# Patient Record
Sex: Male | Born: 1940 | Race: White | Hispanic: No | Marital: Married | Smoking: Never smoker
Health system: Southern US, Community
[De-identification: ages and names within clinical notes are randomized; demographics above are authoritative.]

## PROBLEM LIST (undated history)

## (undated) DIAGNOSIS — M5020 Other cervical disc displacement, unspecified cervical region: Secondary | ICD-10-CM

## (undated) DIAGNOSIS — T4145XA Adverse effect of unspecified anesthetic, initial encounter: Secondary | ICD-10-CM

## (undated) DIAGNOSIS — M545 Low back pain, unspecified: Secondary | ICD-10-CM

## (undated) DIAGNOSIS — G8929 Other chronic pain: Secondary | ICD-10-CM

## (undated) DIAGNOSIS — K219 Gastro-esophageal reflux disease without esophagitis: Secondary | ICD-10-CM

## (undated) DIAGNOSIS — M1990 Unspecified osteoarthritis, unspecified site: Secondary | ICD-10-CM

## (undated) DIAGNOSIS — G43909 Migraine, unspecified, not intractable, without status migrainosus: Secondary | ICD-10-CM

## (undated) DIAGNOSIS — I639 Cerebral infarction, unspecified: Secondary | ICD-10-CM

## (undated) DIAGNOSIS — I629 Nontraumatic intracranial hemorrhage, unspecified: Secondary | ICD-10-CM

## (undated) DIAGNOSIS — I2721 Secondary pulmonary arterial hypertension: Secondary | ICD-10-CM

## (undated) DIAGNOSIS — I4821 Permanent atrial fibrillation: Secondary | ICD-10-CM

## (undated) DIAGNOSIS — T8859XA Other complications of anesthesia, initial encounter: Secondary | ICD-10-CM

## (undated) DIAGNOSIS — I609 Nontraumatic subarachnoid hemorrhage, unspecified: Secondary | ICD-10-CM

## (undated) DIAGNOSIS — IMO0001 Reserved for inherently not codable concepts without codable children: Secondary | ICD-10-CM

## (undated) DIAGNOSIS — I10 Essential (primary) hypertension: Secondary | ICD-10-CM

## (undated) DIAGNOSIS — N200 Calculus of kidney: Secondary | ICD-10-CM

## (undated) DIAGNOSIS — E7800 Pure hypercholesterolemia, unspecified: Secondary | ICD-10-CM

## (undated) DIAGNOSIS — I340 Nonrheumatic mitral (valve) insufficiency: Secondary | ICD-10-CM

## (undated) DIAGNOSIS — H359 Unspecified retinal disorder: Secondary | ICD-10-CM

## (undated) HISTORY — PX: JOINT REPLACEMENT: SHX530

## (undated) HISTORY — DX: Nonrheumatic mitral (valve) insufficiency: I34.0

## (undated) HISTORY — DX: Secondary pulmonary arterial hypertension: I27.21

## (undated) HISTORY — DX: Permanent atrial fibrillation: I48.21

## (undated) HISTORY — PX: CARDIOVASCULAR STRESS TEST: SHX262

## (undated) HISTORY — PX: COLONOSCOPY: SHX174

---
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
## 1979-05-27 HISTORY — PX: RETINAL DETACHMENT SURGERY: SHX105

## 1979-05-27 HISTORY — PX: CATARACT EXTRACTION W/ INTRAOCULAR LENS IMPLANT: SHX1309

## 1979-05-27 HISTORY — PX: EYE MUSCLE SURGERY: SHX370

## 1989-05-26 HISTORY — PX: BLEPHAROPLASTY: SUR158

## 2000-09-25 DIAGNOSIS — I639 Cerebral infarction, unspecified: Secondary | ICD-10-CM

## 2000-09-25 HISTORY — DX: Cerebral infarction, unspecified: I63.9

## 2009-04-05 ENCOUNTER — Ambulatory Visit: Payer: Self-pay | Admitting: Gastroenterology

## 2009-08-25 DIAGNOSIS — I609 Nontraumatic subarachnoid hemorrhage, unspecified: Secondary | ICD-10-CM

## 2009-08-25 HISTORY — DX: Nontraumatic subarachnoid hemorrhage, unspecified: I60.9

## 2009-09-14 ENCOUNTER — Emergency Department: Payer: Self-pay | Admitting: Emergency Medicine

## 2009-09-22 ENCOUNTER — Ambulatory Visit: Payer: Self-pay | Admitting: Family Medicine

## 2010-04-01 ENCOUNTER — Ambulatory Visit: Payer: Self-pay | Admitting: Unknown Physician Specialty

## 2010-04-28 ENCOUNTER — Inpatient Hospital Stay: Payer: Self-pay | Admitting: Unknown Physician Specialty

## 2010-04-28 HISTORY — PX: TOTAL KNEE ARTHROPLASTY: SHX125

## 2011-03-31 ENCOUNTER — Ambulatory Visit: Payer: Self-pay | Admitting: Surgery

## 2011-04-10 ENCOUNTER — Ambulatory Visit: Payer: Self-pay | Admitting: Surgery

## 2011-04-10 HISTORY — PX: INGUINAL HERNIA REPAIR: SUR1180

## 2012-06-17 ENCOUNTER — Emergency Department: Payer: Self-pay | Admitting: Emergency Medicine

## 2012-06-25 ENCOUNTER — Emergency Department: Payer: Self-pay | Admitting: Emergency Medicine

## 2012-07-05 ENCOUNTER — Ambulatory Visit: Payer: Self-pay | Admitting: Orthopedic Surgery

## 2012-07-26 ENCOUNTER — Emergency Department: Payer: Self-pay | Admitting: Emergency Medicine

## 2012-09-09 ENCOUNTER — Encounter (HOSPITAL_COMMUNITY): Payer: Self-pay | Admitting: Pharmacy Technician

## 2012-09-09 ENCOUNTER — Other Ambulatory Visit: Payer: Self-pay | Admitting: Neurosurgery

## 2012-09-12 ENCOUNTER — Encounter (HOSPITAL_COMMUNITY)
Admission: RE | Admit: 2012-09-12 | Discharge: 2012-09-12 | Disposition: A | Discharge status: Home / Self Care | Payer: Medicare Other | Source: Ambulatory Visit | Attending: Neurosurgery | Admitting: Neurosurgery

## 2012-09-12 ENCOUNTER — Encounter (HOSPITAL_COMMUNITY)
Admission: RE | Admit: 2012-09-12 | Discharge: 2012-09-12 | Disposition: A | Discharge status: Home / Self Care | Payer: Medicare Other | Source: Ambulatory Visit | Attending: Anesthesiology | Admitting: Anesthesiology

## 2012-09-12 ENCOUNTER — Encounter (HOSPITAL_COMMUNITY): Payer: Self-pay

## 2012-09-12 HISTORY — DX: Other cervical disc displacement, unspecified cervical region: M50.20

## 2012-09-12 HISTORY — DX: Low back pain, unspecified: M54.50

## 2012-09-12 HISTORY — DX: Gastro-esophageal reflux disease without esophagitis: K21.9

## 2012-09-12 HISTORY — DX: Other chronic pain: G89.29

## 2012-09-12 HISTORY — DX: Nontraumatic intracranial hemorrhage, unspecified: I62.9

## 2012-09-12 HISTORY — DX: Other complications of anesthesia, initial encounter: T88.59XA

## 2012-09-12 HISTORY — DX: Cerebral infarction, unspecified: I63.9

## 2012-09-12 HISTORY — DX: Low back pain: M54.5

## 2012-09-12 HISTORY — DX: Essential (primary) hypertension: I10

## 2012-09-12 HISTORY — DX: Unspecified retinal disorder: H35.9

## 2012-09-12 HISTORY — DX: Calculus of kidney: N20.0

## 2012-09-12 HISTORY — DX: Adverse effect of unspecified anesthetic, initial encounter: T41.45XA

## 2012-09-12 HISTORY — DX: Unspecified osteoarthritis, unspecified site: M19.90

## 2012-09-12 LAB — BASIC METABOLIC PANEL
CO2: 28 mEq/L (ref 19–32)
Chloride: 96 mEq/L (ref 96–112)

## 2012-09-12 LAB — CBC
HCT: 40.6 % (ref 39.0–52.0)
Hemoglobin: 13.7 g/dL (ref 13.0–17.0)
MCV: 93.8 fL (ref 78.0–100.0)
RBC: 4.33 MIL/uL (ref 4.22–5.81)
WBC: 8.1 10*3/uL (ref 4.0–10.5)

## 2012-09-12 LAB — SURGICAL PCR SCREEN
MRSA, PCR: NEGATIVE
Staphylococcus aureus: POSITIVE — AB

## 2012-09-12 LAB — TYPE AND SCREEN

## 2012-09-12 LAB — ABO/RH: ABO/RH(D): O POS

## 2012-09-12 NOTE — Pre-Procedure Instructions (Signed)
19 [name]  09/12/2012   Your procedure is scheduled on: Monday September 16, 2012   Report to [facility] at 7:30 AM.  Call this number if you have problems the morning of surgery: [phone]   Remember:   Do not eat food or drink :After Midnight.    Take these medicines the morning of surgery with A SIP OF WATER: amoxicillin, tegretol, zyrtec, cardura, oxycodone, propranolol, verapamil, TEKTURNA   Do not wear jewelry, make-up or nail polish.  Do not wear lotions, powders, or perfumes.  Do not shave 48 hours prior to surgery. Men may shave face and neck.  Do not bring valuables to the hospital.  Contacts, dentures or bridgework may not be worn into surgery.  Leave suitcase in the car. After surgery it may be brought to your room.  For patients admitted to the hospital, checkout time is 11:00 AM the day of discharge.   Patients discharged the day of surgery will not be allowed to drive home.  Name and phone number of your driver: family / friend  Special Instructions: Shower using CHG 2 nights before surgery and the night before surgery.  If you shower the day of surgery use CHG.  Use special wash - you have one bottle of CHG for all showers.  You should use approximately 1/3 of the bottle for each shower.   Please read over the following fact sheets that you were given: Pain Booklet, Coughing and Deep Breathing, Blood Transfusion Information, MRSA Information and Surgical Site Infection Prevention

## 2012-09-12 NOTE — Progress Notes (Addendum)
Patient states saw Dr. Lady Gary for cardiac workup after "cardiac arrest" after receiving dye for lexiscan.  Discussed with anesthesia.  Contacted Dr. America Brown office [phone], spoke with Byrd Hesselbach, requested all cardiac records for anesthesia review.  Requested anesthesia and post op record from [facility]. Forwarded to anesthesia for review.

## 2012-09-13 ENCOUNTER — Encounter (HOSPITAL_COMMUNITY): Payer: Self-pay | Admitting: Vascular Surgery

## 2012-09-13 NOTE — Consult Note (Signed)
Anesthesia chart reviewed patient is a 71 year old male scheduled for L4-5 PLIF by Dr. Gerlene Fee on 09/16/2012. 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 He also reported that he "arrested" during his stress test at [facility] in 2011--however, when I reviewed records it only reported he was bradycardic without mention of syncope of other symptoms.   For Anesthesia History he reports severe hypotension with spinal anesthesia and hypertension with general anesthesia.  Anesthesia records from his right inguinal hernia surgery on 04/10/11 (done under [facility]) and his left TKR on 04/28/10 (done under spinal) are on his chart for review.  These were both done at [facility].    He was evaluated by Dr. Harold Hedge in July 2011 for pre-operative evaluation for TKR.  He had a nuclear stress test on 04/15/10 that showed bradycardia, normal left ventricular function with EF 56%, normal myocardial perfusion images at rest and peak stress without evidence of ischemia. He was ultimately cleared for knee replacement surgery. PRN cardiology followup was recommended at that time. (He was asymptomatic of his bradycardia at that time.)  However, the patient was seen again on 06/12/2011 for hypertension and hyperlipidemia followup. No changes were made at that time.  EKG on 09/12/12 showed SB @ 48 bpm, moderate voltage criteria for LVH, cannot rule out anteroseptal infarct (age undetermined).  The copy is dark, but from what I can tell, his EKG appears stable since at least 03/31/11 (PCP).  CXR on 09/12/12 showed COPD, no acute cardiopulmonary abnormality.  Pre-operative labs noted.  He will be evaluated by his Anesthesiologist on the day of surgery to discuss the definitive anesthesia plan.  If no significant change in his status then would anticipate he could proceed as planned.  Shonna Chock, PA-C 09/13/12 1143

## 2012-09-15 MED ORDER — CEFAZOLIN SODIUM-DEXTROSE 2-3 GM-% IV SOLR
2.0000 g | INTRAVENOUS | Status: AC
  Administered 2012-09-16: 2 g via INTRAVENOUS
  Filled 2012-09-15: qty 50

## 2012-09-16 ENCOUNTER — Inpatient Hospital Stay (HOSPITAL_COMMUNITY)
Admission: RE | Admit: 2012-09-16 | Discharge: 2012-09-18 | DRG: 460 | Disposition: A | Discharge status: Home / Self Care | Payer: Medicare Other | Source: Ambulatory Visit | Attending: Neurosurgery | Admitting: Neurosurgery

## 2012-09-16 ENCOUNTER — Encounter (HOSPITAL_COMMUNITY): Payer: Self-pay | Admitting: Vascular Surgery

## 2012-09-16 ENCOUNTER — Encounter (HOSPITAL_COMMUNITY): Payer: Self-pay | Admitting: Surgery

## 2012-09-16 ENCOUNTER — Encounter (HOSPITAL_COMMUNITY): Payer: Self-pay | Admitting: Certified Registered Nurse Anesthetist

## 2012-09-16 ENCOUNTER — Encounter (HOSPITAL_COMMUNITY)
Admission: RE | Disposition: A | Discharge status: Home / Self Care | Payer: Self-pay | Source: Ambulatory Visit | Attending: Neurosurgery

## 2012-09-16 ENCOUNTER — Ambulatory Visit (HOSPITAL_COMMUNITY): Payer: Medicare Other

## 2012-09-16 ENCOUNTER — Ambulatory Visit (HOSPITAL_COMMUNITY): Payer: Medicare Other | Admitting: Vascular Surgery

## 2012-09-16 DIAGNOSIS — I10 Essential (primary) hypertension: Secondary | ICD-10-CM | POA: Diagnosis present

## 2012-09-16 DIAGNOSIS — Z7982 Long term (current) use of aspirin: Secondary | ICD-10-CM

## 2012-09-16 DIAGNOSIS — M7130 Other bursal cyst, unspecified site: Secondary | ICD-10-CM | POA: Diagnosis present

## 2012-09-16 DIAGNOSIS — K219 Gastro-esophageal reflux disease without esophagitis: Secondary | ICD-10-CM | POA: Diagnosis present

## 2012-09-16 DIAGNOSIS — M48062 Spinal stenosis, lumbar region with neurogenic claudication: Secondary | ICD-10-CM

## 2012-09-16 DIAGNOSIS — Z79899 Other long term (current) drug therapy: Secondary | ICD-10-CM

## 2012-09-16 DIAGNOSIS — Z8673 Personal history of transient ischemic attack (TIA), and cerebral infarction without residual deficits: Secondary | ICD-10-CM

## 2012-09-16 DIAGNOSIS — M4310 Spondylolisthesis, site unspecified: Secondary | ICD-10-CM | POA: Diagnosis present

## 2012-09-16 DIAGNOSIS — M48061 Spinal stenosis, lumbar region without neurogenic claudication: Principal | ICD-10-CM | POA: Diagnosis present

## 2012-09-16 DIAGNOSIS — Z01812 Encounter for preprocedural laboratory examination: Secondary | ICD-10-CM

## 2012-09-16 HISTORY — DX: Migraine, unspecified, not intractable, without status migrainosus: G43.909

## 2012-09-16 HISTORY — PX: POSTERIOR LUMBAR FUSION: SHX6036

## 2012-09-16 HISTORY — DX: Pure hypercholesterolemia, unspecified: E78.00

## 2012-09-16 HISTORY — DX: Reserved for inherently not codable concepts without codable children: IMO0001

## 2012-09-16 SURGERY — POSTERIOR LUMBAR FUSION 1 LEVEL
Anesthesia: General | Site: Spine Lumbar | Wound class: Clean

## 2012-09-16 MED ORDER — SURGIFOAM 100 EX MISC
CUTANEOUS | Status: DC | PRN
  Administered 2012-09-16: 11:00:00 via TOPICAL

## 2012-09-16 MED ORDER — DIPHENHYDRAMINE HCL 25 MG PO CAPS
25.0000 mg | ORAL_CAPSULE | Freq: Three times a day (TID) | ORAL | Status: DC | PRN
  Administered 2012-09-16: 25 mg via ORAL
  Filled 2012-09-16: qty 1

## 2012-09-16 MED ORDER — ALBUMIN HUMAN 5 % IV SOLN
INTRAVENOUS | Status: DC | PRN
  Administered 2012-09-16: 12:00:00 via INTRAVENOUS

## 2012-09-16 MED ORDER — BUPIVACAINE HCL (PF) 0.5 % IJ SOLN
INTRAMUSCULAR | Status: DC | PRN
  Administered 2012-09-16: 20 mL

## 2012-09-16 MED ORDER — ROCURONIUM BROMIDE 100 MG/10ML IV SOLN
INTRAVENOUS | Status: DC | PRN
  Administered 2012-09-16: 50 mg via INTRAVENOUS

## 2012-09-16 MED ORDER — HYDROMORPHONE HCL PF 1 MG/ML IJ SOLN
0.2500 mg | INTRAMUSCULAR | Status: DC | PRN
  Administered 2012-09-16 (×2): 0.25 mg via INTRAVENOUS

## 2012-09-16 MED ORDER — ACETAMINOPHEN 325 MG PO TABS
650.0000 mg | ORAL_TABLET | ORAL | Status: DC | PRN
  Administered 2012-09-18: 650 mg via ORAL
  Filled 2012-09-16: qty 2

## 2012-09-16 MED ORDER — FUROSEMIDE 20 MG PO TABS
20.0000 mg | ORAL_TABLET | Freq: Every morning | ORAL | Status: DC
  Administered 2012-09-17 – 2012-09-18 (×2): 20 mg via ORAL
  Filled 2012-09-16 (×2): qty 1

## 2012-09-16 MED ORDER — WHITE PETROLATUM GEL
Status: AC
  Filled 2012-09-16: qty 5

## 2012-09-16 MED ORDER — ALUM & MAG HYDROXIDE-SIMETH 200-200-20 MG/5ML PO SUSP: 30.0000 mL | Freq: Four times a day (QID) | ORAL | Status: DC | PRN

## 2012-09-16 MED ORDER — ONDANSETRON HCL 4 MG/2ML IJ SOLN: 4.0000 mg | Freq: Four times a day (QID) | INTRAMUSCULAR | Status: DC | PRN

## 2012-09-16 MED ORDER — DEXAMETHASONE SODIUM PHOSPHATE 10 MG/ML IJ SOLN
10.0000 mg | INTRAMUSCULAR | Status: AC
  Administered 2012-09-16: 10 mg via INTRAVENOUS
  Filled 2012-09-16: qty 1

## 2012-09-16 MED ORDER — SODIUM CHLORIDE 0.9 % IV SOLN
INTRAVENOUS | Status: AC
  Filled 2012-09-16: qty 500

## 2012-09-16 MED ORDER — VERAPAMIL HCL 120 MG PO TABS
120.0000 mg | ORAL_TABLET | ORAL | Status: DC
  Administered 2012-09-16 – 2012-09-18 (×6): 120 mg via ORAL
  Filled 2012-09-16 (×9): qty 1

## 2012-09-16 MED ORDER — ALISKIREN FUMARATE 150 MG PO TABS
300.0000 mg | ORAL_TABLET | ORAL | Status: DC
  Administered 2012-09-17 – 2012-09-18 (×2): 300 mg via ORAL
  Filled 2012-09-16 (×3): qty 2

## 2012-09-16 MED ORDER — SODIUM CHLORIDE 0.9 % IJ SOLN: 3.0000 mL | INTRAMUSCULAR | Status: DC | PRN

## 2012-09-16 MED ORDER — ACETAMINOPHEN 650 MG RE SUPP: 650.0000 mg | RECTAL | Status: DC | PRN

## 2012-09-16 MED ORDER — CYCLOBENZAPRINE HCL 10 MG PO TABS: 10.0000 mg | ORAL_TABLET | Freq: Three times a day (TID) | ORAL | Status: DC | PRN

## 2012-09-16 MED ORDER — VECURONIUM BROMIDE 10 MG IV SOLR
INTRAVENOUS | Status: DC | PRN
  Administered 2012-09-16: 2 mg via INTRAVENOUS
  Administered 2012-09-16: 1 mg via INTRAVENOUS
  Administered 2012-09-16 (×2): 2 mg via INTRAVENOUS
  Administered 2012-09-16 (×2): 1 mg via INTRAVENOUS

## 2012-09-16 MED ORDER — CEFAZOLIN SODIUM-DEXTROSE 2-3 GM-% IV SOLR
2.0000 g | Freq: Three times a day (TID) | INTRAVENOUS | Status: AC
  Administered 2012-09-16 – 2012-09-17 (×2): 2 g via INTRAVENOUS
  Filled 2012-09-16 (×2): qty 50

## 2012-09-16 MED ORDER — BACITRACIN 50000 UNITS IM SOLR
INTRAMUSCULAR | Status: AC
  Filled 2012-09-16: qty 1

## 2012-09-16 MED ORDER — IRBESARTAN 75 MG PO TABS
75.0000 mg | ORAL_TABLET | Freq: Every day | ORAL | Status: DC
  Administered 2012-09-16 – 2012-09-18 (×3): 75 mg via ORAL
  Filled 2012-09-16 (×4): qty 1

## 2012-09-16 MED ORDER — SODIUM CHLORIDE 0.9 % IR SOLN
Status: DC | PRN
  Administered 2012-09-16: 11:00:00

## 2012-09-16 MED ORDER — SODIUM CHLORIDE 0.9 % IJ SOLN
3.0000 mL | Freq: Two times a day (BID) | INTRAMUSCULAR | Status: DC
  Administered 2012-09-16 – 2012-09-18 (×3): 3 mL via INTRAVENOUS

## 2012-09-16 MED ORDER — HYDROMORPHONE HCL PF 1 MG/ML IJ SOLN
INTRAMUSCULAR | Status: AC
  Filled 2012-09-16: qty 1

## 2012-09-16 MED ORDER — LACTATED RINGERS IV SOLN
INTRAVENOUS | Status: DC | PRN
  Administered 2012-09-16 (×3): via INTRAVENOUS

## 2012-09-16 MED ORDER — MENTHOL 3 MG MT LOZG: 1.0000 | LOZENGE | OROMUCOSAL | Status: DC | PRN

## 2012-09-16 MED ORDER — ZOLPIDEM TARTRATE 5 MG PO TABS: 5.0000 mg | ORAL_TABLET | Freq: Every evening | ORAL | Status: DC | PRN

## 2012-09-16 MED ORDER — GLYCOPYRROLATE 0.2 MG/ML IJ SOLN
INTRAMUSCULAR | Status: DC | PRN
  Administered 2012-09-16: 0.6 mg via INTRAVENOUS
  Administered 2012-09-16: 0.2 mg via INTRAVENOUS

## 2012-09-16 MED ORDER — DOXAZOSIN MESYLATE 1 MG PO TABS
1.0000 mg | ORAL_TABLET | Freq: Every evening | ORAL | Status: DC
  Administered 2012-09-16: 1 mg via ORAL
  Filled 2012-09-16 (×3): qty 1

## 2012-09-16 MED ORDER — ARTIFICIAL TEARS OP OINT
TOPICAL_OINTMENT | OPHTHALMIC | Status: DC | PRN
  Administered 2012-09-16: 1 via OPHTHALMIC

## 2012-09-16 MED ORDER — OXYCODONE HCL 5 MG PO TABS: 5.0000 mg | ORAL_TABLET | Freq: Once | ORAL | Status: DC | PRN

## 2012-09-16 MED ORDER — AZELASTINE HCL 0.1 % NA SOLN
2.0000 | Freq: Two times a day (BID) | NASAL | Status: DC | PRN
  Administered 2012-09-16: 2 via NASAL
  Filled 2012-09-16: qty 30

## 2012-09-16 MED ORDER — KCL IN DEXTROSE-NACL 20-5-0.45 MEQ/L-%-% IV SOLN
80.0000 mL/h | INTRAVENOUS | Status: DC
  Administered 2012-09-16: 80 mL/h via INTRAVENOUS
  Filled 2012-09-16 (×5): qty 1000

## 2012-09-16 MED ORDER — PROPRANOLOL HCL 40 MG PO TABS
40.0000 mg | ORAL_TABLET | Freq: Three times a day (TID) | ORAL | Status: DC
  Administered 2012-09-16 – 2012-09-18 (×6): 40 mg via ORAL
  Filled 2012-09-16 (×9): qty 1

## 2012-09-16 MED ORDER — PHENOL 1.4 % MT LIQD: 1.0000 | OROMUCOSAL | Status: DC | PRN

## 2012-09-16 MED ORDER — OXYCODONE HCL 5 MG/5ML PO SOLN: 5.0000 mg | Freq: Once | ORAL | Status: DC | PRN

## 2012-09-16 MED ORDER — LIDOCAINE HCL (CARDIAC) 20 MG/ML IV SOLN
INTRAVENOUS | Status: DC | PRN
  Administered 2012-09-16: 70 mg via INTRAVENOUS

## 2012-09-16 MED ORDER — PROPOFOL 10 MG/ML IV BOLUS
INTRAVENOUS | Status: DC | PRN
  Administered 2012-09-16: 100 mg via INTRAVENOUS

## 2012-09-16 MED ORDER — EPHEDRINE SULFATE 50 MG/ML IJ SOLN
INTRAMUSCULAR | Status: DC | PRN
  Administered 2012-09-16 (×3): 15 mg via INTRAVENOUS

## 2012-09-16 MED ORDER — 0.9 % SODIUM CHLORIDE (POUR BTL) OPTIME
TOPICAL | Status: DC | PRN
  Administered 2012-09-16: 1000 mL

## 2012-09-16 MED ORDER — NEOSTIGMINE METHYLSULFATE 1 MG/ML IJ SOLN
INTRAMUSCULAR | Status: DC | PRN
  Administered 2012-09-16: 4 mg via INTRAVENOUS

## 2012-09-16 MED ORDER — HYDROMORPHONE HCL PF 1 MG/ML IJ SOLN
1.0000 mg | INTRAMUSCULAR | Status: DC | PRN
  Administered 2012-09-16 (×2): 1 mg via INTRAMUSCULAR
  Administered 2012-09-16: 1.5 mg via INTRAMUSCULAR
  Filled 2012-09-16: qty 1
  Filled 2012-09-16: qty 2
  Filled 2012-09-16: qty 1

## 2012-09-16 MED ORDER — PANTOPRAZOLE SODIUM 40 MG IV SOLR
40.0000 mg | Freq: Every day | INTRAVENOUS | Status: DC
  Administered 2012-09-16: 40 mg via INTRAVENOUS
  Filled 2012-09-16 (×2): qty 40

## 2012-09-16 MED ORDER — CARBAMAZEPINE 200 MG PO TABS
200.0000 mg | ORAL_TABLET | Freq: Every morning | ORAL | Status: DC
  Administered 2012-09-17 – 2012-09-18 (×2): 200 mg via ORAL
  Filled 2012-09-16 (×2): qty 1

## 2012-09-16 MED ORDER — SIMVASTATIN 20 MG PO TABS
20.0000 mg | ORAL_TABLET | ORAL | Status: DC
Start: 2012-09-16 — End: 2012-09-18
  Administered 2012-09-16 – 2012-09-18 (×4): 20 mg via ORAL
  Filled 2012-09-16 (×6): qty 1

## 2012-09-16 MED ORDER — MIDAZOLAM HCL 5 MG/5ML IJ SOLN
INTRAMUSCULAR | Status: DC | PRN
  Administered 2012-09-16: 2 mg via INTRAVENOUS

## 2012-09-16 MED ORDER — ONDANSETRON HCL 4 MG/2ML IJ SOLN: 4.0000 mg | INTRAMUSCULAR | Status: DC | PRN

## 2012-09-16 MED ORDER — MORPHINE SULFATE 10 MG/ML IJ SOLN
INTRAMUSCULAR | Status: DC | PRN
  Administered 2012-09-16 (×2): 2 mg via INTRAVENOUS

## 2012-09-16 MED ORDER — HYDROCODONE-ACETAMINOPHEN 5-325 MG PO TABS
1.0000 | ORAL_TABLET | ORAL | Status: DC | PRN
  Administered 2012-09-17 – 2012-09-18 (×7): 2 via ORAL
  Filled 2012-09-16 (×7): qty 2

## 2012-09-16 MED ORDER — SODIUM CHLORIDE 0.9 % IV SOLN: 250.0000 mL | INTRAVENOUS | Status: DC

## 2012-09-16 MED ORDER — DEXAMETHASONE SODIUM PHOSPHATE 10 MG/ML IJ SOLN: INTRAMUSCULAR | Status: DC | PRN

## 2012-09-16 MED ORDER — ONDANSETRON HCL 4 MG/2ML IJ SOLN
INTRAMUSCULAR | Status: DC | PRN
  Administered 2012-09-16: 4 mg via INTRAVENOUS

## 2012-09-16 SURGICAL SUPPLY — 65 items
BAG DECANTER FOR FLEXI CONT (MISCELLANEOUS) ×2 IMPLANT
BENZOIN TINCTURE PRP APPL 2/3 (GAUZE/BANDAGES/DRESSINGS) ×4 IMPLANT
BLADE SURG ROTATE 9660 (MISCELLANEOUS) ×2 IMPLANT
BONE EQUIVA 5CC (Bone Implant) ×4 IMPLANT
BRUSH SCRUB EZ PLAIN DRY (MISCELLANEOUS) ×2 IMPLANT
CANISTER SUCTION 2500CC (MISCELLANEOUS) ×2 IMPLANT
CLOTH BEACON ORANGE TIMEOUT ST (SAFETY) ×2 IMPLANT
CONT SPEC 4OZ CLIKSEAL STRL BL (MISCELLANEOUS) ×6 IMPLANT
COVER BACK TABLE 24X17X13 BIG (DRAPES) IMPLANT
COVER TABLE BACK 60X90 (DRAPES) ×2 IMPLANT
DERMABOND ADHESIVE PROPEN (GAUZE/BANDAGES/DRESSINGS) ×2
DERMABOND ADVANCED (GAUZE/BANDAGES/DRESSINGS)
DERMABOND ADVANCED .7 DNX12 (GAUZE/BANDAGES/DRESSINGS) IMPLANT
DERMABOND ADVANCED .7 DNX6 (GAUZE/BANDAGES/DRESSINGS) ×2 IMPLANT
DRAPE C-ARM 42X72 X-RAY (DRAPES) ×6 IMPLANT
DRAPE LAPAROTOMY 100X72X124 (DRAPES) ×2 IMPLANT
DRAPE SURG 17X23 STRL (DRAPES) ×4 IMPLANT
DRESSING TELFA 8X3 (GAUZE/BANDAGES/DRESSINGS) IMPLANT
ELECT REM PT RETURN 9FT ADLT (ELECTROSURGICAL) ×2
ELECTRODE REM PT RTRN 9FT ADLT (ELECTROSURGICAL) ×1 IMPLANT
EVACUATOR 1/8 PVC DRAIN (DRAIN) ×2 IMPLANT
GAUZE SPONGE 4X4 16PLY XRAY LF (GAUZE/BANDAGES/DRESSINGS) IMPLANT
GLOVE BIO SURGEON STRL SZ 6.5 (GLOVE) ×2 IMPLANT
GLOVE BIOGEL PI IND STRL 7.0 (GLOVE) ×5 IMPLANT
GLOVE BIOGEL PI INDICATOR 7.0 (GLOVE) ×5
GLOVE ECLIPSE 6.5 STRL STRAW (GLOVE) ×2 IMPLANT
GLOVE ECLIPSE 7.5 STRL STRAW (GLOVE) ×6 IMPLANT
GLOVE EXAM NITRILE LRG STRL (GLOVE) IMPLANT
GLOVE EXAM NITRILE MD LF STRL (GLOVE) IMPLANT
GLOVE EXAM NITRILE XS STR PU (GLOVE) IMPLANT
GLOVE SS BIOGEL STRL SZ 6.5 (GLOVE) ×4 IMPLANT
GLOVE SUPERSENSE BIOGEL SZ 6.5 (GLOVE) ×4
GOWN BRE IMP SLV AUR LG STRL (GOWN DISPOSABLE) ×8 IMPLANT
GOWN BRE IMP SLV AUR XL STRL (GOWN DISPOSABLE) ×4 IMPLANT
GOWN STRL REIN 2XL LVL4 (GOWN DISPOSABLE) IMPLANT
KIT BASIN OR (CUSTOM PROCEDURE TRAY) ×2 IMPLANT
KIT ROOM TURNOVER OR (KITS) ×2 IMPLANT
LUCENT STRAIGHT 10X22X10 (Set) ×4 IMPLANT
NEEDLE HYPO 22GX1.5 SAFETY (NEEDLE) ×2 IMPLANT
NS IRRIG 1000ML POUR BTL (IV SOLUTION) ×2 IMPLANT
PACK LAMINECTOMY NEURO (CUSTOM PROCEDURE TRAY) ×2 IMPLANT
PAD ARMBOARD 7.5X6 YLW CONV (MISCELLANEOUS) ×10 IMPLANT
PATTIES SURGICAL .75X.75 (GAUZE/BANDAGES/DRESSINGS) IMPLANT
ROD PATHFINDER 40MM (Rod) ×2 IMPLANT
ROD PATHFINDER PERC .45MM (Rod) ×2 IMPLANT
SCREW MIN INVASIVE 6.5X45 (Screw) ×4 IMPLANT
SCREW POLYAXIA MIS 6.5X40MM (Screw) ×4 IMPLANT
SPECIMEN JAR SMALL (MISCELLANEOUS) ×2 IMPLANT
SPONGE GAUZE 4X4 12PLY (GAUZE/BANDAGES/DRESSINGS) ×2 IMPLANT
SPONGE LAP 4X18 X RAY DECT (DISPOSABLE) IMPLANT
SPONGE SURGIFOAM ABS GEL 100 (HEMOSTASIS) ×2 IMPLANT
STRIP CLOSURE SKIN 1/2X4 (GAUZE/BANDAGES/DRESSINGS) ×4 IMPLANT
SUT VIC AB 0 CT1 18XCR BRD8 (SUTURE) ×1 IMPLANT
SUT VIC AB 0 CT1 8-18 (SUTURE) ×1
SUT VIC AB 2-0 OS6 18 (SUTURE) ×6 IMPLANT
SUT VIC AB 3-0 CP2 18 (SUTURE) ×2 IMPLANT
SYR 20ML ECCENTRIC (SYRINGE) ×2 IMPLANT
TOOL FLUTED BALL 7MM (MISCELLANEOUS) ×2 IMPLANT
TOOL MATCHSTK 3MM (MISCELLANEOUS) ×2 IMPLANT
TOP CLSR SEQUOIA (Orthopedic Implant) ×8 IMPLANT
TOWEL OR 17X24 6PK STRL BLUE (TOWEL DISPOSABLE) ×2 IMPLANT
TOWEL OR 17X26 10 PK STRL BLUE (TOWEL DISPOSABLE) ×2 IMPLANT
TRAP SPECIMEN MUCOUS 40CC (MISCELLANEOUS) ×2 IMPLANT
TRAY FOLEY CATH 14FRSI W/METER (CATHETERS) ×2 IMPLANT
WATER STERILE IRR 1000ML POUR (IV SOLUTION) ×2 IMPLANT

## 2012-09-16 NOTE — Transfer of Care (Signed)
Immediate Anesthesia Transfer of Care Note  Patient: [name]  Procedure(s) Performed: Procedure(s) (LRB) with comments: POSTERIOR LUMBAR FUSION 1 LEVEL (N/A) - Lumbar Four-Five, Posterior Lumbar Interbody Fusion with Pedicle Screws  Patient Location: PACU  Anesthesia Type:General  Level of Consciousness: sedated  Airway & Oxygen Therapy: Patient Spontanous Breathing and Patient connected to nasal cannula oxygen  Post-op Assessment: Report given to PACU RN and Post -op Vital signs reviewed and stable  Post vital signs: Reviewed and stable  Complications: No apparent anesthesia complications

## 2012-09-16 NOTE — Anesthesia Preprocedure Evaluation (Addendum)
Anesthesia Evaluation  Patient identified by MRN, date of birth, ID band Patient awake    Reviewed: Allergy & Precautions, H&P , NPO status , Patient's Chart, lab work & pertinent test results  Airway Mallampati: II  Neck ROM: full    Dental  (+) Teeth Intact and Dental Advisory Given   Pulmonary          Cardiovascular hypertension,     Neuro/Psych  Headaches, Seizures -,     GI/Hepatic GERD-  ,  Endo/Other    Renal/GU      Musculoskeletal  (+) Arthritis -,   Abdominal   Peds  Hematology   Anesthesia Other Findings   Reproductive/Obstetrics                          Anesthesia Physical Anesthesia Plan  ASA: III  Anesthesia Plan: General   Post-op Pain Management:    Induction: Intravenous  Airway Management Planned: Oral ETT  Additional Equipment:   Intra-op Plan:   Post-operative Plan: Extubation in OR  Informed Consent: I have reviewed the patients History and Physical, chart, labs and discussed the procedure including the risks, benefits and alternatives for the proposed anesthesia with the patient or authorized representative who has indicated his/her understanding and acceptance.     Plan Discussed with: CRNA and Surgeon  Anesthesia Plan Comments:         Anesthesia Quick Evaluation

## 2012-09-16 NOTE — Preoperative (Addendum)
Beta Blockers   Reason not to administer Beta Blockers:Not Applicable 

## 2012-09-16 NOTE — H&P (Signed)
[name] is an 71 y.o. male.   Chief Complaint: Back and right greater than left leg pain HPI: The patient is a 71 year old gentleman who presented with back and bilateral leg pain worse on the right than the left. He was tried on aggressive conservative therapy without improvement. He underwent an MRI scan of his lumbar spine which showed spondylolisthesis and stenosis at L4-5. After failing further conservative therapy and discussing the options the patient requested surgery and now comes for an L4-5 decompression with interbody fusion and pedicle screw fixation. I've had a long discussion with him regarding the risks and benefits of surgical intervention. The risks discussed include but are not limited to bleeding infection weakness numbness paralysis 12 instrumentation nonunion spinal fluid leakage coma and death. We have discussed alternative methods of therapy offered risks and benefits of nonintervention. He's had the opportunity to ask numerous questions and appears to understand. With this information in hand he has requested that we proceed with surgery.  Past Medical History  Diagnosis Date  . Stroke     2002  . Hypertension     sees Dr. Jerl Mina, [facility] [place]  . Kidney stones     hx of  . GERD (gastroesophageal reflux disease)   . Headache     migranies, hx of  . Arthritis   . Chronic low back pain     hx of  . Cataract     right eye  . Retina disorder     hx of torn retina  . Seizures     "last seizures in the 1980's, controlled with tegratol  . Herniated disc, cervical   . Intracranial bleed     december 2010  . Complication of anesthesia     "cardiac arrest with dye from lexiscan [04/15/10 records indicate bradycardia, no ischemia, EF 56%], unstable BP with anesthesia[    Past Surgical History  Procedure Date  . Hernia repair     04/10/2011  . Joint replacement     left knee, 04/28/10  . Colonoscopy     06/26/09  . Blepharoplasty     right  eye, also had  "muscle surgery to eye"  . Cardiovascular stress test     nl perfusion w/o ischemia, EF 56%, bradycardia without syncope 04/15/10 (Dr. Harold Hedge)    History reviewed. No pertinent family history. Social History:  reports that he has never smoked. He does not have any smokeless tobacco history on file. He reports that he does not drink alcohol or use illicit drugs.  Allergies:  Allergies  Allergen Reactions  . Lexiscan (Regadenoson)     Created cardiac arrest  . Codeine Nausea Only  . Fentanyl     Given before knee surgery, blood pressure became extremely low    Medications Prior to Admission  Medication Sig Dispense Refill  . aliskiren (TEKTURNA) 300 MG tablet Take 300 mg by mouth every morning. Time-specific, takes at 0900.      Marland Kitchen amoxicillin (AMOXIL) 500 MG capsule Take 2,000 mg by mouth once as needed. Takes 4 capsules 30 minutes prior to dental/surgical procedures.      Marland Kitchen aspirin 325 MG tablet Take 325 mg by mouth every evening. Time-specific, takes at 1700.      . carbamazepine (TEGRETOL) 200 MG tablet Take 200 mg by mouth every morning. Time-specific, takes at 0900.      . cetirizine (ZYRTEC) 10 MG tablet Take 10 mg by mouth daily.      Marland Kitchen  doxazosin (CARDURA) 1 MG tablet Take 1 mg by mouth every evening. Time-specific, takes at 1700.      . furosemide (LASIX) 20 MG tablet Take 20 mg by mouth every morning. Time-specific, takes at 0900.      . ibuprofen (ADVIL,MOTRIN) 200 MG tablet Take 200 mg by mouth every 4 (four) hours as needed. For pain.  Takes along with scheduled Percocet dose.      . oxyCODONE-acetaminophen (PERCOCET/ROXICET) 5-325 MG per tablet Take 1 tablet by mouth every 4 (four) hours.      . pantoprazole (PROTONIX) 40 MG tablet Take 40 mg by mouth every evening. Time-specific, takes at 1700.      Marland Kitchen propranolol (INDERAL) 80 MG tablet Take 40 mg by mouth 3 (three) times daily. Time-specific, takes at 0900, 1700, and 2300.      . simvastatin (ZOCOR) 20  MG tablet Take 20 mg by mouth 2 (two) times daily. Time-specific, takes at 0900 and 1700.      . valsartan (DIOVAN) 320 MG tablet Take 320 mg by mouth every morning. Time-specific, takes at 0900.      Marland Kitchen verapamil (CALAN) 120 MG tablet Take 120 mg by mouth 3 (three) times daily. Time-specific, takes at 0900, 1700, and 2300.        No results found for this or any previous visit (from the past 48 hour(s)). No results found.  Review of systems not obtained due to patient factors.  Blood pressure 129/64, pulse 45, temperature 97.7 F (36.5 C), temperature source Oral, resp. rate 18, SpO2 97.00%.  The patient is awake alert and oriented. His no facial asymmetry. His gait is slow mildly antalgic. Strength is mildly decreased of extensor pollicis longus for sensation is intact Assessment/Plan Impression is that of stenosis and listhesis at L4-5 the plan is for an L4-5 decompression with posterior lumbar interbody fusion and instrumentation.  Reinaldo Meeker, MD 09/16/2012, 10:07 AM

## 2012-09-16 NOTE — Op Note (Signed)
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 Localizing fluoroscopy was used prior to incision to identify the appropriate level. Midline incision was made above the spinous processes of L3-L4 and L5. Using Bovie cutting current the incision was carried on the spinous processes. Set periosteal dissection was then carried out bilaterally on the spinous processes lamina facet joint and the far lateral region to identify the transverse processes of L4 and L5. Self-retaining tract was placed for exposure and x-ray showed approach the appropriate level. Spinous processes of L4 and L5 were removed. Using the high-speed drill the inferior 80% of the L4 lamina the medial three quarters of the facet joint the superior one third of the L5 lamina were removed. Residual bone and ligamentum flavum removed in a piecemeal fashion. Similar decompression was carried on the opposite side. When this was complete residual bone and ligamentum flavum removed in a piecemeal fashion. Large synovial cyst was identified on the right attached the dura this was dissected free without difficulty and then removed in a piecemeal fashion. At this time the L5 nerve roots the thecal sac were found to be well decompressed. We then incised a 15 blade and thoroughly cleaned out with pituitary rongeurs and curettes. We then placed pedicle screws bilaterally at L4-5 with Pathfinder system. We used a drill hole entry points and then passed the pedicle all through the pedicle from  lateral to medial direction. We then tapped with the 6.0 mm tap and placed 6.5 x 45 mm screws at L. 4 and 6.5 x 40 mm screws at L5. We then did interbody fusion in standard fashion. We distracted the disc space up to a 10 mm size and used a rotating cutter. We then impacted 10 mm cage filled with autologous bone morselized allograft. We followed this in excellent position. At the same thing on the opposite side. Prior to placing the second spacer mixture of autologous bone morselized allograft was placed the midline to help with interbody fusion. High-speed drill was then used to decorticate the far lateral region for posterolateral fusion was performed with autologous bone morselized allograft. We then chose appropriate length rods and secured them to stop the screws and did tightening and final tightening with torque and counter torque. We then placed Gelfoam over the dura and then placed an epidural drain which was brought through separate stab incision. Final fluoroscopy with excellent in AP lateral direction. The was then closed in multiple layers of Vicryl on the muscle fascia subcutaneous and subcuticular tissues. Dermabond Steri-Strips were placed on the skin. Shortness was then applied the patient was extubated and covered stable condition.

## 2012-09-16 NOTE — Anesthesia Postprocedure Evaluation (Signed)
Anesthesia Post Note  Patient: [name]  Procedure(s) Performed: Procedure(s) (LRB): POSTERIOR LUMBAR FUSION 1 LEVEL (N/A)  Anesthesia type: General  Patient location: PACU  Post pain: Pain level controlled and Adequate analgesia  Post assessment: Post-op Vital signs reviewed, Patient's Cardiovascular Status Stable, Respiratory Function Stable, Patent Airway and Pain level controlled  Last Vitals:  Filed Vitals:   09/16/12 1449  BP:   Pulse: 67  Temp: 36.2 C  Resp: 14    Post vital signs: Reviewed and stable  Level of consciousness: awake, alert  and oriented  Complications: No apparent anesthesia complications

## 2012-09-16 NOTE — Progress Notes (Signed)
Pt. Very sleepy, wakes up and states his back hurts then goes back to sleep, DIlaudid given , when asked if it helped he stated he didn't know and went back to sleep.

## 2012-09-17 ENCOUNTER — Encounter (HOSPITAL_COMMUNITY): Payer: Self-pay | Admitting: General Practice

## 2012-09-17 MED ORDER — PANTOPRAZOLE SODIUM 40 MG PO TBEC
40.0000 mg | DELAYED_RELEASE_TABLET | Freq: Every day | ORAL | Status: DC
  Administered 2012-09-17 – 2012-09-18 (×2): 40 mg via ORAL
  Filled 2012-09-17: qty 1

## 2012-09-17 NOTE — Progress Notes (Signed)
Patient ID: [name], male   DOB: [birth year], 71 y.o.   MRN: [id number] Afebrile, VSS No leg pain. Increasing activity.  Plan is for d/c tomorrow if continues to do well.

## 2012-09-17 NOTE — Progress Notes (Signed)
Pt ambulating with walker , brace and person assist  hemo vac still draining a lot   Total output 180 this shift endorsed to 2nd shift Rn to D/c hemo vac per MD order if drainage is decreased.

## 2012-09-17 NOTE — Progress Notes (Signed)
UR completed 

## 2012-09-18 MED ORDER — OXYCODONE HCL 5 MG PO TABS: 15.0000 mg | ORAL_TABLET | ORAL | Status: DC | PRN

## 2012-09-18 NOTE — Discharge Summary (Signed)
Physician Discharge Summary  Patient ID: [name] MRN: [id number] DOB/AGE: Apr 03, 1941 71 y.o.  Admit date: 09/16/2012 Discharge date: 09/18/2012  Admission Diagnoses:  Discharge Diagnoses:  Active Problems:  * No active hospital problems. *    Discharged Condition: good  Hospital Course: Surgery Monday with lumbar fusion. Did well. Pain much better. Increased activity without difficulty. Wound healing fine. Home POD 2, specific instructions given.  Consults: None  Significant Diagnostic Studies: none  Treatments: surgery: L 45 plif  Discharge Exam: Blood pressure 159/70, pulse 64, temperature 98.3 F (36.8 C), temperature source Oral, resp. rate 18, height 6" (0.152 m), weight 97.523 kg (215 lb), SpO2 100.00%. Incision/Wound:healing well.  Disposition: Final discharge disposition not confirmed  Discharge Orders    Future Orders Please Complete By Expires   Diet general      Discharge instructions      Comments:   Mostly bedrest. Get up 9 or 10 times each day and walk for 15-20 minutes each time. Very little sitting the first week. No riding in the car until your first post op appointment. If you had neck surgery...may shower from the chest down. If you had low back surgery....you may shower with a saran wrap covering over the incision. Take your pain medicine as needed...and other medicines that you are instructed to take. Call for an appointment...[phone].   Call MD for:  temperature >100.4      Call MD for:  persistant nausea and vomiting      Call MD for:  severe uncontrolled pain      Call MD for:  redness, tenderness, or signs of infection (pain, swelling, redness, odor or green/yellow discharge around incision site)      Call MD for:  difficulty breathing, headache or visual disturbances      Call MD for:  hives          Medication List     As of 09/18/2012 10:37 AM    STOP taking these medications         amoxicillin 500 MG capsule   Commonly  known as: AMOXIL      aspirin 325 MG tablet      ibuprofen 200 MG tablet   Commonly known as: ADVIL,MOTRIN      oxyCODONE-acetaminophen 5-325 MG per tablet   Commonly known as: PERCOCET/ROXICET      TAKE these medications         aliskiren 300 MG tablet   Commonly known as: TEKTURNA   Take 300 mg by mouth every morning. Time-specific, takes at 0900.      carbamazepine 200 MG tablet   Commonly known as: TEGRETOL   Take 200 mg by mouth every morning. Time-specific, takes at 0900.      cetirizine 10 MG tablet   Commonly known as: ZYRTEC   Take 10 mg by mouth daily.      doxazosin 1 MG tablet   Commonly known as: CARDURA   Take 1 mg by mouth every evening. Time-specific, takes at 1700.      furosemide 20 MG tablet   Commonly known as: LASIX   Take 20 mg by mouth every morning. Time-specific, takes at 0900.      oxyCODONE 5 MG immediate release tablet   Commonly known as: Oxy IR/ROXICODONE   Take 3 tablets (15 mg total) by mouth every 4 (four) hours as needed for pain.      pantoprazole 40 MG tablet   Commonly known as: PROTONIX  Take 40 mg by mouth every evening. Time-specific, takes at 1700.      propranolol 80 MG tablet   Commonly known as: INDERAL   Take 40 mg by mouth 3 (three) times daily. Time-specific, takes at 0900, 1700, and 2300.      simvastatin 20 MG tablet   Commonly known as: ZOCOR   Take 20 mg by mouth 2 (two) times daily. Time-specific, takes at 0900 and 1700.      verapamil 120 MG tablet   Commonly known as: CALAN   Take 120 mg by mouth 3 (three) times daily. Time-specific, takes at 0900, 1700, and 2300.      ASK your doctor about these medications         valsartan 320 MG tablet   Commonly known as: DIOVAN   Take 320 mg by mouth every morning. Time-specific, takes at 0900.         At home rest most of the time. Get up 9 or 10 times each day and take a 15 or 20 minute walk. No riding in the car and to your first postoperative appointment.  If you have neck surgery you may shower from the chest down starting on the third postoperative day. If you had back surgery he may start showering on the third postoperative day with saran wrap wrapped around your incisional area 3 times. After the shower remove the saran wrap. Take pain medicine as needed and other medications as instructed. Call my office for an appointment.  SignedReinaldo Meeker, MD 09/18/2012, 10:37 AM

## 2012-09-18 NOTE — Progress Notes (Signed)
Pt dc instructions provided. Pt verbalized understanding. Iv dc intact. Pt under no s/s distress. 

## 2014-08-18 ENCOUNTER — Ambulatory Visit: Payer: Self-pay | Admitting: Neurosurgery

## 2015-11-01 ENCOUNTER — Other Ambulatory Visit: Payer: Self-pay | Admitting: Neurology

## 2015-11-01 DIAGNOSIS — R2689 Other abnormalities of gait and mobility: Secondary | ICD-10-CM

## 2015-11-01 DIAGNOSIS — R42 Dizziness and giddiness: Secondary | ICD-10-CM

## 2015-11-23 ENCOUNTER — Ambulatory Visit
Admission: RE | Admit: 2015-11-23 | Discharge: 2015-11-23 | Disposition: A | Discharge status: Home / Self Care | Payer: Medicare Other | Source: Ambulatory Visit | Attending: Neurology | Admitting: Neurology

## 2015-11-23 DIAGNOSIS — M216X9 Other acquired deformities of unspecified foot: Secondary | ICD-10-CM | POA: Diagnosis not present

## 2015-11-23 DIAGNOSIS — G319 Degenerative disease of nervous system, unspecified: Secondary | ICD-10-CM | POA: Insufficient documentation

## 2015-11-23 DIAGNOSIS — R2689 Other abnormalities of gait and mobility: Secondary | ICD-10-CM

## 2015-11-23 DIAGNOSIS — R42 Dizziness and giddiness: Secondary | ICD-10-CM | POA: Diagnosis not present

## 2015-11-23 DIAGNOSIS — R260 Ataxic gait: Secondary | ICD-10-CM | POA: Insufficient documentation

## 2015-11-23 DIAGNOSIS — R278 Other lack of coordination: Secondary | ICD-10-CM | POA: Diagnosis present

## 2015-11-23 DIAGNOSIS — I739 Peripheral vascular disease, unspecified: Secondary | ICD-10-CM | POA: Insufficient documentation

## 2019-01-27 ENCOUNTER — Other Ambulatory Visit: Payer: Self-pay

## 2019-01-27 ENCOUNTER — Emergency Department
Admission: EM | Admit: 2019-01-27 | Discharge: 2019-01-27 | Disposition: A | Discharge status: Home / Self Care | Payer: Medicare Other | Attending: Student in an Organized Health Care Education/Training Program | Admitting: Student in an Organized Health Care Education/Training Program

## 2019-01-27 ENCOUNTER — Emergency Department: Payer: Medicare Other

## 2019-01-27 DIAGNOSIS — Z7982 Long term (current) use of aspirin: Secondary | ICD-10-CM | POA: Diagnosis not present

## 2019-01-27 DIAGNOSIS — M25552 Pain in left hip: Secondary | ICD-10-CM | POA: Insufficient documentation

## 2019-01-27 DIAGNOSIS — Z96652 Presence of left artificial knee joint: Secondary | ICD-10-CM | POA: Diagnosis not present

## 2019-01-27 DIAGNOSIS — M5432 Sciatica, left side: Secondary | ICD-10-CM | POA: Diagnosis not present

## 2019-01-27 DIAGNOSIS — Z79899 Other long term (current) drug therapy: Secondary | ICD-10-CM | POA: Insufficient documentation

## 2019-01-27 DIAGNOSIS — I10 Essential (primary) hypertension: Secondary | ICD-10-CM | POA: Insufficient documentation

## 2019-01-27 LAB — BASIC METABOLIC PANEL
Anion gap: 10 (ref 5–15)
BUN: 12 mg/dL (ref 8–23)
CO2: 26 mmol/L (ref 22–32)
Calcium: 8.9 mg/dL (ref 8.9–10.3)
Chloride: 100 mmol/L (ref 98–111)
Creatinine, Ser: 0.78 mg/dL (ref 0.61–1.24)
GFR calc Af Amer: >60 mL/min (ref >60)
GFR calc non Af Amer: >60 mL/min (ref >60)
Glucose, Bld: 131 mg/dL — ABNORMAL HIGH (ref 70–99)
Potassium: 4.1 mmol/L (ref 3.5–5.1)
Sodium: 136 mmol/L (ref 135–145)

## 2019-01-27 LAB — CBC WITH DIFFERENTIAL/PLATELET
Abs Immature Granulocytes: 0.02 10*3/uL (ref 0.00–0.07)
Basophils Absolute: 0 10*3/uL (ref 0.0–0.1)
Basophils Relative: 0 %
Eosinophils Absolute: 0.1 10*3/uL (ref 0.0–0.5)
Eosinophils Relative: 1 %
HCT: 39.8 % (ref 39.0–52.0)
Hemoglobin: 13.6 g/dL (ref 13.0–17.0)
Immature Granulocytes: 0 %
Lymphocytes Relative: 20 %
Lymphs Abs: 1.5 10*3/uL (ref 0.7–4.0)
MCH: 32.4 pg (ref 26.0–34.0)
MCHC: 34.2 g/dL (ref 30.0–36.0)
MCV: 94.8 fL (ref 80.0–100.0)
Monocytes Absolute: 0.7 10*3/uL (ref 0.1–1.0)
Monocytes Relative: 8 %
Neutro Abs: 5.5 10*3/uL (ref 1.7–7.7)
Neutrophils Relative %: 71 %
Platelets: 167 10*3/uL (ref 150–400)
RBC: 4.2 MIL/uL — ABNORMAL LOW (ref 4.22–5.81)
RDW: 12.4 % (ref 11.5–15.5)
WBC: 7.9 10*3/uL (ref 4.0–10.5)
nRBC: 0 % (ref 0.0–0.2)

## 2019-01-27 MED ORDER — DEXAMETHASONE SODIUM PHOSPHATE 10 MG/ML IJ SOLN
10.0000 mg | Freq: Once | INTRAMUSCULAR | Status: AC
  Administered 2019-01-27: 08:00:00 10 mg via INTRAVENOUS
  Filled 2019-01-27: qty 1

## 2019-01-27 MED ORDER — TRAMADOL HCL 50 MG PO TABS: 50.0000 mg | ORAL_TABLET | Freq: Four times a day (QID) | ORAL | 0 refills | Status: DC | PRN

## 2019-01-27 MED ORDER — NAPROXEN 500 MG PO TABS: 500.0000 mg | ORAL_TABLET | Freq: Two times a day (BID) | ORAL | 2 refills | Status: DC

## 2019-01-27 MED ORDER — ONDANSETRON HCL 4 MG/2ML IJ SOLN
4.0000 mg | Freq: Once | INTRAMUSCULAR | Status: AC
Start: 2019-01-27 — End: 2019-01-27
  Administered 2019-01-27: 4 mg via INTRAVENOUS
  Filled 2019-01-27: qty 2

## 2019-01-27 MED ORDER — MORPHINE SULFATE (PF) 2 MG/ML IV SOLN
2.0000 mg | Freq: Once | INTRAVENOUS | Status: AC
  Administered 2019-01-27: 2 mg via INTRAVENOUS
  Filled 2019-01-27: qty 1

## 2019-01-27 MED ORDER — MORPHINE SULFATE (PF) 2 MG/ML IV SOLN
2.0000 mg | INTRAVENOUS | Status: DC | PRN
  Administered 2019-01-27: 05:00:00 2 mg via INTRAVENOUS
  Filled 2019-01-27: qty 1

## 2019-01-27 NOTE — ED Triage Notes (Signed)
Pt states fell two weeks ago injuring left hip.pt states he now has pain from left groin extending to posterior hip and down lateral left leg to mid calf.

## 2019-01-27 NOTE — ED Notes (Signed)
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 Pt attempted to stand and walk x 2 but was unsuccessful in ambulating. MD made aware.

## 2019-01-27 NOTE — ED Provider Notes (Signed)
Reviewed CT results with the patient.  He has absolutely no pain in the right groin or right flank despite what appears to be a right UVJ stone.  Additionally he has a cyst in his groin which is been there for many years, it is nontender non-erythematous and not the source of his pain.  He describes significant pain when he AB ducts at the left hip.  CT lumbar spine shows arthritis and some stenosis but no obvious nerve impingement.  No broken bones.  Suspect sciatica versus muscle spasm as he describes pain that radiates down past his left knee with particular movements.  We will treat with IV Decadron   ----------------------------------------- 8:18 AM on 01/27/2019 -----------------------------------------  Patient continues to have pain in his left hip but is able to stand, typically he ambulates with a cane or walker.  Offered admission for pain control but he states that he would prefer to go home he does agree to come back if his symptoms worsen.  He will follow-up with Ortho.   Jene Every, MD 01/27/19 [phone]

## 2019-01-27 NOTE — ED Notes (Signed)
Patient to xray.

## 2019-01-27 NOTE — ED Notes (Signed)
Pt returned from xray

## 2019-01-27 NOTE — ED Notes (Signed)
Pt resting in bed, reports pain 7/10 in left hip but pt states he would like to wait for the results of his scan before getting more pain medication. No other acute concerns.

## 2019-01-27 NOTE — ED Notes (Signed)
Pt in NAD at time of departure, A&OX4, , this RN wheeled pt to spouse for transport. This RN gave spouse discharge instructions and education per pt consent. Pt and family verbalize d/c understanding and have no further questions regarding this visit.

## 2019-01-27 NOTE — ED Notes (Signed)
Spouse called at this time for ride. Will be here shortly she states

## 2019-01-27 NOTE — ED Notes (Signed)
Patient transported to CT 

## 2019-01-27 NOTE — ED Provider Notes (Signed)
[facility] Emergency Department Provider Note    First MD Initiated Contact with Patient 01/27/19 [phone]     (approximate)  I have reviewed the triage vital signs and the nursing notes.   HISTORY  Chief Complaint Hip Pain    HPI [name] is a 78 y.o. male below listed past medical history not currently on any anticoagulation presents to the ER for right and left hip and groin pain.  States he had a mechanical fall 2 weeks ago landing on his left hip area nausea since then but was having achiness and discomfort shooting down his left leg.  Denies any low back pain.  States that last night he was bending forward to brush his teeth and felt sudden worsening of the pain.  States the pain wraps around to the back of his buttock.  Denies any fevers.  Denies any dysuria or hematuria.    Past Medical History:  Diagnosis Date  . Arthritis    "lower back on down; into all joints; into my feet" (09/17/2012)  . Chronic low back pain    hx of  . Complication of anesthesia    "cardiac arrest with dye from lexiscan [04/15/10 records indicate bradycardia, no ischemia, EF 56%], unstable BP with anesthesia[  . GERD (gastroesophageal reflux disease)   . 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 Herniated disc, cervical   . Hypercholesteremia   . Hypertension    sees Dr. Jerl [facility]inaJames Hedrick, [facility] [place]  . Intracranial bleed    december 2010  . Kidney stones    "passed on their own" (09/17/2012)  . Migraines    "2 in my life" (09/17/2012)  . Retina disorder    hx of torn retina  . Stroke 2002   LLE "just a little bit weaker" (09/17/2012)   No family history on file. Past Surgical History:  Procedure Laterality Date  . BLEPHAROPLASTY  1990's   "right eye" (09/16/2012)  . CARDIOVASCULAR STRESS TEST     nl perfusion w/o ischemia, EF 56%, bradycardia without syncope 04/15/10 (Dr. Harold HedgeKenneth Fath)  . CATARACT EXTRACTION W/ INTRAOCULAR LENS  IMPLANT  1980's   "right" (09/17/2012)  . COLONOSCOPY     06/26/09  . EYE MUSCLE SURGERY  1980's   "right eye" (09/17/2012)  . INGUINAL HERNIA REPAIR  04/10/2011   "right" (09/17/2012)  . JOINT REPLACEMENT    . POSTERIOR LUMBAR FUSION  09/16/2012   "L4-5" (09/17/2012)  . RETINAL DETACHMENT SURGERY  1980's   "right" (09/17/2012)  . 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     Prior to Admission medications   Medication Sig Start Date End Date Taking? Authorizing Provider  aliskiren (TEKTURNA) 300 MG tablet Take 300 mg by mouth every morning. Time-specific, takes at 0900.   Yes [provider]  aspirin 325 MG tablet Take 325 mg by mouth daily.   Yes [provider]  carbamazepine (TEGRETOL) 200 MG tablet Take 200 mg by mouth every morning. Time-specific, takes at 0900.   Yes [provider]  cetirizine (ZYRTEC) 10 MG tablet Take 10 mg by mouth daily.   Yes [provider]  doxazosin (CARDURA) 1 MG tablet Take 1 mg by mouth every evening. Time-specific, takes at 1700.   Yes [provider]  furosemide (LASIX) 20 MG tablet Take 20 mg by mouth every morning. Time-specific, takes  at 0900.   Yes [provider]  irbesartan (AVAPRO) 300 MG tablet Take 300 mg by mouth daily. 11/19/17  Yes [provider]  Omega-3 Fatty Acids (FISH OIL) 1200 MG CAPS Take 1,200 mg by mouth daily.   Yes [provider]  pantoprazole (PROTONIX) 40 MG tablet Take 40 mg by mouth every evening. Time-specific, takes at 1700.   Yes [provider]  simvastatin (ZOCOR) 20 MG tablet Take 20 mg by mouth 2 (two) times daily. Time-specific, takes at 0900 and 1700.   Yes [provider]  verapamil (CALAN) 120 MG tablet Take 120 mg by mouth 3 (three) times daily. Time-specific, takes at 0900, 1700, and 2300.   Yes [provider]  vitamin B-12  (CYANOCOBALAMIN) 1000 MCG tablet Take 1,000 mcg by mouth daily.   Yes [provider]  propranolol (INDERAL) 80 MG tablet Take 40 mg by mouth 3 (three) times daily. Time-specific, takes at 0900, 1700, and 2300.    [provider]    Allergies Codeine; Fentanyl; and Lexiscan [regadenoson]    Social History Social History   Tobacco Use  . Smoking status: Never Smoker  . Smokeless tobacco: Never Used  Substance Use Topics  . Alcohol use: No  . Drug use: No    Review of Systems Patient denies headaches, rhinorrhea, blurry vision, numbness, shortness of breath, chest pain, edema, cough, abdominal pain, nausea, vomiting, diarrhea, dysuria, fevers, rashes or hallucinations unless otherwise stated above in HPI. ____________________________________________   PHYSICAL EXAM:  VITAL SIGNS: Vitals:   01/27/19 0608 01/27/19 0615  BP: (!) 162/97   Pulse: 65 69  Resp:    Temp:    SpO2: 96% 97%    Constitutional: Alert and oriented.  Eyes: Conjunctivae are normal.  Head: Atraumatic. Nose: No congestion/rhinnorhea. Mouth/Throat: Mucous membranes are moist.   Neck: No stridor. Painless ROM.  Cardiovascular: Normal rate, regular rhythm. Grossly normal heart sounds.  Good peripheral circulation. Respiratory: Normal respiratory effort.  No retractions. Lungs CTAB. Gastrointestinal: Soft and nontender. No distention. No abdominal bruits. No CVA tenderness. Genitourinary: Normal external genitalia.  No hernia. Musculoskeletal: Contusion to the left gluteal muscle gluteal compartments soft.  There is some discomfort with external rotation.  No shortening.  Neurovascular intact distally.  No lower extremity tenderness nor edema.  No joint effusions. Neurologic:  Normal speech and language. No gross focal neurologic deficits are appreciated. No facial droop Skin:  Skin is warm, dry and intact. No rash noted. Psychiatric: Mood and affect are normal. Speech and behavior are  normal.  ____________________________________________   LABS (all labs ordered are listed, but only abnormal results are displayed)  Results for orders placed or performed during the hospital encounter of 01/27/19 (from the past 24 hour(s))  CBC with Differential/Platelet     Status: Abnormal   Collection Time: 01/27/19  5:41 AM  Result Value Ref Range   WBC 7.9 4.0 - 10.5 K/uL   RBC 4.20 (L) 4.22 - 5.81 MIL/uL   Hemoglobin 13.6 13.0 - 17.0 g/dL   HCT 40.9 81.1 - 91.4 %   MCV 94.8 80.0 - 100.0 fL   MCH 32.4 26.0 - 34.0 pg   MCHC 34.2 30.0 - 36.0 g/dL   RDW 78.2 95.6 - 21.3 %   Platelets 167 150 - 400 K/uL   nRBC 0.0 0.0 - 0.2 %   Neutrophils Relative % 71 %   Neutro Abs 5.5 1.7 - 7.7 K/uL   Lymphocytes Relative 20 %  Lymphs Abs 1.5 0.7 - 4.0 K/uL   Monocytes Relative 8 %   Monocytes Absolute 0.7 0.1 - 1.0 K/uL   Eosinophils Relative 1 %   Eosinophils Absolute 0.1 0.0 - 0.5 K/uL   Basophils Relative 0 %   Basophils Absolute 0.0 0.0 - 0.1 K/uL   Immature Granulocytes 0 %   Abs Immature Granulocytes 0.02 0.00 - 0.07 K/uL  Basic metabolic panel     Status: Abnormal   Collection Time: 01/27/19  5:41 AM  Result Value Ref Range   Sodium 136 135 - 145 mmol/L   Potassium 4.1 3.5 - 5.1 mmol/L   Chloride 100 98 - 111 mmol/L   CO2 26 22 - 32 mmol/L   Glucose, Bld 131 (H) 70 - 99 mg/dL   BUN 12 8 - 23 mg/dL   Creatinine, Ser 9.02 0.61 - 1.24 mg/dL   Calcium 8.9 8.9 - 11.1 mg/dL   GFR calc non Af Amer >60 >60 mL/min   GFR calc Af Amer >60 >60 mL/min   Anion gap 10 5 - 15   ____________________________________________ ____________________________________________  RADIOLOGY  I personally reviewed all radiographic images ordered to evaluate for the above acute complaints and reviewed radiology reports and findings.  These findings were personally discussed with the patient.  Please see medical record for radiology report.  ____________________________________________    PROCEDURES  Procedure(s) performed:  Procedures    Critical Care performed: no ____________________________________________   INITIAL IMPRESSION / ASSESSMENT AND PLAN / ED COURSE  Pertinent labs & imaging results that were available during my care of the patient were reviewed by me and considered in my medical decision making (see chart for details).   DDX: contusion, fracture, dislocation, arthritis  Ciro E Siedschlag is a 78 y.o. who presents to the ED with left hip pain after mechanical fall.  No evidence of head injury.  Thoracic spine abdominal exam is reassuring.  Not currently on any anticoagulation.  No indication for diagnostic neurovascular intact distally.  Will provide IV pain medication, check labs as well as a  Clinical Course as of Jan 26 700  Mon Jan 27, 2019  5520 X-ray reported as negative.  I am still concerned for possible fracture particular given the patient's pain.  Remains neuro intact.  Will order CT imaging to further characterize.   [PR]    Clinical Course User Index [PR] Willy Eddy, MD   Patient will be signed out to oncoming physician pending ct results and reassessment. The patient was evaluated in Emergency Department today for the symptoms described in the history of present illness. He/she was evaluated in the context of the global COVID-19 pandemic, which necessitated consideration that the patient might be at risk for infection with the SARS-CoV-2 virus that causes COVID-19. Institutional protocols and algorithms that pertain to the evaluation of patients at risk for COVID-19 are in a state of rapid change based on information released by regulatory bodies including the CDC and federal and state organizations. These policies and algorithms were followed during the patient's care in the ED.  As part of my medical decision making, I reviewed the following data within the electronic MEDICAL RECORD NUMBER Nursing notes reviewed and incorporated, Labs  reviewed, notes from prior ED visits and  Controlled Substance Database   ____________________________________________   FINAL CLINICAL IMPRESSION(S) / ED DIAGNOSES  Final diagnoses:  Hip pain, acute, left      NEW MEDICATIONS STARTED DURING THIS VISIT:  New Prescriptions   No medications  on file     Note:  This document was prepared using Dragon voice recognition software and may include unintentional dictation errors.    Willy Eddy, MD 01/27/19 [phone]

## 2019-01-30 ENCOUNTER — Emergency Department
Admission: EM | Admit: 2019-01-30 | Discharge: 2019-01-30 | Disposition: A | Discharge status: Home / Self Care | Payer: Medicare Other | Attending: Student in an Organized Health Care Education/Training Program | Admitting: Student in an Organized Health Care Education/Training Program

## 2019-01-30 ENCOUNTER — Emergency Department: Payer: Medicare Other

## 2019-01-30 ENCOUNTER — Other Ambulatory Visit: Payer: Self-pay

## 2019-01-30 DIAGNOSIS — S73192A Other sprain of left hip, initial encounter: Secondary | ICD-10-CM | POA: Diagnosis not present

## 2019-01-30 DIAGNOSIS — Y939 Activity, unspecified: Secondary | ICD-10-CM | POA: Insufficient documentation

## 2019-01-30 DIAGNOSIS — Z96652 Presence of left artificial knee joint: Secondary | ICD-10-CM | POA: Diagnosis not present

## 2019-01-30 DIAGNOSIS — X58XXXA Exposure to other specified factors, initial encounter: Secondary | ICD-10-CM | POA: Diagnosis not present

## 2019-01-30 DIAGNOSIS — Y929 Unspecified place or not applicable: Secondary | ICD-10-CM | POA: Insufficient documentation

## 2019-01-30 DIAGNOSIS — Z1159 Encounter for screening for other viral diseases: Secondary | ICD-10-CM | POA: Diagnosis not present

## 2019-01-30 DIAGNOSIS — Z79899 Other long term (current) drug therapy: Secondary | ICD-10-CM | POA: Insufficient documentation

## 2019-01-30 DIAGNOSIS — Z7982 Long term (current) use of aspirin: Secondary | ICD-10-CM | POA: Insufficient documentation

## 2019-01-30 DIAGNOSIS — S8992XA Unspecified injury of left lower leg, initial encounter: Secondary | ICD-10-CM | POA: Diagnosis present

## 2019-01-30 DIAGNOSIS — M5416 Radiculopathy, lumbar region: Secondary | ICD-10-CM | POA: Insufficient documentation

## 2019-01-30 DIAGNOSIS — Y999 Unspecified external cause status: Secondary | ICD-10-CM | POA: Insufficient documentation

## 2019-01-30 DIAGNOSIS — I10 Essential (primary) hypertension: Secondary | ICD-10-CM | POA: Insufficient documentation

## 2019-01-30 LAB — COMPREHENSIVE METABOLIC PANEL
ALT: 17 U/L (ref 0–44)
AST: 21 U/L (ref 15–41)
Albumin: 3.7 g/dL (ref 3.5–5.0)
Alkaline Phosphatase: 82 U/L (ref 38–126)
Anion gap: 8 (ref 5–15)
BUN: 17 mg/dL (ref 8–23)
CO2: 26 mmol/L (ref 22–32)
Calcium: 9.2 mg/dL (ref 8.9–10.3)
Chloride: 102 mmol/L (ref 98–111)
Creatinine, Ser: 0.75 mg/dL (ref 0.61–1.24)
GFR calc Af Amer: >60 mL/min (ref >60)
GFR calc non Af Amer: >60 mL/min (ref >60)
Glucose, Bld: 104 mg/dL — ABNORMAL HIGH (ref 70–99)
Potassium: 4.1 mmol/L (ref 3.5–5.1)
Sodium: 136 mmol/L (ref 135–145)
Total Bilirubin: 0.8 mg/dL (ref 0.3–1.2)
Total Protein: 6.4 g/dL — ABNORMAL LOW (ref 6.5–8.1)

## 2019-01-30 LAB — CBC WITH DIFFERENTIAL/PLATELET
Abs Immature Granulocytes: 0.03 10*3/uL (ref 0.00–0.07)
Basophils Absolute: 0 10*3/uL (ref 0.0–0.1)
Basophils Relative: 0 %
Eosinophils Absolute: 0.2 10*3/uL (ref 0.0–0.5)
Eosinophils Relative: 3 %
HCT: 39.8 % (ref 39.0–52.0)
Hemoglobin: 13.8 g/dL (ref 13.0–17.0)
Immature Granulocytes: 0 %
Lymphocytes Relative: 16 %
Lymphs Abs: 1.3 10*3/uL (ref 0.7–4.0)
MCH: 32.7 pg (ref 26.0–34.0)
MCHC: 34.7 g/dL (ref 30.0–36.0)
MCV: 94.3 fL (ref 80.0–100.0)
Monocytes Absolute: 0.6 10*3/uL (ref 0.1–1.0)
Monocytes Relative: 7 %
Neutro Abs: 6 10*3/uL (ref 1.7–7.7)
Neutrophils Relative %: 74 %
Platelets: 163 10*3/uL (ref 150–400)
RBC: 4.22 MIL/uL (ref 4.22–5.81)
RDW: 12.6 % (ref 11.5–15.5)
WBC: 8.2 10*3/uL (ref 4.0–10.5)
nRBC: 0 % (ref 0.0–0.2)

## 2019-01-30 LAB — CARBAMAZEPINE LEVEL, TOTAL: Carbamazepine Lvl: 6.8 ug/mL (ref 4.0–12.0)

## 2019-01-30 MED ORDER — VERAPAMIL HCL 120 MG PO TABS
120.0000 mg | ORAL_TABLET | Freq: Three times a day (TID) | ORAL | Status: DC
  Administered 2019-01-30 (×2): 120 mg via ORAL
  Filled 2019-01-30 (×4): qty 1

## 2019-01-30 MED ORDER — FAMOTIDINE 20 MG PO TABS: 20.0000 mg | ORAL_TABLET | Freq: Every day | ORAL | 0 refills | Status: DC

## 2019-01-30 MED ORDER — MORPHINE SULFATE (PF) 2 MG/ML IV SOLN
2.0000 mg | INTRAVENOUS | Status: DC | PRN
  Administered 2019-01-30: 15:00:00 2 mg via INTRAVENOUS
  Filled 2019-01-30: qty 1

## 2019-01-30 MED ORDER — HYDROCODONE-ACETAMINOPHEN 5-325 MG PO TABS
1.0000 | ORAL_TABLET | Freq: Once | ORAL | Status: AC
  Administered 2019-01-30: 09:00:00 1 via ORAL
  Filled 2019-01-30: qty 1

## 2019-01-30 MED ORDER — HYDROCODONE-ACETAMINOPHEN 5-325 MG PO TABS: 1.0000 | ORAL_TABLET | ORAL | 0 refills | Status: DC | PRN

## 2019-01-30 MED ORDER — PREDNISONE 10 MG PO TABS: 10.0000 mg | ORAL_TABLET | Freq: Every day | ORAL | 0 refills | Status: DC

## 2019-01-30 NOTE — TOC Progression Note (Signed)
Transition of Care [facility]) - Progression Note    Patient Details  Name: [name] MRN: [id number] Date of Birth: [birth year]  [facility] CM/SW Contact  Tania Caden Fukushima, LCSW Phone Number: 01/30/2019, 12:31 PM  Clinical Narrative:    12:23pm - CSW spoke to pt's wife [name] [phone]) and shared that pt may be going to SNF. Pt's wife shared that she would prefer him to go home with home health due to her not having great experiences at SNFs. Pt's wife shared that she is okay with seeing what PT recommends, and seeing what final decision pt makes. CSW will keep wife updated.   CSW requested EDP order COVID test in case he goes to SNF.   Expected Discharge Plan: Skilled Nursing Facility Barriers to Discharge: Continued Medical Work up  Expected Discharge Plan and Services Expected Discharge Plan: Skilled Nursing Facility In-house Referral: Clinical Social Work Discharge Planning Services: CM Consult Post Acute Care Choice: Skilled Nursing Facility Living arrangements for the past 2 months: Single Family Home                                       Social Determinants of Health (SDOH) Interventions    Readmission Risk Interventions No flowsheet data found.

## 2019-01-30 NOTE — NC FL2 (Signed)
MEDICAID FL2 LEVEL OF CARE SCREENING TOOL     IDENTIFICATION  Patient Name: [name] Birthdate: 1940-12-05 Sex: male Admission Date (Current Location): 01/30/2019  [facility] and [place] Number:  Chiropodist and Address:  [facility], [address] [place], [place] [address]      Provider Number: [id number]  Attending Physician Name and Address:  Willy Eddy, MD  Relative Name and Phone Number:  Keymari Zuege; spouse; [phone]    Current Level of Care: SNF Recommended Level of Care: Skilled Nursing Facility Prior Approval Number:    Date Approved/Denied:   PASRR Number: [id number]  Discharge Plan: SNF    Current Diagnoses: There are no active problems to display for this patient.   Orientation RESPIRATION BLADDER Height & Weight     Self, Time, Situation, Place  Normal Continent Weight: 220 lb (99.8 kg) Height:  6' (182.9 cm)  BEHAVIORAL SYMPTOMS/MOOD NEUROLOGICAL BOWEL NUTRITION STATUS      Continent Diet  AMBULATORY STATUS COMMUNICATION OF NEEDS Skin   Extensive Assist Verbally Normal                       Personal Care Assistance Level of Assistance  Bathing, Feeding, Dressing Bathing Assistance: Limited assistance Feeding assistance: Independent Dressing Assistance: Limited assistance     Functional Limitations Info  Sight, Hearing, Speech Sight Info: Adequate Hearing Info: Adequate Speech Info: Adequate    SPECIAL CARE FACTORS FREQUENCY  OT (By licensed OT), PT (By licensed PT)     PT Frequency: 5 times a week OT Frequency: 5 times a week             Contractures Contractures Info: Not present    Additional Factors Info  Code Status Code Status Info: Full             Current Medications (01/30/2019):  This is the current hospital active medication list Current Facility-Administered Medications  Medication Dose Route Frequency Provider Last Rate Last Dose  .  morphine 2 MG/ML injection 2 mg  2 mg Intravenous Q2H PRN Willy Eddy, MD      . verapamil (CALAN) tablet 120 mg  120 mg Oral Q8H Willy Eddy, MD   120 mg at 01/30/19 6203   Current Outpatient Medications  Medication Sig Dispense Refill  . aliskiren (TEKTURNA) 300 MG tablet Take 300 mg by mouth daily.     Marland Kitchen aspirin 325 MG tablet Take 325 mg by mouth daily.    . carbamazepine (TEGRETOL) 200 MG tablet Take 200 mg by mouth 2 (two) times a day.     . cetirizine (ZYRTEC) 10 MG tablet Take 10 mg by mouth daily as needed for allergies.     Marland Kitchen doxazosin (CARDURA) 1 MG tablet Take 1 mg by mouth at bedtime.     . furosemide (LASIX) 20 MG tablet Take 20 mg by mouth daily.     . irbesartan (AVAPRO) 300 MG tablet Take 300 mg by mouth daily.    . naproxen (NAPROSYN) 500 MG tablet Take 1 tablet (500 mg total) by mouth 2 (two) times daily with a meal. 20 tablet 2  . Omega-3 Fatty Acids (FISH OIL) 1200 MG CAPS Take 1,200 mg by mouth daily.    . pantoprazole (PROTONIX) 40 MG tablet Take 40 mg by mouth at bedtime.     . propranolol (INDERAL) 80 MG tablet Take 40 mg by mouth 3 (three) times daily.     Marland Kitchen  simvastatin (ZOCOR) 20 MG tablet Take 20 mg by mouth 2 (two) times daily.     . traMADol (ULTRAM) 50 MG tablet Take 1 tablet (50 mg total) by mouth every 6 (six) hours as needed. (Patient taking differently: Take 50 mg by mouth every 6 (six) hours. ) 10 tablet 0  . verapamil (CALAN) 120 MG tablet Take 120 mg by mouth 3 (three) times daily.     . vitamin B-12 (CYANOCOBALAMIN) 1000 MCG tablet Take 1,000 mcg by mouth daily.       Discharge Medications: Please see discharge summary for a list of discharge medications.  Relevant Imaging Results:  Relevant Lab Results:   Additional Information SSN - [id number]  Tania Nereida Schepp, LCSW

## 2019-01-30 NOTE — ED Notes (Signed)
Patient transported to X-ray 

## 2019-01-30 NOTE — ED Notes (Signed)
Responded to call bell. Pt needed to urinate. Rolled bed to commode and patient was able to stand and pivot to commode. Pt provided self peri care and returned to bed. Pt resting comfortably.

## 2019-01-30 NOTE — TOC Initial Note (Signed)
Transition of Care [facility]) - Initial/Assessment Note    Patient Details  Name: [name] MRN: [id number] Date of Birth: Jan 18, 1941  Transition of Care [facility]) CM/SW Contact:    Cala Bradford, LCSW Phone Number: 01/30/2019, 8:56 AM  Clinical Narrative:                 Patient is a 78 year old male who presents to the hospital due to leg pain. Patient reported that his hip started hurting on Sunday and "kept getting worse." Pt reports that he is currently unable to walk. Pt states that he current lives with his wife, [name] and gave verbal permission to CSW to speak with her. Pt is oriented x4.  Pt shared that he would be open to going to SNF if it was recommended by physical therapy, and did not have a preference on which facility he wanted to go to. Pt provided permission for CSW to begin bed search.   Expected Discharge Plan: Skilled Nursing Facility Barriers to Discharge: Continued Medical Work up   Patient Goals and CMS Choice Patient states their goals for this hospitalization and ongoing recovery are:: To be able to walk       Expected Discharge Plan and Services Expected Discharge Plan: Skilled Nursing Facility In-house Referral: Clinical Social Work Discharge Planning Services: CM Consult Post Acute Care Choice: Skilled Nursing Facility Living arrangements for the past 2 months: Single Family Home                                      Prior Living Arrangements/Services Living arrangements for the past 2 months: Single Family Home Lives with:: Spouse(Wife, [name]) Patient language and need for interpreter reviewed:: No Do you feel safe going back to the place where you live?: Yes      Need for Family Participation in Patient Care: No (Comment) Care giver support system in place?: No (comment)   Criminal Activity/Legal Involvement Pertinent to Current Situation/Hospitalization: No - Comment as needed  Activities of Daily Living      Permission  Sought/Granted Permission sought to share information with : Family Supports Permission granted to share information with : Yes, Verbal Permission Granted  Share Information with NAME: [name]      Permission granted to share info w Relationship: Spouse  Permission granted to share info w Contact Information: Gala [place],  [phone]   Emotional Assessment Appearance:: Appears stated age Attitude/Demeanor/Rapport: Engaged, Gracious Affect (typically observed): Calm, Stable Orientation: : Oriented to Self, Oriented to Place, Oriented to  Time, Oriented to Situation Alcohol / Substance Use: Never Used Psych Involvement: No (comment)  Admission diagnosis:  leg pain There are no active problems to display for this patient.  PCP:  Jerl Mina, MD Pharmacy:   [facility] [address] [place] - [address] [place] [address] Phone: [phone] Fax: [phone]     Social Determinants of Health (SDOH) Interventions    Readmission Risk Interventions No flowsheet data found.

## 2019-01-30 NOTE — Progress Notes (Signed)
PT Cancellation Note  Patient Details Name: [name] MRN: [id number] DOB: November 03, 1940   Cancelled Treatment:    Reason Eval/Treat Not Completed: Other (comment)(PT reviewed chart, awaiting L hip/lumbar spine MRI results. PT will follow up as able.)  [name] PT, DPT 1:26 PM,01/30/19 [phone]

## 2019-01-30 NOTE — ED Triage Notes (Signed)
Pt from home with c/o left leg and hip pain x 5 days via [facility]. Pt evaluated 5/4 in ED with no significant findings. No new injuries. Per EMS hx of knee replacement. EMS reports pt increasingly unable to walk due to pain. EMS reports per pt. That he slid down 2x due to inability to bear weight.

## 2019-01-30 NOTE — TOC Transition Note (Signed)
Transition of Care [facility]) - CM/SW Discharge Note   Patient Details  Name: [name] MRN: [id number] Date of Birth: 10/17/1940  [facility] CM/SW Contact:  Tania Adrin Julian, LCSW Phone Number: 01/30/2019, 3:59 PM   Clinical Narrative:    PT was unable to see pt due to his blood pressure being too high.  Pt's wife and pt preferred to go home with Home Health services for PT. EDP shared that pt is medically cleared for discharge, and will send him home with wheelchair. Pt shared that he has necessary DME at home.   Pt was provided with list of home health agencies, and chose [facility]. [facility] at [facility] was provided with pt's name, date of birth, and phone number.  CSW spoke with pt's wife, and she was informed of pt's decision, and information regarding home health services.   Final next level of care: Home w Home Health Services Barriers to Discharge: No Barriers Identified   Patient Goals and CMS Choice Patient states their goals for this hospitalization and ongoing recovery are:: To go home with home health  CMS Medicare.gov Compare Post Acute Care list provided to:: Patient Choice offered to / list presented to : Patient, Spouse  Discharge Placement                  Name of family member notified: [name]; spouse Patient and family notified of of transfer: 01/30/19  Discharge Plan and Services In-house Referral: Clinical Social Work Discharge Planning Services: CM Consult Post Acute Care Choice: Skilled Nursing Facility                    HH Arranged: PT [facility] Agency: [facility] Date HH Agency Contacted: 01/30/19 Time HH Agency Contacted: 1544 Representative spoke with at [facility] Agency: Feliberto Gottron  Social Determinants of Health (SDOH) Interventions     Readmission Risk Interventions No flowsheet data found.

## 2019-01-30 NOTE — ED Notes (Signed)
Pt c/o "pain meds not working." MD Roxan Hockey notified.

## 2019-01-30 NOTE — ED Notes (Signed)
Patient transported to MRI 

## 2019-01-30 NOTE — ED Provider Notes (Signed)
[facility] Emergency Department Provider Note    None    (approximate)  I have reviewed the triage vital signs and the nursing notes.   HISTORY  Chief Complaint Leg Pain    HPI [name] is a 78 y.o. male below listed past medical history presents the ER for worsening of left knee pain.  Denies any interval falls since last being seen but is now having difficulty bearing weight due to severe pain in his left knee.  Describes the pain is aching and constant.  Worsened with movement.  Denies any new back pain.  No abdominal pain.  No nausea or vomiting.  No fevers.    Past Medical History:  Diagnosis Date  . Arthritis    "lower back on down; into all joints; into my feet" (10/09/2012)  . Chronic low back pain    hx of  . Complication of anesthesia    "cardiac arrest with dye from lexiscan [04/15/10 records indicate bradycardia, no ischemia, EF 56%], unstable BP with anesthesia[  . GERD (gastroesophageal reflux disease)   . 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 Herniated disc, cervical   . Hypercholesteremia   . Hypertension    sees Dr. Jerl Mina, [facility] in [place]  . Intracranial bleed [facility])    december 2010  . Kidney stones    "passed on their own" (Oct 09, 2012)  . Migraines    "2 in my life" (10-09-2012)  . Retina disorder    hx of torn retina  . Stroke (HCC) 2002   LLE "just a little bit weaker" (2012-10-09)   No family history on file. Past Surgical History:  Procedure Laterality Date  . BLEPHAROPLASTY  1990's   "right eye" (09/16/2012)  . CARDIOVASCULAR STRESS TEST     nl perfusion w/o ischemia, EF 56%, bradycardia without syncope 04/15/10 (Dr. Harold Hedge)  . CATARACT EXTRACTION W/ INTRAOCULAR LENS IMPLANT  1980's   "right" (10/09/12)  . COLONOSCOPY     06/26/09  . EYE MUSCLE SURGERY  1980's   "right eye" (Oct 09, 2012)  . INGUINAL HERNIA REPAIR  04/10/2011   "right" (Oct 09, 2012)  . JOINT  REPLACEMENT    . POSTERIOR LUMBAR FUSION  09/16/2012   "L4-5" (10/09/12)  . RETINAL DETACHMENT SURGERY  1980's   "right" (2012-10-09)  . TOTAL KNEE ARTHROPLASTY  04/28/2010   "left" (Oct 09, 2012)   There are no active problems to display for this patient.     Prior to Admission medications   Medication Sig Start Date End Date Taking? Authorizing Provider  aliskiren (TEKTURNA) 300 MG tablet Take 300 mg by mouth daily.    Yes [provider]  aspirin 325 MG tablet Take 325 mg by mouth daily.   Yes [provider]  carbamazepine (TEGRETOL) 200 MG tablet Take 200 mg by mouth 2 (two) times a day.    Yes [provider]  cetirizine (ZYRTEC) 10 MG tablet Take 10 mg by mouth daily as needed for allergies.    Yes [provider]  doxazosin (CARDURA) 1 MG tablet Take 1 mg by mouth at bedtime.    Yes [provider]  furosemide (LASIX) 20 MG tablet Take 20 mg by mouth daily.    Yes [provider]  irbesartan (AVAPRO) 300 MG tablet Take 300 mg by mouth daily. 11/19/17  Yes [provider]  naproxen (NAPROSYN) 500 MG tablet Take 1 tablet (500 mg total) by mouth 2 (two) times daily  with a meal. 01/27/19  Yes Jene Every, MD  Omega-3 Fatty Acids (FISH OIL) 1200 MG CAPS Take 1,200 mg by mouth daily.   Yes [provider]  pantoprazole (PROTONIX) 40 MG tablet Take 40 mg by mouth at bedtime.    Yes [provider]  propranolol (INDERAL) 80 MG tablet Take 40 mg by mouth 3 (three) times daily.    Yes [provider]  simvastatin (ZOCOR) 20 MG tablet Take 20 mg by mouth 2 (two) times daily.    Yes [provider]  traMADol (ULTRAM) 50 MG tablet Take 1 tablet (50 mg total) by mouth every 6 (six) hours as needed. Patient taking differently: Take 50 mg by mouth every 6 (six) hours.  01/27/19 01/27/20 Yes Willy Eddy, MD  verapamil (CALAN) 120 MG tablet Take 120 mg by mouth 3 (three) times daily.    Yes [provider]  vitamin B-12 (CYANOCOBALAMIN) 1000 MCG tablet Take 1,000 mcg by mouth daily.   Yes [provider]  famotidine (PEPCID) 20 MG tablet Take 1 tablet (20 mg total) by mouth daily. 01/30/19 01/30/20  Willy Eddy, MD  HYDROcodone-acetaminophen (NORCO) 5-325 MG tablet Take 1 tablet by mouth every 4 (four) hours as needed for moderate pain. 01/30/19   Willy Eddy, MD  predniSONE (DELTASONE) 10 MG tablet Take 1 tablet (10 mg total) by mouth daily. Day 1-2: Take 50 mg  ( 5 pills) Day 3-4 : Take 40 mg (4pills) Day 5-6: Take 30 mg (3 pills) Day 7-8:  Take 20 mg (2 pills) Day 9:  Take  (1 pill) 01/30/19   Willy Eddy, MD    Allergies Codeine; Fentanyl; and Lexiscan [regadenoson]    Social History Social History   Tobacco Use  . Smoking status: Never Smoker  . Smokeless tobacco: Never Used  Substance Use Topics  . Alcohol use: No  . Drug use: No    Review of Systems Patient denies headaches, rhinorrhea, blurry vision, numbness, shortness of breath, chest pain, edema, cough, abdominal pain, nausea, vomiting, diarrhea, dysuria, fevers, rashes or hallucinations unless otherwise stated above in HPI. ____________________________________________   PHYSICAL EXAM:  VITAL SIGNS: Vitals:   01/30/19 1100 01/30/19 1351  BP: (!) 165/83 (!) 188/90  Pulse: 66 60  Resp:  18  Temp:    SpO2: 96% 96%    Constitutional: Alert and oriented.  Eyes: Conjunctivae are normal.  Head: Atraumatic. Nose: No congestion/rhinnorhea. Mouth/Throat: Mucous membranes are moist.   Neck: No stridor. Painless ROM.  Cardiovascular: Normal rate, regular rhythm. Grossly normal heart sounds.  Good peripheral circulation. Respiratory: Normal respiratory effort.  No retractions. Lungs CTAB. Gastrointestinal: Soft and nontender. No distention. No abdominal bruits. No CVA tenderness. Genitourinary:  Musculoskeletal: There is soft non-tense effusion to the left knee.  No patellar  tenderness to palpation.  No overlying warmth or erythema.  No pain with logroll of the left hip.  Pain is worsened by internal rotation of left leg.  N/V intact distally. No lower extremity tenderness nor edema.  No joint effusions. Neurologic:  Normal speech and language. No gross focal neurologic deficits are appreciated. No facial droop Skin:  Skin is warm, dry and intact. No rash noted. Psychiatric: Mood and affect are normal. Speech and behavior are normal.  ____________________________________________   LABS (all labs ordered are listed, but only abnormal results are displayed)  Results for orders placed or performed during the hospital encounter of 01/30/19 (from the past 24 hour(s))  CBC with Differential/Platelet  Status: None   Collection Time: 01/30/19  8:30 AM  Result Value Ref Range   WBC 8.2 4.0 - 10.5 K/uL   RBC 4.22 4.22 - 5.81 MIL/uL   Hemoglobin 13.8 13.0 - 17.0 g/dL   HCT 60.6 30.1 - 60.1 %   MCV 94.3 80.0 - 100.0 fL   MCH 32.7 26.0 - 34.0 pg   MCHC 34.7 30.0 - 36.0 g/dL   RDW 09.3 23.5 - 57.3 %   Platelets 163 150 - 400 K/uL   nRBC 0.0 0.0 - 0.2 %   Neutrophils Relative % 74 %   Neutro Abs 6.0 1.7 - 7.7 K/uL   Lymphocytes Relative 16 %   Lymphs Abs 1.3 0.7 - 4.0 K/uL   Monocytes Relative 7 %   Monocytes Absolute 0.6 0.1 - 1.0 K/uL   Eosinophils Relative 3 %   Eosinophils Absolute 0.2 0.0 - 0.5 K/uL   Basophils Relative 0 %   Basophils Absolute 0.0 0.0 - 0.1 K/uL   Immature Granulocytes 0 %   Abs Immature Granulocytes 0.03 0.00 - 0.07 K/uL  Comprehensive metabolic panel     Status: Abnormal   Collection Time: 01/30/19  8:30 AM  Result Value Ref Range   Sodium 136 135 - 145 mmol/L   Potassium 4.1 3.5 - 5.1 mmol/L   Chloride 102 98 - 111 mmol/L   CO2 26 22 - 32 mmol/L   Glucose, Bld 104 (H) 70 - 99 mg/dL   BUN 17 8 - 23 mg/dL   Creatinine, Ser 2.20 0.61 - 1.24 mg/dL   Calcium 9.2 8.9 - 25.4 mg/dL   Total Protein 6.4 (L) 6.5 - 8.1 g/dL   Albumin 3.7  3.5 - 5.0 g/dL   AST 21 15 - 41 U/L   ALT 17 0 - 44 U/L   Alkaline Phosphatase 82 38 - 126 U/L   Total Bilirubin 0.8 0.3 - 1.2 mg/dL   GFR calc non Af Amer >60 >60 mL/min   GFR calc Af Amer >60 >60 mL/min   Anion gap 8 5 - 15  Carbamazepine level, total     Status: None   Collection Time: 01/30/19  9:13 AM  Result Value Ref Range   Carbamazepine Lvl 6.8 4.0 - 12.0 ug/mL   ____________________________________________ ____________________________________________  RADIOLOGY  I personally reviewed all radiographic images ordered to evaluate for the above acute complaints and reviewed radiology reports and findings.  These findings were personally discussed with the patient.  Please see medical record for radiology report.  ____________________________________________   PROCEDURES  Procedure(s) performed:  Procedures    Critical Care performed: no ____________________________________________   INITIAL IMPRESSION / ASSESSMENT AND PLAN / ED COURSE  Pertinent labs & imaging results that were available during my care of the patient were reviewed by me and considered in my medical decision making (see chart for details).   DDX: fracture, contusion, effusion, arthritis, septic arthritis, gout  [name] is a 78 y.o. who presents to the ED with persistent left hip pain as described above.  Based on duration of symptoms will order MRI to further evaluate.  Will provide pain medication.  Clinical Course as of Jan 30 1540  Thu Jan 30, 2019  0910 Repeat x-ray imaging and CT imaging equivocal.  Given age and persistent symptoms now worsening will order MRI to evaluate for occult fracture or radiculopathy causing the patient's symptoms.  Will consult PT as well as social work as he is currently unable to walk.   [  PR]  1356 MRI results discussed with patient.  He has no motor weakness to suggest motor component radiculopathy but does have a sensory deficit in the distribution.    [PR]  1459 Discussed case with Dr. Marcell BarlowYarborough of neurosurgery.  He has reviewed the MRI and agrees that this appears appropriate for outpatient follow-up.  PT is currently evaluating patient.   [PR]  1502 Discussed case with Dr. Rosita KeaMenz of orthopedics who has reviewed imaging as well and states that labral tear would be typically managed with steroid injection in their outpatient clinic.  At this point patient will be signed out to oncoming physician pending recommendations of social work as well as physical therapy.   [PR]  1535 Patient valuate by social work.  Family and patient requesting discharge home with home health.  Also requesting prescription for wheelchair.   [PR]    Clinical Course User Index [PR] Willy Eddyobinson, Tanashia Ciesla, MD    The patient was evaluated in Emergency Department today for the symptoms described in the history of present illness. He/she was evaluated in the context of the global COVID-19 pandemic, which necessitated consideration that the patient might be at risk for infection with the SARS-CoV-2 virus that causes COVID-19. Institutional protocols and algorithms that pertain to the evaluation of patients at risk for COVID-19 are in a state of rapid change based on information released by regulatory bodies including the CDC and federal and state organizations. These policies and algorithms were followed during the patient's care in the ED.  As part of my medical decision making, I reviewed the following data within the electronic MEDICAL RECORD NUMBER Nursing notes reviewed and incorporated, Labs reviewed, notes from prior ED visits and [place] Controlled Substance Database   ____________________________________________   FINAL CLINICAL IMPRESSION(S) / ED DIAGNOSES  Final diagnoses:  Tear of left acetabular labrum, initial encounter  Lumbar radiculopathy      NEW MEDICATIONS STARTED DURING THIS VISIT:  New Prescriptions   FAMOTIDINE (PEPCID) 20 MG TABLET    Take 1 tablet (20 mg  total) by mouth daily.   HYDROCODONE-ACETAMINOPHEN (NORCO) 5-325 MG TABLET    Take 1 tablet by mouth every 4 (four) hours as needed for moderate pain.   PREDNISONE (DELTASONE) 10 MG TABLET    Take 1 tablet (10 mg total) by mouth daily. Day 1-2: Take 50 mg  ( 5 pills) Day 3-4 : Take 40 mg (4pills) Day 5-6: Take 30 mg (3 pills) Day 7-8:  Take 20 mg (2 pills) Day 9:  Take 10mg  (1 pill)     Note:  This document was prepared using Dragon voice recognition software and may include unintentional dictation errors.    Willy Eddyobinson, Fatma Rutten, MD 01/30/19 [phone]

## 2019-01-31 LAB — NOVEL CORONAVIRUS, NAA (HOSP ORDER, SEND-OUT TO REF LAB; TAT 18-24 HRS): SARS-CoV-2, NAA: NOT DETECTED

## 2019-02-17 ENCOUNTER — Other Ambulatory Visit: Payer: Self-pay

## 2019-02-17 ENCOUNTER — Observation Stay
Admission: EM | Admit: 2019-02-17 | Discharge: 2019-02-18 | Disposition: A | Discharge status: Home / Self Care | Payer: Medicare Other | Attending: Internal Medicine | Admitting: Internal Medicine

## 2019-02-17 ENCOUNTER — Emergency Department: Payer: Medicare Other

## 2019-02-17 ENCOUNTER — Observation Stay (HOSPITAL_BASED_OUTPATIENT_CLINIC_OR_DEPARTMENT_OTHER)
Admit: 2019-02-17 | Discharge: 2019-02-17 | Disposition: A | Discharge status: Home / Self Care | Payer: Medicare Other | Attending: Internal Medicine | Admitting: Internal Medicine

## 2019-02-17 DIAGNOSIS — Z1159 Encounter for screening for other viral diseases: Secondary | ICD-10-CM | POA: Diagnosis not present

## 2019-02-17 DIAGNOSIS — E7800 Pure hypercholesterolemia, unspecified: Secondary | ICD-10-CM | POA: Diagnosis not present

## 2019-02-17 DIAGNOSIS — Z7982 Long term (current) use of aspirin: Secondary | ICD-10-CM | POA: Diagnosis not present

## 2019-02-17 DIAGNOSIS — Z79899 Other long term (current) drug therapy: Secondary | ICD-10-CM | POA: Diagnosis not present

## 2019-02-17 DIAGNOSIS — E785 Hyperlipidemia, unspecified: Secondary | ICD-10-CM | POA: Diagnosis not present

## 2019-02-17 DIAGNOSIS — Z8673 Personal history of transient ischemic attack (TIA), and cerebral infarction without residual deficits: Secondary | ICD-10-CM | POA: Diagnosis not present

## 2019-02-17 DIAGNOSIS — G8929 Other chronic pain: Secondary | ICD-10-CM | POA: Diagnosis not present

## 2019-02-17 DIAGNOSIS — M25552 Pain in left hip: Secondary | ICD-10-CM | POA: Insufficient documentation

## 2019-02-17 DIAGNOSIS — I10 Essential (primary) hypertension: Secondary | ICD-10-CM | POA: Diagnosis not present

## 2019-02-17 DIAGNOSIS — Z981 Arthrodesis status: Secondary | ICD-10-CM | POA: Diagnosis not present

## 2019-02-17 DIAGNOSIS — W06XXXA Fall from bed, initial encounter: Secondary | ICD-10-CM | POA: Insufficient documentation

## 2019-02-17 DIAGNOSIS — I444 Left anterior fascicular block: Secondary | ICD-10-CM | POA: Diagnosis not present

## 2019-02-17 DIAGNOSIS — I340 Nonrheumatic mitral (valve) insufficiency: Secondary | ICD-10-CM | POA: Diagnosis not present

## 2019-02-17 DIAGNOSIS — Z791 Long term (current) use of non-steroidal anti-inflammatories (NSAID): Secondary | ICD-10-CM | POA: Diagnosis not present

## 2019-02-17 DIAGNOSIS — M1990 Unspecified osteoarthritis, unspecified site: Secondary | ICD-10-CM | POA: Insufficient documentation

## 2019-02-17 DIAGNOSIS — R531 Weakness: Secondary | ICD-10-CM | POA: Diagnosis present

## 2019-02-17 DIAGNOSIS — Z8674 Personal history of sudden cardiac arrest: Secondary | ICD-10-CM | POA: Insufficient documentation

## 2019-02-17 DIAGNOSIS — W19XXXA Unspecified fall, initial encounter: Secondary | ICD-10-CM

## 2019-02-17 DIAGNOSIS — I361 Nonrheumatic tricuspid (valve) insufficiency: Secondary | ICD-10-CM | POA: Diagnosis not present

## 2019-02-17 DIAGNOSIS — K219 Gastro-esophageal reflux disease without esophagitis: Secondary | ICD-10-CM | POA: Insufficient documentation

## 2019-02-17 DIAGNOSIS — I4891 Unspecified atrial fibrillation: Principal | ICD-10-CM | POA: Diagnosis present

## 2019-02-17 LAB — BASIC METABOLIC PANEL
Anion gap: 7 (ref 5–15)
BUN: 16 mg/dL (ref 8–23)
CO2: 29 mmol/L (ref 22–32)
Calcium: 9.5 mg/dL (ref 8.9–10.3)
Chloride: 100 mmol/L (ref 98–111)
Creatinine, Ser: 0.93 mg/dL (ref 0.61–1.24)
GFR calc Af Amer: >60 mL/min (ref >60)
GFR calc non Af Amer: >60 mL/min (ref >60)
Glucose, Bld: 125 mg/dL — ABNORMAL HIGH (ref 70–99)
Potassium: 4.4 mmol/L (ref 3.5–5.1)
Sodium: 136 mmol/L (ref 135–145)

## 2019-02-17 LAB — TROPONIN I
Troponin I: <0.03 ng/mL (ref <0.03)
Troponin I: <0.03 ng/mL (ref <0.03)
Troponin I: 0.07 ng/mL (ref <0.03)
Troponin I: <0.03 ng/mL (ref <0.03)

## 2019-02-17 LAB — URINALYSIS, COMPLETE (UACMP) WITH MICROSCOPIC
Bacteria, UA: NONE SEEN
Bilirubin Urine: NEGATIVE
Glucose, UA: NEGATIVE mg/dL
Hgb urine dipstick: NEGATIVE
Ketones, ur: NEGATIVE mg/dL
Leukocytes,Ua: NEGATIVE
Nitrite: NEGATIVE
Protein, ur: NEGATIVE mg/dL
Specific Gravity, Urine: 1.015 (ref 1.005–1.030)
Squamous Epithelial / HPF: NONE SEEN (ref 0–5)
pH: 5 (ref 5.0–8.0)

## 2019-02-17 LAB — CBC WITH DIFFERENTIAL/PLATELET
Abs Immature Granulocytes: 0.06 10*3/uL (ref 0.00–0.07)
Basophils Absolute: 0 10*3/uL (ref 0.0–0.1)
Basophils Relative: 0 %
Eosinophils Absolute: 0.2 10*3/uL (ref 0.0–0.5)
Eosinophils Relative: 2 %
HCT: 41.8 % (ref 39.0–52.0)
Hemoglobin: 14 g/dL (ref 13.0–17.0)
Immature Granulocytes: 1 %
Lymphocytes Relative: 9 %
Lymphs Abs: 1 10*3/uL (ref 0.7–4.0)
MCH: 32.3 pg (ref 26.0–34.0)
MCHC: 33.5 g/dL (ref 30.0–36.0)
MCV: 96.3 fL (ref 80.0–100.0)
Monocytes Absolute: 0.7 10*3/uL (ref 0.1–1.0)
Monocytes Relative: 7 %
Neutro Abs: 8.8 10*3/uL — ABNORMAL HIGH (ref 1.7–7.7)
Neutrophils Relative %: 81 %
Platelets: 172 10*3/uL (ref 150–400)
RBC: 4.34 MIL/uL (ref 4.22–5.81)
RDW: 12.6 % (ref 11.5–15.5)
WBC: 10.7 10*3/uL — ABNORMAL HIGH (ref 4.0–10.5)
nRBC: 0 % (ref 0.0–0.2)

## 2019-02-17 LAB — ECHOCARDIOGRAM COMPLETE
Height: 72 in
Weight: 3440 oz

## 2019-02-17 LAB — TSH: TSH: 0.997 u[IU]/mL (ref 0.350–4.500)

## 2019-02-17 LAB — CARBAMAZEPINE LEVEL, TOTAL: Carbamazepine Lvl: 10.2 ug/mL (ref 4.0–12.0)

## 2019-02-17 LAB — SARS CORONAVIRUS 2 BY RT PCR (HOSPITAL ORDER, PERFORMED IN ~~LOC~~ HOSPITAL LAB): SARS Coronavirus 2: NEGATIVE

## 2019-02-17 MED ORDER — HYDROCODONE-ACETAMINOPHEN 5-325 MG PO TABS
1.0000 | ORAL_TABLET | ORAL | Status: DC | PRN
  Administered 2019-02-17 – 2019-02-18 (×4): 1 via ORAL
  Filled 2019-02-17 (×4): qty 1

## 2019-02-17 MED ORDER — ASPIRIN 325 MG PO TABS
325.0000 mg | ORAL_TABLET | Freq: Every day | ORAL | Status: DC
  Administered 2019-02-18: 325 mg via ORAL
  Filled 2019-02-17 (×2): qty 1

## 2019-02-17 MED ORDER — FAMOTIDINE 20 MG PO TABS
20.0000 mg | ORAL_TABLET | Freq: Every day | ORAL | Status: DC
  Administered 2019-02-17 – 2019-02-18 (×2): 20 mg via ORAL
  Filled 2019-02-17 (×2): qty 1

## 2019-02-17 MED ORDER — DOXAZOSIN MESYLATE 1 MG PO TABS
1.0000 mg | ORAL_TABLET | Freq: Every day | ORAL | Status: DC
  Administered 2019-02-17: 1 mg via ORAL
  Filled 2019-02-17 (×3): qty 1

## 2019-02-17 MED ORDER — LORATADINE 10 MG PO TABS
10.0000 mg | ORAL_TABLET | Freq: Every day | ORAL | Status: DC
  Administered 2019-02-17 – 2019-02-18 (×2): 10 mg via ORAL
  Filled 2019-02-17 (×2): qty 1

## 2019-02-17 MED ORDER — ACETAMINOPHEN 325 MG PO TABS
650.0000 mg | ORAL_TABLET | ORAL | Status: DC | PRN
  Administered 2019-02-18: 04:00:00 650 mg via ORAL
  Filled 2019-02-17: qty 2

## 2019-02-17 MED ORDER — VITAMIN B-12 1000 MCG PO TABS
1000.0000 ug | ORAL_TABLET | Freq: Every day | ORAL | Status: DC
  Administered 2019-02-17 – 2019-02-18 (×2): 1000 ug via ORAL
  Filled 2019-02-17 (×2): qty 1

## 2019-02-17 MED ORDER — SIMVASTATIN 20 MG PO TABS
20.0000 mg | ORAL_TABLET | Freq: Two times a day (BID) | ORAL | Status: DC
  Administered 2019-02-17 – 2019-02-18 (×3): 20 mg via ORAL
  Filled 2019-02-17 (×3): qty 1

## 2019-02-17 MED ORDER — IRBESARTAN 150 MG PO TABS
300.0000 mg | ORAL_TABLET | Freq: Every day | ORAL | Status: DC
  Administered 2019-02-17 – 2019-02-18 (×2): 300 mg via ORAL
  Filled 2019-02-17 (×2): qty 2

## 2019-02-17 MED ORDER — VERAPAMIL HCL 120 MG PO TABS
120.0000 mg | ORAL_TABLET | Freq: Three times a day (TID) | ORAL | Status: DC
  Administered 2019-02-17 (×3): 120 mg via ORAL
  Filled 2019-02-17 (×4): qty 1

## 2019-02-17 MED ORDER — HYDRALAZINE HCL 20 MG/ML IJ SOLN: 5.0000 mg | INTRAMUSCULAR | Status: DC | PRN

## 2019-02-17 MED ORDER — KETOROLAC TROMETHAMINE 30 MG/ML IJ SOLN
10.0000 mg | Freq: Once | INTRAMUSCULAR | Status: AC
  Administered 2019-02-17: 04:00:00 9.9 mg via INTRAVENOUS
  Filled 2019-02-17: qty 1

## 2019-02-17 MED ORDER — FUROSEMIDE 20 MG PO TABS
20.0000 mg | ORAL_TABLET | Freq: Every day | ORAL | Status: DC
  Administered 2019-02-17 – 2019-02-18 (×2): 20 mg via ORAL
  Filled 2019-02-17 (×2): qty 1

## 2019-02-17 MED ORDER — ALISKIREN FUMARATE 150 MG PO TABS
300.0000 mg | ORAL_TABLET | Freq: Every day | ORAL | Status: DC
  Administered 2019-02-17 – 2019-02-18 (×2): 300 mg via ORAL
  Filled 2019-02-17 (×2): qty 2

## 2019-02-17 MED ORDER — ENOXAPARIN SODIUM 40 MG/0.4ML ~~LOC~~ SOLN
40.0000 mg | SUBCUTANEOUS | Status: DC
  Administered 2019-02-17: 40 mg via SUBCUTANEOUS
  Filled 2019-02-17: qty 0.4

## 2019-02-17 MED ORDER — ONDANSETRON HCL 4 MG/2ML IJ SOLN
4.0000 mg | Freq: Four times a day (QID) | INTRAMUSCULAR | Status: DC | PRN
  Administered 2019-02-18: 4 mg via INTRAVENOUS

## 2019-02-17 MED ORDER — PROPRANOLOL HCL 40 MG PO TABS
40.0000 mg | ORAL_TABLET | Freq: Three times a day (TID) | ORAL | Status: DC
  Administered 2019-02-17 (×3): 40 mg via ORAL
  Filled 2019-02-17 (×4): qty 1

## 2019-02-17 MED ORDER — CARBAMAZEPINE 200 MG PO TABS
200.0000 mg | ORAL_TABLET | Freq: Two times a day (BID) | ORAL | Status: DC
  Administered 2019-02-17 – 2019-02-18 (×3): 200 mg via ORAL
  Filled 2019-02-17 (×4): qty 1

## 2019-02-17 MED ORDER — PANTOPRAZOLE SODIUM 40 MG PO TBEC
40.0000 mg | DELAYED_RELEASE_TABLET | Freq: Every day | ORAL | Status: DC
  Administered 2019-02-17: 40 mg via ORAL
  Filled 2019-02-17: qty 1

## 2019-02-17 MED ORDER — ASPIRIN 81 MG PO CHEW
324.0000 mg | CHEWABLE_TABLET | Freq: Once | ORAL | Status: AC
  Administered 2019-02-17: 324 mg via ORAL
  Filled 2019-02-17: qty 4

## 2019-02-17 NOTE — ED Provider Notes (Signed)
[facility] Emergency Department Provider Note   ____________________________________________   First MD Initiated Contact with Patient 02/17/19 0040     (approximate)  I have reviewed the triage vital signs and the nursing notes.   HISTORY  Chief Complaint Tremors    HPI [name] is a 78 y.o. male to the ED via EMS from home status post mechanical fall.  Patient has been seeing orthopedics, neurosurgery as well as PM&R with steroid injections for left labral tear and lumbar radiculitis.  He is supposed to be using a wheelchair but never got it.  By all reports in the computer, patient has poor ambulation at baseline.  Patient states he got up to use the restroom, his legs became weak and tremulous, and he fell onto his walker, striking his chest, then slowly eased down to his knees, then onto his bottom.  Did not strike his head or LOC.  Only takes a baby aspirin daily.  Currently denies any pain other than his chronic left hip pain that he has had for the past 6 weeks.  Denies recent fever, cough, chest pain, shortness of breath, abdominal pain, nausea or vomiting.  Denies recent travel or exposure to persons diagnosed with coronavirus.        Past Medical History:  Diagnosis Date   Arthritis    "lower back on down; into all joints; into my feet" (October 07, 2012)   Chronic low back pain    hx of   Complication of anesthesia    "cardiac arrest with dye from lexiscan [04/15/10 records indicate bradycardia, no ischemia, EF 56%], unstable BP with anesthesia[   GERD (gastroesophageal reflux disease)    Grand mal 1987; 1989   "take RX daily" (10-07-2012)   Herniated disc, cervical    Hypercholesteremia    Hypertension    sees Dr. Jerl Mina, [facility] in [place]   Intracranial bleed [facility])    2009/10/13  Kidney stones    "passed on their own" (2012-10-07)   Migraines    "2 in my life" (10-07-12)   Retina disorder    hx of  torn retina   Stroke (HCC) 2002   LLE "just a little bit weaker" (Oct 07, 2012)    There are no active problems to display for this patient.   Past Surgical History:  Procedure Laterality Date   BLEPHAROPLASTY  1990's   "right eye" (09/16/2012)   CARDIOVASCULAR STRESS TEST     nl perfusion w/o ischemia, EF 56%, bradycardia without syncope 04/15/10 (Dr. Harold Hedge)   CATARACT EXTRACTION W/ INTRAOCULAR LENS IMPLANT  1980's   "right" (October 07, 2012)   COLONOSCOPY     06/26/09   EYE MUSCLE SURGERY  1980's   "right eye" (2012-10-07)   INGUINAL HERNIA REPAIR  04/10/2011   "right" (10-07-2012)   JOINT REPLACEMENT     POSTERIOR LUMBAR FUSION  09/16/2012   "L4-5" (2012-10-07)   RETINAL DETACHMENT SURGERY  1980's   "right" (2012/10/07)   TOTAL KNEE ARTHROPLASTY  04/28/2010   "left" (October 07, 2012)    Prior to Admission medications   Medication Sig Start Date End Date Taking? Authorizing Provider  aliskiren (TEKTURNA) 300 MG tablet Take 300 mg by mouth daily.    Yes [provider]  aspirin 325 MG tablet Take 325 mg by mouth daily.   Yes [provider]  carbamazepine (TEGRETOL) 200 MG tablet Take 200 mg by mouth 2 (two) times a day.    Yes [provider]  cetirizine (  ZYRTEC) 10 MG tablet Take 10 mg by mouth daily as needed for allergies.    Yes [provider]  doxazosin (CARDURA) 1 MG tablet Take 1 mg by mouth at bedtime.    Yes [provider]  famotidine (PEPCID) 20 MG tablet Take 1 tablet (20 mg total) by mouth daily. 01/30/19 01/30/20 Yes Willy Eddyobinson, Patrick, MD  furosemide (LASIX) 20 MG tablet Take 20 mg by mouth daily.    Yes [provider]  HYDROcodone-acetaminophen (NORCO) 5-325 MG tablet Take 1 tablet by mouth every 4 (four) hours as needed for moderate pain. 01/30/19  Yes Willy Eddyobinson, Patrick, MD  irbesartan (AVAPRO) 300 MG tablet Take 300 mg by mouth daily. 11/19/17  Yes [provider]  naproxen (NAPROSYN) 500 MG tablet  Take 1 tablet (500 mg total) by mouth 2 (two) times daily with a meal. 01/27/19  Yes Jene EveryKinner, Robert, MD  Omega-3 Fatty Acids (FISH OIL) 1200 MG CAPS Take 1,200 mg by mouth daily.   Yes [provider]  pantoprazole (PROTONIX) 40 MG tablet Take 40 mg by mouth at bedtime.    Yes [provider]  propranolol (INDERAL) 80 MG tablet Take 40 mg by mouth 3 (three) times daily.    Yes [provider]  simvastatin (ZOCOR) 20 MG tablet Take 20 mg by mouth 2 (two) times daily.    Yes [provider]  traMADol (ULTRAM) 50 MG tablet Take 1 tablet (50 mg total) by mouth every 6 (six) hours as needed. Patient taking differently: Take 50 mg by mouth every 6 (six) hours.  01/27/19 01/27/20 Yes Willy Eddyobinson, Patrick, MD  verapamil (CALAN) 120 MG tablet Take 120 mg by mouth 3 (three) times daily.    Yes [provider]  vitamin B-12 (CYANOCOBALAMIN) 1000 MCG tablet Take 1,000 mcg by mouth daily.   Yes [provider]  predniSONE (DELTASONE) 10 MG tablet Take 1 tablet (10 mg total) by mouth daily. Day 1-2: Take 50 mg  ( 5 pills) Day 3-4 : Take 40 mg (4pills) Day 5-6: Take 30 mg (3 pills) Day 7-8:  Take 20 mg (2 pills) Day 9:  Take 10mg  (1 pill) Patient not taking: Reported on 02/17/2019 01/30/19   Willy Eddyobinson, Patrick, MD    Allergies Codeine; Fentanyl; and Lexiscan [regadenoson]  History reviewed. No pertinent family history.  Social History Social History   Tobacco Use   Smoking status: Never Smoker   Smokeless tobacco: Never Used  Substance Use Topics   Alcohol use: No   Drug use: No    Review of Systems  Constitutional: No fever/chills Eyes: No visual changes. ENT: No sore throat. Cardiovascular: Denies chest pain. Respiratory: Denies shortness of breath. Gastrointestinal: No abdominal pain.  No nausea, no vomiting.  No diarrhea.  No constipation. Genitourinary: Negative for dysuria. Musculoskeletal: Positive for chronic left hip pain.  Negative for  back pain. Skin: Negative for rash. Neurological: Negative for headaches, focal weakness or numbness.   ____________________________________________   PHYSICAL EXAM:  VITAL SIGNS: ED Triage Vitals  Enc Vitals Group     BP 02/17/19 0040 (!) 169/100     Pulse Rate 02/17/19 0040 62     Resp 02/17/19 0040 17     Temp 02/17/19 0043 98.3 F (36.8 C)     Temp Source 02/17/19 0043 Oral     SpO2 02/17/19 0040 98 %     Weight 02/17/19 0044 215 lb (97.5 kg)     Height 02/17/19 0044 6' (1.829 m)  Head Circumference --      Peak Flow --      Pain Score 02/17/19 0044 0     Pain Loc --      Pain Edu? --      Excl. in GC? --     Constitutional: Alert and oriented. Well appearing and in no acute distress. Eyes: Conjunctivae are normal. PERRL. EOMI. Head: Atraumatic. Nose: Atraumatic. Mouth/Throat: Mucous membranes are moist.  No dental malocclusion. Neck: No stridor.  No cervical spine tenderness to palpation. Cardiovascular: Normal rate, regular rhythm. Grossly normal heart sounds.  Good peripheral circulation. Respiratory: Normal respiratory effort.  No retractions. Lungs CTAB.  Slight abrasion to anterior chest. Gastrointestinal: Soft and nontender light or deep palpation. No distention. No abdominal bruits. No CVA tenderness. Musculoskeletal: No spinal tenderness to palpation.  Mild lateral hip pain to the left which patient states is chronic.  Full range of motion with some pain.  No  edema.  No joint effusions. Neurologic: Oriented x3.  CN II-XII grossly intact.  MAEx4. Normal speech and language. No gross focal neurologic deficits are appreciated.  Skin:  Skin is warm, dry and intact. No rash noted. Psychiatric: Mood and affect are normal. Speech and behavior are normal.  ____________________________________________   LABS (all labs ordered are listed, but only abnormal results are displayed)  Labs Reviewed  CBC WITH DIFFERENTIAL/PLATELET - Abnormal; Notable for the  following components:      Result Value   WBC 10.7 (*)    Neutro Abs 8.8 (*)    All other components within normal limits  URINALYSIS, COMPLETE (UACMP) WITH MICROSCOPIC - Abnormal; Notable for the following components:   Color, Urine YELLOW (*)    APPearance CLEAR (*)    All other components within normal limits  BASIC METABOLIC PANEL - Abnormal; Notable for the following components:   Glucose, Bld 125 (*)    All other components within normal limits  SARS CORONAVIRUS 2 (HOSPITAL ORDER, PERFORMED IN [facility])  TROPONIN I  CARBAMAZEPINE LEVEL, TOTAL   ____________________________________________  EKG  ED ECG REPORT I, Borden Thune J, the attending physician, personally viewed and interpreted this ECG.   Date: 02/17/2019  EKG Time: 0040  Rate: 72  Rhythm: Atrial fibrillation  Axis: LAD  Intervals: LAFB  ST&T Change: Nonspecific  ____________________________________________  RADIOLOGY  ED MD interpretation: No acute cardiopulmonary process  Official radiology report(s): Ct Head Wo Contrast  Result Date: 02/17/2019 CLINICAL DATA:  78 year old male with history of tremors. Fall. Prior history of stroke. EXAM: CT HEAD WITHOUT CONTRAST TECHNIQUE: Contiguous axial images were obtained from the base of the skull through the vertex without intravenous contrast. COMPARISON:  MRI of the brain 11/23/2015.  Head CT 04/28/2010. FINDINGS: Brain: Mild cerebral atrophy. Patchy and confluent areas of decreased attenuation are noted throughout the deep and periventricular white matter of the cerebral hemispheres bilaterally, compatible with chronic microvascular ischemic disease. Well-defined area of low attenuation in the left cerebellar hemisphere (axial image 10 of series 2), compatible with an old infarct. No evidence of acute infarction, hemorrhage, hydrocephalus, extra-axial collection or mass lesion/mass effect. Vascular: No hyperdense vessel or unexpected calcification.  Skull: Normal. Negative for fracture or focal lesion. Sinuses/Orbits: No acute finding. Other: None. IMPRESSION: 1. No acute intracranial abnormalities. 2. Mild cerebral atrophy with chronic microvascular ischemic changes in the cerebral white matter bilaterally and old left cerebellar infarct, as above. Electronically Signed   By: Trudie Reed M.D.   On: 02/17/2019 05:54  Dg Chest Port 1 View  Result Date: 02/17/2019 CLINICAL DATA:  Pain status post fall EXAM: PORTABLE CHEST 1 VIEW COMPARISON:  Chest x-ray dated 09/12/2012. FINDINGS: The cardiac size is increased. There is no pneumothorax. No large pleural effusion. No acute osseous abnormality. IMPRESSION: No active disease. Electronically Signed   By: Katherine Mantle M.D.   On: 02/17/2019 01:20    ____________________________________________   PROCEDURES  Procedure(s) performed (including Critical Care):  Procedures   ____________________________________________   INITIAL IMPRESSION / ASSESSMENT AND PLAN / ED COURSE  As part of my medical decision making, I reviewed the following data within the electronic MEDICAL RECORD NUMBER Nursing notes reviewed and incorporated, Labs reviewed, EKG interpreted, Old chart reviewed, Radiograph reviewed and Notes from prior ED visits     Sonia E Astacio was evaluated in Emergency Department on 02/17/2019 for the symptoms described in the history of present illness. He was evaluated in the context of the global COVID-19 pandemic, which necessitated consideration that the patient might be at risk for infection with the SARS-CoV-2 virus that causes COVID-19. Institutional protocols and algorithms that pertain to the evaluation of patients at risk for COVID-19 are in a state of rapid change based on information released by regulatory bodies including the CDC and federal and state organizations. These policies and algorithms were followed during the patient's care in the ED.   78 year old male who  presents status post mechanical fall secondary to legs being weak and tremulous.  No focal neurological deficits to suggest CVA.  Patient has ongoing issues with left labral tear as well as lumbar radiculitis.  Patient does not desire imaging of his left hip as he states pain is chronic.  Will obtain basic lab work and chest x-ray.  Will reassess.   Clinical Course as of Feb 17 624  Mon Feb 17, 2019  0345 Updated patient of all results.  Complaining of left hip pain that he has been having for the past 6 weeks.  Also gives more detail regarding his "tremors".  Does not sound like patient had a seizure; however, patient does have a seizure history and takes carbamazepine.  Will obtain level.   [JS]  [id number] Noted atrial fibrillation which patient denies having a history of. Will obtain CT Head; patient has had prior CVA. Will discuss with hospitalist for admission.   [JS]  0557 CT head negative for acute ICH.  Will discuss case with hospitalist services to evaluate patient in emergency department for admission.   [JS]    Clinical Course User Index [JS] Irean Hong, MD     ____________________________________________   FINAL CLINICAL IMPRESSION(S) / ED DIAGNOSES  Final diagnoses:  Fall, initial encounter  New onset atrial fibrillation (HCC)  Weakness     ED Discharge Orders    None       Note:  This document was prepared using Dragon voice recognition software and may include unintentional dictation errors.   Irean Hong, MD 02/17/19 [phone]

## 2019-02-17 NOTE — ED Notes (Signed)
Pt given warm blankets.

## 2019-02-17 NOTE — ED Triage Notes (Signed)
Pt in via EMS from home d/t tremors. Pt states tremors caused pt to fall to knees then to buttocks. Pt denies pain and denies hitting head. Pt A&Ox4. History CVA 2002 and cardiac arrest during surgery. NSR/96% RA/BG148/138/85 BP/80HR with EMS.

## 2019-02-17 NOTE — Progress Notes (Signed)
PT Cancellation Note  Patient Details Name: [name] MRN: [id number] DOB: 09-01-1941   Cancelled Treatment:    Reason Eval/Treat Not Completed: Other (comment)(Patient has cardiology consult. Will hold physical therapy evaluation till later date/time until cleared by cardiology.)  Precious Bard, PT, DPT   02/17/2019, 9:36 AM

## 2019-02-17 NOTE — H&P (Signed)
[facility] [place] [facility]   PATIENT NAME: [name]    MR#:  [id number]  DATE OF BIRTH:  Aug 24, 1941  DATE OF ADMISSION:  02/17/2019  PRIMARY CARE PHYSICIAN: Jerl Mina, MD   REQUESTING/REFERRING PHYSICIAN: Chiquita Loth, MD  CHIEF COMPLAINT:   Chief Complaint  Patient presents with  . Tremors    HISTORY OF PRESENT ILLNESS:  [name]  is a 78 y.o. male with a known history of hypertension, hyperlipidemia, history of subarachnoid hemorrhage, history of stroke with no residual deficits, seizures who presented to the ED with weakness and multiple mechanical falls at home.  He has been seen ED multiple times over the last couple of weeks with falls.  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  This evening around midnight, he was trying to get out of bed to use the bathroom.  He grabbed onto his walker but was so weak that he fell onto his hands and knees.  He denies any head trauma.  He states that he scraped up his right hand, but otherwise he is fine.  He endorses worsening weakness over the last couple of months.  He denies any chest pain, shortness of breath, palpitations, lower extremity edema.  In the ED, he was noted to be in atrial fibrillation with heart rates in the 70s to 80s.  Labs were unremarkable.  CT head was negative.  Given his generalized weakness, multiple falls, and new onset of atrial fibrillation, hospitalists were called for admission.  PAST MEDICAL HISTORY:   Past Medical History:  Diagnosis Date  . Arthritis    "lower back on down; into all joints; into my feet" (2012-10-11)  . Chronic low back pain    hx of  . Complication of anesthesia    "cardiac arrest with dye from lexiscan [04/15/10 records indicate bradycardia, no ischemia, EF 56%], unstable BP with anesthesia[  . GERD (gastroesophageal  reflux disease)   . 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 Herniated disc, cervical   . Hypercholesteremia   . Hypertension    sees Dr. Jerl Mina, [facility] in [place]  . Intracranial bleed [facility])    december 2010  . Kidney stones    "passed on their own" (2012-10-11)  . Migraines    "2 in my life" (10-11-12)  . Retina disorder    hx of torn retina  . Stroke (HCC) 2002   LLE "just a little bit weaker" (October 11, 2012)    PAST SURGICAL HISTORY:   Past Surgical History:  Procedure Laterality Date  . BLEPHAROPLASTY  1990's   "right eye" (09/16/2012)  . CARDIOVASCULAR STRESS TEST     nl perfusion w/o ischemia, EF 56%, bradycardia without syncope 04/15/10 (Dr. Harold Hedge)  . CATARACT EXTRACTION W/ INTRAOCULAR LENS IMPLANT  1980's   "right" (10-11-2012)  . COLONOSCOPY     06/26/09  . EYE MUSCLE SURGERY  1980's   "right eye" (10-11-12)  . INGUINAL HERNIA REPAIR  04/10/2011   "right" (Oct 11, 2012)  . JOINT REPLACEMENT    . POSTERIOR LUMBAR FUSION  09/16/2012   "L4-5" (11-Oct-2012)  . RETINAL DETACHMENT SURGERY  1980's   "right" (10/11/2012)  . TOTAL KNEE ARTHROPLASTY  04/28/2010   "left" (10/11/2012)    SOCIAL HISTORY:   Social History   Tobacco Use  . Smoking status: Never  Smoker  . Smokeless tobacco: Never Used  Substance Use Topics  . Alcohol use: No    FAMILY HISTORY:  Father- heart disease  DRUG ALLERGIES:   Allergies  Allergen Reactions  . Codeine Nausea Only  . Fentanyl Other (See Comments)    Given before knee surgery, blood pressure became extremely low  . Lexiscan [Regadenoson] Other (See Comments)    Created cardiac arrest    REVIEW OF SYSTEMS:   Review of Systems  Constitutional: Negative for chills and fever.  HENT: Negative for congestion and sore throat.   Eyes: Negative for blurred vision and double vision.  Respiratory: Negative for cough and shortness of breath.   Cardiovascular: Negative for chest pain,  palpitations and leg swelling.  Gastrointestinal: Negative for nausea and vomiting.  Genitourinary: Negative for dysuria and urgency.  Musculoskeletal: Positive for back pain, falls and joint pain. Negative for neck pain.  Neurological: Positive for weakness. Negative for dizziness, focal weakness and headaches.  Psychiatric/Behavioral: Negative for depression. The patient is not nervous/anxious.     MEDICATIONS AT HOME:   Prior to Admission medications   Medication Sig Start Date End Date Taking? Authorizing Provider  aliskiren (TEKTURNA) 300 MG tablet Take 300 mg by mouth daily.     [provider]  aspirin 325 MG tablet Take 325 mg by mouth daily.    [provider]  carbamazepine (TEGRETOL) 200 MG tablet Take 200 mg by mouth 2 (two) times a day.     [provider]  cetirizine (ZYRTEC) 10 MG tablet Take 10 mg by mouth daily as needed for allergies.     [provider]  doxazosin (CARDURA) 1 MG tablet Take 1 mg by mouth at bedtime.     [provider]  famotidine (PEPCID) 20 MG tablet Take 1 tablet (20 mg total) by mouth daily. 01/30/19 01/30/20  Willy Eddy, MD  furosemide (LASIX) 20 MG tablet Take 20 mg by mouth daily.     [provider]  HYDROcodone-acetaminophen (NORCO) 5-325 MG tablet Take 1 tablet by mouth every 4 (four) hours as needed for moderate pain. 01/30/19   Willy Eddy, MD  irbesartan (AVAPRO) 300 MG tablet Take 300 mg by mouth daily. 11/19/17   [provider]  naproxen (NAPROSYN) 500 MG tablet Take 1 tablet (500 mg total) by mouth 2 (two) times daily with a meal. 01/27/19   Jene Every, MD  Omega-3 Fatty Acids (FISH OIL) 1200 MG CAPS Take 1,200 mg by mouth daily.    [provider]  pantoprazole (PROTONIX) 40 MG tablet Take 40 mg by mouth at bedtime.     [provider]  predniSONE (DELTASONE) 10 MG tablet Take 1 tablet (10 mg total) by mouth daily. Day 1-2: Take 50 mg  ( 5 pills) Day  3-4 : Take 40 mg (4pills) Day 5-6: Take 30 mg (3 pills) Day 7-8:  Take 20 mg (2 pills) Day 9:  Take  (1 pill) 01/30/19   Willy Eddy, MD  propranolol (INDERAL) 80 MG tablet Take 40 mg by mouth 3 (three) times daily.     [provider]  simvastatin (ZOCOR) 20 MG tablet Take 20 mg by mouth 2 (two) times daily.     [provider]  traMADol (ULTRAM) 50 MG tablet Take 1 tablet (50 mg total) by mouth every 6 (six) hours as needed. Patient taking differently: Take 50 mg by mouth every 6 (six) hours.  01/27/19 01/27/20  Willy Eddy, MD  verapamil (  CALAN) 120 MG tablet Take 120 mg by mouth 3 (three) times daily.     [provider]  vitamin B-12 (CYANOCOBALAMIN) 1000 MCG tablet Take 1,000 mcg by mouth daily.    [provider]      VITAL SIGNS:  Blood pressure (!) 150/95, pulse 63, temperature 98.3 F (36.8 C), temperature source Oral, resp. rate 18, height 6' (1.829 m), weight 97.5 kg, SpO2 94 %.  PHYSICAL EXAMINATION:  Physical Exam  GENERAL:  78 y.o.-year-old patient lying in the bed with no acute distress.  EYES: Pupils equal, round, reactive to light and accommodation. No scleral icterus. Extraocular muscles intact.  HEENT: Head atraumatic, normocephalic. Oropharynx and nasopharynx clear.  NECK:  Supple, no jugular venous distention. No thyroid enlargement, no tenderness.  LUNGS: Normal breath sounds bilaterally, no wheezing, rales,rhonchi or crepitation. No use of accessory muscles of respiration.  CARDIOVASCULAR: Irregularly irregular rhythm, regular rate, S1, S2 normal. No murmurs, rubs, or gallops.  ABDOMEN: Soft, nontender, nondistended. Bowel sounds present. No organomegaly or mass.  EXTREMITIES: No pedal edema, cyanosis, or clubbing.  NEUROLOGIC: Cranial nerves II through XII are intact. + Global weakness. Sensation intact. Gait not checked.  PSYCHIATRIC: The patient is alert and oriented x 3.  SKIN: No obvious rash, lesion, or ulcer.    LABORATORY PANEL:   CBC Recent Labs  Lab 02/17/19 0138  WBC 10.7*  HGB 14.0  HCT 41.8  PLT 172   ------------------------------------------------------------------------------------------------------------------  Chemistries  Recent Labs  Lab 02/17/19 0215  NA 136  K 4.4  CL 100  CO2 29  GLUCOSE 125*  BUN 16  CREATININE 0.93  CALCIUM 9.5   ------------------------------------------------------------------------------------------------------------------  Cardiac Enzymes Recent Labs  Lab 02/17/19 0215  TROPONINI <0.03   ------------------------------------------------------------------------------------------------------------------  RADIOLOGY:  Ct Head Wo Contrast  Result Date: 02/17/2019 CLINICAL DATA:  78 year old male with history of tremors. Fall. Prior history of stroke. EXAM: CT HEAD WITHOUT CONTRAST TECHNIQUE: Contiguous axial images were obtained from the base of the skull through the vertex without intravenous contrast. COMPARISON:  MRI of the brain 11/23/2015.  Head CT 04/28/2010. FINDINGS: Brain: Mild cerebral atrophy. Patchy and confluent areas of decreased attenuation are noted throughout the deep and periventricular white matter of the cerebral hemispheres bilaterally, compatible with chronic microvascular ischemic disease. Well-defined area of low attenuation in the left cerebellar hemisphere (axial image 10 of series 2), compatible with an old infarct. No evidence of acute infarction, hemorrhage, hydrocephalus, extra-axial collection or mass lesion/mass effect. Vascular: No hyperdense vessel or unexpected calcification. Skull: Normal. Negative for fracture or focal lesion. Sinuses/Orbits: No acute finding. Other: None. IMPRESSION: 1. No acute intracranial abnormalities. 2. Mild cerebral atrophy with chronic microvascular ischemic changes in the cerebral white matter bilaterally and old left cerebellar infarct, as above. Electronically Signed   By: Trudie Reedaniel   Entrikin M.D.   On: 02/17/2019 05:54   Dg Chest Port 1 View  Result Date: 02/17/2019 CLINICAL DATA:  Pain status post fall EXAM: PORTABLE CHEST 1 VIEW COMPARISON:  Chest x-ray dated 09/12/2012. FINDINGS: The cardiac size is increased. There is no pneumothorax. No large pleural effusion. No acute osseous abnormality. IMPRESSION: No active disease. Electronically Signed   By: Katherine Mantlehristopher  Green M.D.   On: 02/17/2019 01:20      IMPRESSION AND PLAN:   New onset atrial fibrillation- currently rate controlled. CHADS-VASc score is a 5. HAS-BLED score is a 3. Patient does have a history of subarachnoid hemorrhage in 2010 while on plavix and has had multiple falls at  home. -Will hold on anticoagulation for now -Cardiology consult -ECHO -Trend troponins -Continue home aspirin 325 daily -Cardiac monitoring  Multiple mechanical falls- likely due to deconditioning. No focal deficits to suggest stroke. CT head negative. -PT consult  Hypertension- blood pressures mildly elevated in the ED -Continue home aliskiren, doxazosin, irbesartan, propranolol, verapamil  Hyperlipidemia- stable -Continue home simvastatin  Recent labral tear of left hip -Continue home norco -Stop tramadol with history of seizures -Needs to f/u with orthopedic surgery as an outpatient  History of stroke- no residual deficits -Continue aspirin  History of seizures -Continue home carbamazepine -Stop tramadol  All the records are reviewed and case discussed with ED provider. Management plans discussed with the patient, family and they are in agreement.  CODE STATUS: Full  TOTAL TIME TAKING CARE OF THIS PATIENT: 45 minutes.    Jinny Blossom Deaven Barron M.D on 02/17/2019 at 6:02 AM  Between 7am to 6pm - Pager - [phone]  After 6pm go to [email or web address] - Social research officer, government  [facility]  Office  [phone]  CC: Primary care physician; Jerl Mina, MD   Note: This dictation was prepared  with Dragon dictation along with smaller phrase technology. Any transcriptional errors that result from this process are unintentional.

## 2019-02-17 NOTE — ED Notes (Signed)
Pt repositioned in bed.

## 2019-02-17 NOTE — ED Notes (Signed)
Pt to ct scan.

## 2019-02-17 NOTE — ED Notes (Signed)
Pt states he urinated right before arrival. States will use call bell to notify thsi RN when he can provide urine sample.

## 2019-02-17 NOTE — ED Notes (Addendum)
Red/grn recollected an sent to lab. Pt provided peri care to self. Linens changed. Given dry boxers.

## 2019-02-17 NOTE — ED Notes (Signed)
Attempted to call report, unable to give report at this time  

## 2019-02-17 NOTE — Consult Note (Signed)
Cardiology Consultation:   Patient ID: [name] MRN: [id number]; DOB: 08-26-1941  Admit date: 02/17/2019 Date of Consult: 02/17/2019  Primary Care Provider: Jerl Mina, MD Primary Cardiologist: No primary care provider on file.  new Primary Electrophysiologist:  None    Patient Profile:   [name] is a 78 y.o. male with multiple medical problems who is being seen today for the evaluation of atrial fibrillation at the request of Dr. Nancy Marus.  History of Present Illness:   [name] is a 78 year old male with history of hypertension, hyperlipidemia, subarachnoid hemorrhage, previous stroke and seizures.  He presented to the ED with weakness and multiple mechanical falls at home.  He had multiple recent presentation to the ED starting in early May with left hip pain after a fall.  X-rays did not show fractures and he returned with more pain.  MRI showed large tear of the left superior labrum.  MRI of the spine showed large L2-L3 disc extrusion.  Was referred to outpatient orthopedics and neurosurgery.  The patient presented with weakness and a fall after he was trying to use his walker.  He had no chest pain, shortness of breath, dizziness or syncope.  He was noted to be in atrial fibrillation in the ED with controlled ventricular rate .  He is not aware of previous cardiac issues.  Past Medical History:  Diagnosis Date   Arthritis    "lower back on down; into all joints; into my feet" (09-24-12)   Chronic low back pain    hx of   Complication of anesthesia    "cardiac arrest with dye from lexiscan [04/15/10 records indicate bradycardia, no ischemia, EF 56%], unstable BP with anesthesia[   GERD (gastroesophageal reflux disease)    Grand mal 1987; 1989   "take RX daily" (09-24-12)   Herniated disc, cervical    Hypercholesteremia    Hypertension    sees Dr. Jerl Mina, [facility] in [place]   Intracranial bleed [facility])    december 2010   Kidney  stones    "passed on their own" (Sep 24, 2012)   Migraines    "2 in my life" (09/24/2012)   Retina disorder    hx of torn retina   Stroke (HCC) 2002   LLE "just a little bit weaker" (09-24-2012)    Past Surgical History:  Procedure Laterality Date   BLEPHAROPLASTY  1990's   "right eye" (09/16/2012)   CARDIOVASCULAR STRESS TEST     nl perfusion w/o ischemia, EF 56%, bradycardia without syncope 04/15/10 (Dr. Harold Hedge)   CATARACT EXTRACTION W/ INTRAOCULAR LENS IMPLANT  1980's   "right" (09-24-2012)   COLONOSCOPY     06/26/09   EYE MUSCLE SURGERY  1980's   "right eye" (Sep 24, 2012)   INGUINAL HERNIA REPAIR  04/10/2011   "right" (09-24-2012)   JOINT REPLACEMENT     POSTERIOR LUMBAR FUSION  09/16/2012   "L4-5" (2012/09/24)   RETINAL DETACHMENT SURGERY  1980's   "right" (2012/09/24)   TOTAL KNEE ARTHROPLASTY  04/28/2010   "left" (09/24/2012)     Home Medications:  Prior to Admission medications   Medication Sig Start Date End Date Taking? Authorizing Provider  aliskiren (TEKTURNA) 300 MG tablet Take 300 mg by mouth daily.    Yes [provider]  aspirin 325 MG tablet Take 325 mg by mouth daily.   Yes [provider]  carbamazepine (TEGRETOL) 200 MG tablet Take 200 mg by mouth 2 (two) times a day.    Yes [provider]  cetirizine (ZYRTEC) 10 MG tablet Take 10 mg by mouth daily as needed for allergies.    Yes [provider]  doxazosin (CARDURA) 1 MG tablet Take 1 mg by mouth at bedtime.    Yes [provider]  famotidine (PEPCID) 20 MG tablet Take 1 tablet (20 mg total) by mouth daily. 01/30/19 01/30/20 Yes Willy Eddy, MD  furosemide (LASIX) 20 MG tablet Take 20 mg by mouth daily.    Yes [provider]  HYDROcodone-acetaminophen (NORCO) 5-325 MG tablet Take 1 tablet by mouth every 4 (four) hours as needed for moderate pain. 01/30/19  Yes Willy Eddy, MD  irbesartan (AVAPRO) 300 MG tablet Take 300 mg by mouth  daily. 11/19/17  Yes [provider]  naproxen (NAPROSYN) 500 MG tablet Take 1 tablet (500 mg total) by mouth 2 (two) times daily with a meal. 01/27/19  Yes Jene Every, MD  Omega-3 Fatty Acids (FISH OIL) 1200 MG CAPS Take 1,200 mg by mouth daily.   Yes [provider]  pantoprazole (PROTONIX) 40 MG tablet Take 40 mg by mouth at bedtime.    Yes [provider]  propranolol (INDERAL) 80 MG tablet Take 40 mg by mouth 3 (three) times daily.    Yes [provider]  simvastatin (ZOCOR) 20 MG tablet Take 20 mg by mouth 2 (two) times daily.    Yes [provider]  traMADol (ULTRAM) 50 MG tablet Take 1 tablet (50 mg total) by mouth every 6 (six) hours as needed. Patient taking differently: Take 50 mg by mouth every 6 (six) hours.  01/27/19 01/27/20 Yes Willy Eddy, MD  verapamil (CALAN) 120 MG tablet Take 120 mg by mouth 3 (three) times daily.    Yes [provider]  vitamin B-12 (CYANOCOBALAMIN) 1000 MCG tablet Take 1,000 mcg by mouth daily.   Yes [provider]  predniSONE (DELTASONE) 10 MG tablet Take 1 tablet (10 mg total) by mouth daily. Day 1-2: Take 50 mg  ( 5 pills) Day 3-4 : Take 40 mg (4pills) Day 5-6: Take 30 mg (3 pills) Day 7-8:  Take 20 mg (2 pills) Day 9:  Take 10mg  (1 pill) Patient not taking: Reported on 02/17/2019 01/30/19   Willy Eddy, MD    Inpatient Medications: Scheduled Meds:  aliskiren  300 mg Oral Daily   aspirin  325 mg Oral Daily   carbamazepine  200 mg Oral BID   doxazosin  1 mg Oral QHS   enoxaparin (LOVENOX) injection  40 mg Subcutaneous Q24H   famotidine  20 mg Oral Daily   furosemide  20 mg Oral Daily   irbesartan  300 mg Oral Daily   loratadine  10 mg Oral Daily   pantoprazole  40 mg Oral QHS   propranolol  40 mg Oral TID   simvastatin  20 mg Oral BID   verapamil  120 mg Oral TID   vitamin B-12  1,000 mcg Oral Daily   Continuous Infusions:  PRN Meds: acetaminophen,  hydrALAZINE, HYDROcodone-acetaminophen, ondansetron (ZOFRAN) IV  Allergies:    Allergies  Allergen Reactions   Codeine Nausea Only   Fentanyl Other (See Comments)    Given before knee surgery, blood pressure became extremely low   Lexiscan [Regadenoson] Other (See Comments)    Created cardiac arrest    Social History:   Social History   Socioeconomic History   Marital status: Married    Spouse name: Not on file   Number of children: Not on file  Years of education: Not on file   Highest education level: Not on file  Occupational History   Not on file  Social Needs   Financial resource strain: Not on file   Food insecurity:    Worry: Not on file    Inability: Not on file   Transportation needs:    Medical: Not on file    Non-medical: Not on file  Tobacco Use   Smoking status: Never Smoker   Smokeless tobacco: Never Used  Substance and Sexual Activity   Alcohol use: No   Drug use: No   Sexual activity: Not Currently  Lifestyle   Physical activity:    Days per week: Not on file    Minutes per session: Not on file   Stress: Not on file  Relationships   Social connections:    Talks on phone: Not on file    Gets together: Not on file    Attends religious service: Not on file    Active member of club or organization: Not on file    Attends meetings of clubs or organizations: Not on file    Relationship status: Not on file   Intimate partner violence:    Fear of current or ex partner: Not on file    Emotionally abused: Not on file    Physically abused: Not on file    Forced sexual activity: Not on file  Other Topics Concern   Not on file  Social History Narrative   Not on file    Family History:   History reviewed. No pertinent family history.   ROS:  Please see the history of present illness.   All other ROS reviewed and negative.     Physical Exam/Data:   Vitals:   02/17/19 0500 02/17/19 0600 02/17/19 0800 02/17/19 0937  BP:   114/89 (!) 166/83 (!) 198/123  Pulse: 63 60 73 85  Resp:      Temp:    97.8 F (36.6 C)  TempSrc:    Oral  SpO2: 94% 98% 98% 98%  Weight:      Height:       No intake or output data in the 24 hours ending 02/17/19 1631 Last 3 Weights 02/17/2019 01/30/2019 01/27/2019  Weight (lbs) 215 lb 220 lb 220 lb  Weight (kg) 97.523 kg 99.791 kg 99.791 kg     Body mass index is 29.16 kg/m.  General:  Well nourished, well developed, in no acute distress HEENT: normal Lymph: no adenopathy Neck: no JVD Endocrine:  No thryomegaly Vascular: No carotid bruits; FA pulses 2+ bilaterally without bruits  Cardiac:  normal S1, S2; irregularly irregular; no murmur  Lungs:  clear to auscultation bilaterally, no wheezing, rhonchi or rales  Abd: soft, nontender, no hepatomegaly  Ext: no edema Musculoskeletal:  No deformities, BUE and BLE strength normal and equal Skin: warm and dry  Neuro:  CNs 2-12 intact, no focal abnormalities noted Psych:  Normal affect   EKG:  The EKG was personally reviewed and demonstrates: 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 The left ventricle has normal systolic function, with an ejection fraction of 55-60%. The cavity size was normal. There is mild concentric left ventricular hypertrophy. Left ventricular diastolic Doppler parameters are indeterminate.  2. The right ventricle has normal systolic function. The cavity was normal. There is no increase in right  ventricular wall thickness. Right ventricular systolic pressure is moderately elevated with an estimated pressure of 59.5 mmHg.  3. Left atrial size was mildly dilated.  4. The mitral valve is grossly normal. Mitral valve regurgitation is mild to moderate by color flow Doppler.  5. The aortic valve is grossly normal. Mild thickening of the aortic  valve. Mild calcification of the aortic valve. Aortic valve regurgitation is mild by color flow Doppler.  If  Laboratory Data:  Chemistry Recent Labs  Lab 02/17/19 0215  NA 136  K 4.4  CL 100  CO2 29  GLUCOSE 125*  BUN 16  CREATININE 0.93  CALCIUM 9.5  GFRNONAA >60  GFRAA >60  ANIONGAP 7    No results for input(s): PROT, ALBUMIN, AST, ALT, ALKPHOS, BILITOT in the last 168 hours. Hematology Recent Labs  Lab 02/17/19 0138  WBC 10.7*  RBC 4.34  HGB 14.0  HCT 41.8  MCV 96.3  MCH 32.3  MCHC 33.5  RDW 12.6  PLT 172   Cardiac Enzymes Recent Labs  Lab 02/17/19 0215 02/17/19 0906  TROPONINI <0.03 <0.03   No results for input(s): TROPIPOC in the last 168 hours.  BNPNo results for input(s): BNP, PROBNP in the last 168 hours.  DDimer No results for input(s): DDIMER in the last 168 hours.  Radiology/Studies:  Ct Head Wo Contrast  Result Date: 02/17/2019 CLINICAL DATA:  78 year old male with history of tremors. Fall. Prior history of stroke. EXAM: CT HEAD WITHOUT CONTRAST TECHNIQUE: Contiguous axial images were obtained from the base of the skull through the vertex without intravenous contrast. COMPARISON:  MRI of the brain 11/23/2015.  Head CT 04/28/2010. FINDINGS: Brain: Mild cerebral atrophy. Patchy and confluent areas of decreased attenuation are noted throughout the deep and periventricular white matter of the cerebral hemispheres bilaterally, compatible with chronic microvascular ischemic disease. Well-defined area of low attenuation in the left cerebellar hemisphere (axial image 10 of series 2), compatible with an old infarct. No evidence of acute infarction, hemorrhage, hydrocephalus, extra-axial collection or mass lesion/mass effect. Vascular: No hyperdense vessel or unexpected calcification. Skull: Normal. Negative for fracture or focal lesion. Sinuses/Orbits: No acute finding. Other: None. IMPRESSION: 1. No acute intracranial abnormalities. 2. Mild cerebral atrophy with  chronic microvascular ischemic changes in the cerebral white matter bilaterally and old left cerebellar infarct, as above. Electronically Signed   By: Trudie Reed M.D.   On: 02/17/2019 05:54   Dg Chest Port 1 View  Result Date: 02/17/2019 CLINICAL DATA:  Pain status post fall EXAM: PORTABLE CHEST 1 VIEW COMPARISON:  Chest x-ray dated 09/12/2012. FINDINGS: The cardiac size is increased. There is no pneumothorax. No large pleural effusion. No acute osseous abnormality. IMPRESSION: No active disease. Electronically Signed   By: Katherine Mantle M.D.   On: 02/17/2019 01:20    Assessment and Plan:   1. Newly diagnosed atrial fibrillation: Unknown onset given that he does not seem to be asymptomatic.  Ventricular rate is controlled likely because he is already on verapamil and propranolol.  Chads vas score is 5 and thus ideally he should be on long-term anticoagulation.  However, the patient has history of subarachnoid hemorrhage in 2010 and was told not to go on any blood thinner in the future although he has been tolerating full dose aspirin daily.  The other complicating issue is recurrent falls over the last few weeks which resulted in multiple injuries.  Thus, I think the risks of anticoagulation outweigh the benefit but we might need to readdress this  in the outpatient based on his progress.  It will also be helpful to get an opinion from neurosurgery regarding the risk of subarachnoid hemorrhage on anticoagulation.  No plans for cardioversion.  Recommend continued rate control for now. 2. Multiple falls recently: Continue care per orthopedics and neurosurgery. 3. Essential hypertension: The patient is on multiple antihypertensive medications blood pressure was mildly elevated.      For questions or updates, please contact [facility] Please consult [email or web address] for contact info under     Signed, Lorine Bears, MD  02/17/2019 4:31 PM

## 2019-02-17 NOTE — Progress Notes (Signed)
*  PRELIMINARY RESULTS* Echocardiogram 2D Echocardiogram has been performed.  [name] 02/17/2019, 1:48 PM 

## 2019-02-17 NOTE — ED Notes (Signed)
Pt states he took his BP meds yesterday morning 5/24.

## 2019-02-17 NOTE — Progress Notes (Signed)
Family Meeting Note  Advance Directive:yes  Today a meeting took place with the Patient.  Patient is able to participate.  The following clinical team members were present during this meeting:MD  The following were discussed:Patient's diagnosis: atrial fibrillation, Patient's progosis: Unable to determine and Goals for treatment: Full Code  Additional follow-up to be provided: prn  Time spent during discussion:20 minutes  Zachary Sinclair, MD

## 2019-02-17 NOTE — ED Notes (Signed)
Pt repositioned again. Pt prefers to lay on R side.

## 2019-02-17 NOTE — ED Notes (Signed)
EKG completed

## 2019-02-17 NOTE — ED Notes (Signed)
ED TO INPATIENT HANDOFF REPORT  ED Nurse Name and Phone #: Edith Groleau [phone]   S Name/Age/Gender [name] 78 y.o. male Room/Bed: ED08A/ED08A  Code Status   Code Status: Not on file  Home/SNF/Other Home Patient oriented to: self, place and situation Is this baseline? No   Triage Complete: Triage complete  Chief Complaint Tremors; Possible Fall  Triage Note Pt in via EMS from home d/t tremors. Pt states tremors caused pt to fall to knees then to buttocks. Pt denies pain and denies hitting head. Pt A&Ox4. History CVA 2002 and cardiac arrest during surgery. NSR/96% RA/BG148/138/85 BP/80HR with EMS.    Allergies Allergies  Allergen Reactions  . Codeine Nausea Only  . Fentanyl Other (See Comments)    Given before knee surgery, blood pressure became extremely low  . Lexiscan [Regadenoson] Other (See Comments)    Created cardiac arrest    Level of Care/Admitting Diagnosis ED Disposition    ED Disposition Condition Comment   Admit  Hospital Area: [facility] [[id number]]  Level of Care: Telemetry [5]  Covid Evaluation: N/A  Diagnosis: Atrial fibrillation (HCC) [427.31.ICD-9-CM]  Admitting Physician: Willadean Carol DODD [[id number]]  Attending Physician: Willadean Carol DODD [[id number]]  PT Class (Do Not Modify): Observation [104]  PT Acc Code (Do Not Modify): Observation [10022]       B Medical/Surgery History Past Medical History:  Diagnosis Date  . Arthritis    "lower back on down; into all joints; into my feet" (2012/10/11)  . Chronic low back pain    hx of  . Complication of anesthesia    "cardiac arrest with dye from lexiscan [04/15/10 records indicate bradycardia, no ischemia, EF 56%], unstable BP with anesthesia[  . GERD (gastroesophageal reflux disease)   . 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 Herniated disc, cervical   . Hypercholesteremia   . Hypertension    sees Dr. Jerl Mina, [facility] in [place]  . Intracranial  bleed [facility])    december 2010  . Kidney stones    "passed on their own" (October 11, 2012)  . Migraines    "2 in my life" (2012-10-11)  . Retina disorder    hx of torn retina  . Stroke (HCC) 2002   LLE "just a little bit weaker" (2012/10/11)   Past Surgical History:  Procedure Laterality Date  . BLEPHAROPLASTY  1990's   "right eye" (09/16/2012)  . CARDIOVASCULAR STRESS TEST     nl perfusion w/o ischemia, EF 56%, bradycardia without syncope 04/15/10 (Dr. Harold Hedge)  . CATARACT EXTRACTION W/ INTRAOCULAR LENS IMPLANT  1980's   "right" (2012/10/11)  . COLONOSCOPY     06/26/09  . EYE MUSCLE SURGERY  1980's   "right eye" (October 11, 2012)  . INGUINAL HERNIA REPAIR  04/10/2011   "right" (October 11, 2012)  . JOINT REPLACEMENT    . POSTERIOR LUMBAR FUSION  09/16/2012   "L4-5" (10/11/2012)  . RETINAL DETACHMENT SURGERY  1980's   "right" (10/11/12)  . TOTAL KNEE ARTHROPLASTY  04/28/2010   "left" (10/11/12)     A IV Location/Drains/Wounds Patient Lines/Drains/Airways Status   Active Line/Drains/Airways    Name:   Placement date:   Placement time:   Site:   Days:   Peripheral IV 02/17/19 Right Arm   02/17/19    0142    Arm   less than 1   Incision 09/16/12 Back Bilateral   09/16/12    1333     2345  Intake/Output Last 24 hours No intake or output data in the 24 hours ending 02/17/19 0648  Labs/Imaging Results for orders placed or performed during the hospital encounter of 02/17/19 (from the past 48 hour(s))  CBC with Differential     Status: Abnormal   Collection Time: 02/17/19  1:38 AM  Result Value Ref Range   WBC 10.7 (H) 4.0 - 10.5 K/uL   RBC 4.34 4.22 - 5.81 MIL/uL   Hemoglobin 14.0 13.0 - 17.0 g/dL   HCT 17.7 93.9 - 03.0 %   MCV 96.3 80.0 - 100.0 fL   MCH 32.3 26.0 - 34.0 pg   MCHC 33.5 30.0 - 36.0 g/dL   RDW 09.2 33.0 - 07.6 %   Platelets 172 150 - 400 K/uL   nRBC 0.0 0.0 - 0.2 %   Neutrophils Relative % 81 %   Neutro Abs 8.8 (H) 1.7 - 7.7 K/uL   Lymphocytes  Relative 9 %   Lymphs Abs 1.0 0.7 - 4.0 K/uL   Monocytes Relative 7 %   Monocytes Absolute 0.7 0.1 - 1.0 K/uL   Eosinophils Relative 2 %   Eosinophils Absolute 0.2 0.0 - 0.5 K/uL   Basophils Relative 0 %   Basophils Absolute 0.0 0.0 - 0.1 K/uL   Immature Granulocytes 1 %   Abs Immature Granulocytes 0.06 0.00 - 0.07 K/uL    Comment: Performed at [facility], [address] [place], [place] [address]  Urinalysis, Complete w Microscopic     Status: Abnormal   Collection Time: 02/17/19  1:38 AM  Result Value Ref Range   Color, Urine YELLOW (A) YELLOW   APPearance CLEAR (A) CLEAR   Specific Gravity, Urine 1.015 1.005 - 1.030   pH 5.0 5.0 - 8.0   Glucose, UA NEGATIVE NEGATIVE mg/dL   Hgb urine dipstick NEGATIVE NEGATIVE   Bilirubin Urine NEGATIVE NEGATIVE   Ketones, ur NEGATIVE NEGATIVE mg/dL   Protein, ur NEGATIVE NEGATIVE mg/dL   Nitrite NEGATIVE NEGATIVE   Leukocytes,Ua NEGATIVE NEGATIVE   RBC / HPF 0-5 0 - 5 RBC/hpf   WBC, UA 0-5 0 - 5 WBC/hpf   Bacteria, UA NONE SEEN NONE SEEN   Squamous Epithelial / LPF NONE SEEN 0 - 5   Mucus PRESENT    Hyaline Casts, UA PRESENT     Comment: Performed at [facility], [address] [place], [place] [address]  SARS Coronavirus 2 (CEPHEID - Performed in [facility] hospital lab), Hosp Order     Status: None   Collection Time: 02/17/19  1:38 AM  Result Value Ref Range   SARS Coronavirus 2 NEGATIVE NEGATIVE    Comment: (NOTE) If result is NEGATIVE SARS-CoV-2 target nucleic acids are NOT DETECTED. The SARS-CoV-2 RNA is generally detectable in upper and lower  respiratory specimens during the acute phase of infection. The lowest  concentration of SARS-CoV-2 viral copies this assay can detect is 250  copies / mL. A negative result does not preclude SARS-CoV-2 infection  and should not be used as the sole basis for treatment or other  patient management decisions.  A negative result may occur with  improper specimen  collection / handling, submission of specimen other  than nasopharyngeal swab, presence of viral mutation(s) within the  areas targeted by this assay, and inadequate number of viral copies  (<250 copies / mL). A negative result must be combined with clinical  observations, patient history, and epidemiological information. If result is POSITIVE SARS-CoV-2 target nucleic acids are  DETECTED. The SARS-CoV-2 RNA is generally detectable in upper and lower  respiratory specimens dur ing the acute phase of infection.  Positive  results are indicative of active infection with SARS-CoV-2.  Clinical  correlation with patient history and other diagnostic information is  necessary to determine patient infection status.  Positive results do  not rule out bacterial infection or co-infection with other viruses. If result is PRESUMPTIVE POSTIVE SARS-CoV-2 nucleic acids MAY BE PRESENT.   A presumptive positive result was obtained on the submitted specimen  and confirmed on repeat testing.  While 2019 novel coronavirus  (SARS-CoV-2) nucleic acids may be present in the submitted sample  additional confirmatory testing may be necessary for epidemiological  and / or clinical management purposes  to differentiate between  SARS-CoV-2 and other Sarbecovirus currently known to infect humans.  If clinically indicated additional testing with an alternate test  methodology [phone] is advised. The SARS-CoV-2 RNA is generally  detectable in upper and lower respiratory sp ecimens during the acute  phase of infection. The expected result is Negative. Fact Sheet for Patients:  BoilerBrush.com.cyhttps://www.fda.gov/media/136312/download Fact Sheet for Healthcare Providers: https://pope.com/https://www.fda.gov/media/136313/download This test is not yet approved or cleared by the [place] FDA and has been authorized for detection and/or diagnosis of SARS-CoV-2 by FDA under an Emergency Use Authorization (EUA).  This EUA will remain in effect  (meaning this test can be used) for the duration of the COVID-19 declaration under Section 564(b)(1) of the Act, 21 U.S.C. section 360bbb-3(b)(1), unless the authorization is terminated or revoked sooner. Performed at [facility], [address] [place], [place] [address]   Basic metabolic panel     Status: Abnormal   Collection Time: 02/17/19  2:15 AM  Result Value Ref Range   Sodium 136 135 - 145 mmol/L   Potassium 4.4 3.5 - 5.1 mmol/L   Chloride 100 98 - 111 mmol/L   CO2 29 22 - 32 mmol/L   Glucose, Bld 125 (H) 70 - 99 mg/dL   BUN 16 8 - 23 mg/dL   Creatinine, Ser 8.110.93 0.61 - 1.24 mg/dL   Calcium 9.5 8.9 - 91.410.3 mg/dL   GFR calc non Af Amer >60 >60 mL/min   GFR calc Af Amer >60 >60 mL/min   Anion gap 7 5 - 15    Comment: Performed at [facility], [address] [place], [place] [address]  Troponin I -     Status: None   Collection Time: 02/17/19  2:15 AM  Result Value Ref Range   Troponin I <0.03 <0.03 ng/mL    Comment: Performed at [facility], [address] [place], [place] [address]  Carbamazepine level, total     Status: None   Collection Time: 02/17/19  4:10 AM  Result Value Ref Range   Carbamazepine Lvl 10.2 4.0 - 12.0 ug/mL    Comment: Performed at [facility], [address] [place], [place] [address]   Ct Head Wo Contrast  Result Date: 02/17/2019 CLINICAL DATA:  78 year old male with history of tremors. Fall. Prior history of stroke. EXAM: CT HEAD WITHOUT CONTRAST TECHNIQUE: Contiguous axial images were obtained from the base of the skull through the vertex without intravenous contrast. COMPARISON:  MRI of the brain 11/23/2015.  Head CT 04/28/2010. FINDINGS: Brain: Mild cerebral atrophy. Patchy and confluent areas of decreased attenuation are noted throughout the deep and periventricular white matter of the cerebral hemispheres bilaterally, compatible with chronic microvascular ischemic disease. Well-defined area of low  attenuation  in the left cerebellar hemisphere (axial image 10 of series 2), compatible with an old infarct. No evidence of acute infarction, hemorrhage, hydrocephalus, extra-axial collection or mass lesion/mass effect. Vascular: No hyperdense vessel or unexpected calcification. Skull: Normal. Negative for fracture or focal lesion. Sinuses/Orbits: No acute finding. Other: None. IMPRESSION: 1. No acute intracranial abnormalities. 2. Mild cerebral atrophy with chronic microvascular ischemic changes in the cerebral white matter bilaterally and old left cerebellar infarct, as above. Electronically Signed   By: Trudie Reed M.D.   On: 02/17/2019 05:54   Dg Chest Port 1 View  Result Date: 02/17/2019 CLINICAL DATA:  Pain status post fall EXAM: PORTABLE CHEST 1 VIEW COMPARISON:  Chest x-ray dated 09/12/2012. FINDINGS: The cardiac size is increased. There is no pneumothorax. No large pleural effusion. No acute osseous abnormality. IMPRESSION: No active disease. Electronically Signed   By: Katherine Mantle M.D.   On: 02/17/2019 01:20    Pending Labs [employer] (From admission, onward)    Start     Ordered   Signed and Armed forces training and education officer morning,   R     Signed and Held   Signed and Held  CBC  Tomorrow morning,   R     Signed and Held   Signed and Held  TSH  Add-on,   R     Signed and Held   Signed and Held  Troponin I - Now Then Q6H  Now then every 6 hours,   R     Signed and Held          Vitals/Pain Today's Vitals   02/17/19 0400 02/17/19 0430 02/17/19 0500 02/17/19 0600  BP: (!) 154/91 (!) 150/95  114/89  Pulse: 83 79 63 60  Resp:      Temp:      TempSrc:      SpO2: 96% 93% 94% 98%  Weight:      Height:      PainSc:    Asleep    Isolation Precautions No active isolations  Medications Medications  ketorolac (TORADOL) 30 MG/ML injection 9.9 mg (9.9 mg Intravenous Given 02/17/19 0330)  aspirin chewable tablet 324 mg (324 mg Oral Given 02/17/19 4098)     Mobility walks with person assist Moderate fall risk   Focused Assessments Cardiac Assessment Handoff:  Cardiac Rhythm: Atrial fibrillation(controlled rate; pt denies history of Afib) Lab Results  Component Value Date   TROPONINI <0.03 02/17/2019   No results found for: DDIMER Does the Patient currently have chest pain? No     R Recommendations: See Admitting Provider Note  Report given to:   Additional Notes:

## 2019-02-17 NOTE — Progress Notes (Signed)
OT Cancellation Note  Patient Details Name: [name] MRN: [id number] DOB: 05-Mar-1941   Cancelled Treatment:    Reason Eval/Treat Not Completed: Other (comment)(Thank you for the OT consult. Order recieved and chart reviewed. Pt noted to have pending cardiology consult. Will hold occupational therapy evaluation till later date/time until cleared by cardiology. )   Rockney Ghee, M.S., OTR/L Ascom: [phone] 02/17/19, 9:59 AM

## 2019-02-17 NOTE — ED Notes (Signed)
Report to lorrie, rn.  

## 2019-02-18 ENCOUNTER — Telehealth: Payer: Self-pay

## 2019-02-18 DIAGNOSIS — I4891 Unspecified atrial fibrillation: Secondary | ICD-10-CM | POA: Diagnosis not present

## 2019-02-18 DIAGNOSIS — R002 Palpitations: Secondary | ICD-10-CM

## 2019-02-18 DIAGNOSIS — I272 Pulmonary hypertension, unspecified: Secondary | ICD-10-CM

## 2019-02-18 DIAGNOSIS — I10 Essential (primary) hypertension: Secondary | ICD-10-CM

## 2019-02-18 LAB — CBC
HCT: 41.1 % (ref 39.0–52.0)
Hemoglobin: 13.6 g/dL (ref 13.0–17.0)
MCH: 32.4 pg (ref 26.0–34.0)
MCHC: 33.1 g/dL (ref 30.0–36.0)
MCV: 97.9 fL (ref 80.0–100.0)
Platelets: 192 10*3/uL (ref 150–400)
RBC: 4.2 MIL/uL — ABNORMAL LOW (ref 4.22–5.81)
RDW: 12.6 % (ref 11.5–15.5)
WBC: 9.9 10*3/uL (ref 4.0–10.5)
nRBC: 0 % (ref 0.0–0.2)

## 2019-02-18 LAB — BASIC METABOLIC PANEL
Anion gap: 8 (ref 5–15)
BUN: 21 mg/dL (ref 8–23)
CO2: 27 mmol/L (ref 22–32)
Calcium: 9.3 mg/dL (ref 8.9–10.3)
Chloride: 99 mmol/L (ref 98–111)
Creatinine, Ser: 0.84 mg/dL (ref 0.61–1.24)
GFR calc Af Amer: >60 mL/min (ref >60)
GFR calc non Af Amer: >60 mL/min (ref >60)
Glucose, Bld: 117 mg/dL — ABNORMAL HIGH (ref 70–99)
Potassium: 4.4 mmol/L (ref 3.5–5.1)
Sodium: 134 mmol/L — ABNORMAL LOW (ref 135–145)

## 2019-02-18 LAB — MAGNESIUM: Magnesium: 2.2 mg/dL (ref 1.7–2.4)

## 2019-02-18 MED ORDER — SODIUM CHLORIDE 0.9% FLUSH
3.0000 mL | Freq: Two times a day (BID) | INTRAVENOUS | Status: DC
  Administered 2019-02-18: 3 mL via INTRAVENOUS

## 2019-02-18 MED ORDER — MAGNESIUM HYDROXIDE 400 MG/5ML PO SUSP
30.0000 mL | Freq: Every day | ORAL | Status: DC | PRN
  Administered 2019-02-18: 30 mL via ORAL
  Filled 2019-02-18: qty 30

## 2019-02-18 MED ORDER — ALUM & MAG HYDROXIDE-SIMETH 200-200-20 MG/5ML PO SUSP
30.0000 mL | ORAL | Status: DC | PRN
  Filled 2019-02-18: qty 30

## 2019-02-18 NOTE — Evaluation (Signed)
Occupational Therapy Evaluation Patient Details Name: [name] MRN: [id number] DOB: [birth year] Today's Date: 02/18/2019    History of Present Illness [name]  is a 78 y.o. male with a known history of hypertension, hyperlipidemia, history of subarachnoid hemorrhage, history of stroke with no residual deficits, seizures who presented to the ED with weakness and multiple mechanical falls at home.  He has been seen ED multiple times over the last couple of weeks with falls.  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 In the ED, he was noted to be in atrial fibrillation with heart rates in the 70s to 80s.   Clinical Impression   Pt is 78 year old male who lives at home with his wife and admitted after falling at home and presented with atrial fibrillation.  He has a history of several falls in the past several weeks. Pt has a hx of stroke with decreased strength and active movement in LLE from this event.  Pt was independent in all ADLs prior to surgery and is eager to return to PLOF.  Pt currently requires min assist for LB dressing while in seated position due to pain and decreased AROM of LLE.  Since he has a history of falling, pt was educated in use of reacher and sock aid to prevent falls since he was leaning forward to the floor to don/doff socks, underwear and pants with inability to cross one leg over the other. Pt has a reacher at home and his wife used a sock aid in the past and feels like he is well educated on how to use this after practicing during this session and is not in need of any further OT services.  Also reviewed energy conservation tech and pacing. No further OT services rec and pt seen for OT evaluation only.      Follow Up Recommendations  No OT follow up    Equipment Recommendations  Other (comment)(reacher and sock aid and elastic shoe  laces rec to increase safety with ADLs)    Recommendations for Other Services       Precautions / Restrictions Precautions Precautions: Fall Restrictions Weight Bearing Restrictions: No      Mobility Bed Mobility                  Transfers                      Balance                                           ADL either performed or assessed with clinical judgement   ADL Overall ADL's : Modified independent                                       General ADL Comments: Pt is currently able to ambulate with RW to and from bathroom, complete toileting and hygiene independently and completes feeding, UB dressing independently which is baseline.  He required min assist for LB dressing and educated in use of reacher and sock aid to increase safety with completing instead of leaning forward to the floor.  Pt indicated he has  a reacher and needs to purchase a sock aid now that he knows how to use it properly.     Vision Baseline Vision/History: Wears glasses Wears Glasses: At all times Patient Visual Report: No change from baseline       Perception     Praxis      Pertinent Vitals/Pain Pain Assessment: No/denies pain(stated that he does have pain in L lower back and hip when sitting in chair for 1+ hours.)     Hand Dominance Right   Extremity/Trunk Assessment Upper Extremity Assessment Upper Extremity Assessment: Overall WFL for tasks assessed   Lower Extremity Assessment Lower Extremity Assessment: Defer to PT evaluation       Communication Communication Communication: No difficulties   Cognition Arousal/Alertness: Awake/alert Behavior During Therapy: WFL for tasks assessed/performed Overall Cognitive Status: Within Functional Limits for tasks assessed                                     General Comments       Exercises     Shoulder Instructions      Home Living Family/patient expects to  be discharged to:: Private residence Living Arrangements: Spouse/significant other Available Help at Discharge: Family;Available 24 hours/day Type of Home: House Home Access: Stairs to enter [employer] of Steps: 2 Entrance Stairs-Rails: Left Home Layout: One level     Bathroom Shower/Tub: Chief Strategy Officer: Handicapped height     Home Equipment: Environmental consultant - 2 wheels;Bedside commode;Grab bars - tub/shower;Grab bars - toilet;Cane - single point   Additional Comments: Pt is planning to remodel his bathroom to have a walk in shower instead of a tub with shower.  He has grab bars by toilet and in shower currently.      Prior Functioning/Environment Level of Independence: Independent with assistive device(s)        Comments: Pt normally uses SPC for community ambulation, does not use AD in home. He has had several falls prior to this recent one and reported difficulty with balance and ambulation, has needed assistance due to L leg pain.        OT Problem List: Decreased strength;Impaired balance (sitting and/or standing);Decreased knowledge of use of DME or AE      OT Treatment/Interventions:      OT Goals(Current goals can be found in the care plan section) Acute Rehab OT Goals Patient Stated Goal: to go home today  OT Goal Formulation: With patient Time For Goal Achievement: 02/18/19 Potential to Achieve Goals: Good  OT Frequency:     Barriers to D/C:            Co-evaluation              AM-PAC OT "6 Clicks" Daily Activity     Outcome Measure Help from another person eating meals?: None Help from another person taking care of personal grooming?: None Help from another person toileting, which includes using toliet, bedpan, or urinal?: None Help from another person bathing (including washing, rinsing, drying)?: A Little Help from another person to put on and taking off regular upper body clothing?: None Help from another person to put  on and taking off regular lower body clothing?: A Little 6 Click Score: 22   End of Session Equipment Utilized During Treatment: Gait belt  Activity Tolerance: Patient tolerated treatment well Patient left: in bed;with call bell/phone within reach;with bed alarm set  OT Visit Diagnosis: Repeated falls (R29.6);History of falling (Z91.81)                Time: 5997-7414 OT Time Calculation (min): 27 min Charges:  OT General Charges $OT Visit: 1 Visit OT Evaluation $OT Eval Low Complexity: 1 Low OT Treatments $Self Care/Home Management : 8-22 mins  Susanne Borders, OTR/L ascom [phone] 02/18/19, 3:33 PM

## 2019-02-18 NOTE — Telephone Encounter (Signed)
-----   Message from Sondra Barges, PA-C sent at 02/18/2019 12:55 PM EDT ----- Regarding: Zio Can you please get a real time Zio mailed out to him? Dx: Afib/bradycardia

## 2019-02-18 NOTE — Telephone Encounter (Signed)
Pt in hospital. Order requested by Eula Listen, PA for 14 day zio at for palpitations. Order placed and registered with zio site.   Call to pt for instructions. No answer. LMTCB.

## 2019-02-18 NOTE — TOC Transition Note (Signed)
Transition of Care [facility]) - CM/SW Discharge Note   Patient Details  Name: [name] MRN: [id number] Date of Birth: 06-10-1941  [facility] CM/SW Contact:  Sherren Kerns, RN Phone Number: 02/18/2019, 2:13 PM   Clinical Narrative:   Patient agreed to home health RN.  Does not want PT until he has seen his neurologist at [facility].  Barbara Cower Is aware of discharge today.    Final next level of care: Home w Home Health Services Barriers to Discharge: No Barriers Identified   Patient Goals and CMS Choice Patient states their goals for this hospitalization and ongoing recovery are:: Home with home helath RN CMS Medicare.gov Compare Post Acute Care list provided to:: Patient Choice offered to / list presented to : Patient  Discharge Placement                       Discharge Plan and Services                          HH Arranged: RN [facility] Agency: [facility] Date [facility] Agency Contacted: 02/18/19 Time HH Agency Contacted: 1413 Representative spoke with at [facility] Agency: Barbara Cower  Social Determinants of Health (SDOH) Interventions     Readmission Risk Interventions No flowsheet data found.

## 2019-02-18 NOTE — Evaluation (Signed)
Physical Therapy Evaluation Patient Details Name: [name] E Popovich MRN: [id number] DOB: 11-28-1940 Today's Date: 02/18/2019   History of Present Illness  [name]  is a 78 y.o. male with a known history of hypertension, hyperlipidemia, history of subarachnoid hemorrhage, history of stroke with no residual deficits, seizures who presented to the ED with weakness and multiple mechanical falls at home.  He has been seen ED multiple times over the last couple of weeks with falls.  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 In the ED, he was noted to be in atrial fibrillation with heart rates in the 70s to 80s.  Clinical Impression  Pt is a pleasant 78 year old male who was admitted for Afib. Pt performs bed mobility with mod I and transfers/ambulation with cga and RW. Vitals monitored during ambulation with HR stable. Pt demonstrates deficits with ambulation/strength. Close to baseline level. Would benefit from skilled PT to address above deficits and promote optimal return to PLOF. Recommend transition to HHPT upon discharge from acute hospitalization.       Follow Up Recommendations Home health PT    Equipment Recommendations  None recommended by PT    Recommendations for Other Services       Precautions / Restrictions Precautions Precautions: Fall Restrictions Weight Bearing Restrictions: No      Mobility  Bed Mobility Overal bed mobility: Modified Independent             General bed mobility comments: safe technique, uses railing for assist  Transfers Overall transfer level: Needs assistance Equipment used: Rolling walker (2 wheeled) Transfers: Sit to/from Stand Sit to Stand: Min guard         General transfer comment: upright posture noted, RW adjusted for height.   Ambulation/Gait Ambulation/Gait assistance: Min guard Gait Distance  (Feet): 300 Feet Assistive device: Rolling walker (2 wheeled) Gait Pattern/deviations: Step-to pattern     General Gait Details: short step to gait with L step length shorter. Decreased heel strike noted B. Slow cadence. HR at 72 pre and 69 post.  Stairs            Wheelchair Mobility    Modified Rankin (Stroke Patients Only)       Balance Overall balance assessment: History of Falls;Needs assistance Sitting-balance support: Feet supported Sitting balance-Leahy Scale: Good     Standing balance support: Bilateral upper extremity supported Standing balance-Leahy Scale: Good                               Pertinent Vitals/Pain Pain Assessment: No/denies pain    Home Living Family/patient expects to be discharged to:: Private residence Living Arrangements: Spouse/significant other Available Help at Discharge: Family;Available 24 hours/day Type of Home: House Home Access: Stairs to enter Entrance Stairs-Rails: Left Entrance Stairs-Number of Steps: 2 Home Layout: One level Home Equipment: Walker - 2 wheels;Bedside commode;Grab bars - tub/shower;Grab bars - toilet;Cane - single point Additional Comments: Pt is planning to remodel his bathroom to have a walk in shower instead of a tub with shower.  He has grab bars by toilet and in shower currently.    Prior Function Level of Independence: Independent with assistive device(s)         Comments: Pt normally uses SPC for community ambulation, does not use AD in home. He has had  several falls prior to this recent one and reported difficulty with balance and ambulation, has needed assistance due to L leg pain.     Hand Dominance   Dominant Hand: Right    Extremity/Trunk Assessment   Upper Extremity Assessment Upper Extremity Assessment: Overall WFL for tasks assessed    Lower Extremity Assessment Lower Extremity Assessment: Generalized weakness(B LE grossly 4/5)       Communication    Communication: No difficulties  Cognition Arousal/Alertness: Awake/alert Behavior During Therapy: WFL for tasks assessed/performed Overall Cognitive Status: Within Functional Limits for tasks assessed                                        General Comments      Exercises Other Exercises Other Exercises: cues for gait training including attempting reciprocal gait and monitoring vitals   Assessment/Plan    PT Assessment Patient needs continued PT services  PT Problem List Decreased strength;Decreased balance;Decreased mobility;Cardiopulmonary status limiting activity       PT Treatment Interventions Gait training;Therapeutic exercise;Balance training    PT Goals (Current goals can be found in the Care Plan section)  Acute Rehab PT Goals Patient Stated Goal: to go home today  PT Goal Formulation: With patient Time For Goal Achievement: 03/04/19 Potential to Achieve Goals: Good    Frequency Min 2X/week   Barriers to discharge        Co-evaluation               AM-PAC PT "6 Clicks" Mobility  Outcome Measure Help needed turning from your back to your side while in a flat bed without using bedrails?: None Help needed moving from lying on your back to sitting on the side of a flat bed without using bedrails?: None Help needed moving to and from a bed to a chair (including a wheelchair)?: None Help needed standing up from a chair using your arms (e.g., wheelchair or bedside chair)?: None Help needed to walk in hospital room?: A Little Help needed climbing 3-5 steps with a railing? : A Little 6 Click Score: 22    End of Session Equipment Utilized During Treatment: Gait belt Activity Tolerance: Patient tolerated treatment well Patient left: in bed;with bed alarm set Nurse Communication: Mobility status PT Visit Diagnosis: Muscle weakness (generalized) (M62.81);Difficulty in walking, not elsewhere classified (R26.2);Unsteadiness on feet (R26.81)     Time: 2094-7096 PT Time Calculation (min) (ACUTE ONLY): 30 min   Charges:   PT Evaluation $PT Eval Low Complexity: 1 Low PT Treatments $Gait Training: 8-22 mins        Elizabeth Palau, PT, DPT [phone]   Rafia Shedden 02/18/2019, 3:37 PM

## 2019-02-18 NOTE — Discharge Summary (Signed)
[facility] - [place] [facility]   PATIENT NAME: [name]    MR#:  [id number]  DATE OF BIRTH:  Apr 02, 1941  DATE OF ADMISSION:  02/17/2019 ADMITTING PHYSICIAN: Campbell Stall, MD  DATE OF DISCHARGE: 02/18/2019  PRIMARY CARE PHYSICIAN: Jerl Mina, MD    ADMISSION DIAGNOSIS:  Weakness [R53.1] New onset atrial fibrillation (HCC) [I48.91] Fall, initial encounter [W19.XXXA]  DISCHARGE DIAGNOSIS:  Active Problems:   Atrial fibrillation (HCC)   SECONDARY DIAGNOSIS:   Past Medical History:  Diagnosis Date  . Arthritis    "lower back on down; into all joints; into my feet" (2012-09-18)  . Chronic low back pain    hx of  . Complication of anesthesia    "cardiac arrest with dye from lexiscan [04/15/10 records indicate bradycardia, no ischemia, EF 56%], unstable BP with anesthesia[  . GERD (gastroesophageal reflux disease)   . 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 Herniated disc, cervical   . Hypercholesteremia   . Hypertension    sees Dr. Jerl Mina, [facility] in [place]  . Intracranial bleed [facility])    december 2010  . Kidney stones    "passed on their own" (09/18/2012)  . Migraines    "2 in my life" (09/18/12)  . Retina disorder    hx of torn retina  . Stroke (HCC) 2002   LLE "just a little bit weaker" (09-18-12)    HOSPITAL COURSE:   1.  New onset atrial fibrillation.  The patient had a subarachnoid hemorrhage while on Plavix and he does not want to go on major anticoagulation.  Continue aspirin for now.  The patient was rate controlled but he was taking propranolol and Calan.  I needed to discontinue both of these medications secondary to 2 pauses that he had overnight.  Cardiology will set up for an outpatient heart monitor. 2.  Atrial fibrillation with pauses.  Hold Callanan and propranolol at this time.  Monitor as outpatient with Holter monitor.  Cardiology to set up. 3.  Hypertension.  Continue Aliskiren,  doxazosin, irbesartan.  Hold propranolol and verapamil with pauses. 4.  Hyperlipidemia unspecified on simvastatin 5.  Recent labral tear of left hip.  Follow-up with orthopedic surgery as outpatient.  Home health PT.  Patient was able to straight leg raise bilaterally without a problem. 6.  Mechanical falls.  He states that he slipped out of bed.  He states that pain interfering with him moving around.  Home health set up. 7.  History of stroke continue aspirin 8.  History of seizures on carbamazepine  DISCHARGE CONDITIONS:   Satisfactory  CONSULTS OBTAINED:  Treatment Team:  Iran Ouch, MD  DRUG ALLERGIES:   Allergies  Allergen Reactions  . Codeine Nausea Only  . Fentanyl Other (See Comments)    Given before knee surgery, blood pressure became extremely low  . Lexiscan [Regadenoson] Other (See Comments)    Created cardiac arrest    DISCHARGE MEDICATIONS:   Allergies as of 02/18/2019      Reactions   Codeine Nausea Only   Fentanyl Other (See Comments)   Given before knee surgery, blood pressure became extremely low   Lexiscan [regadenoson] Other (See Comments)   Created cardiac arrest      Medication List    STOP taking these medications   naproxen 500 MG tablet Commonly known as:  Naprosyn   predniSONE 10 MG tablet Commonly known as:  DELTASONE   propranolol 80 MG tablet  Commonly known as:  INDERAL   verapamil 120 MG tablet Commonly known as:  CALAN     TAKE these medications   aliskiren 300 MG tablet Commonly known as:  TEKTURNA Take 300 mg by mouth daily.   aspirin 325 MG tablet Take 325 mg by mouth daily.   carbamazepine 200 MG tablet Commonly known as:  TEGRETOL Take 200 mg by mouth 2 (two) times a day.   cetirizine 10 MG tablet Commonly known as:  ZYRTEC Take 10 mg by mouth daily as needed for allergies.   doxazosin 1 MG tablet Commonly known as:  CARDURA Take 1 mg by mouth at bedtime.   famotidine 20 MG tablet Commonly known as:   PEPCID Take 1 tablet (20 mg total) by mouth daily.   Fish Oil 1200 MG Caps Take 1,200 mg by mouth daily.   furosemide 20 MG tablet Commonly known as:  LASIX Take 20 mg by mouth daily.   HYDROcodone-acetaminophen 5-325 MG tablet Commonly known as:  Norco Take 1 tablet by mouth every 4 (four) hours as needed for moderate pain.   irbesartan 300 MG tablet Commonly known as:  AVAPRO Take 300 mg by mouth daily.   pantoprazole 40 MG tablet Commonly known as:  PROTONIX Take 40 mg by mouth at bedtime.   simvastatin 20 MG tablet Commonly known as:  ZOCOR Take 20 mg by mouth 2 (two) times daily.   traMADol 50 MG tablet Commonly known as:  Ultram Take 1 tablet (50 mg total) by mouth every 6 (six) hours as needed. What changed:  when to take this   vitamin B-12 1000 MCG tablet Commonly known as:  CYANOCOBALAMIN Take 1,000 mcg by mouth daily.        DISCHARGE INSTRUCTIONS:   Follow-up PMD 5 days Follow-up cardiology 1 week  If you experience worsening of your admission symptoms, develop shortness of breath, life threatening emergency, suicidal or homicidal thoughts you must seek medical attention immediately by calling 911 or calling your MD immediately  if symptoms less severe.  You Must read complete instructions/literature along with all the possible adverse reactions/side effects for all the Medicines you take and that have been prescribed to you. Take any new Medicines after you have completely understood and accept all the possible adverse reactions/side effects.   Please note  You were cared for by a hospitalist during your hospital stay. If you have any questions about your discharge medications or the care you received while you were in the hospital after you are discharged, you can call the unit and asked to speak with the hospitalist on call if the hospitalist that took care of you is not available. Once you are discharged, your primary care physician will handle any  further medical issues. Please note that NO REFILLS for any discharge medications will be authorized once you are discharged, as it is imperative that you return to your primary care physician (or establish a relationship with a primary care physician if you do not have one) for your aftercare needs so that they can reassess your need for medications and monitor your lab values.    Today   CHIEF COMPLAINT:   Chief Complaint  Patient presents with  . Tremors    HISTORY OF PRESENT ILLNESS:  [name]  is a 78 y.o. male came in with weakness   VITAL SIGNS:  Blood pressure (!) 157/89, pulse (!) 51, temperature 97.7 F (36.5 C), temperature source Oral, resp. rate 20, height 6' (  1.829 m), weight 97.5 kg, SpO2 98 %.   PHYSICAL EXAMINATION:  GENERAL:  78 y.o.-year-old patient lying in the bed with no acute distress.  EYES: Pupils equal, round, reactive to light and accommodation. No scleral icterus. Extraocular muscles intact.  HEENT: Head atraumatic, normocephalic. Oropharynx and nasopharynx clear.  NECK:  Supple, no jugular venous distention. No thyroid enlargement, no tenderness.  LUNGS: Normal breath sounds bilaterally, no wheezing, rales,rhonchi or crepitation. No use of accessory muscles of respiration.  CARDIOVASCULAR: S1, S2 irregular regular bradycardic no murmurs, rubs, or gallops.  ABDOMEN: Soft, non-tender, non-distended. Bowel sounds present. No organomegaly or mass.  EXTREMITIES: No pedal edema, cyanosis, or clubbing.  NEUROLOGIC: Cranial nerves II through XII are intact. Muscle strength 5/5 in all extremities. Sensation intact. Gait not checked.  Able to straight leg raise bilaterally PSYCHIATRIC: The patient is alert and oriented x 3.  SKIN: No obvious rash, lesion, or ulcer.  Small bruise under left knee  DATA REVIEW:   CBC Recent Labs  Lab 02/18/19 0343  WBC 9.9  HGB 13.6  HCT 41.1  PLT 192    Chemistries  Recent Labs  Lab 02/18/19 0343  NA 134*  K  4.4  CL 99  CO2 27  GLUCOSE 117*  BUN 21  CREATININE 0.84  CALCIUM 9.3  MG 2.2    Cardiac Enzymes Recent Labs  Lab 02/17/19 2116  TROPONINI <0.03    Microbiology Results  Results for orders placed or performed during the hospital encounter of 02/17/19  SARS Coronavirus 2 (CEPHEID - Performed in [facility] hospital lab), Hosp Order     Status: None   Collection Time: 02/17/19  1:38 AM  Result Value Ref Range Status   SARS Coronavirus 2 NEGATIVE NEGATIVE Final    Comment: (NOTE) If result is NEGATIVE SARS-CoV-2 target nucleic acids are NOT DETECTED. The SARS-CoV-2 RNA is generally detectable in upper and lower  respiratory specimens during the acute phase of infection. The lowest  concentration of SARS-CoV-2 viral copies this assay can detect is 250  copies / mL. A negative result does not preclude SARS-CoV-2 infection  and should not be used as the sole basis for treatment or other  patient management decisions.  A negative result may occur with  improper specimen collection / handling, submission of specimen other  than nasopharyngeal swab, presence of viral mutation(s) within the  areas targeted by this assay, and inadequate number of viral copies  (<250 copies / mL). A negative result must be combined with clinical  observations, patient history, and epidemiological information. If result is POSITIVE SARS-CoV-2 target nucleic acids are DETECTED. The SARS-CoV-2 RNA is generally detectable in upper and lower  respiratory specimens dur ing the acute phase of infection.  Positive  results are indicative of active infection with SARS-CoV-2.  Clinical  correlation with patient history and other diagnostic information is  necessary to determine patient infection status.  Positive results do  not rule out bacterial infection or co-infection with other viruses. If result is PRESUMPTIVE POSTIVE SARS-CoV-2 nucleic acids MAY BE PRESENT.   A presumptive positive result was  obtained on the submitted specimen  and confirmed on repeat testing.  While 2019 novel coronavirus  (SARS-CoV-2) nucleic acids may be present in the submitted sample  additional confirmatory testing may be necessary for epidemiological  and / or clinical management purposes  to differentiate between  SARS-CoV-2 and other Sarbecovirus currently known to infect humans.  If clinically indicated additional testing with an alternate test  methodology [phone] is advised. The SARS-CoV-2 RNA is generally  detectable in upper and lower respiratory sp ecimens during the acute  phase of infection. The expected result is Negative. Fact Sheet for Patients:  BoilerBrush.com.cyhttps://www.fda.gov/media/136312/download Fact Sheet for Healthcare Providers: https://pope.com/https://www.fda.gov/media/136313/download This test is not yet approved or cleared by the [place] FDA and has been authorized for detection and/or diagnosis of SARS-CoV-2 by FDA under an Emergency Use Authorization (EUA).  This EUA will remain in effect (meaning this test can be used) for the duration of the COVID-19 declaration under Section 564(b)(1) of the Act, 21 U.S.C. section 360bbb-3(b)(1), unless the authorization is terminated or revoked sooner. Performed at [facility], [address] [place], [place] [address]     RADIOLOGY:  Ct Head Wo Contrast  Result Date: 02/17/2019 CLINICAL DATA:  78 year old male with history of tremors. Fall. Prior history of stroke. EXAM: CT HEAD WITHOUT CONTRAST TECHNIQUE: Contiguous axial images were obtained from the base of the skull through the vertex without intravenous contrast. COMPARISON:  MRI of the brain 11/23/2015.  Head CT 04/28/2010. FINDINGS: Brain: Mild cerebral atrophy. Patchy and confluent areas of decreased attenuation are noted throughout the deep and periventricular white matter of the cerebral hemispheres bilaterally, compatible with chronic microvascular ischemic disease. Well-defined area  of low attenuation in the left cerebellar hemisphere (axial image 10 of series 2), compatible with an old infarct. No evidence of acute infarction, hemorrhage, hydrocephalus, extra-axial collection or mass lesion/mass effect. Vascular: No hyperdense vessel or unexpected calcification. Skull: Normal. Negative for fracture or focal lesion. Sinuses/Orbits: No acute finding. Other: None. IMPRESSION: 1. No acute intracranial abnormalities. 2. Mild cerebral atrophy with chronic microvascular ischemic changes in the cerebral white matter bilaterally and old left cerebellar infarct, as above. Electronically Signed   By: Trudie Reedaniel  Entrikin M.D.   On: 02/17/2019 05:54   Dg Chest Port 1 View  Result Date: 02/17/2019 CLINICAL DATA:  Pain status post fall EXAM: PORTABLE CHEST 1 VIEW COMPARISON:  Chest x-ray dated 09/12/2012. FINDINGS: The cardiac size is increased. There is no pneumothorax. No large pleural effusion. No acute osseous abnormality. IMPRESSION: No active disease. Electronically Signed   By: Katherine Mantlehristopher  Green M.D.   On: 02/17/2019 01:20      Management plans discussed with the patient, family and they are in agreement.  CODE STATUS:     Code Status Orders  (From admission, onward)         Start     Ordered   02/17/19 0845  Full code  Continuous     02/17/19 0844        Code Status History    This patient has a current code status but no historical code status.    Advance Directive Documentation     Most Recent Value  Type of Advance Directive  Living will  Pre-existing out of facility DNR order (yellow form or pink MOST form)  -  "MOST" Form in Place?  -      TOTAL TIME TAKING CARE OF THIS PATIENT: 35 minutes.    Alford Highlandichard Zeya Balles M.D on 02/18/2019 at 3:48 PM  Between 7am to 6pm - Pager - [phone]  After 6pm go to [email or web address] - password [email or web address]  [facility] Office  [phone]  CC: Primary care physician; Jerl MinaHedrick, James, MD

## 2019-02-18 NOTE — Care Management Obs Status (Signed)
MEDICARE OBSERVATION STATUS NOTIFICATION   Patient Details  Name: [name] MRN: [id number] Date of Birth: 02-02-1941   Medicare Observation Status Notification Given:  Yes    Sherren Kerns, RN 02/18/2019, 3:15 PM

## 2019-02-18 NOTE — Progress Notes (Signed)
Progress Note  Patient Name: [name] Date of Encounter: 02/18/2019  Primary Cardiologist: [facility] - consult by End  Subjective   No chest pain, SOB, palpitations, dizziness, presyncope, or syncope.   Inpatient Medications    Scheduled Meds: . aliskiren  300 mg Oral Daily  . aspirin  325 mg Oral Daily  . carbamazepine  200 mg Oral BID  . doxazosin  1 mg Oral QHS  . enoxaparin (LOVENOX) injection  40 mg Subcutaneous Q24H  . famotidine  20 mg Oral Daily  . furosemide  20 mg Oral Daily  . irbesartan  300 mg Oral Daily  . loratadine  10 mg Oral Daily  . pantoprazole  40 mg Oral QHS  . simvastatin  20 mg Oral BID  . vitamin B-12  1,000 mcg Oral Daily   Continuous Infusions:  PRN Meds: acetaminophen, alum & mag hydroxide-simeth, hydrALAZINE, HYDROcodone-acetaminophen, magnesium hydroxide, ondansetron (ZOFRAN) IV   Vital Signs    Vitals:   02/17/19 0937 02/17/19 1954 02/17/19 2159 02/18/19 0414  BP: (!) 198/123 (!) 144/82 (!) 186/77 (!) 154/88  Pulse: 85 67 70 (!) 54  Resp:    20  Temp: 97.8 F (36.6 C) 99.1 F (37.3 C)  98.4 F (36.9 C)  TempSrc: Oral Oral  Oral  SpO2: 98% 96% 95% 91%  Weight:      Height:        Intake/Output Summary (Last 24 hours) at 02/18/2019 0723 Last data filed at 02/18/2019 0300 Gross per 24 hour  Intake 240 ml  Output 400 ml  Net -160 ml   Filed Weights   02/17/19 0044  Weight: 97.5 kg    Telemetry    Afib with ventricular rates in the upper 50s to 70s bpm, longest R-R interval 2.7 seconds - Personally Reviewed  ECG    Afib, 67 bpm, LAFB, poor R wave progression - Personally Reviewed  Physical Exam   GEN: No acute distress.   Neck: No JVD. Cardiac: Irregularly irregular, II/VI systolic murmur at the apex, no rubs, or gallops.  Respiratory: Clear to auscultation bilaterally.  GI: Soft, nontender, non-distended.   MS: No edema; No deformity. Neuro:  Alert and oriented x 3; Nonfocal.  Psych: Normal affect.   Labs    Chemistry Recent Labs  Lab 02/17/19 0215 02/18/19 0343  NA 136 134*  K 4.4 4.4  CL 100 99  CO2 29 27  GLUCOSE 125* 117*  BUN 16 21  CREATININE 0.93 0.84  CALCIUM 9.5 9.3  GFRNONAA >60 >60  GFRAA >60 >60  ANIONGAP 7 8     Hematology Recent Labs  Lab 02/17/19 0138 02/18/19 0343  WBC 10.7* 9.9  RBC 4.34 4.20*  HGB 14.0 13.6  HCT 41.8 41.1  MCV 96.3 97.9  MCH 32.3 32.4  MCHC 33.5 33.1  RDW 12.6 12.6  PLT 172 192    Cardiac Enzymes Recent Labs  Lab 02/17/19 0215 02/17/19 0906 02/17/19 1454 02/17/19 2116  TROPONINI <0.03 <0.03 0.07* <0.03   No results for input(s): TROPIPOC in the last 168 hours.   BNPNo results for input(s): BNP, PROBNP in the last 168 hours.   DDimer No results for input(s): DDIMER in the last 168 hours.   Radiology    Ct Head Wo Contrast  Result Date: 02/17/2019 IMPRESSION: 1. No acute intracranial abnormalities. 2. Mild cerebral atrophy with chronic microvascular ischemic changes in the cerebral white matter bilaterally and old left cerebellar infarct, as above. Electronically Signed  By: Trudie Reed M.D.   On: 02/17/2019 05:54   Dg Chest Port 1 View  Result Date: 02/17/2019 IMPRESSION: No active disease. Electronically Signed   By: Katherine Mantle M.D.   On: 02/17/2019 01:20    Cardiac Studies   2D Echo 02/17/2019: 1. The left ventricle has normal systolic function, with an ejection fraction of 55-60%. The cavity size was normal. There is mild concentric left ventricular hypertrophy. Left ventricular diastolic Doppler parameters are indeterminate.  2. The right ventricle has normal systolic function. The cavity was normal. There is no increase in right ventricular wall thickness. Right ventricular systolic pressure is moderately elevated with an estimated pressure of 59.5 mmHg.  3. Left atrial size was mildly dilated.  4. The mitral valve is grossly normal. Mitral valve regurgitation is mild to moderate by color  flow Doppler.  5. The aortic valve is grossly normal. Mild thickening of the aortic valve. Mild calcification of the aortic valve. Aortic valve regurgitation is mild by color flow Doppler.  Patient Profile     78 y.o. male with history of stroke, seizure disorder, intracranial bleed in 08/2009, HTN, HLD, multiple falls,migraine disorder, renal stones, chronic low back pain, and GERD admitted with new onset Afib.   Assessment & Plan    1. Newly diagnosed Afib: -Ventricular rates well controlled to mildly bradycardic -Unknown onset -Previously on verapamil and propranolol, though this have been held secondary to bradycardic rates in the upper 50s bpm -Ambulate this morning to assess for adequate rate control off rate limiting medications -CHADS2VASc at least 5 (HTN, age x 2, stroke x 2) -He has a history of subarachnoid hemorrhage in 08/2009 and has been told to not go on any anticoagulation in the future, though he has tolerated full dose ASA without issue -He also has a history of recurrent falls complicating issues leading to multiple injuries  -Given his above comorbid conditions, it has been felt the risks of OAC outweigh the benefit, though this will need to be readdressed in the future potentially  -Consider neurosurgery input regarding opinion of OAC with his history of SAH -No plans for DCCV at this time -Magnesium and potassium at goal -Thyroid function normal -Recommend outpatient sleep study given pauses/bradycardia noted while sleeping and elevated PA pressure  2. Multiple falls: -Per orthopedics and IM -Less likely related to R-R interval at this time, continue to let BB/CCB wash out  3. HTN: -BP is mildly elevated  -Continue current therapy   4. Elevated troponin: -Third troponin drawn noted to be mildly elevated at 0.07 with subsequent value being negative -Uncertain significance  -Echo as above -No plans for inpatient ischemic evaluation   5. PAH: -Continue Lasix  -Needs sleep study  -Follow up with pulmonology as an outpatient   For questions or updates, please contact [facility] Please consult [email or web address] for contact info under Cardiology/STEMI.    Signed, Eula Listen, PA-C [facility] Pager: [phone] 02/18/2019, 7:23 AM

## 2019-02-18 NOTE — Progress Notes (Signed)
Np notified of pauses (2.14sec and 3.04sec) on telemetry. Orders for a stat EKG and magnesium level. Will continue to monitor. Pt is asymptomatic.

## 2019-02-20 ENCOUNTER — Telehealth: Payer: Self-pay | Admitting: *Deleted

## 2019-02-20 NOTE — Telephone Encounter (Signed)

## 2019-02-21 NOTE — Progress Notes (Signed)
Cardiology Office Note Date:  02/25/2019  Patient ID:  [name], [name] Apr 02, 1941, MRN [id number] PCP:  Jerl Mina, MD  Cardiologist:  Dr. Kirke Corin, MD    Chief Complaint: Hospital follow up  History of Present Illness: [name] is a 78 y.o. male with history of subarachnoid hemorrhage, previous stroke in 08/2012, seizure disorder, HTN, HLD, PAH, multiple falls and torn retina who presents for hospital follow up after recent admission to [facility] from 5/25-5/26 for recurrent falls and was noted to be in new onset Afib.   PMH indicates prior significant bradycardia with Lexiscan with details being unclear.   The patient has been seen in the ED multiple times since 01/2019 with increased weakness and falls. Due to continued pain he was subsequently found to have a large tear of the left superior labrum. MRI of the spine showed a large L2-L3 disc extrusion. He has been referred to orthopedics and neurosurgery. He was most recently admitted 5/25 with recurrent fall and weakness. He was noted to be in Afib with controlled ventricular response. TSH normal, troponin peaked at 0.07, magnesium 2.2, potassium 4.4, SCr 0.93, HGB 14.0, COVID-19 negative, head CT not acute with old left cerebellar infarct, CXR not acute, echo showed an EF of 55-60%, mild concentric LVH, RVSF normal, PASP 59.5 mmHg, mildly dilated left atrium, mild to moderate MR, mild AI. He was noted to be bradycardic on telemetry with the longest R-R interval being 2.7 seconds at 4:30 AM leading to the holding of rate controlling medications. He was not placed on OAC given prior SAH. He was discharged with a Zio monitor to evaluate for significant pauses.   He comes in doing well from a cardiac perspective today. He denies any chest pain, SOB, palpitations, dizziness, presyncope, or syncope. No lower extremity swelling, orthopnea, abdominal distension, PND, or early satiety. No falls since his hospital discharge. He states he is  uncertain where it was reported he had dizziness came from. He never felt dizzy. No BRBPR or melena. Blood pressure typically runs between 125-135 systolic at home. He reports a history of white coat HTN and his BP in our office today is actually good for being in a medical office, for him. He continues to deal with significant left hip and leg pain which is causing loss of sleep. Otherwise, he does not have any issues or concerns today.    Past Medical History:  Diagnosis Date  . Arthritis    "lower back on down; into all joints; into my feet" (2012/10/15)  . Chronic low back pain    hx of  . Complication of anesthesia    "cardiac arrest with dye from lexiscan [04/15/10 records indicate bradycardia, no ischemia, EF 56%], unstable BP with anesthesia[  . GERD (gastroesophageal reflux disease)   . 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 Herniated disc, cervical   . Hypercholesteremia   . Hypertension    sees Dr. Jerl Mina, [facility] in [place]  . Intracranial bleed [facility])    december 2010  . Kidney stones    "passed on their own" (10/15/2012)  . Migraines    "2 in my life" (2012/10/15)  . PAH (pulmonary artery hypertension) (HCC)   . Persistent atrial fibrillation   . Retina disorder    hx of torn retina  . Stroke (HCC) 2002   LLE "just a little bit weaker" (10/15/2012)    Past Surgical History:  Procedure Laterality Date  . BLEPHAROPLASTY  1990's   "right eye" (09/16/2012)  . CARDIOVASCULAR STRESS TEST     nl perfusion w/o ischemia, EF 56%, bradycardia without syncope 04/15/10 (Dr. Harold Hedge)  . CATARACT EXTRACTION W/ INTRAOCULAR LENS IMPLANT  1980's   "right" (09/17/2012)  . COLONOSCOPY     06/26/09  . EYE MUSCLE SURGERY  1980's   "right eye" (09/17/2012)  . INGUINAL HERNIA REPAIR  04/10/2011   "right" (09/17/2012)  . JOINT REPLACEMENT    . POSTERIOR LUMBAR FUSION  09/16/2012   "L4-5" (09/17/2012)  . RETINAL DETACHMENT SURGERY  1980's   "right"  (09/17/2012)  . TOTAL KNEE ARTHROPLASTY  04/28/2010   "left" (09/17/2012)    Current Meds  Medication Sig  . aliskiren (TEKTURNA) 300 MG tablet Take 300 mg by mouth daily.   Marland Kitchen amoxicillin (AMOXIL) 500 MG tablet Take 500 mg by mouth as directed. Dental procedure only  . aspirin 325 MG tablet Take 325 mg by mouth daily.  . carbamazepine (TEGRETOL) 200 MG tablet Take 200 mg by mouth 2 (two) times a day.   . cetirizine (ZYRTEC) 10 MG tablet Take 10 mg by mouth daily as needed for allergies.   Marland Kitchen doxazosin (CARDURA) 1 MG tablet Take 1 mg by mouth at bedtime.   . furosemide (LASIX) 20 MG tablet Take 20 mg by mouth daily.   . irbesartan (AVAPRO) 300 MG tablet Take 300 mg by mouth daily.  . Omega-3 Fatty Acids (FISH OIL) 1200 MG CAPS Take 1,200 mg by mouth daily.  . pantoprazole (PROTONIX) 40 MG tablet Take 40 mg by mouth at bedtime.   . simvastatin (ZOCOR) 20 MG tablet Take 20 mg by mouth 2 (two) times daily.   . vitamin B-12 (CYANOCOBALAMIN) 1000 MCG tablet Take 1,000 mcg by mouth daily.    Allergies:   Codeine; Fentanyl; and Lexiscan [regadenoson]   Social History:  The patient  reports that he has never smoked. He has never used smokeless tobacco. He reports that he does not drink alcohol or use drugs.   Family History:  The patient's family history includes Heart disease in his mother.  ROS:   Review of Systems  Constitutional: Positive for malaise/fatigue. Negative for chills, diaphoresis, fever and weight loss.  HENT: Negative for congestion.   Eyes: Negative for discharge and redness.  Respiratory: Negative for cough, hemoptysis, sputum production, shortness of breath and wheezing.   Cardiovascular: Negative for chest pain, palpitations, orthopnea, claudication, leg swelling and PND.  Gastrointestinal: Negative for abdominal pain, blood in stool, heartburn, melena, nausea and vomiting.  Genitourinary: Negative for hematuria.  Musculoskeletal: Positive for joint pain and myalgias.  Negative for falls.       Left hip and leg pain  Skin: Negative for rash.  Neurological: Negative for dizziness, tingling, tremors, sensory change, speech change, focal weakness, loss of consciousness and weakness.  Endo/Heme/Allergies: Does not bruise/bleed easily.  Psychiatric/Behavioral: Negative for substance abuse. The patient has insomnia. The patient is not nervous/anxious.   All other systems reviewed and are negative.    PHYSICAL EXAM:  VS:  BP (!) 152/70 (BP Location: Left Arm, Patient Position: Sitting, Cuff Size: Normal)   Pulse 73   Ht 6' (1.829 m)   Wt 215 lb 8 oz (97.8 kg)   BMI 29.23 kg/m  BMI: Body mass index is 29.23 kg/m.  Physical Exam  Constitutional: He is oriented to person, place, and time. He appears well-developed and well-nourished.  HENT:  Head: Normocephalic and atraumatic.  Eyes: Right eye exhibits no  discharge. Left eye exhibits no discharge.  Neck: Normal range of motion. No JVD present.  Cardiovascular: Normal rate, S1 normal and S2 normal. An irregularly irregular rhythm present. Exam reveals no distant heart sounds, no friction rub, no midsystolic click and no opening snap.  Murmur heard. High-pitched blowing holosystolic murmur is present with a grade of 2/6 at the apex. Pulses:      Posterior tibial pulses are 2+ on the right side and 2+ on the left side.  Pulmonary/Chest: Effort normal and breath sounds normal. No respiratory distress. He has no decreased breath sounds. He has no wheezes. He has no rales. He exhibits no tenderness.  Abdominal: Soft. He exhibits no distension. There is no abdominal tenderness.  Musculoskeletal:        General: No edema.  Neurological: He is alert and oriented to person, place, and time.  Skin: Skin is warm and dry. No cyanosis. Nails show no clubbing.  Psychiatric: He has a normal mood and affect. His speech is normal and behavior is normal. Judgment and thought content normal.     EKG:  Was ordered and  interpreted by me today. Shows Afib, 73 bpm, left axis deviation, poor R wave progression, no acute st/t changes   Recent Labs: 01/30/2019: ALT 17 02/17/2019: TSH 0.997 02/18/2019: BUN 21; Creatinine, Ser 0.84; Hemoglobin 13.6; Magnesium 2.2; Platelets 192; Potassium 4.4; Sodium 134  No results found for requested labs within last 8760 hours.   Estimated Creatinine Clearance: 87.9 mL/min (by C-G formula based on SCr of 0.84 mg/dL).   Wt Readings from Last 3 Encounters:  02/25/19 215 lb 8 oz (97.8 kg)  02/17/19 215 lb (97.5 kg)  01/30/19 220 lb (99.8 kg)     Other studies reviewed: Additional studies/records reviewed today include: summarized above  ASSESSMENT AND PLAN:  1. Persistent Afib: He remains in Afib with controlled ventricular response. No longer on verapamil secondary to bradycardic heart rates noted while admitted. Zio patch will be applied today to evaluate heart rate and potential pauses that could require pacing. Continue to avoid AV nodal blocking agents at this time. CHADS2VASc 5 (HTN, age x 2, stroke x 2). OAC has been deferred given prior SAH with reported recommendation to avoid anticoagulation. It would be helpful to gain input from neurosurgery regarding the patient's risk of anticoagulation given his SAH. He declines this referral at this time. This can be revisited as COVID-19 pandemic improves. Given this, we will continue with rate control strategy.   2. Frequent falls: Cardiac monitoring as above. Continue to follow up with PCP and orthopedics. No falls since his discharge.   3. HTN: Blood pressure is mildly elevated today. However, he states he has a history of white coat hypertension. BP at home has been between 125-135 systolic. It has previously been recommended a degree of permissive BP could be tolerated. Continue current therapy.   4. PAH:  Continue gentle diuresis with Lasix. Recent BMP showed stable renal function. He needs an sleep study, though this  referral will be deferred given he is not sleeping well secondary to left hip/leg pain.   5. History of elevated troponin: Isolated value of 0.07 without symptoms of angina. He has a documented adverse reaction to Lexiscan leading to cardiac arrest. Given his echo demonstrated preserved LVSF and he was without symptoms concerning for angina, as well as in light of the COVID-19 pandemic with regards to not treadmilling a patient in an effort to reduce aerosolization, we will defer stress testing  at this time. If he becomes symptomatic we could consider an ETT, stress echo, treadmill Myoview, or coronary CTA. Continue ASA and statin.   6. Mitral regurgitation: Mild to moderate on recent echo. Continue to monitor.   7. Insomnia: In the setting of left hip/leg pain. He asked that I route a message to his PCP for assistant with this.   8. Prior stroke: Continue ASA and statin per PCP.   Disposition: F/u with Dr. Kirke Corin or an APP in 6 weeks.   Current medicines are reviewed at length with the patient today.  The patient did not have any concerns regarding medicines.  Signed, Eula Listen, PA-C 02/25/2019 10:01 AM     [facility] [place] [address] [place], [place] [address] [phone]

## 2019-02-24 ENCOUNTER — Encounter: Payer: Self-pay | Admitting: Physician Assistant

## 2019-02-25 ENCOUNTER — Other Ambulatory Visit: Payer: Self-pay

## 2019-02-25 ENCOUNTER — Telehealth: Payer: Self-pay | Admitting: Physician Assistant

## 2019-02-25 ENCOUNTER — Encounter: Payer: Self-pay | Admitting: Physician Assistant

## 2019-02-25 ENCOUNTER — Ambulatory Visit (INDEPENDENT_AMBULATORY_CARE_PROVIDER_SITE_OTHER): Payer: Medicare Other | Admitting: Physician Assistant

## 2019-02-25 ENCOUNTER — Ambulatory Visit (INDEPENDENT_AMBULATORY_CARE_PROVIDER_SITE_OTHER): Payer: Medicare Other

## 2019-02-25 VITALS — BP 152/70 | HR 73 | Ht 72.0 in | Wt 215.5 lb

## 2019-02-25 DIAGNOSIS — I4819 Other persistent atrial fibrillation: Secondary | ICD-10-CM

## 2019-02-25 DIAGNOSIS — I2721 Secondary pulmonary arterial hypertension: Secondary | ICD-10-CM

## 2019-02-25 DIAGNOSIS — R002 Palpitations: Secondary | ICD-10-CM

## 2019-02-25 DIAGNOSIS — I10 Essential (primary) hypertension: Secondary | ICD-10-CM | POA: Diagnosis not present

## 2019-02-25 DIAGNOSIS — R296 Repeated falls: Secondary | ICD-10-CM | POA: Diagnosis not present

## 2019-02-25 DIAGNOSIS — I340 Nonrheumatic mitral (valve) insufficiency: Secondary | ICD-10-CM

## 2019-02-25 DIAGNOSIS — G4700 Insomnia, unspecified: Secondary | ICD-10-CM

## 2019-02-25 DIAGNOSIS — M79605 Pain in left leg: Secondary | ICD-10-CM

## 2019-02-25 DIAGNOSIS — R7989 Other specified abnormal findings of blood chemistry: Secondary | ICD-10-CM

## 2019-02-25 DIAGNOSIS — R778 Other specified abnormalities of plasma proteins: Secondary | ICD-10-CM

## 2019-02-25 NOTE — Telephone Encounter (Signed)
Noted  

## 2019-02-25 NOTE — Telephone Encounter (Signed)
Call received from [employer]. Rep advised there is first documented A-fib at 10:20 am todayfor the patient.  - HR ~ 53-90 bpm (average HR- 71 bpm) - several reports came through after that with documented A-fib, last one was 10:40 am still showing A-fib.   Patient has known persistent Afib. He was seen by Eula Listen, PA today.  Will forward to [place], [place] as an [place].

## 2019-02-25 NOTE — Patient Instructions (Signed)
Medication Instructions:  Your physician recommends that you continue on your current medications as directed. Please refer to the Current Medication list given to you today.  If you need a refill on your cardiac medications before your next appointment, please call your pharmacy.   Lab work: None ordered  If you have labs (blood work) drawn today and your tests are completely normal, you will receive your results only by: Marland Kitchen MyChart Message (if you have MyChart) OR . A paper copy in the mail If you have any lab test that is abnormal or we need to change your treatment, we will call you to review the results.  Testing/Procedures: A zio monitor was placed today. It will remain on for 14 days. You will then return monitor and event diary in provided box. It takes 1-2 weeks for report to be downloaded and returned to [place]. We will call you with the results. If monitor falls of or has orange flashing light, please call Zio for further instructions.     Follow-Up: At [facility], you and your health needs are our priority.  As part of our continuing mission to provide you with exceptional heart care, we have created designated Provider Care Teams.  These Care Teams include your primary Cardiologist (physician) and Advanced Practice Providers (APPs -  Physician Assistants and Nurse Practitioners) who all work together to provide you with the care you need, when you need it. You will need a follow up appointment in 6 weeks.  Please call our office 2 months in advance to schedule this appointment.  You may see Dr. Kirke Corin or Eula Listen, PA-C.

## 2019-02-25 NOTE — Telephone Encounter (Signed)
pt has in office appt. will review zio instructions with patient when she is in office.

## 2019-03-17 ENCOUNTER — Telehealth: Payer: Self-pay

## 2019-03-17 NOTE — Telephone Encounter (Signed)
Virtual Visit Pre-Appointment Phone Call  "[name], I am calling you today to discuss your upcoming appointment. We are currently trying to limit exposure to the virus that causes COVID-19 by seeing patients at home rather than in the office."  1. "What is the BEST phone number to call the day of the visit?" - include this in appointment notes  2. Do you have or have access to (through a family member/friend) a smartphone with video capability that we can use for your visit?" a. If yes - list this number in appt notes as cell (if different from BEST phone #) and list the appointment type as a VIDEO visit in appointment notes b. If no - list the appointment type as a PHONE visit in appointment notes  3. Confirm consent - "In the setting of the current Covid19 crisis, you are scheduled for a phone visit with your provider on 04/08/2019 at 1:40PM.  Just as we do with many in-office visits, in order for you to participate in this visit, we must obtain consent.  If you'd like, I can send this to your mychart (if signed up) or email for you to review.  Otherwise, I can obtain your verbal consent now.  All virtual visits are billed to your insurance company just like a normal visit would be.  By agreeing to a virtual visit, we'd like you to understand that the technology does not allow for your provider to perform an examination, and thus may limit your provider's ability to fully assess your condition. If your provider identifies any concerns that need to be evaluated in person, we will make arrangements to do so.  Finally, though the technology is pretty good, we cannot assure that it will always work on either your or our end, and in the setting of a video visit, we may have to convert it to a phone-only visit.  In either situation, we cannot ensure that we have a secure connection.  Are you willing to proceed?" STAFF: Did the patient verbally acknowledge consent to telehealth visit? Document YES/NO  here: YES  4. Advise patient to be prepared - "Two hours prior to your appointment, go ahead and check your blood pressure, pulse, oxygen saturation, and your weight (if you have the equipment to check those) and write them all down. When your visit starts, your provider will ask you for this information. If you have an Apple Watch or Kardia device, please plan to have heart rate information ready on the day of your appointment. Please have a pen and paper handy nearby the day of the visit as well."  5. Give patient instructions for MyChart download to smartphone OR [email or web address] as below if video visit (depending on what platform provider is using)  6. Inform patient they will receive a phone call 15 minutes prior to their appointment time (may be from unknown caller ID) so they should be prepared to answer    TELEPHONE CALL NOTE  [name] has been deemed a candidate for a follow-up tele-health visit to limit community exposure during the Covid-19 pandemic. I spoke with the patient via phone to ensure availability of phone/video source, confirm preferred email & phone number, and discuss instructions and expectations.  I reminded [name] to be prepared with any vital sign and/or heart rhythm information that could potentially be obtained via home monitoring, at the time of his visit. I reminded [name] to expect a phone call prior to his visit.  Tommie SamsBrandy L Newcomer McClain 03/17/2019 11:04 AM    FULL LENGTH CONSENT FOR TELE-HEALTH VISIT   I hereby voluntarily request, consent and authorize [facility] and its employed or contracted physicians, physician assistants, nurse practitioners or other licensed health care professionals (the Practitioner), to provide me with telemedicine health care services (the Services") as deemed necessary by the treating Practitioner. I acknowledge and consent to receive the Services by the Practitioner via telemedicine. I  understand that the telemedicine visit will involve communicating with the Practitioner through live audiovisual communication technology and the disclosure of certain medical information by electronic transmission. I acknowledge that I have been given the opportunity to request an in-person assessment or other available alternative prior to the telemedicine visit and am voluntarily participating in the telemedicine visit.  I understand that I have the right to withhold or withdraw my consent to the use of telemedicine in the course of my care at any time, without affecting my right to future care or treatment, and that the Practitioner or I may terminate the telemedicine visit at any time. I understand that I have the right to inspect all information obtained and/or recorded in the course of the telemedicine visit and may receive copies of available information for a reasonable fee.  I understand that some of the potential risks of receiving the Services via telemedicine include:   Delay or interruption in medical evaluation due to technological equipment failure or disruption;  Information transmitted may not be sufficient (e.g. poor resolution of images) to allow for appropriate medical decision making by the Practitioner; and/or   In rare instances, security protocols could fail, causing a breach of personal health information.  Furthermore, I acknowledge that it is my responsibility to provide information about my medical history, conditions and care that is complete and accurate to the best of my ability. I acknowledge that Practitioner's advice, recommendations, and/or decision may be based on factors not within their control, such as incomplete or inaccurate data provided by me or distortions of diagnostic images or specimens that may result from electronic transmissions. I understand that the practice of medicine is not an exact science and that Practitioner makes no warranties or guarantees  regarding treatment outcomes. I acknowledge that I will receive a copy of this consent concurrently upon execution via email to the email address I last provided but may also request a printed copy by calling the office of [facility].    I understand that my insurance will be billed for this visit.   I have read or had this consent read to me.  I understand the contents of this consent, which adequately explains the benefits and risks of the Services being provided via telemedicine.   I have been provided ample opportunity to ask questions regarding this consent and the Services and have had my questions answered to my satisfaction.  I give my informed consent for the services to be provided through the use of telemedicine in my medical care  By participating in this telemedicine visit I agree to the above.

## 2019-03-19 ENCOUNTER — Other Ambulatory Visit: Payer: Self-pay

## 2019-03-21 ENCOUNTER — Encounter: Admission: RE | Admit: 2019-03-21 | Payer: Medicare Other | Source: Ambulatory Visit

## 2019-03-27 ENCOUNTER — Telehealth: Payer: Self-pay

## 2019-03-27 NOTE — Telephone Encounter (Signed)
Call to patient to review results from zio monitor.   Spoke to patient and answered all questions.   No new orders at this time.   Advised pt to call for any further questions or concerns.

## 2019-03-27 NOTE — Telephone Encounter (Signed)
-----   Message from Rise Mu, PA-C sent at 03/27/2019  1:57 PM EDT ----- Outpatient cardiac monitoring showed chronic A. fib with an average heart rate of 78 bpm.  Lowest heart rate was 46 bpm occurring at 1:40 AM with highest heart rate 135 bpm.  Occasional PVCs with an overall burden of 1.4%.  There were no significant pauses.  No indication for pacemaker.  Given well-controlled ventricular response noted on outpatient cardiac monitoring we will not add rate limiting medication at this time.  Continue current therapy.

## 2019-04-08 ENCOUNTER — Telehealth (INDEPENDENT_AMBULATORY_CARE_PROVIDER_SITE_OTHER): Payer: Medicare Other | Admitting: Cardiovascular Disease

## 2019-04-08 ENCOUNTER — Encounter: Payer: Self-pay | Admitting: Cardiovascular Disease

## 2019-04-08 ENCOUNTER — Other Ambulatory Visit: Payer: Self-pay

## 2019-04-08 ENCOUNTER — Encounter: Payer: Self-pay | Admitting: Neurology

## 2019-04-08 ENCOUNTER — Ambulatory Visit: Payer: Medicare Other | Admitting: Cardiovascular Disease

## 2019-04-08 VITALS — BP 124/78 | HR 74 | Ht 72.0 in | Wt 217.0 lb

## 2019-04-08 DIAGNOSIS — I482 Chronic atrial fibrillation, unspecified: Secondary | ICD-10-CM | POA: Diagnosis not present

## 2019-04-08 DIAGNOSIS — I10 Essential (primary) hypertension: Secondary | ICD-10-CM

## 2019-04-08 DIAGNOSIS — Z8679 Personal history of other diseases of the circulatory system: Secondary | ICD-10-CM

## 2019-04-08 NOTE — Progress Notes (Signed)
Virtual Visit via Telephone Note   This visit type was conducted due to national recommendations for restrictions regarding the COVID-19 Pandemic (e.g. social distancing) in an effort to limit this patient's exposure and mitigate transmission in our community.  Due to his co-morbid illnesses, this patient is at least at moderate risk for complications without adequate follow up.  This format is felt to be most appropriate for this patient at this time.  The patient did not have access to video technology/had technical difficulties with video requiring transitioning to audio format only (telephone).  All issues noted in this document were discussed and addressed.  No physical exam could be performed with this format.  Please refer to the patient's chart for his  consent to telehealth for [facility].   Date:  04/08/2019   ID:  [name], DOB July 25, 1941, MRN [id number]  Patient Location: Home Provider Location: Office  PCP:  [name], MD  Cardiologist:  No primary care provider on file.  Electrophysiologist:  None   Evaluation Performed:  Follow-Up Visit  Chief Complaint: Doing well with no complaints.  History of Present Illness:    [name] is a 78 y.o. male was reached via phone for follow-up visit regarding persistent atrial fibrillation.  He has history of subarachnoid hemorrhage, previous stroke in December 2013, seizure disorder, hypertension, hyperlipidemia, and multiple falls. The patient had multiple ED visits in May with weakness and falls.  He was found to have a large tear of the left superior labrum.  MRI of the spine showed large L2-L3 disc extrusion. During 1 of his admissions in May, he was found to be in atrial fibrillation with RVR.  Echo showed an EF of 55 to 60%, moderate pulmonary hypertension, mild to moderate mitral regurgitation and mild aortic insufficiency.  He did have episodes of bradycardia with pauses up to two-point 7 at night.  He was  not placed on anticoagulation given prior subarachnoid hemorrhage.  Verapamil was discontinued.  He had a ZIO Patch monitor which showed chronic atrial fibrillation with an average heart rate of 78 bpm.  Lowest heart rate was 46 bpm.  Occasional PVCs with an overall burden of 1.4%.  He has been doing well with no recent chest pain, shortness of breath or palpitations.  No syncope or presyncope.  The patient does have symptoms concerning for COVID-19 infection (fever, chills, cough, or new shortness of breath).    Past Medical History:  Diagnosis Date  . Arthritis    "lower back on down; into all joints; into my feet" (09/23/2012)  . Chronic low back pain    hx of  . Complication of anesthesia    "cardiac arrest with dye from [place] [04/15/10 records indicate bradycardia, no ischemia, EF 56%], unstable BP with anesthesia[  . GERD (gastroesophageal reflux disease)   . [place]; 1989   "take RX daily" (23-Sep-2012)  . Herniated disc, cervical   . Hypercholesteremia   . Hypertension    sees Dr. [name], [place] [facility] in [place]  . Intracranial bleed [facility])    december 2010  . Kidney stones    "passed on their own" (September 23, 2012)  . Migraines    "2 in my life" (09/23/2012)  . PAH (pulmonary artery hypertension) ([place])   . Persistent atrial fibrillation   . Retina disorder    hx of torn retina  . Stroke ([place]) 2002   LLE "just a little bit weaker" (09-23-12)   Past Surgical History:  Procedure Laterality Date  . BLEPHAROPLASTY  1990's   "right eye" (09/16/2012)  . CARDIOVASCULAR STRESS TEST     nl perfusion w/o ischemia, EF 56%, bradycardia without syncope 04/15/10 (Dr. Harold HedgeKenneth Fath)  . CATARACT EXTRACTION W/ INTRAOCULAR LENS IMPLANT  1980's   "right" (09/17/2012)  . COLONOSCOPY     06/26/09  . EYE MUSCLE SURGERY  1980's   "right eye" (09/17/2012)  . INGUINAL HERNIA REPAIR  04/10/2011   "right" (09/17/2012)  . JOINT REPLACEMENT    . POSTERIOR LUMBAR FUSION   09/16/2012   "L4-5" (09/17/2012)  . RETINAL DETACHMENT SURGERY  1980's   "right" (09/17/2012)  . TOTAL KNEE ARTHROPLASTY  04/28/2010   "left" (09/17/2012)     Current Meds  Medication Sig  . aliskiren (TEKTURNA) 300 MG tablet Take 300 mg by mouth daily.   . carbamazepine (TEGRETOL) 200 MG tablet Take 200 mg by mouth 2 (two) times a day.   . doxazosin (CARDURA) 1 MG tablet Take 1 mg by mouth at bedtime.   . furosemide (LASIX) 20 MG tablet Take 20 mg by mouth daily.   . irbesartan (AVAPRO) 300 MG tablet Take 300 mg by mouth daily.  . pantoprazole (PROTONIX) 40 MG tablet Take 40 mg by mouth at bedtime.   . simvastatin (ZOCOR) 20 MG tablet Take 20 mg by mouth 2 (two) times daily.      Allergies:   Codeine, Fentanyl, and Regadenoson   Social History   Tobacco Use  . Smoking status: Never Smoker  . Smokeless tobacco: Never Used  Substance Use Topics  . Alcohol use: No  . Drug use: No     Family Hx: The patient's family history includes Heart disease in his mother.  ROS:   Please see the history of present illness.     All other systems reviewed and are negative.   Prior CV studies:   The following studies were reviewed today:    Labs/Other Tests and Data Reviewed:    EKG:  No ECG reviewed.  Recent Labs: 01/30/2019: ALT 17 02/17/2019: TSH 0.997 02/18/2019: BUN 21; Creatinine, Ser 0.84; Hemoglobin 13.6; Magnesium 2.2; Platelets 192; Potassium 4.4; Sodium 134   Recent Lipid Panel No results found for: CHOL, TRIG, HDL, CHOLHDL, LDLCALC, LDLDIRECT  Wt Readings from Last 3 Encounters:  04/08/19 217 lb (98.4 kg)  02/25/19 215 lb 8 oz (97.8 kg)  02/17/19 215 lb (97.5 kg)     Objective:    Vital Signs:  BP 124/78   Pulse 74   Ht 6' (1.829 m)   Wt 217 lb (98.4 kg)   BMI 29.43 kg/m    VITAL SIGNS:  reviewed  ASSESSMENT & PLAN:    1. Chronic Afib: He remains in Afib with controlled ventricular response. No longer on verapamil secondary to bradycardic heart rates  noted while admitted. Continue to avoid AV nodal blocking agents at this time. CHADS2VASc 5 (HTN, age x 2, stroke x 2). OAC has been deferred given prior SAH with reported recommendation to avoid anticoagulation.  I am going to refer the patient to neurosurgery to see if it is safe to anticoagulate the patient.    2. Frequent falls: Cardiac monitoring as above. Continue to follow up with PCP and orthopedics. No falls since his discharge.   3. Essential hypertension: Blood pressures controlled on current medications.    4. PAH:  Continue gentle diuresis with Lasix.    5. Mitral regurgitation: Mild to moderate on recent echo. Continue to monitor.  COVID-19 Education: The signs and symptoms of COVID-19 were discussed with the patient and how to seek care for testing (follow up with PCP or arrange E-visit).  The importance of social distancing was discussed today.  Time:   Today, I have spent 8 minutes with the patient with telehealth technology discussing the above problems.     Medication Adjustments/Labs and Tests Ordered: Current medicines are reviewed at length with the patient today.  Concerns regarding medicines are outlined above.   Tests Ordered: No orders of the defined types were placed in this encounter.   Medication Changes: No orders of the defined types were placed in this encounter.   Follow Up:  In Person in 4 month(s)  Signed, Lorine BearsMuhammad Arida, MD  04/08/2019 1:41 PM    [facility]

## 2019-04-08 NOTE — Addendum Note (Signed)
Addended by: Lamar Laundry on: 04/08/2019 02:18 PM   Modules accepted: Orders

## 2019-04-08 NOTE — Patient Instructions (Signed)
Medication Instructions:  Decrease aspirin to 81 mg once daily If you need a refill on your cardiac medications before your next appointment, please call your pharmacy.   Lab work: None If you have labs (blood work) drawn today and your tests are completely normal, you will receive your results only by: Marland Kitchen MyChart Message (if you have MyChart) OR . A paper copy in the mail If you have any lab test that is abnormal or we need to change your treatment, we will call you to review the results.  Testing/Procedures: None  Follow-Up: At [facility], you and your health needs are our priority.  As part of our continuing mission to provide you with exceptional heart care, we have created designated Provider Care Teams.  These Care Teams include your primary Cardiologist (physician) and Advanced Practice Providers (APPs -  Physician Assistants and Nurse Practitioners) who all work together to provide you with the care you need, when you need it. You will need a follow up appointment in 4 months.  Please call our office 2 months in advance to schedule this appointment.  You may see No primary care provider on file. or one of the following Advanced Practice Providers on your designated Care Team:   Murray Hodgkins, NP Christell Faith, PA-C . Marrianne Mood, PA-C  Any Other Special Instructions Will Be Listed Below (If Applicable). Refer to neurosurgery to see if he is a candidate for anticoagulation given previous history of subarachnoid hemorrhage

## 2019-04-10 ENCOUNTER — Ambulatory Visit: Payer: Medicare Other | Admitting: Cardiovascular Disease

## 2019-04-29 ENCOUNTER — Ambulatory Visit: Admit: 2019-04-29 | Payer: Medicare Other | Admitting: Internal Medicine

## 2019-04-29 SURGERY — ESOPHAGOGASTRODUODENOSCOPY (EGD) WITH PROPOFOL
Anesthesia: General

## 2019-05-13 ENCOUNTER — Ambulatory Visit: Payer: Medicare Other | Admitting: Neurology

## 2019-05-21 ENCOUNTER — Other Ambulatory Visit: Payer: Self-pay

## 2019-05-21 ENCOUNTER — Encounter: Payer: Self-pay | Admitting: Nurse Practitioner

## 2019-05-21 ENCOUNTER — Ambulatory Visit: Payer: Medicare Other | Admitting: Nurse Practitioner

## 2019-05-21 VITALS — BP 150/78 | HR 61 | Ht 72.0 in | Wt 217.0 lb

## 2019-05-21 DIAGNOSIS — I482 Chronic atrial fibrillation, unspecified: Secondary | ICD-10-CM | POA: Diagnosis not present

## 2019-05-21 DIAGNOSIS — I10 Essential (primary) hypertension: Secondary | ICD-10-CM | POA: Diagnosis not present

## 2019-05-21 DIAGNOSIS — R600 Localized edema: Secondary | ICD-10-CM | POA: Diagnosis not present

## 2019-05-21 DIAGNOSIS — I2721 Secondary pulmonary arterial hypertension: Secondary | ICD-10-CM

## 2019-05-21 DIAGNOSIS — I5043 Acute on chronic combined systolic (congestive) and diastolic (congestive) heart failure: Secondary | ICD-10-CM | POA: Diagnosis not present

## 2019-05-21 DIAGNOSIS — I340 Nonrheumatic mitral (valve) insufficiency: Secondary | ICD-10-CM

## 2019-05-21 MED ORDER — FUROSEMIDE 20 MG PO TABS: 40.0000 mg | ORAL_TABLET | Freq: Every day | ORAL | 3 refills | Status: DC

## 2019-05-21 NOTE — Patient Instructions (Signed)
Medication Instructions:  1- STOP Amlodipine  2- INCREASE Lasix to 2 tablets (40 mg total) once daily   If you need a refill on your cardiac medications before your next appointment, please call your pharmacy.   Lab work: Your physician recommends that you return for lab work in: 1 week at the medical mall. No appt is needed. Hours are M-F 7AM- 6 PM.  If you have labs (blood work) drawn today and your tests are completely normal, you will receive your results only by: Marland Kitchen MyChart Message (if you have MyChart) OR . A paper copy in the mail If you have any lab test that is abnormal or we need to change your treatment, we will call you to review the results.  Testing/Procedures: None ordered   Follow-Up: At [facility], you and your health needs are our priority.  As part of our continuing mission to provide you with exceptional heart care, we have created designated Provider Care Teams.  These Care Teams include your primary Cardiologist (physician) and Advanced Practice Providers (APPs -  Physician Assistants and Nurse Practitioners) who all work together to provide you with the care you need, when you need it. You will need a follow up appointment in 2 weeks. You may see Kathlyn Sacramento, MD or Murray Hodgkins, NP.

## 2019-05-21 NOTE — Progress Notes (Signed)
Office Visit    Patient Name: [name] Date of Encounter: 05/21/2019  Primary Care Provider:  Jerl Mina, MD Primary Cardiologist:  Lorine Bears, MD  Chief Complaint    78 year old male with a history of hypertension, hyperlipidemia, prior stroke, subarachnoid hemorrhage (2010), seizures, gait instability and frequent falls, and recent diagnosis of permanent atrial fibrillation, who presents for follow-up related to lower extremity swelling.  Past Medical History    Past Medical History:  Diagnosis Date  . Arthritis    "lower back on down; into all joints; into my feet" (09-29-12)  . Chronic low back pain    hx of  . Complication of anesthesia    "cardiac arrest with dye from lexiscan [04/15/10 records indicate bradycardia, no ischemia, EF 56%], unstable BP with anesthesia[  . GERD (gastroesophageal reflux disease)   . 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 Herniated disc, cervical   . Hypercholesteremia   . Hypertension    sees Dr. Jerl Mina, [facility] in [place]  . Intracranial bleed [facility])    Sep 29, 2009 . Kidney stones    "passed on their own" (09/29/12)  . Migraines    "2 in my life" (09-29-12)  . Mitral regurgitation    a. 01/2019 Echo: EF 55-60%, mild conc LVH. RVSP 59.40mmHg. Mildly dil LA. Mild to mod MR. Mild AI.  Marland Kitchen PAH (pulmonary artery hypertension) (HCC)   . Permanent atrial fibrillation    a. CHA2DS2VASc = 5-->No OAC 2/2 h/o SAH.  Marland Kitchen Retina disorder    hx of torn retina  . Stroke (HCC) 2002   LLE "just a little bit weaker" (Sep 29, 2012)  . Subarachnoid hemorrhage (HCC) 09-29-2009   Past Surgical History:  Procedure Laterality Date  . BLEPHAROPLASTY  1990's   "right eye" (09/16/2012)  . CARDIOVASCULAR STRESS TEST     nl perfusion w/o ischemia, EF 56%, bradycardia without syncope 04/15/10 (Dr. Harold Hedge)  . CATARACT EXTRACTION W/ INTRAOCULAR LENS IMPLANT  1980's   "right" (09/29/12)  . COLONOSCOPY     06/26/09  . EYE MUSCLE SURGERY  1980's   "right eye" (29-Sep-2012)  . INGUINAL HERNIA REPAIR  04/10/2011   "right" (09-29-2012)  . JOINT REPLACEMENT    . POSTERIOR LUMBAR FUSION  09/16/2012   "L4-5" (2012/09/29)  . RETINAL DETACHMENT SURGERY  1980's   "right" (09-29-12)  . TOTAL KNEE ARTHROPLASTY  04/28/2010   "left" (Sep 29, 2012)    Allergies  Allergies  Allergen Reactions  . Codeine Nausea Only and Other (See Comments)  . Fentanyl Other (See Comments)    Given before knee surgery, blood pressure became extremely low  . Regadenoson Other (See Comments)    Created cardiac arrest    History of Present Illness    78 year old male with the above complex past medical history including hypertension, hyperlipidemia, prior stroke, subarachnoid hemorrhage (2010), seizures, gait instability and frequent falls, and recent diagnosis of permanent atrial fibrillation.  In May, he had multiple ER visits in the setting of weakness and falls.  He was found to have a large tear of the left superior labrum and MRI of the spine showed a large L2-3 disc extrusion.  During 1 of his admissions in May, he was found to be in atrial fibrillation with rapid ventricular response.  Echocardiogram showed normal LV function with moderate pulmonary hypertension, mild to moderate mitral digitation, and mild aortic insufficiency.  He was initially on verapamil therapy for rate control however, this was  discontinued in the setting of pauses up to 2.7 seconds at night.  Follow-up Zio patch showed chronic A. fib with an average heart rate of 78 bpm.  Of note, he has not been anticoagulated in the setting of prior subarachnoid hemorrhage in 2010.  He was seen via telehealth visit by Dr. Kirke CorinArida in July, at which time he was doing well.  He was referred to neurology to evaluate whether or not he could be orally anticoagulated given history of SAH.  He does not follow-up with neurology until the end of September.  Patient says that  over the past 3+ weeks, he has been noticing significant increase in bilateral lower extremity edema.  He has not had any dyspnea, palpitations, chest pain, PND, orthopnea, dizziness, syncope, or early satiety.  He notes that he never had this issue when on verapamil in the past and thinks that perhaps amlodipine may be playing a role.  His lower extremity swelling is bothersome because he already has gait instability and swelling of his feet only makes that worse.  Home Medications    Prior to Admission medications   Medication Sig Start Date End Date Taking? Authorizing Provider  aliskiren (TEKTURNA) 300 MG tablet Take 300 mg by mouth daily.    Yes [provider]  amLODipine (NORVASC) 5 MG tablet Take 5 mg by mouth daily.   Yes [provider]  amoxicillin (AMOXIL) 500 MG tablet Take 500 mg by mouth as directed. Dental procedure only   Yes [provider]  aspirin 325 MG tablet Take 325 mg by mouth daily.   Yes [provider]  carbamazepine (TEGRETOL) 200 MG tablet Take 200 mg by mouth 2 (two) times a day.    Yes [provider]  cetirizine (ZYRTEC) 10 MG tablet Take 10 mg by mouth daily as needed for allergies.    Yes [provider]  doxazosin (CARDURA) 1 MG tablet Take 1 mg by mouth at bedtime.    Yes [provider]  furosemide (LASIX) 20 MG tablet Take 20 mg by mouth daily.    Yes [provider]  irbesartan (AVAPRO) 300 MG tablet Take 300 mg by mouth daily. 11/19/17  Yes [provider]  Omega-3 Fatty Acids (FISH OIL) 1200 MG CAPS Take 1,200 mg by mouth daily.   Yes [provider]  pantoprazole (PROTONIX) 40 MG tablet Take 40 mg by mouth at bedtime.    Yes [provider]  simvastatin (ZOCOR) 20 MG tablet Take 20 mg by mouth 2 (two) times daily.    Yes [provider]  vitamin B-12 (CYANOCOBALAMIN) 1000 MCG tablet Take 1,000 mcg by mouth daily.   Yes [provider]     Review of Systems    Gait instability and falls with recent lower extremity swelling.  He denies chest pain, palpitations, dyspnea, PND, orthopnea, dizziness, syncope, or early satiety.  All other systems reviewed and are otherwise negative except as noted above.  Physical Exam    VS:  BP (!) 150/78 (BP Location: Left Arm, Patient Position: Sitting, Cuff Size: Normal)   Pulse 61   Ht 6' (1.829 m)   Wt 217 lb (98.4 kg)   SpO2 97%   BMI 29.43 kg/m  , BMI Body mass index is 29.43 kg/m. GEN: Well nourished, well developed, in no acute distress. HEENT: normal. Neck: Supple, no JVD, carotid bruits, or masses. Cardiac: Irregularly irregular, no murmurs, rubs, or gallops. No clubbing, cyanosis, 1+ bilateral lower extremity edema  to the midcalf.  Radials/DP/PT 1+ and equal bilaterally.  Respiratory:  Respirations regular and unlabored, clear to auscultation bilaterally. GI: Soft, nontender, nondistended, BS + x 4. MS: no deformity or atrophy. Skin: warm and dry, no rash. Neuro:  Strength and sensation are intact. Psych: Normal affect.  Accessory Clinical Findings    ECG personally reviewed by me today -atrial fibrillation, 65, left axis, left anterior fascicular block, prior septal infarct- no acute changes.  Lab Results  Component Value Date   WBC 9.9 02/18/2019   HGB 13.6 02/18/2019   HCT 41.1 02/18/2019   MCV 97.9 02/18/2019   PLT 192 02/18/2019   Lab Results  Component Value Date   CREATININE 0.84 02/18/2019   BUN 21 02/18/2019   NA 134 (L) 02/18/2019   K 4.4 02/18/2019   CL 99 02/18/2019   CO2 27 02/18/2019   Lab Results  Component Value Date   ALT 17 01/30/2019   AST 21 01/30/2019   ALKPHOS 82 01/30/2019   BILITOT 0.8 01/30/2019     Assessment & Plan    1.  Lower extremity edema/acute on chronic diastolic heart failure/PAH: Recent echo in May of this year showed normal LV function.  He was noted to have pulmonary hypertension at that time as well as mild to  moderate mitral regurgitation.  He was placed on amlodipine in the late spring in the setting of bradycardia on verapamil and over the past 3+ weeks, he has been noticing increasing lower extremity swelling.  It is possible that amlodipine is playing a role though persistent/permanent A. fib may also be playing a role.  He does not have any other heart failure symptoms but is bothered by his lower extremity edema.  I have asked him to discontinue his amlodipine and I will increase his furosemide to 40 mg daily.  He recently had labs with his primary care provider showing stable creatinine of 0.8 with a potassium of 4.3 on August 12.  He will have a follow-up basic metabolic panel in 1 week and I will plan to see him back in 2 weeks to reevaluate his edema and hypertension.  Though his pressure is elevated today, he says it typically runs much better than this at home.  His heart rate is well controlled off of AV nodal blocking agents.  2.  Permanent atrial fibrillation: Well-controlled off of AV nodal blocking agents.  Not currently anticoagulated though he does take aspirin 325 mg daily, which he has been on long-term.  He does have follow-up with neurology in September to assess whether or not it would be safe to switch him from aspirin to Eliquis in the setting of permanent A. Fib.  3.  Essential hypertension: Blood pressure is elevated today however he notes that it runs in the 120s to 130s at home.  He is on maximum dose of Tekturna and irbesartan.  Discontinue amlodipine today as I suspect it is contributing to lower extremity edema.  Titrating Lasix in the setting of known pulmonary hypertension and ongoing lower extremity edema.  Follow-up labs in 1 week and I will see him back in 2 weeks.  Could consider additional titration of his Cardura at that time.  4.  Hyperlipidemia: This is been managed by primary care.  LDL was 100 earlier this month.  He is on simvastatin therapy.  5.  Subarachnoid  hemorrhage: In 2010.  To follow-up with neurology as outlined above.  6.  Mitral Regurgitation: mild to mod by recent echo.  7.  Disposition: Follow-up basic metabolic panel in 1 week and follow-up in clinic in 2 weeks.  Nicolasa Duckinghristopher Jaleena Viviani, NP 05/21/2019, 6:06 PM

## 2019-05-27 ENCOUNTER — Telehealth: Payer: Self-pay | Admitting: Nurse Practitioner

## 2019-05-27 MED ORDER — FUROSEMIDE 20 MG PO TABS: 40.0000 mg | ORAL_TABLET | Freq: Every day | ORAL | 3 refills | Status: DC

## 2019-05-27 NOTE — Addendum Note (Signed)
Addended by: Verlon Au on: 05/27/2019 01:48 PM   Modules accepted: Orders

## 2019-05-27 NOTE — Telephone Encounter (Signed)
Needs verification for Furosemide 20 mg . Please call. Order # [id number]

## 2019-05-27 NOTE — Telephone Encounter (Signed)
Returned call to [facility]. They reported the RX for lasix was sent to the incorrect location. They provided correct information and Rx was resent.   Advised pt to call for any further questions or concerns.

## 2019-05-28 ENCOUNTER — Other Ambulatory Visit
Admission: RE | Admit: 2019-05-28 | Discharge: 2019-05-28 | Disposition: A | Discharge status: Home / Self Care | Payer: Medicare Other | Attending: Nurse Practitioner | Admitting: Nurse Practitioner

## 2019-05-28 ENCOUNTER — Telehealth: Payer: Self-pay

## 2019-05-28 DIAGNOSIS — I482 Chronic atrial fibrillation, unspecified: Secondary | ICD-10-CM | POA: Insufficient documentation

## 2019-05-28 LAB — BASIC METABOLIC PANEL
Anion gap: 9 (ref 5–15)
BUN: 15 mg/dL (ref 8–23)
CO2: 29 mmol/L (ref 22–32)
Calcium: 9.5 mg/dL (ref 8.9–10.3)
Chloride: 100 mmol/L (ref 98–111)
Creatinine, Ser: 1.01 mg/dL (ref 0.61–1.24)
GFR calc Af Amer: >60 mL/min (ref >60)
GFR calc non Af Amer: >60 mL/min (ref >60)
Glucose, Bld: 93 mg/dL (ref 70–99)
Potassium: 4.3 mmol/L (ref 3.5–5.1)
Sodium: 138 mmol/L (ref 135–145)

## 2019-05-28 NOTE — Telephone Encounter (Signed)
-----   Message from Theora Gianotti, NP sent at 05/28/2019  3:54 PM EDT ----- Renal fxn/lytes stable.

## 2019-05-28 NOTE — Telephone Encounter (Signed)
Notes recorded by Frederik Schmidt, RN on 05/28/2019 at 3:57 PM EDT  The patient's wife has been notified of the result and verbalized understanding. All questions (if any) were answered.  Frederik Schmidt, RN 05/28/2019 3:57 PM   ------

## 2019-06-04 ENCOUNTER — Ambulatory Visit (INDEPENDENT_AMBULATORY_CARE_PROVIDER_SITE_OTHER): Payer: Medicare Other | Admitting: Nurse Practitioner

## 2019-06-04 ENCOUNTER — Other Ambulatory Visit: Payer: Self-pay

## 2019-06-04 ENCOUNTER — Encounter: Payer: Self-pay | Admitting: Nurse Practitioner

## 2019-06-04 VITALS — BP 138/68 | HR 86 | Ht 72.0 in | Wt 216.0 lb

## 2019-06-04 DIAGNOSIS — E782 Mixed hyperlipidemia: Secondary | ICD-10-CM

## 2019-06-04 DIAGNOSIS — I10 Essential (primary) hypertension: Secondary | ICD-10-CM

## 2019-06-04 DIAGNOSIS — I4821 Permanent atrial fibrillation: Secondary | ICD-10-CM | POA: Diagnosis not present

## 2019-06-04 DIAGNOSIS — R600 Localized edema: Secondary | ICD-10-CM

## 2019-06-04 DIAGNOSIS — I5032 Chronic diastolic (congestive) heart failure: Secondary | ICD-10-CM | POA: Diagnosis not present

## 2019-06-04 NOTE — Progress Notes (Signed)
Office Visit    Patient Name: [name] Date of Encounter: 06/04/2019  Primary Care Provider:  Jerl MinaHedrick, James, MD Primary Cardiologist:  Lorine BearsMuhammad Arida, MD  Chief Complaint    78 year old male with a history of hypertension, hyperlipidemia, prior stroke, subarachnoid hemorrhage (2010), seizures, gait instability and frequent falls, and recent diagnosis of permanent atrial fibrillation, who presents for follow-up related to lower extremity swelling.  Past Medical History    Past Medical History:  Diagnosis Date  . Arthritis    "lower back on down; into all joints; into my feet" (09/17/2012)  . Chronic low back pain    hx of  . Complication of anesthesia    "cardiac arrest with dye from lexiscan (04/15/10 records indicate bradycardia, no ischemia, EF 56%), unstable BP with anesthesia)  . GERD (gastroesophageal reflux disease)   . 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 Herniated disc, cervical   . Hypercholesteremia   . Hypertension    sees Dr. Jerl MinaJames Hedrick, [facility] [place]  . Intracranial bleed [facility](HCC)    december 2010  . Kidney stones    "passed on their own" (09/17/2012)  . Migraines    "2 in my life" (09/17/2012)  . Mitral regurgitation    a. 01/2019 Echo: EF 55-60%, mild conc LVH. RVSP 59.355mmHg. Mildly dil LA. Mild to mod MR. Mild AI.  Marland Kitchen. PAH (pulmonary artery hypertension) (HCC)   . Permanent atrial fibrillation    a. CHA2DS2VASc = 5-->No OAC 2/2 h/o SAH.  Marland Kitchen. Retina disorder    hx of torn retina  . Stroke (HCC) 2002   LLE "just a little bit weaker" (09/17/2012)  . Subarachnoid hemorrhage (HCC) 08/2009   Past Surgical History:  Procedure Laterality Date  . BLEPHAROPLASTY  1990's   "right eye" (09/16/2012)  . CARDIOVASCULAR STRESS TEST     nl perfusion w/o ischemia, EF 56%, bradycardia without syncope 04/15/10 (Dr. Harold HedgeKenneth Fath)  . CATARACT EXTRACTION W/ INTRAOCULAR LENS IMPLANT  1980's   "right" (09/17/2012)  . COLONOSCOPY     06/26/09  . EYE MUSCLE SURGERY  1980's   "right eye" (09/17/2012)  . INGUINAL HERNIA REPAIR  04/10/2011   "right" (09/17/2012)  . JOINT REPLACEMENT    . POSTERIOR LUMBAR FUSION  09/16/2012   "L4-5" (09/17/2012)  . RETINAL DETACHMENT SURGERY  1980's   "right" (09/17/2012)  . TOTAL KNEE ARTHROPLASTY  04/28/2010   "left" (09/17/2012)    Allergies  Allergies  Allergen Reactions  . Codeine Nausea Only and Other (See Comments)  . Fentanyl Other (See Comments)    Given before knee surgery, blood pressure became extremely low  . Regadenoson Other (See Comments)    Created cardiac arrest    History of Present Illness    78 year old male with the above complex past medical history including hypertension, hyperlipidemia, prior stroke, subarachnoid hemorrhage (2010), seizures, gait instability and frequent falls, and recent diagnosis of permanent atrial fibrillation.  In May 2020, he had multiple ER visits in the setting of weakness and falls.  He was found to have a large tear of the left superior labrum and MRI of the spine showed a large L2-3 disc extrusion.  During 1 of his admissions in May, he was found to be in atrial fibrillation with rapid ventricular response.  Echocardiogram showed normal LV function with moderate pulmonary hypertension, mild to moderate mitral regurgitation, and mild aortic insufficiency.  He was initially on verapamil therapy for rate control however, this  was discontinued in the setting of pauses up to 2.7 seconds at night.  Follow-up Zio patch showed chronic A. fib with an average heart rate of 70 bpm.  Of note, he has not been on anticoagulation in the setting of prior subarachnoid hemorrhage in 2010.  He is scheduled to see neurology at the end of this month to determine whether or not it would be safe to initiate oral anticoagulation.  He was seen in clinic on August 26 with a 3-week history of increasing lower extremity edema, to which he thought amlodipine might be  contributing.  Amlodipine was discontinued and Lasix was increased to 40 mg daily.  Follow-up labs last week showed stable renal function and potassium.  Since his last visit lower extremity swelling has completely resolved.  He has continued to work with physical therapy and thinks his leg strength is improving.  He just bought an exercise bike and is looking forward to improving leg strength further as he cannot ambulate for long periods of time at this point.  He is also noted improvement in blood pressure, which is typically running about 130/75.  He has not had any dyspnea, chest pain, palpitations, PND, orthopnea, dizziness, syncope, or early satiety.    Home Medications    Prior to Admission medications   Medication Sig Start Date End Date Taking? Authorizing Provider  aliskiren (TEKTURNA) 300 MG tablet Take 300 mg by mouth daily.     [provider]  amoxicillin (AMOXIL) 500 MG tablet Take 500 mg by mouth as directed. Dental procedure only    [provider]  aspirin 325 MG tablet Take 325 mg by mouth daily.    [provider]  carbamazepine (TEGRETOL) 200 MG tablet Take 200 mg by mouth 2 (two) times a day.     [provider]  cetirizine (ZYRTEC) 10 MG tablet Take 10 mg by mouth daily as needed for allergies.     [provider]  doxazosin (CARDURA) 1 MG tablet Take 1 mg by mouth at bedtime.     [provider]  furosemide (LASIX) 20 MG tablet Take 2 tablets (40 mg total) by mouth daily. 05/27/19   Creig HinesBerge, Torie Towle Ronald, NP  irbesartan (AVAPRO) 300 MG tablet Take 300 mg by mouth daily. 11/19/17   [provider]  Omega-3 Fatty Acids (FISH OIL) 1200 MG CAPS Take 1,200 mg by mouth daily.    [provider]  pantoprazole (PROTONIX) 40 MG tablet Take 40 mg by mouth at bedtime.     [provider]  simvastatin (ZOCOR) 20 MG tablet Take 20 mg by mouth 2 (two) times daily.     [provider]  vitamin B-12  (CYANOCOBALAMIN) 1000 MCG tablet Take 1,000 mcg by mouth daily.    [provider]    Review of Systems    Lower extremity swelling has resolved off of amlodipine and with a slightly higher dose of Lasix.  He denies chest pain, palpitations, dyspnea, pnd, orthopnea, n, v, dizziness, syncope, weight gain, or early satiety.  All other systems reviewed and are otherwise negative except as noted above.  Physical Exam    VS:  BP 138/68 (BP Location: Left Arm, Patient Position: Sitting, Cuff Size: Normal)   Pulse 86   Ht 6' (1.829 m)   Wt 216 lb (98 kg)   SpO2 97%   BMI 29.29 kg/m  , BMI Body mass index is 29.29 kg/m. GEN: Well nourished, well developed, in no acute distress.  HEENT: normal. Neck: Supple, no JVD, carotid bruits, or masses. Cardiac: IR, IR, 2/6 syst murmur @ LLSB, no rubs, or gallops. No clubbing, cyanosis, edema.  Radials/PT 2+ and equal bilaterally.  Respiratory:  Respirations regular and unlabored, clear to auscultation bilaterally. GI: Soft, nontender, nondistended, BS + x 4. MS: no deformity or atrophy. Skin: warm and dry, no rash. Neuro:  Strength and sensation are intact. Psych: Normal affect.  Accessory Clinical Findings    ECG personally reviewed by me today -atrial fibrillation, 86, PVC, left axis deviation, left anterior fascicular block, LVH, poor R wave progression - no acute changes.  Lab Results  Component Value Date   WBC 9.9 02/18/2019   HGB 13.6 02/18/2019   HCT 41.1 02/18/2019   MCV 97.9 02/18/2019   PLT 192 02/18/2019   Lab Results  Component Value Date   CREATININE 1.01 05/28/2019   BUN 15 05/28/2019   NA 138 05/28/2019   K 4.3 05/28/2019   CL 100 05/28/2019   CO2 29 05/28/2019   Lab Results  Component Value Date   ALT 17 01/30/2019   AST 21 01/30/2019   ALKPHOS 82 01/30/2019   BILITOT 0.8 01/30/2019    Assessment & Plan    1.  Lower extremity edema/acute on chronic diastolic congestive heart failure/pulmonary arterial  hypertension: Recent echo in May of this year showed normal LV function.  He was noted to have pulmonary hypertension as well as mild to moderate mitral regurgitation.  At his last visit, he had increasing lower extremity swelling over 3-week period and I discontinued his amlodipine and increase Lasix to 40 mg daily.  Follow-up basic metabolic panel on September 2 showed stable renal function and normal potassium.  With these medication changes, he has had complete resolution of lower extremity swelling.  He notes a chronic issue of what sounds like venous stasis and we discussed other ways to prevent swelling such as limiting sodium intake, keeping legs elevated, and also wearing compression socks.  He will continue to watch his swelling closely and contact us if this worsens again.  2.  Permanent atrial fibrillation: Diagnosed in May of this year.  Heart rates at home trended from 60s to 80s and he is 85 today.  Previously on verapamil but discontinued in the setting of slow A. fib earlier this year.  He is not currently on an AV nodal blocking agent.  He is not currently anticoagulated in the setting of prior spontaneous subarachnoid hemorrhage when on Plavix therapy in 2010, though he has tolerated aspirin 325 mg daily for several years.  He has follow-up with neurology later this month in order to assess whether or not it would be safe to switch him from aspirin to Eliquis in the setting of A. fib.  3.  Essential hypertension: Blood pressure improved today at 138/68.  He says he is typically about 130/75 at home.  Continue current regimen including Tekturna, Lasix, and Avapro.  He is also on doxazosin for the treatment of BPH.  Could consider further titration of doxazosin if necessary.  4.  Hyperlipidemia: Managed by primary care.  LDL was 100 earlier this month and he remains on simvastatin therapy.  5.  Subarachnoid hemorrhage: This occurred spontaneously in the setting of Plavix therapy in 2010.  He  has follow-up with neurology later this month as outlined above.  6.  Mitral regurgitation: Mild to moderate by recent echo.  7.  Disposition: Follow-up in clinic in approximately 2 months or sooner if  necessary.  Murray Hodgkins, NP 06/04/2019, 11:30 AM

## 2019-06-04 NOTE — Patient Instructions (Signed)
Medication Instructions:  Your physician recommends that you continue on your current medications as directed. Please refer to the Current Medication list given to you today.  If you need a refill on your cardiac medications before your next appointment, please call your pharmacy.   Lab work: None ordered If you have labs (blood work) drawn today and your tests are completely normal, you will receive your results only by: Marland Kitchen MyChart Message (if you have MyChart) OR . A paper copy in the mail If you have any lab test that is abnormal or we need to change your treatment, we will call you to review the results.  Testing/Procedures: None ordered  Follow-Up: At [facility], you and your health needs are our priority.  As part of our continuing mission to provide you with exceptional heart care, we have created designated Provider Care Teams.  These Care Teams include your primary Cardiologist (physician) and Advanced Practice Providers (APPs -  Physician Assistants and Nurse Practitioners) who all work together to provide you with the care you need, when you need it. You will need a follow up appointment in 2 months.  Please call our office 2 months in advance to schedule this appointment.  You may see Kathlyn Sacramento, MD or one of the following Advanced Practice Providers on your designated Care Team:   Murray Hodgkins, NP Christell Faith, PA-C . Marrianne Mood, PA-C  Any Other Special Instructions Will Be Listed Below (If Applicable). N/A

## 2019-06-23 NOTE — Progress Notes (Signed)
NEUROLOGY CONSULTATION NOTE  [name] MRN: [id number] DOB: Jul 02, 1941  Referring provider: Lorine BearsMuhammad Arida, MD Primary care provider: Jerl MinaJames Hedrick, MD  Reason for consult:  History of subarachnoid hemorrhage  HISTORY OF PRESENT ILLNESS: [name] is a 78 year old right-handed white male with atrial fibrillation, hypertension, chronic low back pain, peripheral neuropathy, mitral regurgitation, seizure disorder and history of CVA and SAH who presents for history of subarachnoid hemorrhage.  History supplemented by hospital, prior neurology and referring provider notes.  In 2002, he had a CVA affecting his left side.  He was started on Plavix.  In December 2010, he became confused with slurred speech.  No head trauma or fall.  CT of head from 09/14/2009 showed small subarachnoid hemorrhage in the left parafalcine region with mild surrounding white matter edema in the left frontal lobe but no mass effect.  Plavix was discontinued.  At a later time, he was started on ASA.   Since the CVA, he has had gradual progression of balance problems.    He has history of left knee replacement in 2011.  He was evaluated by a neurologist in [place] in 2015 who believed his ataxia was secondary to peripheral neuropathy and complications of his prior CVA.  NCV-EMG reportedly showed evidence of neuropathy.  TSH was normal.  Several years ago, he did have low B12 in the 230s.  Hgb A1c was 6.1.  MRI of brain without contrast on 11/23/15 was personally reviewed and showed global atrophy, moderate chronic small vessel ischemic changes, and old infarcts in the basal ganglia bilaterally, thalami bilaterally, corona radiata bilaterally and left cerebellar hemisphere.  He has chronic low back pain with lumbar arthritis with L4-L5 disc protrusion status post lumbar laminectomy in 2013.  More recently, he developed left leg pain.  MRI of lumbar spine from 01/30/2019 showed large L2-3 disc extrusion causing severe  left lateral recess stenosis with left L3 nerve root impingement as well as mild to moderate spinal stenosis and moderate to severe left neural foraminal stenosis at L3-4.  He also has history of chronic neck pain with cervical spondylosis.  He was diagnosed with new-onset atrial fibrillation in May 2020.  Due to past SAH, patient declined anticoagulation.    On 02/17/2019, he woke up in the middle of the night and was shaking all over.  He couldn't sit up or stand up.  He was awake and alert.  EMS came and took him to the hospital.  He was found to have a fib.  Since then, his balance is even worse.  His cardiologist wanted to start anticoagulation, but [facility].    Of note, he had an isolated unprovoked seizure in 1987.  He has been on Tegretol ever since.   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 Jerl MinaJames Hedrick, [facility] [place]   Intracranial bleed [facility](HCC)    december 2010   Kidney stones    "  passed on their own" (09/17/2012)   Migraines    "2 in my life" (09/17/2012)   Mitral regurgitation    a. 01/2019 Echo: EF 55-60%, mild conc LVH. RVSP 59.22mmHg. Mildly dil LA. Mild to mod MR. Mild AI.   PAH (pulmonary artery hypertension) (HCC)    Permanent atrial fibrillation    a. CHA2DS2VASc = 5-->No OAC 2/2 h/o SAH.   Retina disorder    hx of torn retina   Stroke ([place]) 2002   LLE "just a little bit weaker" (09/17/2012)   Subarachnoid hemorrhage ([place]) 08/2009    PAST SURGICAL  HISTORY: Past Surgical History:  Procedure Laterality Date   BLEPHAROPLASTY  1990's   "right eye" (09/16/2012)   CARDIOVASCULAR STRESS TEST     nl perfusion w/o ischemia, EF 56%, bradycardia without syncope 04/15/10 (Dr. Bartholome Bill)   [place]  1980's   "right" (09/17/2012)   COLONOSCOPY     06/26/09   EYE MUSCLE SURGERY  1980's   "right eye" (09/17/2012)   INGUINAL HERNIA REPAIR  04/10/2011   "right" (09/17/2012)   JOINT REPLACEMENT     POSTERIOR LUMBAR FUSION  09/16/2012   "L4-5" (09/17/2012)   RETINAL DETACHMENT SURGERY  1980's   "right" (09/17/2012)   TOTAL KNEE ARTHROPLASTY  04/28/2010   "left" (09/17/2012)    MEDICATIONS: Current Outpatient Medications on File Prior to Visit  Medication Sig Dispense Refill   aliskiren (TEKTURNA) 300 MG tablet Take 300 mg by mouth daily.      amoxicillin (AMOXIL) 500 MG tablet Take 500 mg by mouth as directed. Dental procedure only     aspirin 325 MG tablet Take 325 mg by mouth daily.     carbamazepine (TEGRETOL) 200 MG tablet Take 200 mg by mouth 2 (two) times a day.      cetirizine (ZYRTEC) 10 MG tablet Take 10 mg by mouth daily as needed for allergies.      doxazosin (CARDURA) 1 MG tablet Take 1 mg by mouth at bedtime.      furosemide (LASIX) 20 MG tablet Take 2 tablets (40 mg total) by mouth daily. 180 tablet 3   irbesartan (AVAPRO) 300 MG tablet Take 300 mg by mouth daily.     Omega-3 Fatty Acids (FISH OIL) 1200 MG CAPS Take 1,200 mg by mouth daily.     pantoprazole (PROTONIX) 40 MG tablet Take 40 mg by mouth at bedtime.      simvastatin (ZOCOR) 20 MG tablet Take 20 mg by mouth 2 (two) times daily.      vitamin B-12 (CYANOCOBALAMIN) 1000 MCG tablet Take 1,000 mcg by mouth daily.     No current facility-administered medications on file prior to visit.     ALLERGIES: Allergies  Allergen Reactions   Codeine Nausea Only and Other (See Comments)   Fentanyl Other (See  Comments)    Given before knee surgery, blood pressure became extremely low   Regadenoson Other (See Comments)    Created cardiac arrest    FAMILY HISTORY: Family History  Problem Relation Age of Onset   Heart disease Mother    SOCIAL HISTORY: Social History   Socioeconomic History   Marital status: Married    Spouse name: Not on file   Number of children: Not on file   Years of education: Not on file   Highest education level: Not on file  Occupational History   Not on file  Social Needs   Financial resource strain: Not on file  Food insecurity    Worry: Not on file    Inability: Not on file   Transportation needs    Medical: Not on file    Non-medical: Not on file  Tobacco Use   Smoking status: Never Smoker   Smokeless tobacco: Never Used  Substance and Sexual Activity   Alcohol use: No   Drug use: No   Sexual activity: Not Currently  Lifestyle   Physical activity    Days per week: Not on file    Minutes per session: Not on file   Stress: Not on file  Relationships   Social connections    Talks on phone: Not on file    Gets together: Not on file    Attends religious service: Not on file    Active member of club or organization: Not on file    Attends meetings of clubs or organizations: Not on file    Relationship status: Not on file   Intimate partner violence    Fear of current or ex partner: Not on file    Emotionally abused: Not on file    Physically abused: Not on file    Forced sexual activity: Not on file  Other Topics Concern   Not on file  Social History Narrative   Not on file    REVIEW OF SYSTEMS: Constitutional: No fevers, chills, or sweats, no generalized fatigue, change in appetite Eyes: No visual changes, double vision, eye pain Ear, nose and throat: No hearing loss, ear pain, nasal congestion, sore throat Cardiovascular: No chest pain, palpitations Respiratory:  No shortness of breath at rest or with exertion,  wheezes GastrointestinaI: No nausea, vomiting, diarrhea, abdominal pain, fecal incontinence Genitourinary:  No dysuria, urinary retention or frequency Musculoskeletal:  No neck pain, back pain Integumentary: No rash, pruritus, skin lesions Neurological: as above Psychiatric: No depression, insomnia, anxiety Endocrine: No palpitations, fatigue, diaphoresis, mood swings, change in appetite, change in weight, increased thirst Hematologic/Lymphatic:  No purpura, petechiae. Allergic/Immunologic: no itchy/runny eyes, nasal congestion, recent allergic reactions, rashes  PHYSICAL EXAM: Blood pressure (!) 167/95, pulse 60, temperature 97.9 F (36.6 C), resp. rate 14, height 6' (1.829 m), weight 215 lb (97.5 kg), SpO2 96 %. General: No acute distress.  Patient appears well-groomed.  Head:  Normocephalic/atraumatic Eyes:  fundi examined but not visualized Neck: supple, no paraspinal tenderness, full range of motion Back: No paraspinal tenderness Heart: regular rate and rhythm Lungs: Clear to auscultation bilaterally. Vascular: No carotid bruits. Neurological Exam: Mental status: alert and oriented to person, place, and time, recent and remote memory intact, fund of knowledge intact, attention and concentration intact, speech fluent and not dysarthric, language intact. Cranial nerves: CN I: not tested CN II: pupils equal, round and reactive to light, visual fields intact CN III, IV, VI:  full range of motion, no nystagmus, no ptosis CN V: facial sensation intact CN VII: upper and lower face symmetric CN VIII: hearing intact CN IX, X: gag intact, uvula midline CN XI: sternocleidomastoid and trapezius muscles intact CN XII: tongue midline Bulk & Tone: normal, no fasciculations. Motor:  5-/5 hip flexion and bilateral foot drops.  Otherwise, 5/5 Sensation:  Pinprick sensation reduced in left hand and left foot greater than right foot; vibration sensation reduced in left foot. Deep Tendon  Reflexes:  absent throughout, toes downgoing.  Finger to nose testing:  Without dysmetria.   Heel to shin:  Without dysmetria.   Gait:  Wide-based steppage gait.    IMPRESSION: 1.  History of  spontaneous subarachnoid hemorrhage 2.  New onset atrial fibrillation   He has not had many falls.  I discussed that starting anticoagulation increases risk of hemorrhage however, we must weigh risks and benefits.  Atrial fibrillation increases risk for ischemic stroke.  His SAH wasn't large and it occurred 10 years ago.  MRI of brain from 2017 showed no evidence of multiple chronic microhemorrhages to suggest cerebral amyloid angiopathy. He is a fall risk but he appears to be careful and uses his walker at all times.  We will check CTA of head to evaluate for any vascular abnormality (such as aneurysm).  If it is unremarkable, then I would consider anticoagulation.    3.  Peripheral neuropathy, which may be progressive 4.  Isolated seizure, stable  PLAN: 1.  CTA of head 2.  Check secondary causes for neuropathy:  ANA, sed rate, B12, SPEP/IFE, SSA/SSB antibodies 3.  Follow up after testing.   Thank you for allowing me to take part in the care of this patient.  Shon Millet, DO  CC:  Lorine Bears, MD  Jerl Mina, MD

## 2019-06-24 ENCOUNTER — Ambulatory Visit: Payer: Medicare Other | Admitting: Neurology

## 2019-06-24 ENCOUNTER — Encounter: Payer: Self-pay | Admitting: Neurology

## 2019-06-24 ENCOUNTER — Other Ambulatory Visit: Payer: Self-pay

## 2019-06-24 ENCOUNTER — Other Ambulatory Visit: Payer: Self-pay | Admitting: *Deleted

## 2019-06-24 ENCOUNTER — Other Ambulatory Visit (INDEPENDENT_AMBULATORY_CARE_PROVIDER_SITE_OTHER): Payer: Medicare Other

## 2019-06-24 VITALS — BP 167/95 | HR 60 | Temp 97.9°F | Resp 14 | Ht 72.0 in | Wt 215.0 lb

## 2019-06-24 DIAGNOSIS — I609 Nontraumatic subarachnoid hemorrhage, unspecified: Secondary | ICD-10-CM

## 2019-06-24 DIAGNOSIS — I4821 Permanent atrial fibrillation: Secondary | ICD-10-CM

## 2019-06-24 DIAGNOSIS — G6289 Other specified polyneuropathies: Secondary | ICD-10-CM

## 2019-06-24 NOTE — Patient Instructions (Signed)
Will check CTA of head Will check ANA, sed rate, B12, SPEP/IFE, SSA/SSB antibodies Follow up after testing.

## 2019-06-25 LAB — SJOGRENS SYNDROME-A EXTRACTABLE NUCLEAR ANTIBODY: SSA (Ro) (ENA) Antibody, IgG: <1 AI

## 2019-06-26 LAB — PROTEIN ELECTROPHORESIS, SERUM
Albumin ELP: 4 g/dL (ref 3.8–4.8)
Alpha 1: 0.3 g/dL (ref 0.2–0.3)
Alpha 2: 0.7 g/dL (ref 0.5–0.9)
Beta 2: 0.3 g/dL (ref 0.2–0.5)
Beta Globulin: 0.4 g/dL (ref 0.4–0.6)
Gamma Globulin: 0.8 g/dL (ref 0.8–1.7)
Total Protein: 6.5 g/dL (ref 6.1–8.1)

## 2019-06-26 LAB — VITAMIN B12: Vitamin B-12: >2000 pg/mL — ABNORMAL HIGH (ref 200–1100)

## 2019-06-26 LAB — PE AND FLC, SERUM
A/G Ratio: 1.5 (ref 0.7–1.7)
Albumin ELP: 4 g/dL (ref 2.9–4.4)
Alpha 1: 0.2 g/dL (ref 0.0–0.4)
Alpha 2: 0.8 g/dL (ref 0.4–1.0)
Beta: 0.8 g/dL (ref 0.7–1.3)
Gamma Globulin: 0.8 g/dL (ref 0.4–1.8)
Globulin, Total: 2.6 g/dL (ref 2.2–3.9)
Ig Kappa Free Light Chain: 18.9 mg/L (ref 3.3–19.4)
Ig Lambda Free Light Chain: 24.9 mg/L (ref 5.7–26.3)
Kappa/Lambda FluidC Ratio: 0.76 (ref 0.26–1.65)
Total Protein: 6.6 g/dL (ref 6.0–8.5)

## 2019-06-26 LAB — SJOGRENS SYNDROME-B EXTRACTABLE NUCLEAR ANTIBODY: SSB (La) (ENA) Antibody, IgG: <1 AI

## 2019-06-26 LAB — ANA: Anti Nuclear Antibody (ANA): NEGATIVE

## 2019-06-26 LAB — SEDIMENTATION RATE: Sed Rate: 6 mm/h (ref 0–20)

## 2019-06-27 ENCOUNTER — Telehealth: Payer: Self-pay | Admitting: *Deleted

## 2019-06-27 NOTE — Telephone Encounter (Addendum)
  Spoke to patient and informed him labs reveal no specific cause for neuropathy and findings are unremarkable. Verbalized understanding.  ----- Message from Pieter Partridge, DO sent at 06/27/2019 12:56 PM EDT ----- Labs reveal no specific cause for neuropathy.  Findings are unremarkable.

## 2019-07-29 ENCOUNTER — Other Ambulatory Visit: Payer: Self-pay | Admitting: Family Medicine

## 2019-07-29 DIAGNOSIS — R413 Other amnesia: Secondary | ICD-10-CM

## 2019-08-05 ENCOUNTER — Other Ambulatory Visit: Payer: Self-pay

## 2019-08-05 ENCOUNTER — Encounter: Payer: Self-pay | Admitting: Cardiovascular Disease

## 2019-08-05 ENCOUNTER — Ambulatory Visit (INDEPENDENT_AMBULATORY_CARE_PROVIDER_SITE_OTHER): Payer: Medicare Other | Admitting: Cardiovascular Disease

## 2019-08-05 VITALS — BP 118/80 | HR 73 | Ht 72.0 in | Wt 219.8 lb

## 2019-08-05 DIAGNOSIS — I4821 Permanent atrial fibrillation: Secondary | ICD-10-CM

## 2019-08-05 DIAGNOSIS — I5032 Chronic diastolic (congestive) heart failure: Secondary | ICD-10-CM

## 2019-08-05 DIAGNOSIS — I340 Nonrheumatic mitral (valve) insufficiency: Secondary | ICD-10-CM | POA: Diagnosis not present

## 2019-08-05 DIAGNOSIS — I10 Essential (primary) hypertension: Secondary | ICD-10-CM

## 2019-08-05 MED ORDER — ASPIRIN EC 81 MG PO TBEC: 81.0000 mg | DELAYED_RELEASE_TABLET | Freq: Every day | ORAL | Status: DC

## 2019-08-05 NOTE — Patient Instructions (Signed)
Medication Instructions:  Your physician has recommended you make the following change in your medication:   DECREASE Aspirin to 81mg  daily.  *If you need a refill on your cardiac medications before your next appointment, please call your pharmacy*  Lab Work: None ordered If you have labs (blood work) drawn today and your tests are completely normal, you will receive your results only by: Marland Kitchen MyChart Message (if you have MyChart) OR . A paper copy in the mail If you have any lab test that is abnormal or we need to change your treatment, we will call you to review the results.  Testing/Procedures: None ordered  Follow-Up: At [facility], you and your health needs are our priority.  As part of our continuing mission to provide you with exceptional heart care, we have created designated Provider Care Teams.  These Care Teams include your primary Cardiologist (physician) and Advanced Practice Providers (APPs -  Physician Assistants and Nurse Practitioners) who all work together to provide you with the care you need, when you need it.  Your next appointment:   6 months  The format for your next appointment:   In Person  Provider:    You may see Kathlyn Sacramento, MD or one of the following Advanced Practice Providers on your designated Care Team:    Murray Hodgkins, NP  Christell Faith, PA-C  Marrianne Mood, PA-C   Other Instructions N/A

## 2019-08-05 NOTE — Progress Notes (Signed)
Cardiology Office Note   Date:  08/05/2019   ID:  Zachary Deforestlwen E Pluta, DOB 04-25-1941, MRN [id number]  PCP:  Jerl MinaHedrick, James, MD  Cardiologist:   Lorine BearsMuhammad Arida, MD   Chief Complaint  Patient presents with  . other    2 month follow up. Meds reviewed by the pt. verbally. Pt. c/o LE edema.       History of Present Illness: [name] is a 78 y.o. male who presents for a follow-up visit regarding chronic atrial fibrillation.   He has history of small subarachnoid hemorrhage in 2010 in the setting of treatment with clopidogrel, previous stroke in December 2013, seizure disorder, hypertension, hyperlipidemia, and multiple falls. The patient had multiple ED visits in May with weakness and falls.  He was found to have a large tear of the left superior labrum.  MRI of the spine showed large L2-L3 disc extrusion. During 1 of his admissions in May, he was found to be in atrial fibrillation with RVR.  Echo showed an EF of 55 to 60%, moderate pulmonary hypertension, mild to moderate mitral regurgitation and mild aortic insufficiency.  He did have episodes of bradycardia with pauses up to 2.7 seconds at night.  He was not placed on anticoagulation given prior subarachnoid hemorrhage.  Verapamil was discontinued.  He had a ZIO Patch monitor which showed chronic atrial fibrillation with an average heart rate of 78 bpm.  Lowest heart rate was 46 bpm.  Occasional PVCs with an overall burden of 1.4%.  The patient has not been anticoagulated due to previous history of subarachnoid hemorrhage and also frequent falls.  He was seen by neurology and obviously there is no absolute contraindication for anticoagulation but we have to balance risks and benefits.  He has been doing reasonably well.  He was seen by Thayer Ohmhris recently for increased leg edema.  Amlodipine was discontinued and furosemide was increased for a few days.  The swelling resolved since then.    Past Medical History:  Diagnosis Date  .  Arthritis    "lower back on down; into all joints; into my feet" (09/17/2012)  . Chronic low back pain    hx of  . Complication of anesthesia    "cardiac arrest with dye from lexiscan (04/15/10 records indicate bradycardia, no ischemia, EF 56%), unstable BP with anesthesia)  . GERD (gastroesophageal reflux disease)   . 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 Herniated disc, cervical   . Hypercholesteremia   . Hypertension    sees Dr. Jerl MinaJames Hedrick, [facility] [place]  . Intracranial bleed [facility](HCC)    december 2010  . Kidney stones    "passed on their own" (09/17/2012)  . Migraines    "2 in my life" (09/17/2012)  . Mitral regurgitation    a. 01/2019 Echo: EF 55-60%, mild conc LVH. RVSP 59.365mmHg. Mildly dil LA. Mild to mod MR. Mild AI.  Marland Kitchen. PAH (pulmonary artery hypertension) (HCC)   . Permanent atrial fibrillation (HCC)    a. CHA2DS2VASc = 5-->No OAC 2/2 h/o SAH.  Marland Kitchen. Retina disorder    hx of torn retina  . Stroke (HCC) 2002   LLE "just a little bit weaker" (09/17/2012)  . Subarachnoid hemorrhage (HCC) 08/2009    Past Surgical History:  Procedure Laterality Date  . BLEPHAROPLASTY  1990's   "right eye" (09/16/2012)  . CARDIOVASCULAR STRESS TEST     nl perfusion w/o ischemia, EF 56%, bradycardia without syncope 04/15/10 (Dr. Iantha FallenKenneth  Fath)  . CATARACT EXTRACTION W/ INTRAOCULAR LENS IMPLANT  1980's   "right" (09/17/2012)  . COLONOSCOPY     06/26/09  . EYE MUSCLE SURGERY  1980's   "right eye" (09/17/2012)  . INGUINAL HERNIA REPAIR  04/10/2011   "right" (09/17/2012)  . JOINT REPLACEMENT    . POSTERIOR LUMBAR FUSION  09/16/2012   "L4-5" (09/17/2012)  . RETINAL DETACHMENT SURGERY  1980's   "right" (09/17/2012)  . TOTAL KNEE ARTHROPLASTY  04/28/2010   "left" (09/17/2012)     Current Outpatient Medications  Medication Sig Dispense Refill  . aliskiren (TEKTURNA) 300 MG tablet Take 300 mg by mouth daily.     . carbamazepine (TEGRETOL) 200 MG tablet Take 200 mg by  mouth 2 (two) times a day.     . cetirizine (ZYRTEC) 10 MG tablet Take 10 mg by mouth daily as needed for allergies.     Marland Kitchen doxazosin (CARDURA) 1 MG tablet Take 1 mg by mouth at bedtime.     . furosemide (LASIX) 20 MG tablet Take 2 tablets (40 mg total) by mouth daily. 180 tablet 3  . irbesartan (AVAPRO) 300 MG tablet Take 300 mg by mouth daily.    . naproxen (NAPROSYN) 500 MG tablet Take by mouth.    . Omega-3 Fatty Acids (FISH OIL) 1200 MG CAPS Take 1,200 mg by mouth daily.    . pantoprazole (PROTONIX) 40 MG tablet Take 40 mg by mouth at bedtime.     . simvastatin (ZOCOR) 20 MG tablet Take 20 mg by mouth 2 (two) times daily.     . vitamin B-12 (CYANOCOBALAMIN) 1000 MCG tablet Take 1,000 mcg by mouth daily.    Marland Kitchen amoxicillin (AMOXIL) 500 MG tablet Take 500 mg by mouth as directed. Dental procedure only    . aspirin EC 81 MG tablet Take 1 tablet (81 mg total) by mouth daily.     No current facility-administered medications for this visit.     Allergies:   Codeine, Fentanyl, and Regadenoson    Social History:  The patient  reports that he has never smoked. He has never used smokeless tobacco. He reports that he does not drink alcohol or use drugs.   Family History:  The patient's family history includes Heart disease in his mother.    ROS:  Please see the history of present illness.   Otherwise, review of systems are positive for none.   All other systems are reviewed and negative.    PHYSICAL EXAM: VS:  BP 118/80 (BP Location: Left Arm, Patient Position: Sitting, Cuff Size: Normal)   Pulse 73   Ht 6' (1.829 m)   Wt 219 lb 12 oz (99.7 kg)   BMI 29.80 kg/m  , BMI Body mass index is 29.8 kg/m. GEN: Well nourished, well developed, in no acute distress  HEENT: normal  Neck: no JVD, carotid bruits, or masses Cardiac: Irregularly irregular; no murmurs, rubs, or gallops,no edema  Respiratory:  clear to auscultation bilaterally, normal work of breathing GI: soft, nontender, nondistended,  + BS MS: no deformity or atrophy  Skin: warm and dry, no rash Neuro:  Strength and sensation are intact Psych: euthymic mood, full affect   EKG:  EKG is ordered today. The ekg ordered today demonstrates atrial fibrillation with RVR, minimal LVH with old septal infarct.   Recent Labs: 01/30/2019: ALT 17 02/17/2019: TSH 0.997 02/18/2019: Hemoglobin 13.6; Magnesium 2.2; Platelets 192 05/28/2019: BUN 15; Creatinine, Ser 1.01; Potassium 4.3; Sodium 138    Lipid Panel  No results found for: CHOL, TRIG, HDL, CHOLHDL, VLDL, LDLCALC, LDLDIRECT    Wt Readings from Last 3 Encounters:  08/05/19 219 lb 12 oz (99.7 kg)  06/24/19 215 lb (97.5 kg)  06/04/19 216 lb (98 kg)       PAD Screen 02/25/2019  Previous PAD dx? No  Previous surgical procedure? No  Pain with walking? Yes  Subsides with rest? Yes  Feet/toe relief with dangling? No  Painful, non-healing ulcers? No  Extremities discolored? No      ASSESSMENT AND PLAN:  1. Chronic Afib: He remains in Afib with controlled ventricular response. No longer on verapamil secondary to bradycardic heart rates noted while admitted. Continue to avoid AV nodal blocking agents at this time. CHADS2VASc 5 (HTN, age x 2, stroke x 2). We again discussed risk benefit ratio of anticoagulation and it would be reasonable to start anticoagulation.  However, unfortunately, he is on carbamazepine which interacts with all NOACs and the combination should be avoided.  He reports that he takes carbamazepine for seizures and has been on this medication for many years and it controlled his symptoms.  Unless carbamazepine or switch to something else, not able to prescribe Eliquis or Xarelto.  Most other antiseizure medications also interact with NOACs. I explained to him that alternative is warfarin but there is higher risk of intracranial bleeding with warfarin and the patient would like to avoid that.  I think it is reasonable for now to continue with aspirin daily but  decrease the dose to 81 mg.  2.  Essential hypertension: Blood pressure is controlled on current medications.  3.  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  Leg swelling resolved after stopping amlodipine.  4. Mitral regurgitation: Mild to moderate on recent echo. Continue to monitor.    Disposition:   FU with me in 6 months  Signed,  Lorine Bears, MD  08/05/2019 2:31 PM    [facility]

## 2019-08-06 ENCOUNTER — Ambulatory Visit
Admission: RE | Admit: 2019-08-06 | Discharge: 2019-08-06 | Disposition: A | Discharge status: Home / Self Care | Payer: Medicare Other | Source: Ambulatory Visit | Attending: Family Medicine | Admitting: Family Medicine

## 2019-08-06 DIAGNOSIS — R413 Other amnesia: Secondary | ICD-10-CM | POA: Diagnosis present

## 2019-09-26 DIAGNOSIS — I82402 Acute embolism and thrombosis of unspecified deep veins of left lower extremity: Secondary | ICD-10-CM

## 2019-09-26 HISTORY — DX: Acute embolism and thrombosis of unspecified deep veins of left lower extremity: I82.402

## 2019-10-06 ENCOUNTER — Other Ambulatory Visit: Payer: Self-pay | Admitting: Family Medicine

## 2019-10-06 ENCOUNTER — Ambulatory Visit
Admission: RE | Admit: 2019-10-06 | Discharge: 2019-10-06 | Disposition: A | Discharge status: Home / Self Care | Payer: Medicare Other | Source: Ambulatory Visit | Attending: Family Medicine | Admitting: Family Medicine

## 2019-10-06 ENCOUNTER — Other Ambulatory Visit: Payer: Self-pay

## 2019-10-06 DIAGNOSIS — R600 Localized edema: Secondary | ICD-10-CM

## 2019-10-07 ENCOUNTER — Telehealth: Payer: Self-pay | Admitting: Cardiovascular Disease

## 2019-10-07 NOTE — Telephone Encounter (Signed)
Patient was positive for DVT, need clarification on blood thinner. Pt was started on Eliquis and was told to D/C aspirin. Please call to dicuss.

## 2019-10-07 NOTE — Telephone Encounter (Signed)
Patient was last seen on 08/05/19 by Dr. Kirke Corin.  Per Dr. Jari Sportsman documentation.  The patient has not been anticoagulated due to previous history of subarachnoid hemorrhage and also frequent falls.  He was seen by neurology and obviously there is no absolute contraindication for anticoagulation but we have to balance risks and benefits.    Message fwd to Dr. Kirke Corin to review and advise.

## 2019-10-08 NOTE — Telephone Encounter (Signed)
I think this is the reason that I did not anticoagulate him:   "He reports that he takes carbamazepine for seizures and has been on this medication for many years and it controlled his symptoms.  Unless carbamazepine is switched to something else, not able to prescribe Eliquis or Xarelto.  Most other antiseizure medications also interact with NOACs. I explained to him that alternative is warfarin but there is higher risk of intracranial bleeding with warfarin ."  He might need to see neurology to discuss alternative to carbamazepine if he is to stay on Eliquis.  Please forward this note to his primary care physician as well.

## 2019-10-09 NOTE — Telephone Encounter (Signed)
Returned the call to Amy @ [facility] Dr. Webb Silversmith office. [email or web address].

## 2019-10-14 ENCOUNTER — Other Ambulatory Visit (HOSPITAL_COMMUNITY): Payer: Self-pay | Admitting: Neurology

## 2019-10-14 ENCOUNTER — Other Ambulatory Visit: Payer: Self-pay | Admitting: Neurology

## 2019-10-14 DIAGNOSIS — R278 Other lack of coordination: Secondary | ICD-10-CM

## 2019-10-14 NOTE — Telephone Encounter (Signed)
Spoke with Amy @ [facility] Dr. Webb Silversmith office and made her aware of Dr. Jari Sportsman response and recommendation. Amy verbalized understanding.

## 2019-10-21 ENCOUNTER — Ambulatory Visit (INDEPENDENT_AMBULATORY_CARE_PROVIDER_SITE_OTHER): Payer: Medicare Other | Admitting: Vascular Surgery

## 2019-10-21 ENCOUNTER — Other Ambulatory Visit: Payer: Self-pay

## 2019-10-21 ENCOUNTER — Encounter (INDEPENDENT_AMBULATORY_CARE_PROVIDER_SITE_OTHER): Payer: Self-pay | Admitting: Vascular Surgery

## 2019-10-21 ENCOUNTER — Telehealth (INDEPENDENT_AMBULATORY_CARE_PROVIDER_SITE_OTHER): Payer: Self-pay

## 2019-10-21 VITALS — BP 166/75 | HR 86 | Resp 16 | Ht 72.0 in | Wt 223.0 lb

## 2019-10-21 DIAGNOSIS — I4821 Permanent atrial fibrillation: Secondary | ICD-10-CM

## 2019-10-21 DIAGNOSIS — I82412 Acute embolism and thrombosis of left femoral vein: Secondary | ICD-10-CM | POA: Diagnosis not present

## 2019-10-21 DIAGNOSIS — I639 Cerebral infarction, unspecified: Secondary | ICD-10-CM | POA: Insufficient documentation

## 2019-10-21 DIAGNOSIS — I82409 Acute embolism and thrombosis of unspecified deep veins of unspecified lower extremity: Secondary | ICD-10-CM | POA: Insufficient documentation

## 2019-10-21 DIAGNOSIS — M1990 Unspecified osteoarthritis, unspecified site: Secondary | ICD-10-CM | POA: Insufficient documentation

## 2019-10-21 DIAGNOSIS — I10 Essential (primary) hypertension: Secondary | ICD-10-CM | POA: Diagnosis not present

## 2019-10-21 DIAGNOSIS — R569 Unspecified convulsions: Secondary | ICD-10-CM | POA: Insufficient documentation

## 2019-10-21 DIAGNOSIS — E785 Hyperlipidemia, unspecified: Secondary | ICD-10-CM

## 2019-10-21 NOTE — Patient Instructions (Signed)

## 2019-10-21 NOTE — Assessment & Plan Note (Signed)
The patient has an extensive left lower extremity DVT with persistent swelling despite anticoagulation.  We are at about the 2-week mark now.  We discussed that thrombectomy can be performed up to 3 weeks after DVT with good results.  I discussed the risks and benefits of this procedure.  After a long discussion with he and his wife, they would like to have this performed and will be scheduled for the near future at their convenience.

## 2019-10-21 NOTE — Assessment & Plan Note (Signed)
blood pressure control important in reducing the progression of atherosclerotic disease. On appropriate oral medications.  

## 2019-10-21 NOTE — Progress Notes (Signed)
Patient ID: [name], male   DOB: 1941-04-14, 79 y.o.   MRN: [id number]  Chief Complaint  Patient presents with  . New Patient (Initial Visit)    LLE edema    HPI [name] is a 79 y.o. male.  I am asked to see the patient by Dr. Burnett Sheng for evaluation of left lower extremity DVT with pronounced swelling.  His symptoms started about 2 weeks ago and he had a duplex 2 weeks ago which showed an extensive left lower extremity DVT.  He has been appropriately on Eliquis since that time but his swelling has not improved and if anything is a little worse.  He has a previous history of subdural hematoma and there was concern on starting him on Eliquis but he has tolerated it so far.  No chest pain or shortness of breath.  No right leg symptoms.  This is the leg that he had a hip injury last year as well as a knee replacement in the past.  There is no clear inciting event or causative factor that started his DVT symptoms.     Past Medical History:  Diagnosis Date  . Arthritis    "lower back on down; into all joints; into my feet" (2012/10/17)  . Chronic low back pain    hx of  . Complication of anesthesia    "cardiac arrest with dye from lexiscan (04/15/10 records indicate bradycardia, no ischemia, EF 56%), unstable BP with anesthesia)  . GERD (gastroesophageal reflux disease)   . 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 Herniated disc, cervical   . Hypercholesteremia   . Hypertension    sees Dr. Jerl Mina, [facility] in [place]  . Intracranial bleed [facility])    december 2010  . Kidney stones    "passed on their own" (17-Oct-2012)  . Migraines    "2 in my life" (2012-10-17)  . Mitral regurgitation    a. 01/2019 Echo: EF 55-60%, mild conc LVH. RVSP 59.66mmHg. Mildly dil LA. Mild to mod MR. Mild AI.  Marland Kitchen PAH (pulmonary artery hypertension) (HCC)   . Permanent atrial fibrillation (HCC)    a. CHA2DS2VASc = 5-->No OAC 2/2 h/o SAH.  Marland Kitchen Retina disorder    hx of  torn retina  . Stroke (HCC) 2002   LLE "just a little bit weaker" (10/17/2012)  . Subarachnoid hemorrhage (HCC) 08/2009    Past Surgical History:  Procedure Laterality Date  . BLEPHAROPLASTY  1990's   "right eye" (09/16/2012)  . CARDIOVASCULAR STRESS TEST     nl perfusion w/o ischemia, EF 56%, bradycardia without syncope 04/15/10 (Dr. Harold Hedge)  . CATARACT EXTRACTION W/ INTRAOCULAR LENS IMPLANT  1980's   "right" (2012-10-17)  . COLONOSCOPY     06/26/09  . EYE MUSCLE SURGERY  1980's   "right eye" (17-Oct-2012)  . INGUINAL HERNIA REPAIR  04/10/2011   "right" (10/17/2012)  . JOINT REPLACEMENT    . POSTERIOR LUMBAR FUSION  09/16/2012   "L4-5" (2012-10-17)  . RETINAL DETACHMENT SURGERY  1980's   "right" (2012/10/17)  . TOTAL KNEE ARTHROPLASTY  04/28/2010   "left" (10/17/12)     Family History  Problem Relation Age of Onset  . Heart disease Mother   No bleeding disorders, clotting disorders, autoimmune diseases, or aneurysms   Social History   Tobacco Use  . Smoking status: Never Smoker  . Smokeless tobacco: Never Used  Substance Use Topics  . Alcohol use: No  . Drug  use: No     Allergies  Allergen Reactions  . Codeine Nausea Only and Other (See Comments)  . Fentanyl Other (See Comments)    Given before knee surgery, blood pressure became extremely low  . Regadenoson Other (See Comments)    Created cardiac arrest    Current Outpatient Medications  Medication Sig Dispense Refill  . aliskiren (TEKTURNA) 300 MG tablet Take 300 mg by mouth daily.     Marland Kitchen amoxicillin (AMOXIL) 500 MG tablet Take 500 mg by mouth as directed. Dental procedure only    . carbamazepine (TEGRETOL) 200 MG tablet Take 200 mg by mouth 2 (two) times a day.     . cetirizine (ZYRTEC) 10 MG tablet Take 10 mg by mouth daily as needed for allergies.     Marland Kitchen doxazosin (CARDURA) 1 MG tablet Take 1 mg by mouth at bedtime.     . furosemide (LASIX) 20 MG tablet Take 2 tablets (40 mg total) by mouth daily.  180 tablet 3  . irbesartan (AVAPRO) 300 MG tablet Take 300 mg by mouth daily.    . Omega-3 Fatty Acids (FISH OIL) 1200 MG CAPS Take 1,200 mg by mouth daily.    . pantoprazole (PROTONIX) 40 MG tablet Take 40 mg by mouth at bedtime.     . simvastatin (ZOCOR) 20 MG tablet Take 20 mg by mouth 2 (two) times daily.     . vitamin B-12 (CYANOCOBALAMIN) 1000 MCG tablet Take 1,000 mcg by mouth daily.    Marland Kitchen aspirin EC 81 MG tablet Take 1 tablet (81 mg total) by mouth daily. (Patient not taking: Reported on 10/21/2019)    . ELIQUIS 5 MG TABS tablet Take 5 mg by mouth 2 (two) times daily.    . naproxen (NAPROSYN) 500 MG tablet Take by mouth.     No current facility-administered medications for this visit.      REVIEW OF SYSTEMS (Negative unless checked)  Constitutional: [] Weight loss  [] Fever  [] Chills Cardiac: [] Chest pain   [] Chest pressure   [] Palpitations   [] Shortness of breath when laying flat   [] Shortness of breath at rest   [] Shortness of breath with exertion. Vascular:  [] Pain in legs with walking   [] Pain in legs at rest   [] Pain in legs when laying flat   [] Claudication   [] Pain in feet when walking  [] Pain in feet at rest  [] Pain in feet when laying flat   [] History of DVT   [] Phlebitis   [] Swelling in legs   [] Varicose veins   [] Non-healing ulcers Pulmonary:   [] Uses home oxygen   [] Productive cough   [] Hemoptysis   [] Wheeze  [] COPD   [] Asthma Neurologic:  [] Dizziness  [] Blackouts   [] Seizures   [] History of stroke   [] History of TIA  [] Aphasia   [] Temporary blindness   [] Dysphagia   [] Weakness or numbness in arms   [] Weakness or numbness in legs Musculoskeletal:  [] Arthritis   [] Joint swelling   [] Joint pain   [] Low back pain Hematologic:  [] Easy bruising  [] Easy bleeding   [] Hypercoagulable state   [] Anemic  [] Hepatitis Gastrointestinal:  [] Blood in stool   [] Vomiting blood  [] Gastroesophageal reflux/heartburn   [] Abdominal pain Genitourinary:  [] Chronic kidney disease   [] Difficult  urination  [] Frequent urination  [] Burning with urination   [] Hematuria Skin:  [] Rashes   [] Ulcers   [] Wounds Psychological:  [] History of anxiety   []  History of major depression.    Physical Exam BP (!) 166/75 (BP Location: Right Arm)  Pulse 86   Resp 16   Ht 6' (1.829 m)   Wt 223 lb (101.2 kg)   BMI 30.24 kg/m  Gen:  WD/WN, NAD Head: [place]/AT, No temporalis wasting.  Ear/Nose/Throat: Hearing grossly intact, nares w/o erythema or drainage, oropharynx w/o Erythema/Exudate Eyes: Conjunctiva clear, sclera non-icteric  Neck: trachea midline.  No JVD.  Pulmonary:  Good air movement, respirations not labored, no use of accessory muscles  Cardiac: Irregular Vascular:  Vessel Right Left  Radial Palpable Palpable                          DP  1+  1+  PT  1+  not palpable   Gastrointestinal:. No masses, surgical incisions, or scars. Musculoskeletal: M/S 5/5 throughout.  Extremities without ischemic changes.  No deformity or atrophy.  2+ left lower extremity edema.  Walks with a walker Neurologic: Sensation grossly intact in extremities.  Symmetrical.  Speech is fluent. Motor exam as listed above. Psychiatric: Judgment intact, Mood & affect appropriate for pt's clinical situation. Dermatologic: No rashes or ulcers noted.  No cellulitis or open wounds.    Radiology US Venous Img Lower Unilateral Left (DVT)  Result Date: 10/06/2019 CLINICAL DATA:  Edema left lower extremity EXAM: LEFT  LOWER EXTREMITY VENOUS DOPPLER ULTRASOUND TECHNIQUE: Gray-scale sonography with graded compression, as well as color Doppler and duplex ultrasound were performed to evaluate the lower extremity deep venous systems from the level of the common femoral vein and including the common femoral, femoral, profunda femoral, popliteal and calf veins including the posterior tibial, peroneal and gastrocnemius veins when visible. The superficial great saphenous vein was also interrogated. Spectral Doppler was utilized  to evaluate flow at rest and with distal augmentation maneuvers in the common femoral, femoral and popliteal veins. COMPARISON:  None. FINDINGS: Contralateral Common Femoral Vein: Respiratory phasicity is normal and symmetric with the symptomatic side. No evidence of thrombus. Normal compressibility. Common Femoral Vein: No evidence of thrombus. Normal compressibility, respiratory phasicity and response to augmentation. Saphenofemoral Junction: No evidence of thrombus. Normal compressibility and flow on color Doppler imaging. Profunda Femoral Vein: Occlusive thrombus noted. Femoral Vein: Occlusive thrombus noted. Popliteal Vein: Occlusive thrombus noted. Calf Veins: Possible partial thrombosis of the gastrocnemius veins. Superficial Great Saphenous Vein: No evidence of thrombus. Normal compressibility. Venous Reflux:  Not assessed Other Findings:  None. IMPRESSION: Occlusive thrombus within the left profunda, left femoral, and left popliteal veins. Electronically Signed   By: Charlett Nose M.D.   On: 10/06/2019 17:49    Labs No results found for this or any previous visit (from the past 2160 hour(s)).  Assessment/Plan:  Atrial fibrillation (HCC) Rate controlled and on Eliquis  Hypertension blood pressure control important in reducing the progression of atherosclerotic disease. On appropriate oral medications.   Hyperlipidemia lipid control important in reducing the progression of atherosclerotic disease. Continue statin therapy   DVT (deep venous thrombosis) (HCC) The patient has an extensive left lower extremity DVT with persistent swelling despite anticoagulation.  We are at about the 2-week mark now.  We discussed that thrombectomy can be performed up to 3 weeks after DVT with good results.  I discussed the risks and benefits of this procedure.  After a long discussion with he and his wife, they would like to have this performed and will be scheduled for the near future at their  convenience.      Festus Barren 10/21/2019, 1:15 PM   This note was created with  Dragon medical transcription system.  Any errors from dictation are unintentional.

## 2019-10-21 NOTE — Assessment & Plan Note (Signed)
lipid control important in reducing the progression of atherosclerotic disease. Continue statin therapy  

## 2019-10-21 NOTE — Telephone Encounter (Signed)
Spoke with the patient's wife and he is now scheduled with Dr. Wyn Quaker for a left leg thrombectomy on 10/27/19 with a 6:45 am arrival time to the [facility]. Patient will do covid testing on 10/23/19 between 12:30-2:30 pm at the [facility].

## 2019-10-21 NOTE — Assessment & Plan Note (Signed)
Rate controlled and on Eliquis 

## 2019-10-23 ENCOUNTER — Other Ambulatory Visit: Payer: Self-pay

## 2019-10-23 ENCOUNTER — Other Ambulatory Visit
Admission: RE | Admit: 2019-10-23 | Discharge: 2019-10-23 | Disposition: A | Discharge status: Home / Self Care | Payer: Medicare Other | Source: Ambulatory Visit | Attending: Vascular Surgery | Admitting: Vascular Surgery

## 2019-10-23 DIAGNOSIS — Z01812 Encounter for preprocedural laboratory examination: Secondary | ICD-10-CM | POA: Insufficient documentation

## 2019-10-23 DIAGNOSIS — Z20822 Contact with and (suspected) exposure to covid-19: Secondary | ICD-10-CM | POA: Diagnosis not present

## 2019-10-23 LAB — SARS CORONAVIRUS 2 (TAT 6-24 HRS): SARS Coronavirus 2: NEGATIVE

## 2019-10-24 ENCOUNTER — Ambulatory Visit (HOSPITAL_COMMUNITY)
Admission: RE | Admit: 2019-10-24 | Discharge: 2019-10-24 | Disposition: A | Discharge status: Home / Self Care | Payer: Medicare Other | Source: Ambulatory Visit | Attending: Neurology | Admitting: Neurology

## 2019-10-24 ENCOUNTER — Other Ambulatory Visit: Payer: Self-pay

## 2019-10-24 DIAGNOSIS — R278 Other lack of coordination: Secondary | ICD-10-CM | POA: Diagnosis present

## 2019-10-27 ENCOUNTER — Other Ambulatory Visit: Payer: Self-pay

## 2019-10-27 ENCOUNTER — Encounter: Payer: Self-pay | Admitting: Vascular Surgery

## 2019-10-27 ENCOUNTER — Ambulatory Visit
Admission: RE | Admit: 2019-10-27 | Discharge: 2019-10-27 | Disposition: A | Discharge status: Home / Self Care | Payer: Medicare Other | Attending: Vascular Surgery | Admitting: Vascular Surgery

## 2019-10-27 ENCOUNTER — Encounter
Admission: RE | Disposition: A | Discharge status: Home / Self Care | Payer: Self-pay | Source: Home / Self Care | Attending: Vascular Surgery

## 2019-10-27 ENCOUNTER — Other Ambulatory Visit (INDEPENDENT_AMBULATORY_CARE_PROVIDER_SITE_OTHER): Payer: Self-pay | Admitting: Nurse Practitioner

## 2019-10-27 DIAGNOSIS — I80202 Phlebitis and thrombophlebitis of unspecified deep vessels of left lower extremity: Secondary | ICD-10-CM

## 2019-10-27 DIAGNOSIS — Z7901 Long term (current) use of anticoagulants: Secondary | ICD-10-CM | POA: Diagnosis not present

## 2019-10-27 DIAGNOSIS — I82402 Acute embolism and thrombosis of unspecified deep veins of left lower extremity: Secondary | ICD-10-CM | POA: Diagnosis present

## 2019-10-27 DIAGNOSIS — I4821 Permanent atrial fibrillation: Secondary | ICD-10-CM | POA: Diagnosis not present

## 2019-10-27 DIAGNOSIS — Z79899 Other long term (current) drug therapy: Secondary | ICD-10-CM | POA: Diagnosis not present

## 2019-10-27 DIAGNOSIS — I340 Nonrheumatic mitral (valve) insufficiency: Secondary | ICD-10-CM | POA: Insufficient documentation

## 2019-10-27 DIAGNOSIS — I2721 Secondary pulmonary arterial hypertension: Secondary | ICD-10-CM | POA: Insufficient documentation

## 2019-10-27 DIAGNOSIS — Z885 Allergy status to narcotic agent status: Secondary | ICD-10-CM | POA: Insufficient documentation

## 2019-10-27 DIAGNOSIS — E7800 Pure hypercholesterolemia, unspecified: Secondary | ICD-10-CM | POA: Insufficient documentation

## 2019-10-27 DIAGNOSIS — I10 Essential (primary) hypertension: Secondary | ICD-10-CM | POA: Diagnosis not present

## 2019-10-27 DIAGNOSIS — Z7982 Long term (current) use of aspirin: Secondary | ICD-10-CM | POA: Diagnosis not present

## 2019-10-27 DIAGNOSIS — Z8673 Personal history of transient ischemic attack (TIA), and cerebral infarction without residual deficits: Secondary | ICD-10-CM | POA: Insufficient documentation

## 2019-10-27 DIAGNOSIS — K219 Gastro-esophageal reflux disease without esophagitis: Secondary | ICD-10-CM | POA: Insufficient documentation

## 2019-10-27 DIAGNOSIS — G40409 Other generalized epilepsy and epileptic syndromes, not intractable, without status epilepticus: Secondary | ICD-10-CM | POA: Diagnosis not present

## 2019-10-27 DIAGNOSIS — Z8679 Personal history of other diseases of the circulatory system: Secondary | ICD-10-CM | POA: Insufficient documentation

## 2019-10-27 DIAGNOSIS — Z888 Allergy status to other drugs, medicaments and biological substances status: Secondary | ICD-10-CM | POA: Diagnosis not present

## 2019-10-27 DIAGNOSIS — I82409 Acute embolism and thrombosis of unspecified deep veins of unspecified lower extremity: Secondary | ICD-10-CM

## 2019-10-27 DIAGNOSIS — E785 Hyperlipidemia, unspecified: Secondary | ICD-10-CM | POA: Diagnosis not present

## 2019-10-27 HISTORY — PX: PERIPHERAL VASCULAR THROMBECTOMY: CATH118306

## 2019-10-27 LAB — BUN: BUN: 13 mg/dL (ref 8–23)

## 2019-10-27 LAB — CREATININE, SERUM
Creatinine, Ser: 0.95 mg/dL (ref 0.61–1.24)
GFR calc Af Amer: >60 mL/min (ref >60)
GFR calc non Af Amer: >60 mL/min (ref >60)

## 2019-10-27 SURGERY — PERIPHERAL VASCULAR THROMBECTOMY
Anesthesia: Moderate Sedation | Laterality: Left

## 2019-10-27 MED ORDER — DIPHENHYDRAMINE HCL 50 MG/ML IJ SOLN: 50.0000 mg | Freq: Once | INTRAMUSCULAR | Status: DC | PRN

## 2019-10-27 MED ORDER — CEFAZOLIN SODIUM-DEXTROSE 2-4 GM/100ML-% IV SOLN: 2.0000 g | Freq: Once | INTRAVENOUS | Status: DC

## 2019-10-27 MED ORDER — HEPARIN SODIUM (PORCINE) 1000 UNIT/ML IJ SOLN
INTRAMUSCULAR | Status: DC | PRN
  Administered 2019-10-27: 3000 [IU] via INTRAVENOUS

## 2019-10-27 MED ORDER — FAMOTIDINE 20 MG PO TABS: 40.0000 mg | ORAL_TABLET | Freq: Once | ORAL | Status: DC | PRN

## 2019-10-27 MED ORDER — MIDAZOLAM HCL 2 MG/ML PO SYRP: 8.0000 mg | ORAL_SOLUTION | Freq: Once | ORAL | Status: DC | PRN

## 2019-10-27 MED ORDER — FENTANYL CITRATE (PF) 100 MCG/2ML IJ SOLN
INTRAMUSCULAR | Status: DC | PRN
  Administered 2019-10-27 (×2): 25 ug via INTRAVENOUS

## 2019-10-27 MED ORDER — SODIUM CHLORIDE 0.9 % IV SOLN: INTRAVENOUS | Status: DC

## 2019-10-27 MED ORDER — IODIXANOL 320 MG/ML IV SOLN
INTRAVENOUS | Status: DC | PRN
  Administered 2019-10-27: 60 mL

## 2019-10-27 MED ORDER — METHYLPREDNISOLONE SODIUM SUCC 125 MG IJ SOLR: 125.0000 mg | Freq: Once | INTRAMUSCULAR | Status: DC | PRN

## 2019-10-27 MED ORDER — HYDROMORPHONE HCL 1 MG/ML IJ SOLN: 1.0000 mg | Freq: Once | INTRAMUSCULAR | Status: DC | PRN

## 2019-10-27 MED ORDER — MIDAZOLAM HCL 5 MG/5ML IJ SOLN
INTRAMUSCULAR | Status: AC
  Filled 2019-10-27: qty 5

## 2019-10-27 MED ORDER — HEPARIN SODIUM (PORCINE) 1000 UNIT/ML IJ SOLN
INTRAMUSCULAR | Status: AC
  Filled 2019-10-27: qty 1

## 2019-10-27 MED ORDER — ONDANSETRON HCL 4 MG/2ML IJ SOLN: 4.0000 mg | Freq: Four times a day (QID) | INTRAMUSCULAR | Status: DC | PRN

## 2019-10-27 MED ORDER — FENTANYL CITRATE (PF) 100 MCG/2ML IJ SOLN
INTRAMUSCULAR | Status: AC
  Filled 2019-10-27: qty 2

## 2019-10-27 MED ORDER — MIDAZOLAM HCL 2 MG/2ML IJ SOLN
INTRAMUSCULAR | Status: DC | PRN
  Administered 2019-10-27: 1 mg via INTRAVENOUS
  Administered 2019-10-27: 2 mg via INTRAVENOUS

## 2019-10-27 SURGICAL SUPPLY — 10 items
BALLN MUSTANG 10X80X135 (BALLOONS) ×3
BALLOON MUSTANG 10X80X135 (BALLOONS) ×1 IMPLANT
CANISTER PENUMBRA ENGINE (MISCELLANEOUS) ×3 IMPLANT
CANNULA 5F STIFF (CANNULA) ×3 IMPLANT
CATH INDIGO 12XTORQ 100 (CATHETERS) ×3 IMPLANT
DEVICE PRESTO INFLATION (MISCELLANEOUS) ×3 IMPLANT
PACK ANGIOGRAPHY (CUSTOM PROCEDURE TRAY) ×3 IMPLANT
SHEATH PINNACLE 11FRX10 (SHEATH) ×3 IMPLANT
WIRE J 3MM .035X145CM (WIRE) ×3 IMPLANT
WIRE MAGIC TORQUE 260C (WIRE) ×3 IMPLANT

## 2019-10-27 NOTE — H&P (Signed)
[facility] History & Physical Update  The patient was interviewed and re-examined.  The patient's previous History and Physical has been reviewed and is unchanged.  There is no change in the plan of care. We plan to proceed with the scheduled procedure.  Festus Barren, MD  10/27/2019, 8:10 AM

## 2019-10-27 NOTE — Discharge Instructions (Signed)
Deep Vein Thrombosis  Deep vein thrombosis (DVT) is a condition in which a blood clot forms in a deep vein, such as a lower leg, thigh, or arm vein. A clot is blood that has thickened into a gel or solid. This condition is dangerous. It can lead to serious and even life-threatening complications if the clot travels to the lungs and causes a blockage (pulmonary embolism). It can also damage veins in the leg. This can result in leg pain, swelling, discoloration, and sores (post-thrombotic syndrome). What are the causes? This condition may be caused by:  A slowdown of blood flow.  Damage to a vein.  A condition that causes blood to clot more easily, such as an inherited clotting disorder. What increases the risk? The following factors may make you more likely to develop this condition:  Being overweight.  Being older, especially over age 60.  Sitting or lying down for more than four hours.  Being in the hospital.  Lack of physical activity (sedentary lifestyle).  Pregnancy, being in childbirth, or having recently given birth.  Taking medicines that contain estrogen, such as medicines to prevent pregnancy.  Smoking.  A history of any of the following: ? Blood clots or a blood clotting disease. ? Peripheral vascular disease. ? Inflammatory bowel disease. ? Cancer. ? Heart disease. ? Genetic conditions that affect how your blood clots, such as Factor V Leiden mutation. ? Neurological diseases that affect your legs (leg paresis). ? A recent injury, such as a car accident. ? Major or lengthy surgery. ? A central line placed inside a large vein. What are the signs or symptoms? Symptoms of this condition include:  Swelling, pain, or tenderness in an arm or leg.  Warmth, redness, or discoloration in an arm or leg. If the clot is in your leg, symptoms may be more noticeable or worse when you stand or walk. Some people may not develop any symptoms. How is this diagnosed? This  condition is diagnosed with:  A medical history and physical exam.  Tests, such as: ? Blood tests. These are done to check how well your blood clots. ? Ultrasound. This is done to check for clots. ? Venogram. For this test, contrast dye is injected into a vein and X-rays are taken to check for any clots. How is this treated? Treatment for this condition depends on:  The cause of your DVT.  Your risk for bleeding or developing more clots.  Any other medical conditions that you have. Treatment may include:  Taking a blood thinner (anticoagulant). This type of medicine prevents clots from forming. It may be taken by mouth, injected under the skin, or injected through an IV (catheter).  Injecting clot-dissolving medicines into the affected vein (catheter-directed thrombolysis).  Having surgery. Surgery may be done to: ? Remove the clot. ? Place a filter in a large vein to catch blood clots before they reach the lungs. Some treatments may be continued for up to six months. Follow these instructions at home: If you are taking blood thinners:  Take the medicine exactly as told by your health care provider. Some blood thinners need to be taken at the same time every day. Do not skip a dose.  Talk with your health care provider before you take any medicines that contain aspirin or NSAIDs. These medicines increase your risk for dangerous bleeding.  Ask your health care provider about foods and drugs that could change the way the medicine works (may interact). Avoid those things if your   health care provider tells you to do so.  Blood thinners can cause easy bruising and may make it difficult to stop bleeding. Because of this: ? Be very careful when using knives, scissors, or other sharp objects. ? Use an electric razor instead of a blade. ? Avoid activities that could cause injury or bruising, and follow instructions about how to prevent falls.  Wear a medical alert bracelet or carry a  card that lists what medicines you take. General instructions  Take over-the-counter and prescription medicines only as told by your health care provider.  Return to your normal activities as told by your health care provider. Ask your health care provider what activities are safe for you.  Wear compression stockings if recommended by your health care provider.  Keep all follow-up visits as told by your health care provider. This is important. How is this prevented? To lower your risk of developing this condition again:  For 30 or more minutes every day, do an activity that: ? Involves moving your arms and legs. ? Increases your heart rate.  When traveling for longer than four hours: ? Exercise your arms and legs every hour. ? Drink plenty of water. ? Avoid drinking alcohol.  Avoid sitting or lying for a long time without moving your legs.  If you have surgery or you are hospitalized, ask about ways to prevent blood clots. These may include taking frequent walks or using anticoagulants.  Stay at a healthy weight.  If you are a woman who is older than age 61, avoid unnecessary use of medicines that contain estrogen, such as some birth control pills.  Do not use any products that contain nicotine or tobacco, such as cigarettes and e-cigarettes. This is especially important if you take estrogen medicines. If you need help quitting, ask your health care provider. Contact a health care provider if:  You miss a dose of your blood thinner.  Your menstrual period is heavier than usual.  You have unusual bruising. Get help right away if:  You have: ? New or increased pain, swelling, or redness in an arm or leg. ? Numbness or tingling in an arm or leg. ? Shortness of breath. ? Chest pain. ? A rapid or irregular heartbeat. ? A severe headache or confusion. ? A cut that will not stop bleeding.  There is blood in your vomit, stool, or urine.  You have a serious fall or accident,  or you hit your head.  You feel light-headed or dizzy.  You cough up blood. These symptoms may represent a serious problem that is an emergency. Do not wait to see if the symptoms will go away. Get medical help right away. Call your local emergency services (911 in the [place] Do not drive yourself to the hospital. Summary  Deep vein thrombosis (DVT) is a condition in which a blood clot forms in a deep vein, such as a lower leg, thigh, or arm vein.  Symptoms can include swelling, warmth, pain, and redness in your leg or arm.  This condition may be treated with a blood thinner (anticoagulant medicine), medicine that is injected to dissolve blood clots,compression stockings, or surgery.  If you are prescribed blood thinners, take them exactly as told. This information is not intended to replace advice given to you by your health care provider. Make sure you discuss any questions you have with your health care provider. Document Revised: 08/24/2017 Document Reviewed: 02/09/2017 Elsevier Patient Education  [employer].   Moderate Conscious  Sedation, Adult, Care After These instructions provide you with information about caring for yourself after your procedure. Your health care provider may also give you more specific instructions. Your treatment has been planned according to current medical practices, but problems sometimes occur. Call your health care provider if you have any problems or questions after your procedure. What can I expect after the procedure? After your procedure, it is common:  To feel sleepy for several hours.  To feel clumsy and have poor balance for several hours.  To have poor judgment for several hours.  To vomit if you eat too soon. Follow these instructions at home: For at least 24 hours after the procedure:   Do not: ? Participate in activities where you could fall or become injured. ? Drive. ? Use heavy machinery. ? Drink alcohol. ? Take sleeping  pills or medicines that cause drowsiness. ? Make important decisions or sign legal documents. ? Take care of children on your own.  Rest. Eating and drinking  Follow the diet recommended by your health care provider.  If you vomit: ? Drink water, juice, or soup when you can drink without vomiting. ? Make sure you have little or no nausea before eating solid foods. General instructions  Have a responsible adult stay with you until you are awake and alert.  Take over-the-counter and prescription medicines only as told by your health care provider.  If you smoke, do not smoke without supervision.  Keep all follow-up visits as told by your health care provider. This is important. Contact a health care provider if:  You keep feeling nauseous or you keep vomiting.  You feel light-headed.  You develop a rash.  You have a fever. Get help right away if:  You have trouble breathing. This information is not intended to replace advice given to you by your health care provider. Make sure you discuss any questions you have with your health care provider. Document Revised: 08/24/2017 Document Reviewed: 01/01/2016 Elsevier Patient Education  2020 [employer].

## 2019-10-27 NOTE — Op Note (Signed)
[facility]   OPERATIVE NOTE   PRE-OPERATIVE DIAGNOSIS: extensive LLE DVT, previous head bleed precluding TPA  POST-OPERATIVE DIAGNOSIS: same   PROCEDURE: 1. US guidance for vascular access to left popliteal 2. Catheter placement into left iliac vein from left popliteal approach 3. IVC gram and left lower extremity venogram 4. Mechanical thrombectomy to left popliteal, superficial femoral vein, common femoral vein, and external iliac vein with the penumbra cat 12 device 5. PTA of the superficial femoral vein and common femoral vein with and mm balloon    SURGEON: Leotis Pain, MD  ASSISTANT(S): none  ANESTHESIA: local with moderate conscious sedation for 30 minutes using 3 mg of Versed and 50 mcg of Fentanyl  ESTIMATED BLOOD LOSS: 200 cc  FINDING(S): 1. Extensive clot from the left popliteal vein up through the superficial femoral vein, common femoral vein, and external iliac vein.  The common iliac vein appeared normal.  No May Thurner syndrome.  SPECIMEN(S): none  INDICATIONS:  Patient is a 79 y.o. male who presents with pronounced left leg swelling and extensive left lower extremity DVT. Patient has marked leg swelling and pain. Venous intervention is performed to reduce the symtpoms and avoid long term postphlebitic symptoms.   DESCRIPTION: After obtaining full informed written consent, the patient was brought back to the vascular suite and placed supine upon the table.Moderate conscious sedation was administered during a face to face encounter with the patient throughout the procedure with my supervision of the RN administering medicines and monitoring the patient's vital signs, pulse oximetry, telemetry and mental status throughout from the start of the procedure until the patient was taken to the recovery room.  The patient was then placed into the prone position. The left popliteal vein was then accessed under direct ultrasound  guidance without difficulty with a micropuncture needle and a permanent image was recorded. I then upsized to an 11Fr sheath over a J wire. 3000 units of heparin were then given. Imaging showed extensive DVT with minimal flow. A Kumpe catheter and Magic tourque wire were then advanced into the CFV and then the external iliac vein and images were performed. There was thrombus in the external iliac vein and initially there was difficult to see the common iliac vein due to spinal hardware.  In an oblique fashion, the common iliac vein was widely patent.  I was able to cross the thrombus and stenosis and advance into the IVC which was patent.I used the Penumbra Cat 12 catheter and evacuated about 50 cc of effluent with mechanical thrombectomy throughout the external iliac veins, CFV, SFV, and popliteal vein. This had moderate improvement. I then treated the SFV, and CFV with several inflations with an 8 cm long 10 mm diameter angioplasty balloon to open a channel. This resulted in some resolution of the thrombus and improved flow. Another pass with the penumbra cat 12 device was performed in the superficial femoral vein, common femoral vein, and external iliac vein with large chunks of thrombus removed and marked improvement.  Another 150 cc of effluent was returned with several large clots.  There was only a small amount of residual thrombus in the common femoral vein, no significant thrombus in the iliac veins, patent IVC, and a small amount of thrombus from the access site in the popliteal vein up to the superficial femoral vein.  I then elected to terminate the procedure. The sheath was removed and a dressing was placed. She was taken to the recovery room in stable condition  having tolerated the procedure well.   COMPLICATIONS: None  CONDITION: Stable  Festus Barren 10/27/2019 9:27 AM

## 2019-10-29 ENCOUNTER — Encounter: Payer: Self-pay | Admitting: Cardiology

## 2019-11-06 ENCOUNTER — Other Ambulatory Visit (INDEPENDENT_AMBULATORY_CARE_PROVIDER_SITE_OTHER): Payer: Self-pay | Admitting: Vascular Surgery

## 2019-11-06 DIAGNOSIS — Z86718 Personal history of other venous thrombosis and embolism: Secondary | ICD-10-CM

## 2019-11-10 ENCOUNTER — Other Ambulatory Visit: Payer: Self-pay

## 2019-11-10 ENCOUNTER — Ambulatory Visit (INDEPENDENT_AMBULATORY_CARE_PROVIDER_SITE_OTHER): Payer: Medicare Other

## 2019-11-10 ENCOUNTER — Encounter (INDEPENDENT_AMBULATORY_CARE_PROVIDER_SITE_OTHER): Payer: Self-pay | Admitting: Nurse Practitioner

## 2019-11-10 ENCOUNTER — Ambulatory Visit (INDEPENDENT_AMBULATORY_CARE_PROVIDER_SITE_OTHER): Payer: Medicare Other | Admitting: Nurse Practitioner

## 2019-11-10 VITALS — BP 179/87 | HR 102 | Resp 16 | Wt 225.6 lb

## 2019-11-10 DIAGNOSIS — Z86718 Personal history of other venous thrombosis and embolism: Secondary | ICD-10-CM | POA: Diagnosis not present

## 2019-11-10 DIAGNOSIS — I82412 Acute embolism and thrombosis of left femoral vein: Secondary | ICD-10-CM

## 2019-11-10 DIAGNOSIS — M7989 Other specified soft tissue disorders: Secondary | ICD-10-CM | POA: Diagnosis not present

## 2019-11-10 DIAGNOSIS — R569 Unspecified convulsions: Secondary | ICD-10-CM

## 2019-11-10 NOTE — Progress Notes (Signed)
SUBJECTIVE:  Patient ID: [name], male    DOB: 1940-12-15, 79 y.o.   MRN: [id number] Chief Complaint  Patient presents with  . Follow-up    [facility] 2week post thrombectomy     HPI  [name] is a 79 y.o. male that returns today after mechanical thrombectomy done on 10/27/2019.  The patient was initially found to have a thrombus on 10/06/2019, however he was not started on anticoagulation due to the concern for previous intracranial bleed.  The patient presented to our office on 10/21/2019 and ultimately the decision was made to undergo thrombectomy of his left lower extremity.  At this time the pain and discomfort are markedly improved however he continues to have swelling of the left lower extremity.  The swelling extends from his foot to his thigh area.  He does note that the thigh feels a little bit more swollen today.  He also notes that his foot stays markedly swollen no matter how much he elevates.  The patient was recently started on Eliquis without complication.  He denies any fever, chills, nausea, vomiting or diarrhea.  Patient does have some difficulty with placing compression socks due to limited mobility due to strokes and multiple accidents.  Today the patient on basis studies show that the DVT is age-indeterminate it involves the left femoral vein, left profunda vein, left popliteal and left gastroc anemias vein.  The study showed that there has been minor improvement.  Past Medical History:  Diagnosis Date  . Arthritis    "lower back on down; into all joints; into my feet" (09/17/2012)  . Chronic low back pain    hx of  . Complication of anesthesia    "cardiac arrest with dye from [place] (04/15/10 records indicate bradycardia, no ischemia, EF 56%), unstable BP with anesthesia)  . GERD (gastroesophageal reflux disease)   . [place]; 1989   "take RX daily" (09/17/2012)  . Herniated disc, cervical   . Hypercholesteremia   . Hypertension    sees Dr. [name], [facility] in [place]  . Intracranial bleed [facility])    december 2010  . Migraines    "2 in my life" (09/17/2012)  . Mitral regurgitation    a. 01/2019 Echo: EF 55-60%, mild conc LVH. RVSP 59.83mmHg. Mildly dil LA. Mild to mod MR. Mild AI.  Marland Kitchen PAH (pulmonary artery hypertension) ([place])   . Permanent atrial fibrillation (HCC)    a. CHA2DS2VASc = 5-->No OAC 2/2 h/o SAH.  Marland Kitchen Retina disorder    hx of torn retina  . Stroke ([place]) 2002   LLE "just a little bit weaker" (09/17/2012)  . Subarachnoid hemorrhage ([place]) 08/2009    Past Surgical History:  Procedure Laterality Date  . BLEPHAROPLASTY  1990's   "right eye" (09/16/2012)  . CARDIOVASCULAR STRESS TEST     nl perfusion w/o ischemia, EF 56%, bradycardia without syncope 04/15/10 (Dr. Bartholome Bill)  . CATARACT EXTRACTION W/ INTRAOCULAR LENS IMPLANT  1980's   "right" (09/17/2012)  . COLONOSCOPY     06/26/09  . EYE MUSCLE SURGERY  1980's   "right eye" (09/17/2012)  . INGUINAL HERNIA REPAIR  04/10/2011   "right" (09/17/2012)  . JOINT REPLACEMENT    . PERIPHERAL VASCULAR THROMBECTOMY Left 10/27/2019   Procedure: PERIPHERAL VASCULAR THROMBECTOMY;  Surgeon: Algernon Huxley, MD;  Location: [facility];  Service: Cardiovascular;  Laterality: Left;  . POSTERIOR LUMBAR FUSION  09/16/2012   "L4-5" (09/17/2012)  . RETINAL DETACHMENT SURGERY  1980's   "right" (09/17/2012)  . TOTAL KNEE ARTHROPLASTY  04/28/2010   "left" (09/17/2012)    Social History   Socioeconomic History  . Marital status: Married    Spouse name: Not on file  . Number of children: Not on file  . Years of education: Not on file  . Highest education level: Not on file  Occupational History  . Not on file  Tobacco Use  . Smoking status: Never Smoker  . Smokeless tobacco: Never Used  Substance and Sexual Activity  . Alcohol use: No  . Drug use: No  . Sexual activity: Not Currently  Other Topics Concern  . Not on file  Social History Narrative   Lives at home  with wife in private residence   Social Determinants of Health   Financial Resource Strain:   . Difficulty of Paying Living Expenses: Not on file  Food Insecurity:   . Worried About Programme researcher, broadcasting/film/video in the Last Year: Not on file  . Ran Out of Food in the Last Year: Not on file  Transportation Needs:   . Lack of Transportation (Medical): Not on file  . Lack of Transportation (Non-Medical): Not on file  Physical Activity:   . Days of Exercise per Week: Not on file  . Minutes of Exercise per Session: Not on file  Stress:   . Feeling of Stress : Not on file  Social Connections:   . Frequency of Communication with Friends and Family: Not on file  . Frequency of Social Gatherings with Friends and Family: Not on file  . Attends Religious Services: Not on file  . Active Member of Clubs or Organizations: Not on file  . Attends Banker Meetings: Not on file  . Marital Status: Not on file  Intimate Partner Violence:   . Fear of Current or Ex-Partner: Not on file  . Emotionally Abused: Not on file  . Physically Abused: Not on file  . Sexually Abused: Not on file    Family History  Problem Relation Age of Onset  . Heart disease Mother     Allergies  Allergen Reactions  . Codeine Nausea Only and Other (See Comments)  . Fentanyl Other (See Comments)    Given before knee surgery, blood pressure became extremely low  . Regadenoson Other (See Comments)    Created cardiac arrest  . Verapamil Other (See Comments)    Per patient: Medication has caused low heart rate and caused patient to have afib that he currently has     Review of Systems   Review of Systems: Negative Unless Checked Constitutional: [] Weight loss  [] Fever  [] Chills Cardiac: [] Chest pain   []  Atrial Fibrillation  [] Palpitations   [] Shortness of breath when laying flat   [] Shortness of breath with exertion. [] Shortness of breath at rest Vascular:  [] Pain in legs with walking   [] Pain in legs with  standing [] Pain in legs when laying flat   [] Claudication    [] Pain in feet when laying flat    [x] History of DVT   [] Phlebitis   [x] Swelling in legs   [] Varicose veins   [] Non-healing ulcers Pulmonary:   [] Uses home oxygen   [] Productive cough   [] Hemoptysis   [] Wheeze  [] COPD   [] Asthma Neurologic:  [] Dizziness   [] Seizures  [] Blackouts [x] History of stroke   [] History of TIA  [] Aphasia   [] Temporary Blindness   [] Weakness or numbness in arm   [x] Weakness or numbness in leg Musculoskeletal:   []   Joint swelling   [] Joint pain   [] Low back pain  []  History of Knee Replacement [] Arthritis [] back Surgeries  []  Spinal Stenosis    Hematologic:  [] Easy bruising  [] Easy bleeding   [] Hypercoagulable state   [] Anemic Gastrointestinal:  [] Diarrhea   [] Vomiting  [] Gastroesophageal reflux/heartburn   [] Difficulty swallowing. [] Abdominal pain Genitourinary:  [] Chronic kidney disease   [] Difficult urination  [] Anuric   [] Blood in urine [] Frequent urination  [] Burning with urination   [] Hematuria Skin:  [] Rashes   [] Ulcers [] Wounds Psychological:  [] History of anxiety   []  History of major depression  []  Memory Difficulties      OBJECTIVE:   Physical Exam  BP (!) 179/87 (BP Location: Right Arm)   Pulse (!) 102   Resp 16   Wt 225 lb 9.6 oz (102.3 kg)   BMI 30.60 kg/m   Gen: WD/WN, NAD Head: [place]/AT, No temporalis wasting.  Ear/Nose/Throat: Hearing grossly intact, nares w/o erythema or drainage Eyes: PER, EOMI, sclera nonicteric.  Neck: Supple, no masses.  No JVD.  Pulmonary:  Good air movement, no use of accessory muscles.  Cardiac: RRR Vascular:  3+ edema left lower extremity Vessel Right Left  Radial Palpable Palpable   Gastrointestinal: soft, non-distended. No guarding/no peritoneal signs.  Musculoskeletal: M/S 5/5 throughout.  No deformity or atrophy.  Neurologic: Pain and light touch intact in extremities.  Symmetrical.  Speech is fluent. Motor exam as listed above. Psychiatric: Judgment  intact, Mood & affect appropriate for pt's clinical situation. Dermatologic:  Mild stasis dermatitis. No Ulcers Noted.  No changes consistent with cellulitis. Lymph : No Cervical lymphadenopathy, no lichenification or skin changes of chronic lymphedema.       ASSESSMENT AND PLAN:  1. Bilateral swelling of feet I had a long discussion with the patient regarding swelling of the left lower extremity following DVT.  In some instances it can take weeks to improve on the swelling.  Also some postphlebitic patients continue to experience swelling possibly due to damage to the valves.  Adherence to conservative therapy will be necessary as the DVT heals.  However due to the patient's lack of mobility we will place the patient in wraps on his left lower extremity today.  He will present to the office on a weekly basis to have them changed.  We will reevaluate his progress in 4 weeks.  Patient is advised to continue to elevate his lower extremities much as possible he should also attempt to exercise as much as he can tolerate.  2. Acute deep vein thrombosis (DVT) of femoral vein of left lower extremity (HCC) At this time the patient has an age-indeterminate thrombus.  This may be due to the fact that he was started on anticoagulation several weeks after initial DVT.  We will have patient return to the office for noninvasive studies at his Unna wrap check to ensure that the DVT has progressed to the chronic state.  3. Seizures (HCC) Patient takes Tegretol daily and has not had a seizure in over 30 years.  We will continue to be managed with PCP.   Current Outpatient Medications on File Prior to Visit  Medication Sig Dispense Refill  . aliskiren (TEKTURNA) 300 MG tablet Take 300 mg by mouth daily.     amoxicillin (AMOXIL) 500 MG tablet Take 500 mg by mouth as directed. Dental procedure only    . carbamazepine (TEGRETOL) 200 MG tablet Take 200 mg by mouth 2 (two) times a day.     . cetirizine (  ZYRTEC) 10  MG tablet Take 10 mg by mouth daily as needed for allergies.     Marland Kitchen doxazosin (CARDURA) 1 MG tablet Take 1 mg by mouth at bedtime.     Marland Kitchen ELIQUIS 5 MG TABS tablet Take 5 mg by mouth 2 (two) times daily.    . furosemide (LASIX) 20 MG tablet Take 2 tablets (40 mg total) by mouth daily. 180 tablet 3  . irbesartan (AVAPRO) 300 MG tablet Take 300 mg by mouth daily.    . naproxen (NAPROSYN) 500 MG tablet Take by mouth.    . Omega-3 Fatty Acids (FISH OIL) 1200 MG CAPS Take 1,200 mg by mouth daily.    . pantoprazole (PROTONIX) 40 MG tablet Take 40 mg by mouth at bedtime.     . simvastatin (ZOCOR) 20 MG tablet Take 20 mg by mouth 2 (two) times daily.     . vitamin B-12 (CYANOCOBALAMIN) 1000 MCG tablet Take 1,000 mcg by mouth daily.    Marland Kitchen aspirin EC 81 MG tablet Take 1 tablet (81 mg total) by mouth daily. (Patient not taking: Reported on 10/21/2019)     No current facility-administered medications on file prior to visit.    There are no Patient Instructions on file for this visit. No follow-ups on file.   Georgiana Spinner, NP  This note was completed with Office manager.  Any errors are purely unintentional.

## 2019-11-17 ENCOUNTER — Other Ambulatory Visit: Payer: Self-pay

## 2019-11-17 ENCOUNTER — Ambulatory Visit (INDEPENDENT_AMBULATORY_CARE_PROVIDER_SITE_OTHER): Payer: Medicare Other | Admitting: Nurse Practitioner

## 2019-11-17 ENCOUNTER — Encounter (INDEPENDENT_AMBULATORY_CARE_PROVIDER_SITE_OTHER): Payer: Self-pay | Admitting: Nurse Practitioner

## 2019-11-17 VITALS — BP 194/90 | HR 105 | Resp 18 | Ht 72.0 in | Wt 224.0 lb

## 2019-11-17 DIAGNOSIS — M7989 Other specified soft tissue disorders: Secondary | ICD-10-CM

## 2019-11-17 DIAGNOSIS — R600 Localized edema: Secondary | ICD-10-CM | POA: Diagnosis not present

## 2019-11-17 NOTE — Progress Notes (Signed)
History of Present Illness  There is no documented history at this time  Assessments & Plan   There are no diagnoses linked to this encounter.    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    Procedure:  3 layer unna wrap was placed Left lower extremity.   Plan:   Follow up in one week.  

## 2019-11-24 ENCOUNTER — Ambulatory Visit (INDEPENDENT_AMBULATORY_CARE_PROVIDER_SITE_OTHER): Payer: Medicare Other | Admitting: Nurse Practitioner

## 2019-11-24 ENCOUNTER — Other Ambulatory Visit: Payer: Self-pay

## 2019-11-24 VITALS — BP 177/85 | HR 87 | Resp 12 | Ht 72.0 in | Wt 220.0 lb

## 2019-11-24 DIAGNOSIS — R600 Localized edema: Secondary | ICD-10-CM

## 2019-11-24 DIAGNOSIS — M7989 Other specified soft tissue disorders: Secondary | ICD-10-CM

## 2019-11-24 NOTE — Progress Notes (Signed)
History of Present Illness  There is no documented history at this time  Assessments & Plan   There are no diagnoses linked to this encounter.    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    Procedure:  3 layer unna wrap was placed Left lower extremity.   Plan:   Follow up in one week.  

## 2019-11-25 ENCOUNTER — Encounter (INDEPENDENT_AMBULATORY_CARE_PROVIDER_SITE_OTHER): Payer: Self-pay | Admitting: Nurse Practitioner

## 2019-12-01 ENCOUNTER — Other Ambulatory Visit: Payer: Self-pay

## 2019-12-01 ENCOUNTER — Ambulatory Visit (INDEPENDENT_AMBULATORY_CARE_PROVIDER_SITE_OTHER): Payer: Medicare Other | Admitting: Nurse Practitioner

## 2019-12-01 ENCOUNTER — Encounter (INDEPENDENT_AMBULATORY_CARE_PROVIDER_SITE_OTHER): Payer: Self-pay

## 2019-12-01 VITALS — BP 171/83 | HR 94 | Resp 16 | Wt 220.0 lb

## 2019-12-01 DIAGNOSIS — R600 Localized edema: Secondary | ICD-10-CM | POA: Diagnosis not present

## 2019-12-01 DIAGNOSIS — M7989 Other specified soft tissue disorders: Secondary | ICD-10-CM

## 2019-12-01 NOTE — Progress Notes (Signed)
History of Present Illness  There is no documented history at this time  Assessments & Plan   There are no diagnoses linked to this encounter.    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    Procedure:  3 layer unna wrap was placed Left lower extremity.   Plan:   Follow up in one week.  

## 2019-12-04 ENCOUNTER — Other Ambulatory Visit (INDEPENDENT_AMBULATORY_CARE_PROVIDER_SITE_OTHER): Payer: Self-pay | Admitting: Vascular Surgery

## 2019-12-04 DIAGNOSIS — I82412 Acute embolism and thrombosis of left femoral vein: Secondary | ICD-10-CM

## 2019-12-04 DIAGNOSIS — Z9862 Peripheral vascular angioplasty status: Secondary | ICD-10-CM

## 2019-12-08 ENCOUNTER — Encounter (INDEPENDENT_AMBULATORY_CARE_PROVIDER_SITE_OTHER): Payer: Self-pay | Admitting: Nurse Practitioner

## 2019-12-08 ENCOUNTER — Ambulatory Visit (INDEPENDENT_AMBULATORY_CARE_PROVIDER_SITE_OTHER): Payer: Medicare Other | Admitting: Nurse Practitioner

## 2019-12-08 ENCOUNTER — Other Ambulatory Visit: Payer: Self-pay

## 2019-12-08 ENCOUNTER — Ambulatory Visit (INDEPENDENT_AMBULATORY_CARE_PROVIDER_SITE_OTHER): Payer: Medicare Other

## 2019-12-08 VITALS — BP 177/82 | HR 42 | Resp 18 | Ht 72.0 in | Wt 160.0 lb

## 2019-12-08 DIAGNOSIS — Z9862 Peripheral vascular angioplasty status: Secondary | ICD-10-CM

## 2019-12-08 DIAGNOSIS — M25471 Effusion, right ankle: Secondary | ICD-10-CM

## 2019-12-08 DIAGNOSIS — I10 Essential (primary) hypertension: Secondary | ICD-10-CM

## 2019-12-08 DIAGNOSIS — I82412 Acute embolism and thrombosis of left femoral vein: Secondary | ICD-10-CM

## 2019-12-08 DIAGNOSIS — M25472 Effusion, left ankle: Secondary | ICD-10-CM

## 2019-12-11 ENCOUNTER — Encounter (INDEPENDENT_AMBULATORY_CARE_PROVIDER_SITE_OTHER): Payer: Self-pay | Admitting: Nurse Practitioner

## 2019-12-11 NOTE — Progress Notes (Signed)
SUBJECTIVE:  Patient ID: [name], male    DOB: 1941-07-04, 79 y.o.   MRN: [id number] Chief Complaint  Patient presents with  . Follow-up    U/S Follow up    HPI  [name] is a 79 y.o. male that presents today for follow-up evaluation of left lower extremity DVT as well as lower extremity edema.  The patient underwent thrombectomy on 10/27/2019.  Of the left popliteal, superficial femoral vein, common femoral vein and external iliac vein.  The patient states that his lower extremity is much better after several weeks of Unna wraps.  He subsequently denies any chest pain or shortness of breath.  He denies any issues taking his Eliquis at this time.  He denies any fever, chills, nausea, fever vomiting or diarrhea.  The patient also did not have an IVC filter placed during his thrombectomy.  Today the patient underwent noninvasive studies.  Patient continues to show acute DVT present in the left femoral vein, left proximal profunda vein and left popliteal vein.  These findings are essentially unchanged from his previous examination.  The patient has noncompressible veins from the proximal femoral vein to the popliteal vein.  Past Medical History:  Diagnosis Date  . Arthritis    "lower back on down; into all joints; into my feet" (09/17/2012)  . Chronic low back pain    hx of  . Complication of anesthesia    "cardiac arrest with dye from [place] (04/15/10 records indicate bradycardia, no ischemia, EF 56%), unstable BP with anesthesia)  . GERD (gastroesophageal reflux disease)   . 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 Herniated disc, cervical   . Hypercholesteremia   . Hypertension    sees Dr. Jerl Mina, [facility] in [place]  . Intracranial bleed [facility])    december 2010  . Migraines    "2 in my life" (09/17/2012)  . Mitral regurgitation    a. 01/2019 Echo: EF 55-60%, mild conc LVH. RVSP 59.71mmHg. Mildly dil LA. Mild to mod MR. Mild AI.  Marland Kitchen PAH  (pulmonary artery hypertension) (HCC)   . Permanent atrial fibrillation (HCC)    a. CHA2DS2VASc = 5-->No OAC 2/2 h/o SAH.  Marland Kitchen Retina disorder    hx of torn retina  . Stroke (HCC) 2002   LLE "just a little bit weaker" (09/17/2012)  . Subarachnoid hemorrhage (HCC) 08/2009    Past Surgical History:  Procedure Laterality Date  . BLEPHAROPLASTY  1990's   "right eye" (09/16/2012)  . CARDIOVASCULAR STRESS TEST     nl perfusion w/o ischemia, EF 56%, bradycardia without syncope 04/15/10 (Dr. Harold Hedge)  . CATARACT EXTRACTION W/ INTRAOCULAR LENS IMPLANT  1980's   "right" (09/17/2012)  . COLONOSCOPY     06/26/09  . EYE MUSCLE SURGERY  1980's   "right eye" (09/17/2012)  . INGUINAL HERNIA REPAIR  04/10/2011   "right" (09/17/2012)  . JOINT REPLACEMENT    . PERIPHERAL VASCULAR THROMBECTOMY Left 10/27/2019   Procedure: PERIPHERAL VASCULAR THROMBECTOMY;  Surgeon: Annice Needy, MD;  Location: [facility];  Service: Cardiovascular;  Laterality: Left;  . POSTERIOR LUMBAR FUSION  09/16/2012   "L4-5" (09/17/2012)  . RETINAL DETACHMENT SURGERY  1980's   "right" (09/17/2012)  . TOTAL KNEE ARTHROPLASTY  04/28/2010   "left" (09/17/2012)    Social History   Socioeconomic History  . Marital status: Married    Spouse name: Not on file  . Number of children: Not on file  .  Years of education: Not on file  . Highest education level: Not on file  Occupational History  . Not on file  Tobacco Use  . Smoking status: Never Smoker  . Smokeless tobacco: Never Used  Substance and Sexual Activity  . Alcohol use: No  . Drug use: No  . Sexual activity: Not Currently  Other Topics Concern  . Not on file  Social History Narrative   Lives at home with wife in private residence   Social Determinants of Health   Financial Resource Strain:   . Difficulty of Paying Living Expenses:   Food Insecurity:   . Worried About Programme researcher, broadcasting/film/video in the Last Year:   . Barista in the Last Year:     Transportation Needs:   . Freight forwarder (Medical):   Marland Kitchen Lack of Transportation (Non-Medical):   Physical Activity:   . Days of Exercise per Week:   . Minutes of Exercise per Session:   Stress:   . Feeling of Stress :   Social Connections:   . Frequency of Communication with Friends and Family:   . Frequency of Social Gatherings with Friends and Family:   . Attends Religious Services:   . Active Member of Clubs or Organizations:   . Attends Banker Meetings:   Marland Kitchen Marital Status:   Intimate Partner Violence:   . Fear of Current or Ex-Partner:   . Emotionally Abused:   Marland Kitchen Physically Abused:   . Sexually Abused:     Family History  Problem Relation Age of Onset  . Heart disease Mother     Allergies  Allergen Reactions  . Codeine Nausea Only and Other (See Comments)  . Fentanyl Other (See Comments)    Given before knee surgery, blood pressure became extremely low  . Regadenoson Other (See Comments)    Created cardiac arrest  . Verapamil Other (See Comments)    Per patient: Medication has caused low heart rate and caused patient to have afib that he currently has     Review of Systems   Review of Systems: Negative Unless Checked Constitutional: [] Weight loss  [] Fever  [] Chills Cardiac: [] Chest pain   [x]  Atrial Fibrillation  [] Palpitations   [] Shortness of breath when laying flat   [] Shortness of breath with exertion. [] Shortness of breath at rest Vascular:  [] Pain in legs with walking   [] Pain in legs with standing [] Pain in legs when laying flat   [] Claudication    [] Pain in feet when laying flat    [x] History of DVT   [] Phlebitis   [] Swelling in legs   [] Varicose veins   [] Non-healing ulcers Pulmonary:   [] Uses home oxygen   [] Productive cough   [] Hemoptysis   [] Wheeze  [] COPD   [] Asthma Neurologic:  [] Dizziness   [] Seizures  [] Blackouts [x] History of stroke   [] History of TIA  [] Aphasia   [] Temporary Blindness   [] Weakness or numbness in arm    [] Weakness or numbness in leg Musculoskeletal:   [] Joint swelling   [] Joint pain   [] Low back pain  []  History of Knee Replacement [x] Arthritis [] back Surgeries  []  Spinal Stenosis    Hematologic:  [] Easy bruising  [] Easy bleeding   [] Hypercoagulable state   [] Anemic Gastrointestinal:  [] Diarrhea   [] Vomiting  [] Gastroesophageal reflux/heartburn   [] Difficulty swallowing. [] Abdominal pain Genitourinary:  [] Chronic kidney disease   [] Difficult urination  [] Anuric   [] Blood in urine [] Frequent urination  [] Burning with urination   [] Hematuria Skin:  []   Rashes   [] Ulcers [] Wounds Psychological:  [] History of anxiety   []  History of major depression  []  Memory Difficulties      OBJECTIVE:   Physical Exam  BP (!) 177/82   Pulse (!) 42   Resp 18   Ht 6' (1.829 m)   Wt 160 lb (72.6 kg)   BMI 21.70 kg/m   Gen: WD/WN, NAD Head: [place]/AT, No temporalis wasting.  Ear/Nose/Throat: Hearing grossly intact, nares w/o erythema or drainage Eyes: PER, EOMI, sclera nonicteric.  Neck: Supple, no masses.  No JVD.  Pulmonary:  Good air movement, no use of accessory muscles.  Cardiac: RRR Vascular:  2+ edema bilaterally Vessel Right Left  Radial Palpable Palpable  Dorsalis Pedis Palpable Palpable  Posterior Tibial Palpable Palpable   Gastrointestinal: soft, non-distended. No guarding/no peritoneal signs.  Musculoskeletal: M/S 5/5 throughout.  No deformity or atrophy.  Neurologic: Pain and light touch intact in extremities.  Symmetrical.  Speech is fluent. Motor exam as listed above. Psychiatric: Judgment intact, Mood & affect appropriate for pt's clinical situation. Dermatologic: No Venous rashes. No Ulcers Noted.  No changes consistent with cellulitis.        ASSESSMENT AND PLAN:  1. Acute deep vein thrombosis (DVT) of femoral vein of left lower extremity (HCC) Noninvasive test today show that the patient continues to have an acute occlusive DVT.  Discussed with the patient that it can take up to  8 weeks or so before it begins to show in a chronic state.  We will have the patient return in 8 weeks for noninvasive studies, patient will continue with twice daily Eliquis.  2. Swelling of both ankles The patient underwent 4 weeks of Unna wraps.  Following those weeks of Unna wraps the patient is doing much better in terms of lower extremity edema.  Patient is advised to utilize medical grade 1 compression stockings as well as elevate his lower extremities as much as possible to help with swelling.  Discussed that the patient had a continue to have swelling at times due to previous DVT.  3. Essential hypertension Blood pressure elevated.  Continues to work with primary care for management.   Current Outpatient Medications on File Prior to Visit  Medication Sig Dispense Refill  . aliskiren (TEKTURNA) 300 MG tablet Take 300 mg by mouth daily.     . carbamazepine (TEGRETOL) 200 MG tablet Take 200 mg by mouth 2 (two) times a day.     . doxazosin (CARDURA) 1 MG tablet Take 1 mg by mouth at bedtime.     ELIQUIS 5 MG TABS tablet Take 5 mg by mouth 2 (two) times daily.    . furosemide (LASIX) 20 MG tablet Take 2 tablets (40 mg total) by mouth daily. 180 tablet 3  . irbesartan (AVAPRO) 300 MG tablet Take 300 mg by mouth daily.    . Omega-3 Fatty Acids (FISH OIL) 1200 MG CAPS Take 1,200 mg by mouth daily.    . pantoprazole (PROTONIX) 40 MG tablet Take 40 mg by mouth at bedtime.     . vitamin B-12 (CYANOCOBALAMIN) 1000 MCG tablet Take 1,000 mcg by mouth daily.    amoxicillin (AMOXIL) 500 MG tablet Take 500 mg by mouth as directed. Dental procedure only    . aspirin EC 81 MG tablet Take 1 tablet (81 mg total) by mouth daily. (Patient not taking: Reported on 10/21/2019)    . cetirizine (ZYRTEC) 10 MG tablet Take 10 mg by mouth daily as needed for  allergies.     . naproxen (NAPROSYN) 500 MG tablet Take by mouth.    . simvastatin (ZOCOR) 20 MG tablet Take 20 mg by mouth 2 (two) times daily.      No  current facility-administered medications on file prior to visit.    There are no Patient Instructions on file for this visit. No follow-ups on file.   Kris Hartmann, NP  This note was completed with Sales executive.  Any errors are purely unintentional.

## 2020-02-03 ENCOUNTER — Other Ambulatory Visit: Payer: Self-pay

## 2020-02-03 ENCOUNTER — Ambulatory Visit: Payer: Medicare Other | Admitting: Cardiovascular Disease

## 2020-02-03 ENCOUNTER — Encounter: Payer: Self-pay | Admitting: Cardiovascular Disease

## 2020-02-03 VITALS — BP 128/82 | HR 70 | Ht 72.0 in | Wt 219.8 lb

## 2020-02-03 DIAGNOSIS — I4821 Permanent atrial fibrillation: Secondary | ICD-10-CM

## 2020-02-03 DIAGNOSIS — I5032 Chronic diastolic (congestive) heart failure: Secondary | ICD-10-CM

## 2020-02-03 DIAGNOSIS — I10 Essential (primary) hypertension: Secondary | ICD-10-CM | POA: Diagnosis not present

## 2020-02-03 NOTE — Patient Instructions (Signed)

## 2020-02-03 NOTE — Progress Notes (Signed)
Cardiology Office Note   Date:  02/03/2020   ID:  [name], DOB [birth year], MRN [id number]  PCP:  [name], MD  Cardiologist:   Kathlyn Sacramento, MD   Chief Complaint  Patient presents with  . other    6 month follow up. Meds reviewed by the pt. verbally. "doing well."       History of Present Illness: [name] is a 79 y.o. male who presents for a follow-up visit regarding chronic atrial fibrillation.  He has history of small subarachnoid hemorrhage in 2010 in the setting of treatment with clopidogrel, previous stroke in December 2013, seizure disorder, hypertension, hyperlipidemia, and multiple falls. The patient was diagnosed last year with A. fib with RVR in the setting of hospitalization. Echo showed an EF of 55 to 60%, moderate pulmonary hypertension, mild to moderate mitral regurgitation and mild aortic insufficiency.  He had sinus pauses while he was on verapamil which was discontinued.   The patient was initially not anticoagulated due to previous history of subarachnoid hemorrhage and also because he was on carbamazepine for seizures with interactions with all DOACs.  Subsequently, he was diagnosed with extensive left lower extremity DVT in January and underwent catheter-based intervention in February.  The patient was anticoagulated with Eliquis and has been stable with anticoagulation with gradual improvement of left leg swelling.  The patient is feeling very well with no chest pain, shortness of breath or palpitations.    Past Medical History:  Diagnosis Date  . Arthritis    "lower back on down; into all joints; into my feet" (09/17/2012)  . Chronic low back pain    hx of  . Complication of anesthesia    "cardiac arrest with dye from [place] (04/15/10 records indicate bradycardia, no ischemia, EF 56%), unstable BP with anesthesia)  . GERD (gastroesophageal reflux disease)   . [place]; 1989   "take RX daily" (09/17/2012)  . Herniated  disc, cervical   . Hypercholesteremia   . Hypertension    sees Dr. [name], [place] [facility] in [place]  . Intracranial bleed [facility])    december 2010  . Migraines    "2 in my life" (09/17/2012)  . Mitral regurgitation    a. 01/2019 Echo: EF 55-60%, mild conc LVH. RVSP 59.49mmHg. Mildly dil LA. Mild to mod MR. Mild AI.  Marland Kitchen PAH (pulmonary artery hypertension) ([place])   . Permanent atrial fibrillation (HCC)    a. CHA2DS2VASc = 5-->No OAC 2/2 h/o SAH.  Marland Kitchen Retina disorder    hx of torn retina  . Stroke ([place]) 2002   LLE "just a little bit weaker" (09/17/2012)  . Subarachnoid hemorrhage ([place]) 08/2009    Past Surgical History:  Procedure Laterality Date  . BLEPHAROPLASTY  1990's   "right eye" (09/16/2012)  . CARDIOVASCULAR STRESS TEST     nl perfusion w/o ischemia, EF 56%, bradycardia without syncope 04/15/10 (Dr. Bartholome Bill)  . CATARACT EXTRACTION W/ INTRAOCULAR LENS IMPLANT  1980's   "right" (09/17/2012)  . COLONOSCOPY     06/26/09  . EYE MUSCLE SURGERY  1980's   "right eye" (09/17/2012)  . INGUINAL HERNIA REPAIR  04/10/2011   "right" (09/17/2012)  . JOINT REPLACEMENT    . PERIPHERAL VASCULAR THROMBECTOMY Left 10/27/2019   Procedure: PERIPHERAL VASCULAR THROMBECTOMY;  Surgeon: Algernon Huxley, MD;  Location: [facility];  Service: Cardiovascular;  Laterality: Left;  . POSTERIOR LUMBAR FUSION  09/16/2012   "L4-5" (09/17/2012)  . RETINAL DETACHMENT SURGERY  1980's   "right" (09/17/2012)  . TOTAL KNEE ARTHROPLASTY  04/28/2010   "left" (09/17/2012)     Current Outpatient Medications  Medication Sig Dispense Refill  . aliskiren (TEKTURNA) 300 MG tablet Take 300 mg by mouth daily.     . carbamazepine (TEGRETOL) 200 MG tablet Take 200 mg by mouth 2 (two) times a day.     . cetirizine (ZYRTEC) 10 MG tablet Take 10 mg by mouth daily as needed for allergies.     Marland Kitchen doxazosin (CARDURA) 1 MG tablet Take 1 mg by mouth at bedtime.     Marland Kitchen ELIQUIS 5 MG TABS tablet Take 5 mg by mouth 2 (two)  times daily.    . furosemide (LASIX) 20 MG tablet Take 2 tablets (40 mg total) by mouth daily. 180 tablet 3  . irbesartan (AVAPRO) 300 MG tablet Take 300 mg by mouth daily.    . Omega-3 Fatty Acids (FISH OIL) 1200 MG CAPS Take 1,200 mg by mouth daily.    . pantoprazole (PROTONIX) 40 MG tablet Take 40 mg by mouth at bedtime.     . simvastatin (ZOCOR) 20 MG tablet Take 20 mg by mouth 2 (two) times daily.     . vitamin B-12 (CYANOCOBALAMIN) 1000 MCG tablet Take 1,000 mcg by mouth daily.    Marland Kitchen amoxicillin (AMOXIL) 500 MG tablet Take 500 mg by mouth as directed. Dental procedure only     No current facility-administered medications for this visit.    Allergies:   Codeine, Fentanyl, Regadenoson, and Verapamil    Social History:  The patient  reports that he has never smoked. He has never used smokeless tobacco. He reports that he does not drink alcohol or use drugs.   Family History:  The patient's family history includes Heart disease in his mother.    ROS:  Please see the history of present illness.   Otherwise, review of systems are positive for none.   All other systems are reviewed and negative.    PHYSICAL EXAM: VS:  BP 128/82 (BP Location: Left Arm, Patient Position: Sitting, Cuff Size: Normal)   Pulse 70   Ht 6' (1.829 m)   Wt 219 lb 12 oz (99.7 kg)   SpO2 98%   BMI 29.80 kg/m  , BMI Body mass index is 29.8 kg/m. GEN: Well nourished, well developed, in no acute distress  HEENT: normal  Neck: no JVD, carotid bruits, or masses Cardiac: Irregularly irregular; no murmurs, rubs, or gallops, mild swelling of left leg compared to the right Respiratory:  clear to auscultation bilaterally, normal work of breathing GI: soft, nontender, nondistended, + BS MS: no deformity or atrophy  Skin: warm and dry, no rash Neuro:  Strength and sensation are intact Psych: euthymic mood, full affect   EKG:  EKG is ordered today. The ekg ordered today demonstrates atrial fibrillation with a  ventricular rate of 70 bpm, left axis deviation, borderline LVH.   Recent Labs: 02/17/2019: TSH 0.997 02/18/2019: Hemoglobin 13.6; Magnesium 2.2; Platelets 192 05/28/2019: Potassium 4.3; Sodium 138 10/27/2019: BUN 13; Creatinine, Ser 0.95    Lipid Panel No results found for: CHOL, TRIG, HDL, CHOLHDL, VLDL, LDLCALC, LDLDIRECT    Wt Readings from Last 3 Encounters:  02/03/20 219 lb 12 oz (99.7 kg)  12/08/19 160 lb (72.6 kg)  12/01/19 220 lb (99.8 kg)       PAD Screen 02/25/2019  Previous PAD dx? No  Previous surgical procedure? No  Pain with walking? Yes  Subsides with rest?  Yes  Feet/toe relief with dangling? No  Painful, non-healing ulcers? No  Extremities discolored? No      ASSESSMENT AND PLAN:  1. Chronic Afib:  Ventricular rate is controlled without medications.  Verapamil was discontinued due to bradycardia and sinus pauses.  He is tolerating anticoagulation with Eliquis. CHADS2VASc 5 (HTN, age x 2, stroke x 2). Initially, anticoagulation was not given due to previous history of subarachnoid hemorrhage and interaction with carbamazepine.  However, with the recent diagnosis of extensive left lower extremity DVT, he has 2 strong indications for anticoagulation and thus the benefit outweighed the risk.  2.  Essential hypertension: Blood pressure is controlled on current medications.  3.  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   4. Mitral regurgitation: Mild to moderate on recent echo. Continue to monitor.  No murmurs by exam.  Disposition:   FU with me in 6 months  Signed,  Lorine Bears, MD  02/03/2020 2:41 PM    [facility]

## 2020-02-09 ENCOUNTER — Other Ambulatory Visit (INDEPENDENT_AMBULATORY_CARE_PROVIDER_SITE_OTHER): Payer: Self-pay | Admitting: Nurse Practitioner

## 2020-02-09 DIAGNOSIS — I82412 Acute embolism and thrombosis of left femoral vein: Secondary | ICD-10-CM

## 2020-02-10 ENCOUNTER — Encounter (INDEPENDENT_AMBULATORY_CARE_PROVIDER_SITE_OTHER): Payer: Self-pay | Admitting: Nurse Practitioner

## 2020-02-10 ENCOUNTER — Other Ambulatory Visit: Payer: Self-pay

## 2020-02-10 ENCOUNTER — Ambulatory Visit (INDEPENDENT_AMBULATORY_CARE_PROVIDER_SITE_OTHER): Payer: Medicare Other | Admitting: Nurse Practitioner

## 2020-02-10 ENCOUNTER — Ambulatory Visit (INDEPENDENT_AMBULATORY_CARE_PROVIDER_SITE_OTHER): Payer: Medicare Other

## 2020-02-10 VITALS — BP 158/77 | HR 91 | Resp 16 | Wt 217.4 lb

## 2020-02-10 DIAGNOSIS — I82412 Acute embolism and thrombosis of left femoral vein: Secondary | ICD-10-CM | POA: Diagnosis not present

## 2020-02-10 DIAGNOSIS — I4821 Permanent atrial fibrillation: Secondary | ICD-10-CM

## 2020-02-10 DIAGNOSIS — R600 Localized edema: Secondary | ICD-10-CM | POA: Diagnosis not present

## 2020-02-10 DIAGNOSIS — I10 Essential (primary) hypertension: Secondary | ICD-10-CM

## 2020-02-10 NOTE — Progress Notes (Signed)
Subjective:    Patient ID: [name], male    DOB: 1940-11-21, 79 y.o.   MRN: [id number] Chief Complaint  Patient presents with  . Follow-up    ultrasound follow up    The patient returns today for follow-up noninvasive studies related to his left lower extremity DVT in the addition to the swelling that occurred post thrombectomy.  The patient initially had a DVT found on 10/06/2019.  He underwent left lower extremity thrombectomy on 10/27/2019.  The patient had severe swelling of the left lower extremity following thrombectomy.  He underwent 4 weeks of Unna wrap therapy and his swelling was significantly better.  Today his swelling has been well contained and he has very minimal evidence of today.  He denies any postphlebitic pain.  He denies any chest pain or shortness of breath.  He denies any TIA-like symptoms.  Overall he has been doing well since his last office visit.  Today noninvasive studies show findings consistent with an acute/age-indeterminate DVT within the left popliteal and left femoral vein.  While the noninvasive studies note that there has been no change since the study done on 12/08/2019 the study done on 12/08/2019 shows evidence of an acute thrombus within the left proximal profunda as well.  Today that was not seen.   Review of Systems  Musculoskeletal: Positive for gait problem.  Neurological: Positive for seizures.  All other systems reviewed and are negative.      Objective:   Physical Exam Vitals reviewed.  HENT:     Head: Normocephalic.  Cardiovascular:     Rate and Rhythm: Normal rate. Rhythm irregular.  Pulmonary:     Effort: Pulmonary effort is normal.     Breath sounds: Normal breath sounds.  Abdominal:     Palpations: Abdomen is soft.  Musculoskeletal:     Comments: Altered balance  Neurological:     Mental Status: He is alert and oriented to person, place, and time.  Psychiatric:        Mood and Affect: Mood normal.        Behavior:  Behavior normal.        Thought Content: Thought content normal.        Judgment: Judgment normal.     BP (!) 158/77 (BP Location: Right Arm)   Pulse 91   Resp 16   Wt 217 lb 6.4 oz (98.6 kg)   BMI 29.48 kg/m   Past Medical History:  Diagnosis Date  . Arthritis    "lower back on down; into all joints; into my feet" (09/17/2012)  . Chronic low back pain    hx of  . Complication of anesthesia    "cardiac arrest with dye from [place] (04/15/10 records indicate bradycardia, no ischemia, EF 56%), unstable BP with anesthesia)  . GERD (gastroesophageal reflux disease)   . [place]; 1989   "take RX daily" (09/17/2012)  . Herniated disc, cervical   . Hypercholesteremia   . Hypertension    sees Dr. [name], [place] [facility] in [place]  . Intracranial bleed [facility])    december 2010  . Migraines    "2 in my life" (09/17/2012)  . Mitral regurgitation    a. 01/2019 Echo: EF 55-60%, mild conc LVH. RVSP 59.66mmHg. Mildly dil LA. Mild to mod MR. Mild AI.  Marland Kitchen PAH (pulmonary artery hypertension) ([place])   . Permanent atrial fibrillation (HCC)    a. CHA2DS2VASc = 5-->No OAC 2/2 h/o SAH.  Marland Kitchen Retina disorder  hx of torn retina  . Stroke (HCC) 2002   LLE "just a little bit weaker" (09/17/2012)  . Subarachnoid hemorrhage (HCC) 08/2009    Social History   Socioeconomic History  . Marital status: Married    Spouse name: Not on file  . Number of children: Not on file  . Years of education: Not on file  . Highest education level: Not on file  Occupational History  . Not on file  Tobacco Use  . Smoking status: Never Smoker  . Smokeless tobacco: Never Used  Substance and Sexual Activity  . Alcohol use: No  . Drug use: No  . Sexual activity: Not Currently  Other Topics Concern  . Not on file  Social History Narrative   Lives at home with wife in private residence   Social Determinants of Health   Financial Resource Strain:   . Difficulty of Paying Living Expenses:   Food  Insecurity:   . Worried About Programme researcher, broadcasting/film/video in the Last Year:   . Barista in the Last Year:   Transportation Needs:   . Freight forwarder (Medical):   Marland Kitchen Lack of Transportation (Non-Medical):   Physical Activity:   . Days of Exercise per Week:   . Minutes of Exercise per Session:   Stress:   . Feeling of Stress :   Social Connections:   . Frequency of Communication with Friends and Family:   . Frequency of Social Gatherings with Friends and Family:   . Attends Religious Services:   . Active Member of Clubs or Organizations:   . Attends Banker Meetings:   Marland Kitchen Marital Status:   Intimate Partner Violence:   . Fear of Current or Ex-Partner:   . Emotionally Abused:   Marland Kitchen Physically Abused:   . Sexually Abused:     Past Surgical History:  Procedure Laterality Date  . BLEPHAROPLASTY  1990's   "right eye" (09/16/2012)  . CARDIOVASCULAR STRESS TEST     nl perfusion w/o ischemia, EF 56%, bradycardia without syncope 04/15/10 (Dr. Harold Hedge)  . CATARACT EXTRACTION W/ INTRAOCULAR LENS IMPLANT  1980's   "right" (09/17/2012)  . COLONOSCOPY     06/26/09  . EYE MUSCLE SURGERY  1980's   "right eye" (09/17/2012)  . INGUINAL HERNIA REPAIR  04/10/2011   "right" (09/17/2012)  . JOINT REPLACEMENT    . PERIPHERAL VASCULAR THROMBECTOMY Left 10/27/2019   Procedure: PERIPHERAL VASCULAR THROMBECTOMY;  Surgeon: Annice Needy, MD;  Location: [facility] INVASIVE CV LAB;  Service: Cardiovascular;  Laterality: Left;  . POSTERIOR LUMBAR FUSION  09/16/2012   "L4-5" (09/17/2012)  . RETINAL DETACHMENT SURGERY  1980's   "right" (09/17/2012)  . TOTAL KNEE ARTHROPLASTY  04/28/2010   "left" (09/17/2012)    Family History  Problem Relation Age of Onset  . Heart disease Mother     Allergies  Allergen Reactions  . Codeine Nausea Only and Other (See Comments)  . Fentanyl Other (See Comments)    Given before knee surgery, blood pressure became extremely low  . Regadenoson Other (See  Comments)    Created cardiac arrest  . Verapamil Other (See Comments)    Per patient: Medication has caused low heart rate and caused patient to have afib that he currently has       Assessment & Plan:   1. Acute deep vein thrombosis (DVT) of femoral vein of left lower extremity (HCC) The results of the patient's noninvasive studies give some minutes conclusions as  to the status of the patient's DVT.  Is described as acute or age indeterminate.  The patient has had this DVTs since 10/06/2019 so for approximately 4 months.  Patient has also been consistent with his anticoagulation and he has not had any issues with swelling after he was removed from Unna wraps to help control his edema post thrombectomy.  Also, manual review of the ultrasound images do show some evidence of chronic process.  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  The patient will also be remaining on anticoagulation due to his history of atrial fibrillation.  Because of this we will have the patient return in 6 months and we will also get a noninvasive study just to check on the progress of his left lower extremity DVT.  Otherwise no further intervention is necessary at this time unless the patient develops worsening lower extremity swelling or concerns for a new DVT.  2. Leg edema, left Left leg edema has been well controlled since the patient's last visit.  Patient is advised to continue with elevation, compression and exercise to help contain his lower extremity edema.  Otherwise, we will have the patient return in 6 months for evaluation of his edema and discomfort.  Patient is advised to follow-up sooner if he has issues.  3. Essential hypertension Continue antihypertensive medications as already ordered, these medications have been reviewed and there are no changes at this time.   4. Permanent atrial fibrillation  [facility]) Patient will remain on anticoagulation due to treatment of atrial fibrillation.  Patient will continue with cardiology for management of atrial fibrillation as well as anticoagulation.   Current Outpatient Medications on File Prior to Visit  Medication Sig Dispense Refill  . aliskiren (TEKTURNA) 300 MG tablet Take 300 mg by mouth daily.     Marland Kitchen amoxicillin (AMOXIL) 500 MG tablet Take 500 mg by mouth as directed. Dental procedure only    . carbamazepine (TEGRETOL) 200 MG tablet Take 200 mg by mouth 2 (two) times a day.     . cetirizine (ZYRTEC) 10 MG tablet Take 10 mg by mouth daily as needed for allergies.     Marland Kitchen doxazosin (CARDURA) 1 MG tablet Take 1 mg by mouth at bedtime.     Marland Kitchen ELIQUIS 5 MG TABS tablet Take 5 mg by mouth 2 (two) times daily.    . furosemide (LASIX) 20 MG tablet Take 2 tablets (40 mg total) by mouth daily. 180 tablet 3  . irbesartan (AVAPRO) 300 MG tablet Take 300 mg by mouth daily.    . Omega-3 Fatty Acids (FISH OIL) 1200 MG CAPS Take 1,200 mg by mouth daily.    . pantoprazole (PROTONIX) 40 MG tablet Take 40 mg by mouth at bedtime.     . simvastatin (ZOCOR) 20 MG tablet Take 20 mg by mouth 2 (two) times daily.     . vitamin B-12 (CYANOCOBALAMIN) 1000 MCG tablet Take 1,000 mcg by mouth daily.     No current facility-administered medications on file prior to visit.    There are no Patient Instructions on file for this visit. No follow-ups on file.   Georgiana Spinner, NP

## 2020-05-19 IMAGING — CR LEFT TIBIA AND FIBULA - 2 VIEW
4 series · 4 of 4 positions shown · non-contrast
Comparison: Left hand radiographs-earlier same day

CLINICAL DATA: Post fall 5 days ago with persistent left leg pain.

EXAM:
LEFT TIBIA AND FIBULA - 2 VIEW

[tibia ap (1 of 2)]
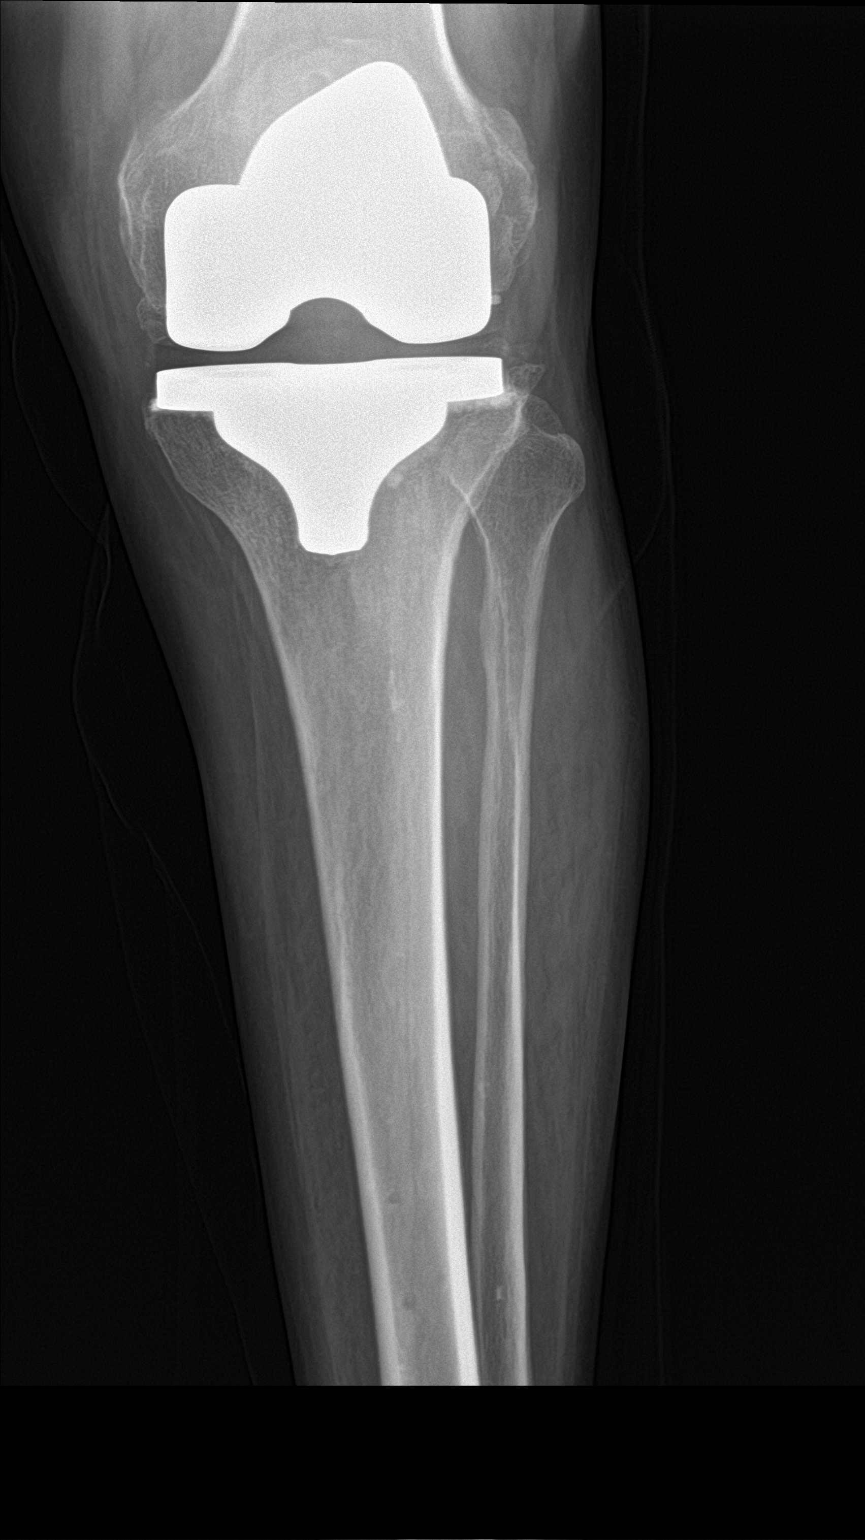

[tibia ap (2 of 2)]
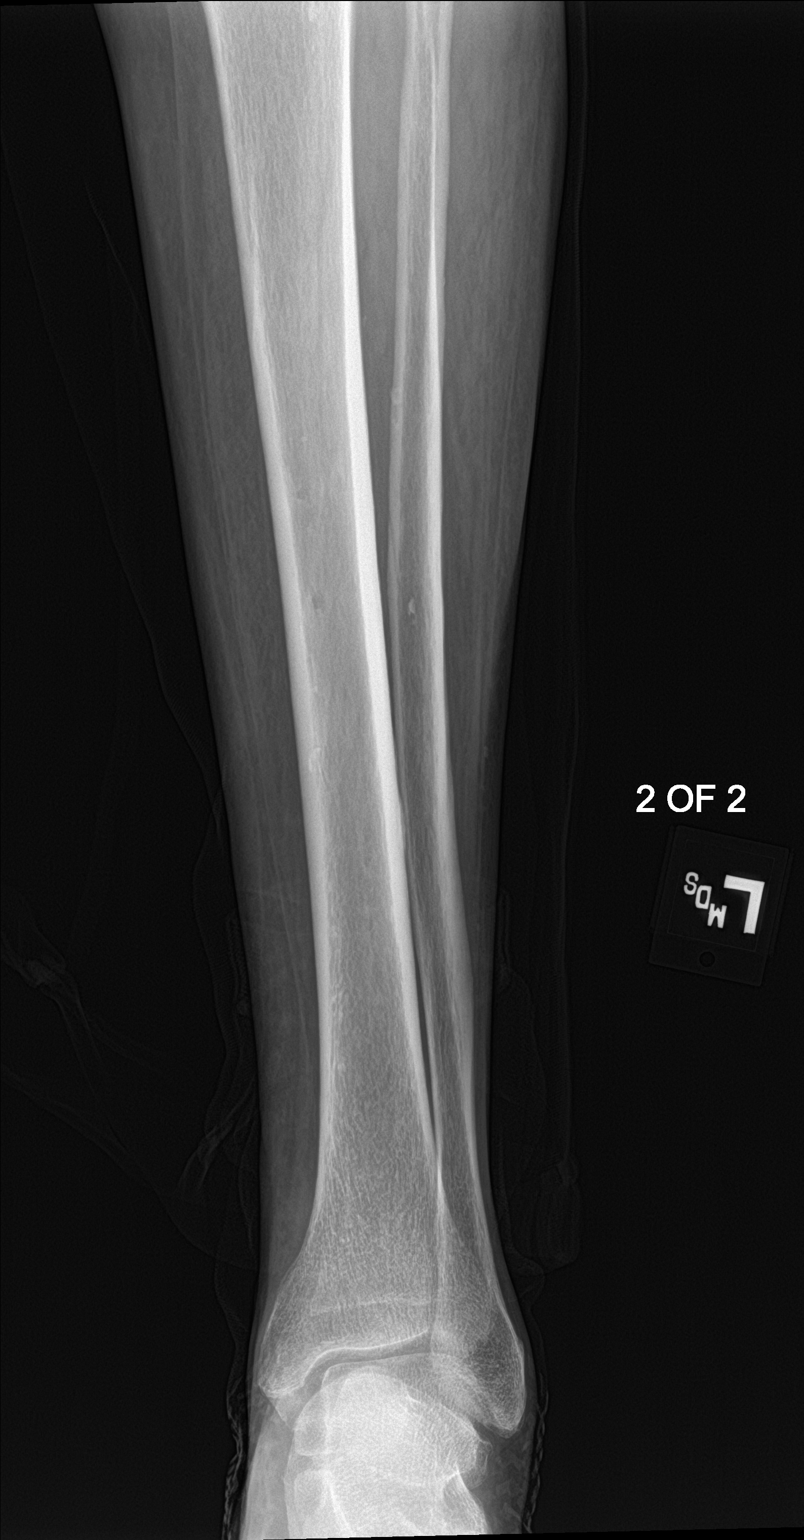

[tibia lat (1 of 2)]
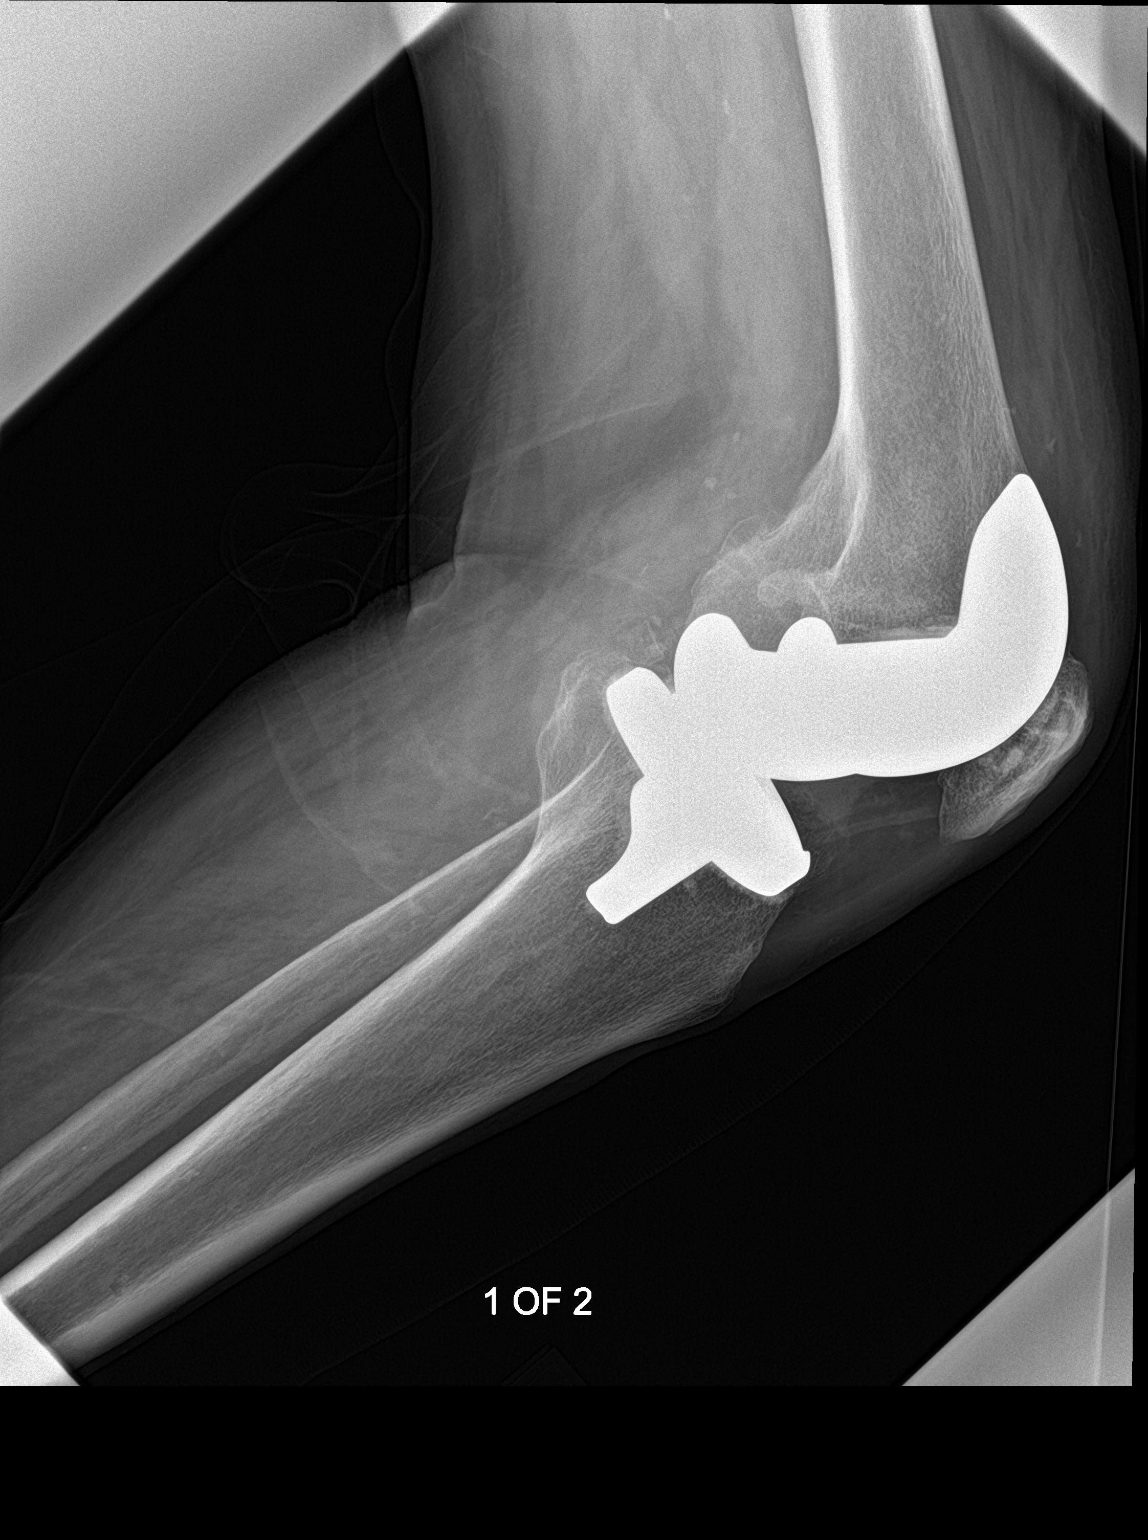

[tibia lat (2 of 2)]
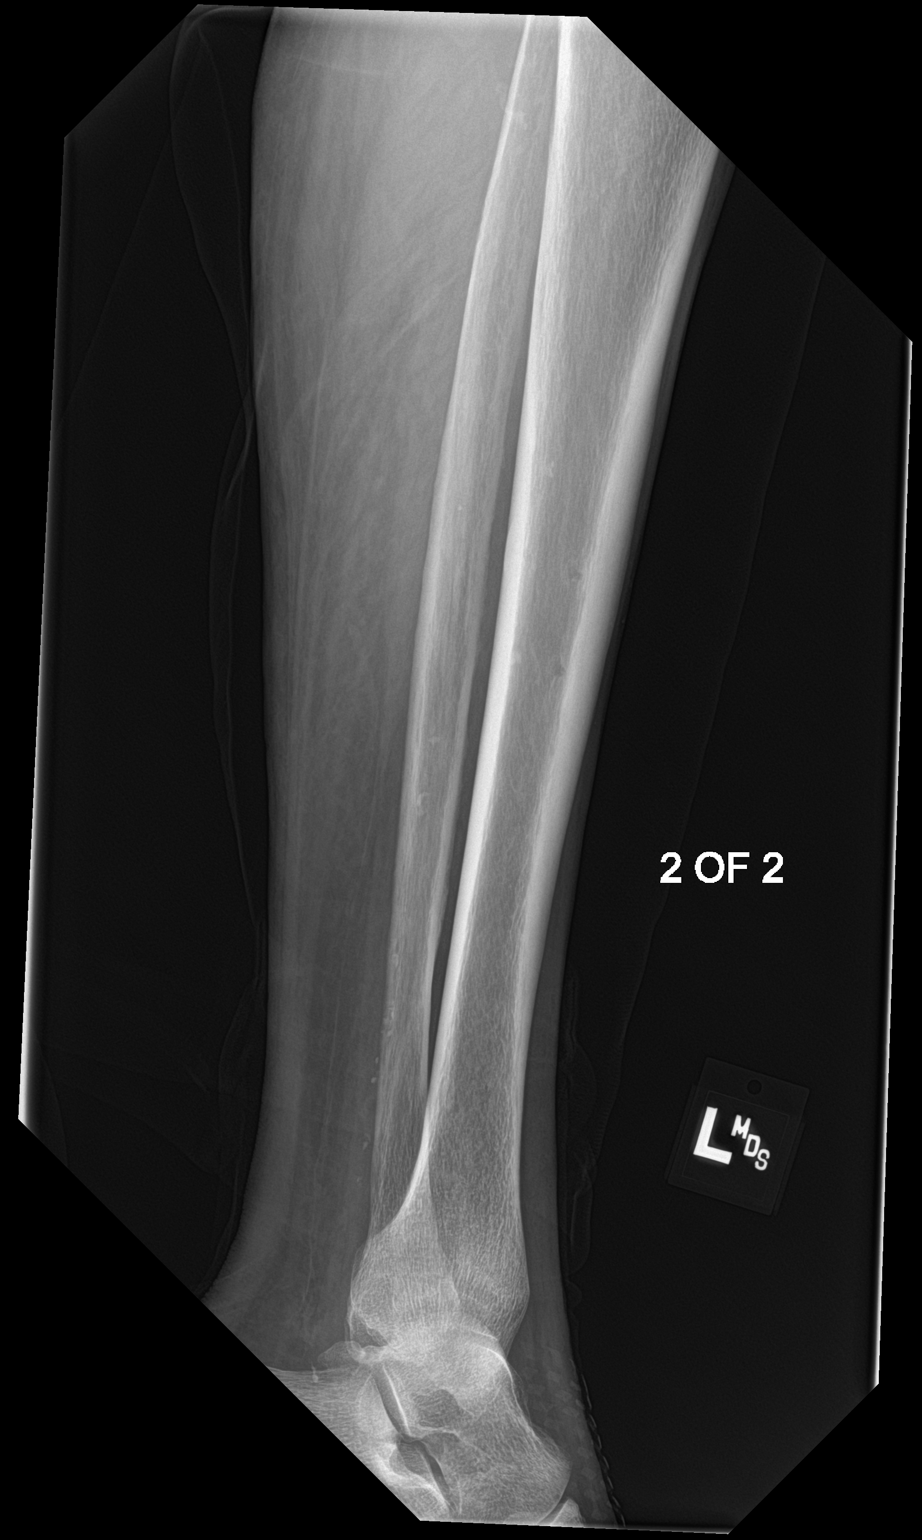

[4 of 4 positions shown; findings below may reference images not displayed]

FINDINGS: No fracture or dislocation.

Post left total knee replacement. No evidence of hardware failure or
loosening. No definite knee joint effusion. Presumed old/abandoned
screw defects noted within the midshaft of the tibia.

Limited visualization of the ankle is normal given obliquity and
large field of view.

Scattered adjacent vascular calcifications. No radiopaque foreign
body. Regional soft tissues appear otherwise normal.
IMPRESSION: 1. Post left total knee replacement without evidence of hardware
failure or loosening.
2. No explanation for patient's left lower leg pain.

## 2020-05-19 IMAGING — CR LEFT FEMUR 2 VIEWS
5 series · 5 of 5 positions shown · non-contrast
Comparison: Left tibia and fibular radiographs-earlier same day

CLINICAL DATA: Post fall 2 weeks ago with persistent left leg and
hip pain.

EXAM:
LEFT FEMUR 2 VIEWS

[femur ap (1 of 3)]
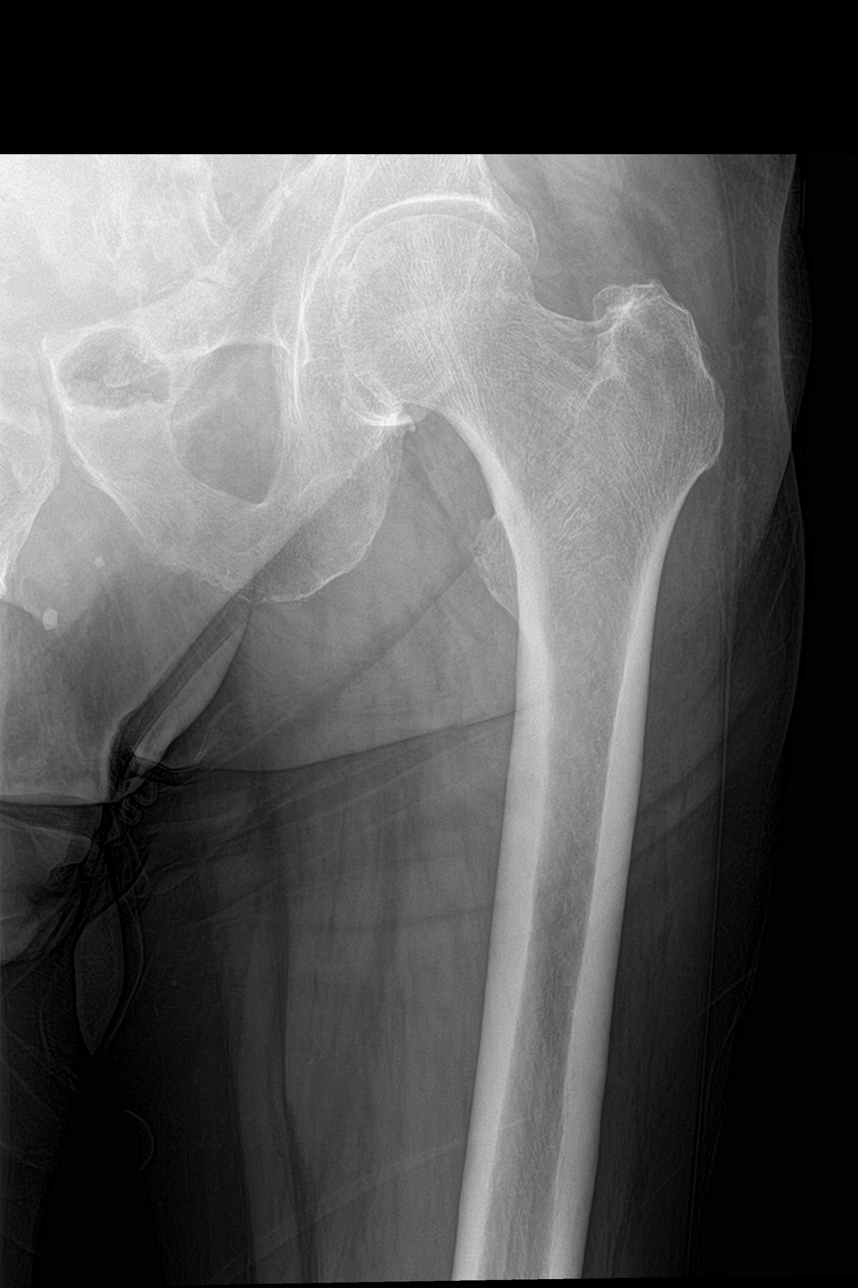

[femur ap (2 of 3)]
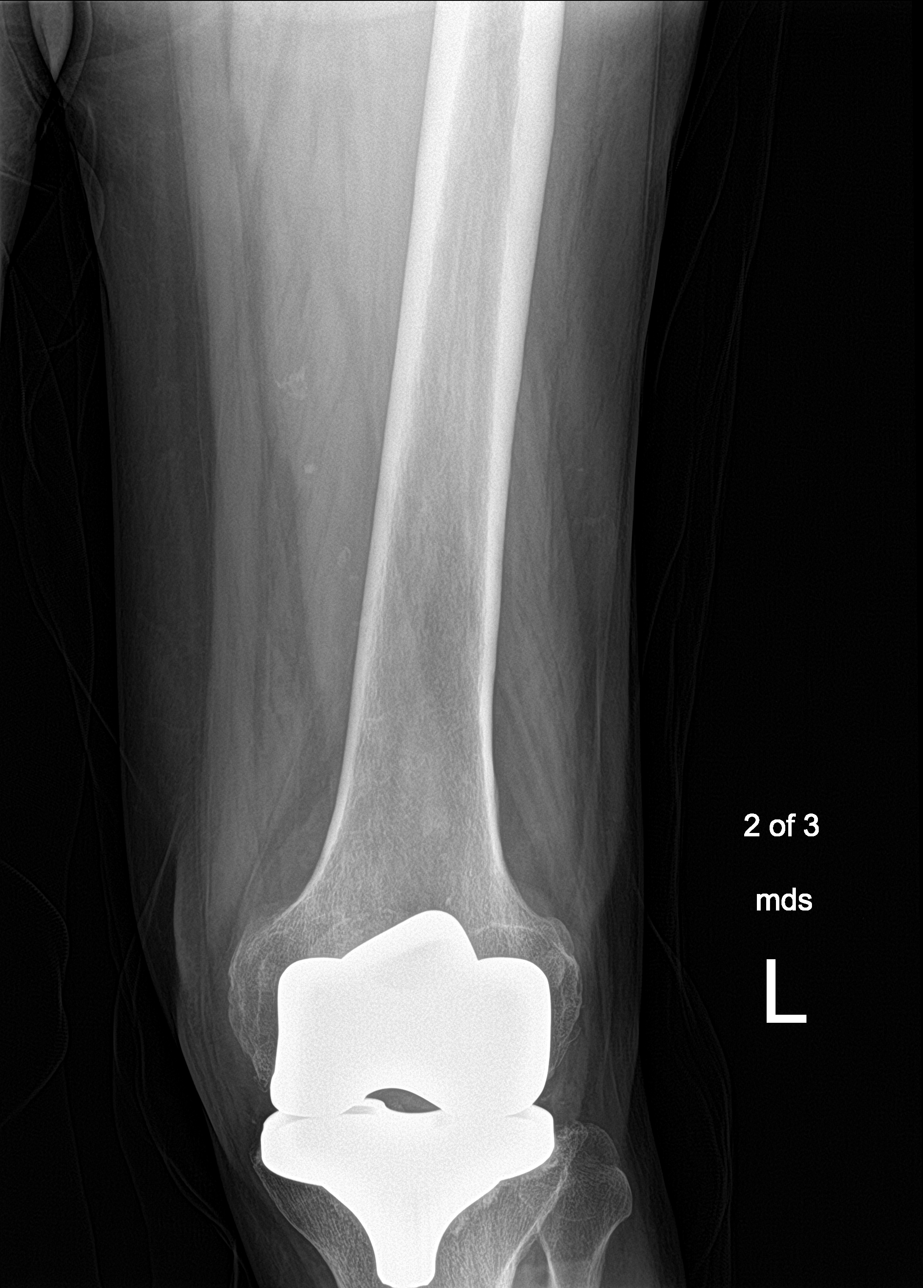

[femur ap (3 of 3)]
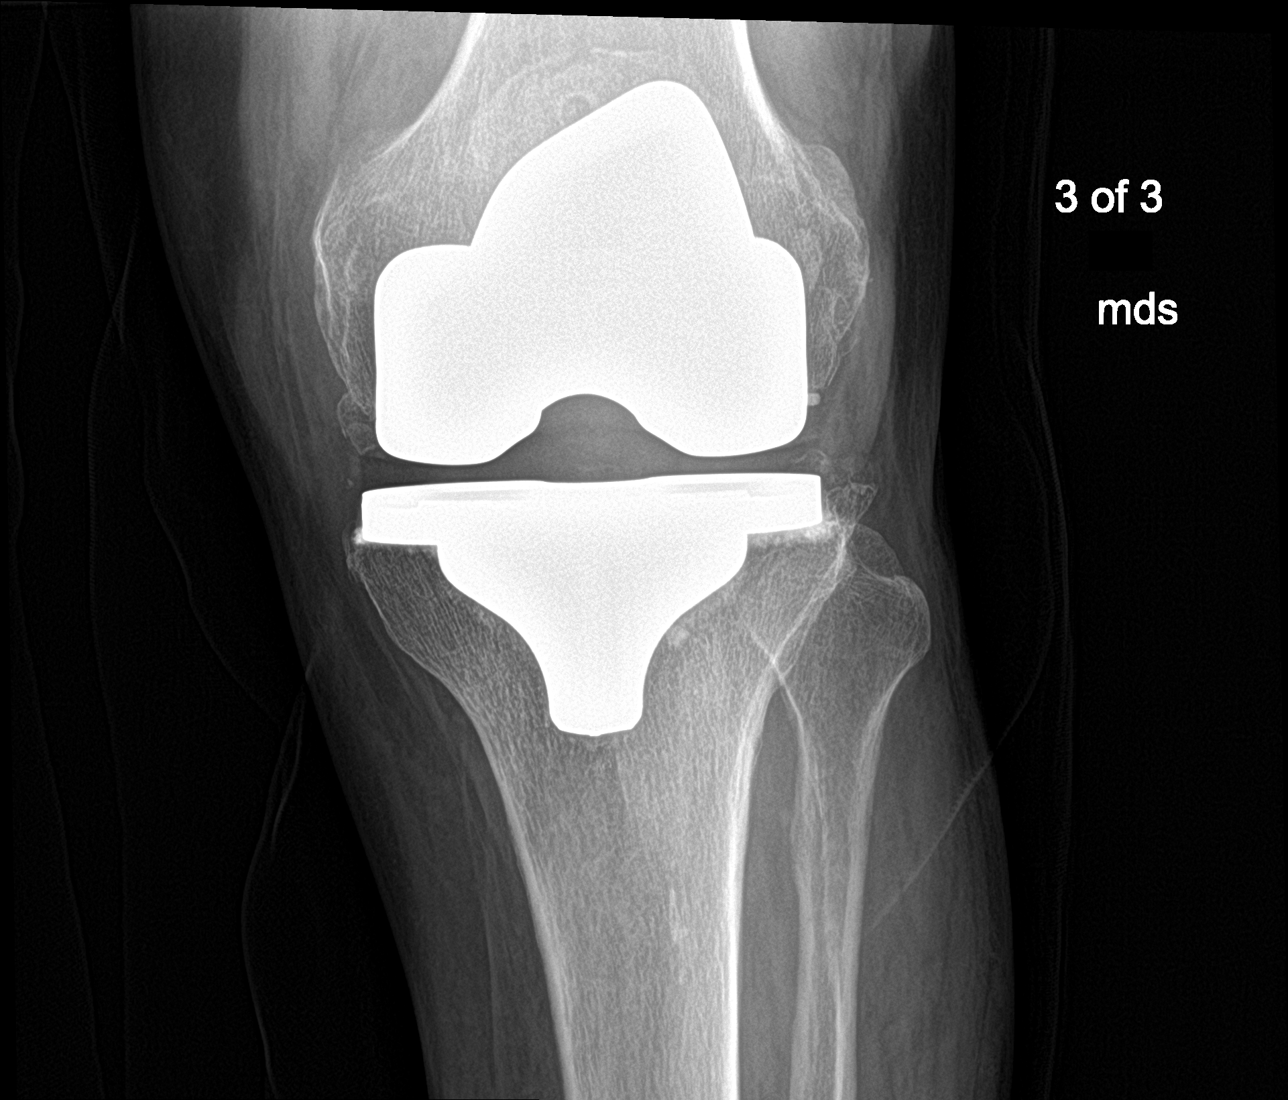

[femur lat (1 of 2)]
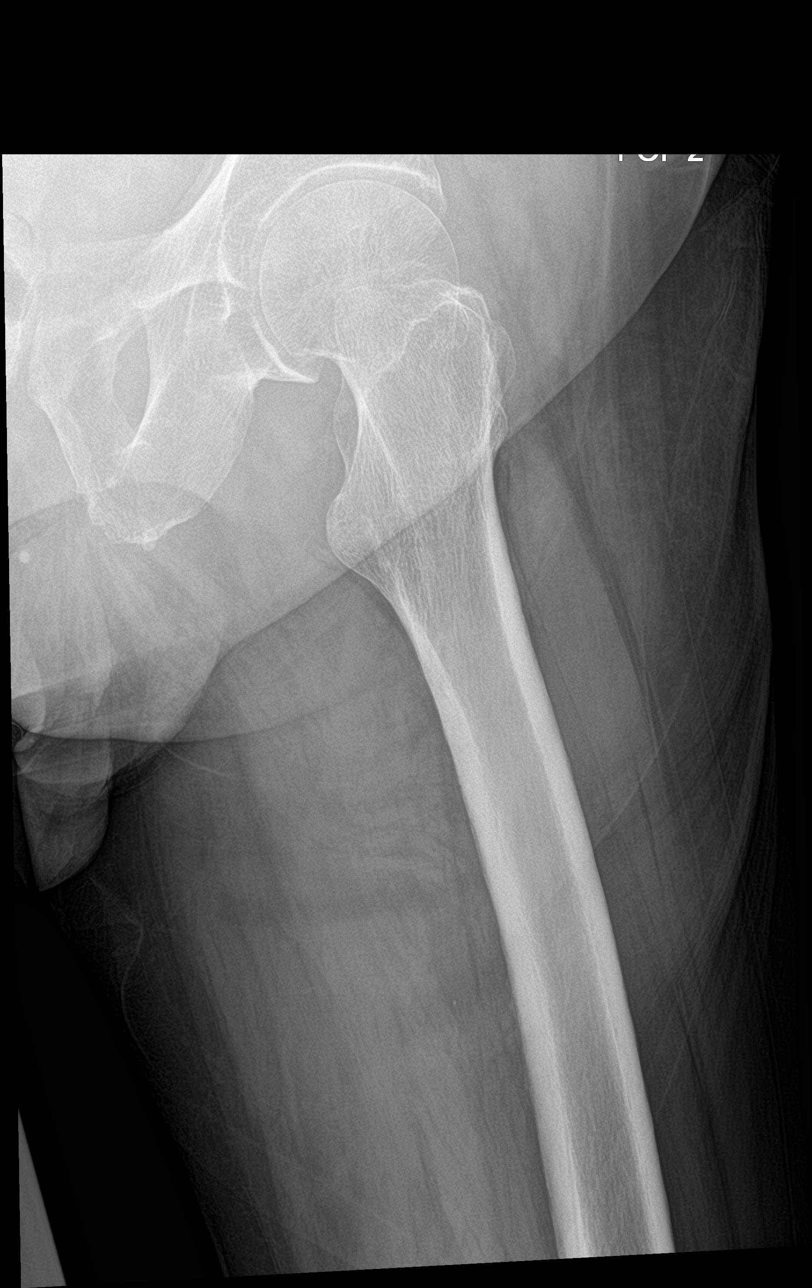

[femur lat (2 of 2)]
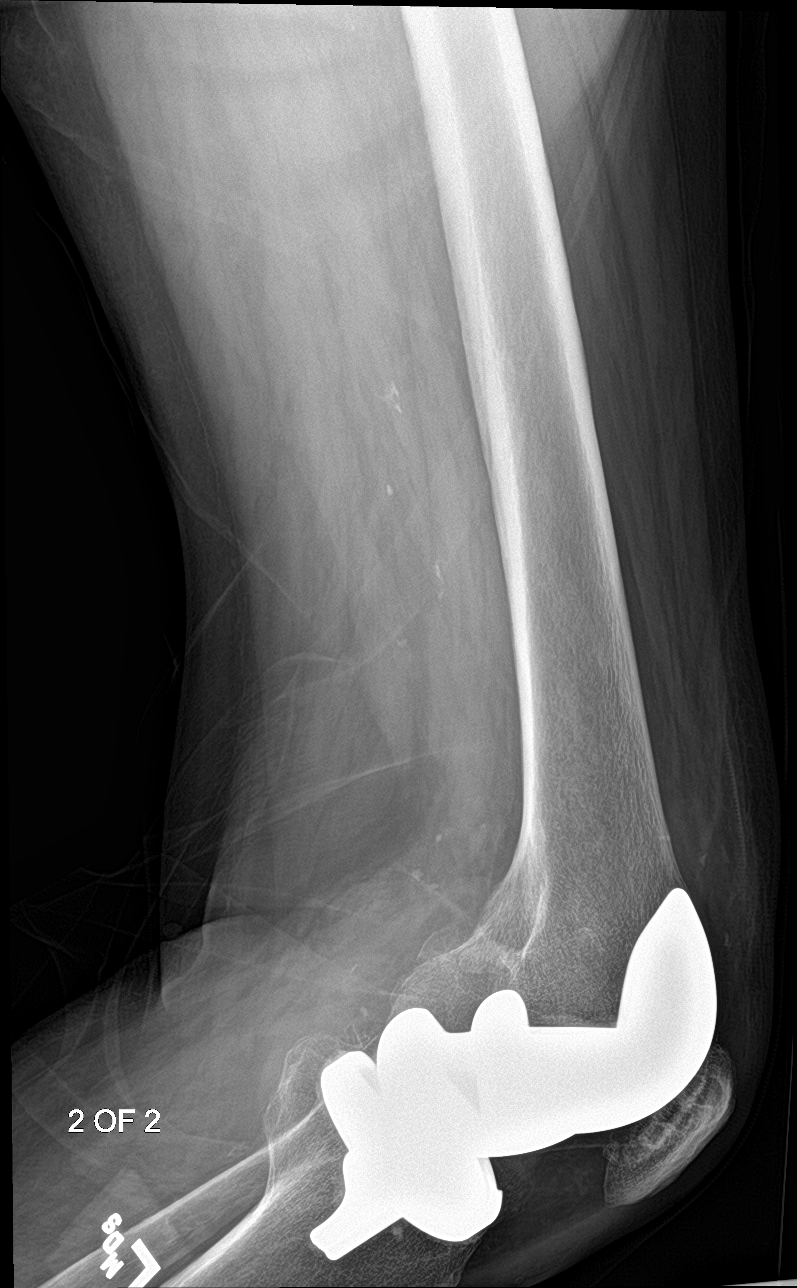

[5 of 5 positions shown; findings below may reference images not displayed]

FINDINGS: No fracture or dislocation. Mild degenerative change of the left hip
with joint space loss, subchondral sclerosis and osteophytosis. No
evidence of avascular necrosis.

Post left total knee replacement. No evidence of hardware failure or
loosening. No definite knee joint effusion.

Scattered adjacent vascular calcifications. No radiopaque foreign
body. Regional soft tissues appear otherwise normal.
IMPRESSION: Mild degenerative change of the left hip. Otherwise, no explanation
patient's left leg pain.

## 2020-06-10 DIAGNOSIS — H903 Sensorineural hearing loss, bilateral: Secondary | ICD-10-CM | POA: Insufficient documentation

## 2020-08-10 ENCOUNTER — Ambulatory Visit: Payer: Medicare Other | Admitting: Cardiovascular Disease

## 2020-08-10 ENCOUNTER — Other Ambulatory Visit: Payer: Self-pay

## 2020-08-10 ENCOUNTER — Encounter: Payer: Self-pay | Admitting: Cardiovascular Disease

## 2020-08-10 VITALS — BP 180/100 | HR 66 | Ht 72.0 in | Wt 216.1 lb

## 2020-08-10 DIAGNOSIS — I340 Nonrheumatic mitral (valve) insufficiency: Secondary | ICD-10-CM | POA: Diagnosis not present

## 2020-08-10 DIAGNOSIS — I5032 Chronic diastolic (congestive) heart failure: Secondary | ICD-10-CM | POA: Diagnosis not present

## 2020-08-10 DIAGNOSIS — I482 Chronic atrial fibrillation, unspecified: Secondary | ICD-10-CM

## 2020-08-10 DIAGNOSIS — I10 Essential (primary) hypertension: Secondary | ICD-10-CM | POA: Diagnosis not present

## 2020-08-10 MED ORDER — AMLODIPINE BESYLATE 2.5 MG PO TABS: 2.5000 mg | ORAL_TABLET | Freq: Every day | ORAL | 11 refills | Status: DC

## 2020-08-10 NOTE — Patient Instructions (Signed)
Medication Instructions:  Your physician has recommended you make the following change in your medication:   START Amlodipine 2.5 mg daily. An Rx has been sent to your pharmacy.  *If you need a refill on your cardiac medications before your next appointment, please call your pharmacy*   Lab Work: None ordered If you have labs (blood work) drawn today and your tests are completely normal, you will receive your results only by: Marland Kitchen MyChart Message (if you have MyChart) OR . A paper copy in the mail If you have any lab test that is abnormal or we need to change your treatment, we will call you to review the results.   Testing/Procedures: None ordered   Follow-Up: At [facility], you and your health needs are our priority.  As part of our continuing mission to provide you with exceptional heart care, we have created designated Provider Care Teams.  These Care Teams include your primary Cardiologist (physician) and Advanced Practice Providers (APPs -  Physician Assistants and Nurse Practitioners) who all work together to provide you with the care you need, when you need it.  We recommend signing up for the patient portal called "MyChart".  Sign up information is provided on this After Visit Summary.  MyChart is used to connect with patients for Virtual Visits (Telemedicine).  Patients are able to view lab/test results, encounter notes, upcoming appointments, etc.  Non-urgent messages can be sent to your provider as well.   To learn more about what you can do with MyChart, go to [email or web address].    Your next appointment:   6 month(s)  The format for your next appointment:   In Person  Provider:   You may see Lorine Bears, MD or one of the following Advanced Practice Providers on your designated Care Team:    Nicolasa Ducking, NP  Eula Listen, PA-C  Marisue Ivan, PA-C  Cadence Tomball, [place]  Gillian Shields, NP    Other Instructions N/A

## 2020-08-10 NOTE — Progress Notes (Signed)
Cardiology Office Note   Date:  08/10/2020   ID:  [name], DOB 24-Oct-1940, MRN [id number]  PCP:  Jerl Mina, MD  Cardiologist:   Lorine Bears, MD   Chief Complaint  Patient presents with  . OTHER    6 month f/u no complaints today. Meds reviewed verbally with pt.      History of Present Illness: [name] is a 79 y.o. male who presents for a follow-up visit regarding chronic atrial fibrillation.  He has history of small subarachnoid hemorrhage in 2010 in the setting of treatment with clopidogrel, previous stroke in December 2013, seizure disorder, hypertension, hyperlipidemia, and multiple falls. He has permanent atrial fibrillation being treated with rate control and anticoagulation.  Verapamil was actually discontinued due to prolonged pauses.  I was hospitalized in January of this year with extensive  left lower extremity DVT  and underwent catheter-based intervention in February.  He was not on anticoagulation before that.   He has been doing well with no chest pain, shortness of breath or palpitations.  No significant leg edema.   Past Medical History:  Diagnosis Date  . Arthritis    "lower back on down; into all joints; into my feet" (09/17/2012)  . Chronic low back pain    hx of  . Complication of anesthesia    "cardiac arrest with dye from lexiscan (04/15/10 records indicate bradycardia, no ischemia, EF 56%), unstable BP with anesthesia)  . GERD (gastroesophageal reflux disease)   . 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 Herniated disc, cervical   . Hypercholesteremia   . Hypertension    sees Dr. Jerl Mina, [facility] in [place]  . Intracranial bleed [facility])    december 2010  . Migraines    "2 in my life" (09/17/2012)  . Mitral regurgitation    a. 01/2019 Echo: EF 55-60%, mild conc LVH. RVSP 59.44mmHg. Mildly dil LA. Mild to mod MR. Mild AI.  Marland Kitchen PAH (pulmonary artery hypertension) (HCC)   . Permanent atrial  fibrillation (HCC)    a. CHA2DS2VASc = 5-->No OAC 2/2 h/o SAH.  Marland Kitchen Retina disorder    hx of torn retina  . Stroke (HCC) 2002   LLE "just a little bit weaker" (09/17/2012)  . Subarachnoid hemorrhage (HCC) 08/2009    Past Surgical History:  Procedure Laterality Date  . BLEPHAROPLASTY  1990's   "right eye" (09/16/2012)  . CARDIOVASCULAR STRESS TEST     nl perfusion w/o ischemia, EF 56%, bradycardia without syncope 04/15/10 (Dr. Harold Hedge)  . CATARACT EXTRACTION W/ INTRAOCULAR LENS IMPLANT  1980's   "right" (09/17/2012)  . COLONOSCOPY     06/26/09  . EYE MUSCLE SURGERY  1980's   "right eye" (09/17/2012)  . INGUINAL HERNIA REPAIR  04/10/2011   "right" (09/17/2012)  . JOINT REPLACEMENT    . PERIPHERAL VASCULAR THROMBECTOMY Left 10/27/2019   Procedure: PERIPHERAL VASCULAR THROMBECTOMY;  Surgeon: Annice Needy, MD;  Location: [facility] INVASIVE CV LAB;  Service: Cardiovascular;  Laterality: Left;  . POSTERIOR LUMBAR FUSION  09/16/2012   "L4-5" (09/17/2012)  . RETINAL DETACHMENT SURGERY  1980's   "right" (09/17/2012)  . TOTAL KNEE ARTHROPLASTY  04/28/2010   "left" (09/17/2012)     Current Outpatient Medications  Medication Sig Dispense Refill  . aliskiren (TEKTURNA) 300 MG tablet Take 300 mg by mouth daily.     Marland Kitchen amoxicillin (AMOXIL) 500 MG tablet Take 500 mg by mouth as directed. Dental procedure only    .  carbamazepine (TEGRETOL) 200 MG tablet Take 200 mg by mouth 2 (two) times a day.     . cetirizine (ZYRTEC) 10 MG tablet Take 10 mg by mouth daily as needed for allergies.     Marland Kitchen doxazosin (CARDURA) 1 MG tablet Take 1 mg by mouth at bedtime.     Marland Kitchen ELIQUIS 5 MG TABS tablet Take 5 mg by mouth 2 (two) times daily.    . furosemide (LASIX) 20 MG tablet Take 2 tablets (40 mg total) by mouth daily. 180 tablet 3  . irbesartan (AVAPRO) 300 MG tablet Take 300 mg by mouth daily.    . Omega-3 Fatty Acids (FISH OIL) 1200 MG CAPS Take 1,200 mg by mouth daily.    . pantoprazole (PROTONIX) 40 MG tablet  Take 40 mg by mouth at bedtime.     . simvastatin (ZOCOR) 20 MG tablet Take 20 mg by mouth 2 (two) times daily.     . vitamin B-12 (CYANOCOBALAMIN) 1000 MCG tablet Take 1,000 mcg by mouth daily.    Marland Kitchen amLODipine (NORVASC) 2.5 MG tablet Take 1 tablet (2.5 mg total) by mouth daily. 30 tablet 11   No current facility-administered medications for this visit.    Allergies:   Codeine, Fentanyl, Regadenoson, and Verapamil    Social History:  The patient  reports that he has never smoked. He has never used smokeless tobacco. He reports that he does not drink alcohol and does not use drugs.   Family History:  The patient's family history includes Heart disease in his mother.    ROS:  Please see the history of present illness.   Otherwise, review of systems are positive for none.   All other systems are reviewed and negative.    PHYSICAL EXAM: VS:  BP (!) 180/100 (BP Location: Left Arm, Patient Position: Sitting, Cuff Size: Normal)   Pulse 66   Ht 6' (1.829 m)   Wt 216 lb 2 oz (98 kg)   SpO2 98%   BMI 29.31 kg/m  , BMI Body mass index is 29.31 kg/m. GEN: Well nourished, well developed, in no acute distress  HEENT: normal  Neck: no JVD, carotid bruits, or masses Cardiac: Irregularly irregular; no murmurs, rubs, or gallops, mild swelling of left leg compared to the right Respiratory:  clear to auscultation bilaterally, normal work of breathing GI: soft, nontender, nondistended, + BS MS: no deformity or atrophy  Skin: warm and dry, no rash Neuro:  Strength and sensation are intact Psych: euthymic mood, full affect   EKG:  EKG is ordered today. The ekg ordered today demonstrates atrial fibrillation with a ventricular rate of 70 bpm, left axis deviation, borderline LVH.   Recent Labs: 10/27/2019: BUN 13; Creatinine, Ser 0.95    Lipid Panel No results found for: CHOL, TRIG, HDL, CHOLHDL, VLDL, LDLCALC, LDLDIRECT    Wt Readings from Last 3 Encounters:  08/10/20 216 lb 2 oz (98 kg)    02/10/20 217 lb 6.4 oz (98.6 kg)  02/03/20 219 lb 12 oz (99.7 kg)       PAD Screen 02/25/2019  Previous PAD dx? No  Previous surgical procedure? No  Pain with walking? Yes  Subsides with rest? Yes  Feet/toe relief with dangling? No  Painful, non-healing ulcers? No  Extremities discolored? No      ASSESSMENT AND PLAN:  1.  Chronic atrial fibrillation: Ventricular rate is controlled without medications.   CHADS2VASc 5 (HTN, age x 2, stroke x 2). He is tolerating anticoagulation with Eliquis  with no side effects.   2.  Essential hypertension: Blood pressure is elevated.  His blood pressure is not as high at home but still not in the optimal range.  I elected to add amlodipine 2.5 mg once daily.  3.  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   4. Mitral regurgitation: Mild to moderate on recent echo. Continue to monitor.  No murmurs by exam.   Disposition:   FU with me in 6 months  Signed,  Lorine Bears, MD  08/10/2020 2:37 PM    [facility]

## 2020-08-17 ENCOUNTER — Encounter (INDEPENDENT_AMBULATORY_CARE_PROVIDER_SITE_OTHER): Payer: Self-pay | Admitting: Nurse Practitioner

## 2020-08-17 ENCOUNTER — Other Ambulatory Visit: Payer: Self-pay

## 2020-08-17 ENCOUNTER — Ambulatory Visit (INDEPENDENT_AMBULATORY_CARE_PROVIDER_SITE_OTHER): Payer: Medicare Other | Admitting: Nurse Practitioner

## 2020-08-17 VITALS — BP 164/91 | HR 97 | Resp 16 | Wt 215.4 lb

## 2020-08-17 DIAGNOSIS — R600 Localized edema: Secondary | ICD-10-CM

## 2020-08-17 DIAGNOSIS — I82412 Acute embolism and thrombosis of left femoral vein: Secondary | ICD-10-CM | POA: Diagnosis not present

## 2020-08-17 DIAGNOSIS — I10 Essential (primary) hypertension: Secondary | ICD-10-CM

## 2020-08-22 ENCOUNTER — Encounter (INDEPENDENT_AMBULATORY_CARE_PROVIDER_SITE_OTHER): Payer: Self-pay | Admitting: Nurse Practitioner

## 2020-08-22 NOTE — Progress Notes (Signed)
Subjective:    Patient ID: [name], male    DOB: Feb 10, 1941, 79 y.o.   MRN: [id number] Chief Complaint  Patient presents with  . Follow-up    2month follow up    The patient returns to the office for followup evaluation regarding leg swelling.  The swelling has improved quite a bit and the pain associated with swelling has decreased substantially. There have not been any interval development of a ulcerations or wounds.  The patient previously had extensive lower extremity edema in his left lower extremity following a DVT however these have been controlled since that time.  Since the previous visit the patient has been wearing graduated compression stockings and has noted significant improvement in the lymphedema. The patient has been using compression routinely morning until night.  The patient also states elevation during the day and exercise is being done too.      Review of Systems  Cardiovascular: Negative for leg swelling.  Neurological: Positive for weakness.  All other systems reviewed and are negative.      Objective:   Physical Exam Vitals reviewed.  HENT:     Head: Normocephalic.  Cardiovascular:     Rate and Rhythm: Normal rate. Rhythm irregular.  Pulmonary:     Effort: Pulmonary effort is normal.  Musculoskeletal:     Left lower leg: No edema.  Skin:    General: Skin is warm and dry.  Neurological:     Mental Status: He is alert and oriented to person, place, and time.     Gait: Gait abnormal.  Psychiatric:        Mood and Affect: Mood normal.        Behavior: Behavior normal.        Thought Content: Thought content normal.        Judgment: Judgment normal.     BP (!) 164/91 (BP Location: Right Arm)   Pulse 97   Resp 16   Wt 215 lb 6.4 oz (97.7 kg)   BMI 29.21 kg/m   Past Medical History:  Diagnosis Date  . Arthritis    "lower back on down; into all joints; into my feet" (09/17/2012)  . Chronic low back pain    hx of  .  Complication of anesthesia    "cardiac arrest with dye from lexiscan (04/15/10 records indicate bradycardia, no ischemia, EF 56%), unstable BP with anesthesia)  . GERD (gastroesophageal reflux disease)   . 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 Herniated disc, cervical   . Hypercholesteremia   . Hypertension    sees Dr. Jerl Mina, [place] in [place]  . Intracranial bleed [facility])    december 2010  . Migraines    "2 in my life" (09/17/2012)  . Mitral regurgitation    a. 01/2019 Echo: EF 55-60%, mild conc LVH. RVSP 59.64mmHg. Mildly dil LA. Mild to mod MR. Mild AI.  Marland Kitchen PAH (pulmonary artery hypertension) (HCC)   . Permanent atrial fibrillation (HCC)    a. CHA2DS2VASc = 5-->No OAC 2/2 h/o SAH.  Marland Kitchen Retina disorder    hx of torn retina  . Stroke (HCC) 2002   LLE "just a little bit weaker" (09/17/2012)  . Subarachnoid hemorrhage (HCC) 08/2009    Social History   Socioeconomic History  . Marital status: Married    Spouse name: Not on file  . Number of children: Not on file  . Years of education: Not on file  . Highest education  level: Not on file  Occupational History  . Not on file  Tobacco Use  . Smoking status: Never Smoker  . Smokeless tobacco: Never Used  Vaping Use  . Vaping Use: Never used  Substance and Sexual Activity  . Alcohol use: No  . Drug use: No  . Sexual activity: Not Currently  Other Topics Concern  . Not on file  Social History Narrative   Lives at home with wife in private residence   Social Determinants of Health   Financial Resource Strain:   . Difficulty of Paying Living Expenses: Not on file  Food Insecurity:   . Worried About Programme researcher, broadcasting/film/video in the Last Year: Not on file  . Ran Out of Food in the Last Year: Not on file  Transportation Needs:   . Lack of Transportation (Medical): Not on file  . Lack of Transportation (Non-Medical): Not on file  Physical Activity:   . Days of Exercise per Week: Not on file  . Minutes  of Exercise per Session: Not on file  Stress:   . Feeling of Stress : Not on file  Social Connections:   . Frequency of Communication with Friends and Family: Not on file  . Frequency of Social Gatherings with Friends and Family: Not on file  . Attends Religious Services: Not on file  . Active Member of Clubs or Organizations: Not on file  . Attends Banker Meetings: Not on file  . Marital Status: Not on file  Intimate Partner Violence:   . Fear of Current or Ex-Partner: Not on file  . Emotionally Abused: Not on file  . Physically Abused: Not on file  . Sexually Abused: Not on file    Past Surgical History:  Procedure Laterality Date  . BLEPHAROPLASTY  1990's   "right eye" (09/16/2012)  . CARDIOVASCULAR STRESS TEST     nl perfusion w/o ischemia, EF 56%, bradycardia without syncope 04/15/10 (Dr. Harold Hedge)  . CATARACT EXTRACTION W/ INTRAOCULAR LENS IMPLANT  1980's   "right" (09/17/2012)  . COLONOSCOPY     06/26/09  . EYE MUSCLE SURGERY  1980's   "right eye" (09/17/2012)  . INGUINAL HERNIA REPAIR  04/10/2011   "right" (09/17/2012)  . JOINT REPLACEMENT    . PERIPHERAL VASCULAR THROMBECTOMY Left 10/27/2019   Procedure: PERIPHERAL VASCULAR THROMBECTOMY;  Surgeon: Annice Needy, MD;  Location: [facility] INVASIVE CV LAB;  Service: Cardiovascular;  Laterality: Left;  . POSTERIOR LUMBAR FUSION  09/16/2012   "L4-5" (09/17/2012)  . RETINAL DETACHMENT SURGERY  1980's   "right" (09/17/2012)  . TOTAL KNEE ARTHROPLASTY  04/28/2010   "left" (09/17/2012)    Family History  Problem Relation Age of Onset  . Heart disease Mother     Allergies  Allergen Reactions  . Codeine Nausea Only and Other (See Comments)  . Fentanyl Other (See Comments)    Given before knee surgery, blood pressure became extremely low  . Regadenoson Other (See Comments)    Created cardiac arrest  . Verapamil Other (See Comments)    Per patient: Medication has caused low heart rate and caused patient to have  afib that he currently has    CBC Latest Ref Rng & Units 02/18/2019 02/17/2019 01/30/2019  WBC 4.0 - 10.5 K/uL 9.9 10.7(H) 8.2  Hemoglobin 13.0 - 17.0 g/dL 41.9 37.9 02.4  Hematocrit 39 - 52 % 41.1 41.8 39.8  Platelets 150 - 400 K/uL 192 172 163      CMP  Component Value Date/Time   NA 138 05/28/2019 1121   K 4.3 05/28/2019 1121   CL 100 05/28/2019 1121   CO2 29 05/28/2019 1121   GLUCOSE 93 05/28/2019 1121   BUN 13 10/27/2019 0736   CREATININE 0.95 10/27/2019 0736   CALCIUM 9.5 05/28/2019 1121   PROT 6.6 06/24/2019 1452   PROT 6.5 06/24/2019 1452   ALBUMIN 3.7 01/30/2019 0830   AST 21 01/30/2019 0830   ALT 17 01/30/2019 0830   ALKPHOS 82 01/30/2019 0830   BILITOT 0.8 01/30/2019 0830   GFRNONAA >60 10/27/2019 0736   GFRAA >60 10/27/2019 0736     No results found.     Assessment & Plan:   1. Acute deep vein thrombosis (DVT) of femoral vein of left lower extremity (HCC) At this time the patient does not have any postphlebitic symptoms or any evidence of extremity swelling.  The patient has continued to adhere to conservative therapy which is likely contributed to his good control of edema.  The patient will remain anticoagulated as he is chronically anticoagulated due to his atrial fibrillation.  We will have the patient follow-up on an as-needed basis in regards to his lower extremity edema or possible further recurrence of DVT.  2. Leg edema, left Patient will continue to adhere to conservative therapy including use of medical grade 1 compression, elevation and exercise when possible.  We will see the patient back on an as-needed basis.  3. Essential hypertension Continue antihypertensive medications as already ordered, these medications have been reviewed and there are no changes at this time.    Current Outpatient Medications on File Prior to Visit  Medication Sig Dispense Refill  . aliskiren (TEKTURNA) 300 MG tablet Take 300 mg by mouth daily.     Marland Kitchen amLODipine  (NORVASC) 2.5 MG tablet Take 1 tablet (2.5 mg total) by mouth daily. 30 tablet 11  . amoxicillin (AMOXIL) 500 MG tablet Take 500 mg by mouth as directed. Dental procedure only    . carbamazepine (TEGRETOL) 200 MG tablet Take 200 mg by mouth 2 (two) times a day.     . doxazosin (CARDURA) 1 MG tablet Take 1 mg by mouth at bedtime.     Marland Kitchen ELIQUIS 5 MG TABS tablet Take 5 mg by mouth 2 (two) times daily.    . furosemide (LASIX) 20 MG tablet Take 2 tablets (40 mg total) by mouth daily. 180 tablet 3  . irbesartan (AVAPRO) 300 MG tablet Take 300 mg by mouth daily.    . Omega-3 Fatty Acids (FISH OIL) 1200 MG CAPS Take 1,200 mg by mouth daily.    . pantoprazole (PROTONIX) 40 MG tablet Take 40 mg by mouth at bedtime.     . simvastatin (ZOCOR) 20 MG tablet Take 20 mg by mouth 2 (two) times daily.     . vitamin B-12 (CYANOCOBALAMIN) 1000 MCG tablet Take 1,000 mcg by mouth daily.    . cetirizine (ZYRTEC) 10 MG tablet Take 10 mg by mouth daily as needed for allergies.      No current facility-administered medications on file prior to visit.    There are no Patient Instructions on file for this visit. No follow-ups on file.   Georgiana Spinner, NP

## 2021-02-08 ENCOUNTER — Ambulatory Visit: Payer: Medicare Other | Admitting: Cardiovascular Disease

## 2021-02-09 ENCOUNTER — Encounter: Payer: Self-pay | Admitting: Nurse Practitioner

## 2021-02-09 NOTE — Progress Notes (Signed)
Office Visit    Patient Name: [name] Date of Encounter: 02/10/2021  Primary Care Provider:  Jerl Mina, MD Primary Cardiologist:  Lorine Bears, MD  Chief Complaint    80 y/o ? w/ a h/o HTN, HL, stroke, subarachnoid hemorrhage (2010), seizures, gait instability, falls, LLE DVT (09/2019), and permanent afib, who presents for f/u related to afib.  Past Medical History    Past Medical History:  Diagnosis Date  . Arthritis    "lower back on down; into all joints; into my feet" (09/17/2012)  . Chronic low back pain    hx of  . Complication of anesthesia    "cardiac arrest with dye from lexiscan (04/15/10 records indicate bradycardia, no ischemia, EF 56%), unstable BP with anesthesia)  . GERD (gastroesophageal reflux disease)   . 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 Herniated disc, cervical   . Hypercholesteremia   . Hypertension    sees Dr. Jerl Mina, [place] [facility] in [place]  . Intracranial bleed [facility])    december 2010  . Left leg DVT (HCC) 09/2019  . Migraines    "2 in my life" (09/17/2012)  . Mitral regurgitation    a. 01/2019 Echo: EF 55-60%, mild conc LVH. RVSP 59.23mmHg. Mildly dil LA. Mild to mod MR. Mild AI.  Marland Kitchen PAH (pulmonary artery hypertension) (HCC)   . Permanent atrial fibrillation (HCC)    a. CHA2DS2VASc = 5-->No OAC 2/2 h/o SAH; b. 03/2019 Zio: permanent Afib - 78 bpm (46-135). Occas PVCs (1.4% burden).  . Retina disorder    hx of torn retina  . Stroke (HCC) 2002   LLE "just a little bit weaker" (09/17/2012)  . Subarachnoid hemorrhage (HCC) 08/2009   Past Surgical History:  Procedure Laterality Date  . BLEPHAROPLASTY  1990's   "right eye" (09/16/2012)  . CARDIOVASCULAR STRESS TEST     nl perfusion w/o ischemia, EF 56%, bradycardia without syncope 04/15/10 (Dr. Harold Hedge)  . CATARACT EXTRACTION W/ INTRAOCULAR LENS IMPLANT  1980's   "right" (09/17/2012)  . COLONOSCOPY     06/26/09  . EYE MUSCLE SURGERY  1980's    "right eye" (09/17/2012)  . INGUINAL HERNIA REPAIR  04/10/2011   "right" (09/17/2012)  . JOINT REPLACEMENT    . PERIPHERAL VASCULAR THROMBECTOMY Left 10/27/2019   Procedure: PERIPHERAL VASCULAR THROMBECTOMY;  Surgeon: Annice Needy, MD;  Location: [facility] INVASIVE CV LAB;  Service: Cardiovascular;  Laterality: Left;  . POSTERIOR LUMBAR FUSION  09/16/2012   "L4-5" (09/17/2012)  . RETINAL DETACHMENT SURGERY  1980's   "right" (09/17/2012)  . TOTAL KNEE ARTHROPLASTY  04/28/2010   "left" (09/17/2012)    Allergies  Allergies  Allergen Reactions  . Codeine Nausea Only and Other (See Comments)  . Fentanyl Other (See Comments)    Given before knee surgery, blood pressure became extremely low  . Regadenoson Other (See Comments)    Created cardiac arrest  . Verapamil Other (See Comments)    Per patient: Medication has caused low heart rate and caused patient to have afib that he currently has    History of Present Illness    80 y/o ? with the above complex past medical history including hypertension, hyperlipidemia, prior stroke (08/2012), subarachnoid hemorrhage (2010) in the setting of treatment with clopidogrel, seizures, gait instability and frequent falls, and permanent afib.  In May 2020, he had multiple ER visits in the setting of weakness and falls.  He was found to have a large tear  of the left superior labrum and MRI of the spine showed a large L2-3 disc extrusion.  During one of his admissions in May 2020, he was found to be in atrial fibrillation with rapid ventricular response.  Echocardiogram showed normal LV function with moderate pulmonary hypertension, mild to moderate mitral regurgitation, and mild aortic insufficiency.  He was initially on verapamil therapy for rate control however, this was discontinued in the setting of pauses up to 2.7 seconds at night.  Follow-up Zio patch showed chronic A. fib with an average heart rate of 70 bpm.  He was not anticoagulated in the setting of prior  subarachnoid hemorrhage however, in January 2021, he was admitted with extensive lower extremity DVT and underwent catheter-based intervention in February 2021.  He has been on Eliquis since.  He was last seen in cardiology clinic in November 2021, at which time he was doing reasonably well.  Blood pressure was elevated and amlodipine 2.5 mg daily was added.  Since then, he has been doing well.  He has noted significant improvement in left lower extremity swelling.  Blood pressures have been trending in the 120s to low 130s at home.  He is using a recumbent bicycle for 30 to 60 minutes daily without symptoms or limitations.  He otherwise ambulates with a walker in the setting of ongoing unsteady gait and poor flexibility in his ankles.  He denies chest pain, dyspnea, palpitations, PND, orthopnea, dizziness, syncope, or early satiety.  Home Medications    Prior to Admission medications   Medication Sig Start Date End Date Taking? Authorizing Provider  aliskiren (TEKTURNA) 300 MG tablet Take 300 mg by mouth daily.     [provider]  amLODipine (NORVASC) 2.5 MG tablet Take 1 tablet (2.5 mg total) by mouth daily. 08/10/20 11/08/20  Iran Ouch, MD  amoxicillin (AMOXIL) 500 MG tablet Take 500 mg by mouth as directed. Dental procedure only    [provider]  carbamazepine (TEGRETOL) 200 MG tablet Take 200 mg by mouth 2 (two) times a day.     [provider]  cetirizine (ZYRTEC) 10 MG tablet Take 10 mg by mouth daily as needed for allergies.     [provider]  doxazosin (CARDURA) 1 MG tablet Take 1 mg by mouth at bedtime.     [provider]  ELIQUIS 5 MG TABS tablet Take 5 mg by mouth 2 (two) times daily. 10/06/19   [provider]  furosemide (LASIX) 20 MG tablet Take 2 tablets (40 mg total) by mouth daily. 05/27/19   Creig Hines, NP  irbesartan (AVAPRO) 300 MG tablet Take 300 mg by mouth daily. 11/19/17   [provider]   Omega-3 Fatty Acids (FISH OIL) 1200 MG CAPS Take 1,200 mg by mouth daily.    [provider]  pantoprazole (PROTONIX) 40 MG tablet Take 40 mg by mouth at bedtime.     [provider]  simvastatin (ZOCOR) 20 MG tablet Take 20 mg by mouth 2 (two) times daily.     [provider]  vitamin B-12 (CYANOCOBALAMIN) 1000 MCG tablet Take 1,000 mcg by mouth daily.    [provider]    Review of Systems    Unsteady gait and stiff ankles.  Occasional mild left lower extremity swelling though overall much improved.  He denies chest pain, dyspnea, palpitations, PND, orthopnea, dizziness, syncope, or early satiety.  All other systems reviewed and are otherwise negative except as noted above.  Physical Exam  VS:  BP 136/74   Pulse 76   Ht 6' (1.829 m)   Wt 219 lb (99.3 kg)   BMI 29.70 kg/m  , BMI Body mass index is 29.7 kg/m. GEN: Well nourished, well developed, in no acute distress. HEENT: normal. Neck: Supple, no JVD, carotid bruits, or masses. Cardiac: Irregularly, irregular, no murmurs, rubs, or gallops. No clubbing, cyanosis, edema.  Radials/PT 2+ and equal bilaterally.  Respiratory:  Respirations regular and unlabored, clear to auscultation bilaterally. GI: Soft, nontender, nondistended, BS + x 4. MS: no deformity or atrophy. Skin: warm and dry, no rash. Neuro:  Strength and sensation are intact. Psych: Normal affect.  Accessory Clinical Findings    ECG personally reviewed by me today -atrial fibrillation 75, left axis deviation, LVH, ? Prior septal infarct - no acute changes.  Labs dated September 27, 2020 (in Care Everywhere)  Hemoglobin 12.6, hematocrit 39.5, WBC 6.7, platelets 169 Sodium 140, potassium 4.0, chloride 103, CO2 26.9, BUN 10, creatinine 0.9, glucose 106 Calcium 9.3, albumin 4.1, total protein 6.3 Total bilirubin 0.3, alkaline phosphatase 101, AST 14, ALT 6 Total cholesterol 133, triglycerides 136, HDL 42.7, LDL 63 PSA  1.02  Assessment & Plan    1.  Permanent atrial fibrillation: Well rate controlled.  He is not on any AV nodal blocking agents secondary to prior history of bradycardia on verapamil.  He is completely asymptomatic.  He remains on Eliquis therapy in the setting of a CHA2DS2-VASc of 5.  Available labs reviewed today.  Stable H&H and renal function in January.  Due for follow-up labs with primary care in June.  2.  Essential hypertension: Much improved on amlodipine therapy with pressures trending in the 120s to 130s at home.  136/74 today, which she notes is on the high end for him.  He remains on amlodipine, irbesartan, aliskiren, and furosemide.  3.  Hyperlipidemia: LDL 63 in January.  He remains on simvastatin therapy.  4.  History of left lower extremity DVT: No significant lower extremity swelling today.  Remains on Eliquis.  Followed by vascular surgery.  5.  History of stroke: Unsteady gait but uses recumbent bicycle for 30 to 60 minutes daily to improve leg strength.  Using walker to get around.  Remains on Eliquis in the setting of atrial fibrillation.  6.  Disposition: Follow-up in clinic in 6 months or sooner if necessary.   Nicolasa Ducking, NP 02/10/2021, 10:22 AM

## 2021-02-10 ENCOUNTER — Other Ambulatory Visit: Payer: Self-pay

## 2021-02-10 ENCOUNTER — Ambulatory Visit: Payer: Medicare Other | Admitting: Nurse Practitioner

## 2021-02-10 ENCOUNTER — Encounter: Payer: Self-pay | Admitting: Nurse Practitioner

## 2021-02-10 VITALS — BP 136/74 | HR 76 | Ht 72.0 in | Wt 219.0 lb

## 2021-02-10 DIAGNOSIS — I4821 Permanent atrial fibrillation: Secondary | ICD-10-CM

## 2021-02-10 DIAGNOSIS — I10 Essential (primary) hypertension: Secondary | ICD-10-CM

## 2021-02-10 DIAGNOSIS — E782 Mixed hyperlipidemia: Secondary | ICD-10-CM | POA: Diagnosis not present

## 2021-02-10 NOTE — Patient Instructions (Signed)
Medication Instructions:  ?No changes at this time.  ? ?*If you need a refill on your cardiac medications before your next appointment, please call your pharmacy* ? ? ?Lab Work: ?None ? ?If you have labs (blood work) drawn today and your tests are completely normal, you will receive your results only by: ?MyChart Message (if you have MyChart) OR ?A paper copy in the mail ?If you have any lab test that is abnormal or we need to change your treatment, we will call you to review the results. ? ? ?Testing/Procedures: ?None ? ? ?Follow-Up: ?At [facility], you and your health needs are our priority.  As part of our continuing mission to provide you with exceptional heart care, we have created designated Provider Care Teams.  These Care Teams include your primary Cardiologist (physician) and Advanced Practice Providers (APPs -  Physician Assistants and Nurse Practitioners) who all work together to provide you with the care you need, when you need it. ? ? ?Your next appointment:   ?6 month(s) ? ?The format for your next appointment:   ?In Person ? ?Provider:   ?Muhammad Arida, MD or Christopher Berge, NP ?

## 2021-04-06 DIAGNOSIS — I5032 Chronic diastolic (congestive) heart failure: Secondary | ICD-10-CM | POA: Diagnosis not present

## 2021-04-06 DIAGNOSIS — I110 Hypertensive heart disease with heart failure: Secondary | ICD-10-CM | POA: Diagnosis not present

## 2021-04-06 DIAGNOSIS — E785 Hyperlipidemia, unspecified: Secondary | ICD-10-CM | POA: Diagnosis not present

## 2021-04-06 DIAGNOSIS — I4821 Permanent atrial fibrillation: Secondary | ICD-10-CM | POA: Diagnosis not present

## 2021-08-16 ENCOUNTER — Other Ambulatory Visit: Payer: Self-pay

## 2021-08-16 ENCOUNTER — Emergency Department: Payer: Medicare Other

## 2021-08-16 ENCOUNTER — Inpatient Hospital Stay
Admission: EM | Admit: 2021-08-16 | Discharge: 2021-08-23 | DRG: 493 | Disposition: A | Discharge status: Skilled Nursing Facility | Payer: Medicare Other | Attending: Internal Medicine | Admitting: Internal Medicine

## 2021-08-16 DIAGNOSIS — G629 Polyneuropathy, unspecified: Secondary | ICD-10-CM | POA: Diagnosis present

## 2021-08-16 DIAGNOSIS — G8929 Other chronic pain: Secondary | ICD-10-CM | POA: Diagnosis present

## 2021-08-16 DIAGNOSIS — W1830XA Fall on same level, unspecified, initial encounter: Secondary | ICD-10-CM | POA: Diagnosis present

## 2021-08-16 DIAGNOSIS — D62 Acute posthemorrhagic anemia: Secondary | ICD-10-CM

## 2021-08-16 DIAGNOSIS — D259 Leiomyoma of uterus, unspecified: Secondary | ICD-10-CM | POA: Diagnosis not present

## 2021-08-16 DIAGNOSIS — E669 Obesity, unspecified: Secondary | ICD-10-CM | POA: Diagnosis not present

## 2021-08-16 DIAGNOSIS — Z8674 Personal history of sudden cardiac arrest: Secondary | ICD-10-CM

## 2021-08-16 DIAGNOSIS — R0902 Hypoxemia: Secondary | ICD-10-CM | POA: Diagnosis not present

## 2021-08-16 DIAGNOSIS — I517 Cardiomegaly: Secondary | ICD-10-CM | POA: Diagnosis not present

## 2021-08-16 DIAGNOSIS — M6281 Muscle weakness (generalized): Secondary | ICD-10-CM | POA: Diagnosis not present

## 2021-08-16 DIAGNOSIS — Z888 Allergy status to other drugs, medicaments and biological substances status: Secondary | ICD-10-CM

## 2021-08-16 DIAGNOSIS — S82892A Other fracture of left lower leg, initial encounter for closed fracture: Secondary | ICD-10-CM | POA: Diagnosis not present

## 2021-08-16 DIAGNOSIS — E785 Hyperlipidemia, unspecified: Secondary | ICD-10-CM | POA: Diagnosis not present

## 2021-08-16 DIAGNOSIS — K5900 Constipation, unspecified: Secondary | ICD-10-CM | POA: Diagnosis present

## 2021-08-16 DIAGNOSIS — K922 Gastrointestinal hemorrhage, unspecified: Secondary | ICD-10-CM | POA: Diagnosis not present

## 2021-08-16 DIAGNOSIS — Z7901 Long term (current) use of anticoagulants: Secondary | ICD-10-CM

## 2021-08-16 DIAGNOSIS — R600 Localized edema: Secondary | ICD-10-CM | POA: Diagnosis not present

## 2021-08-16 DIAGNOSIS — M62838 Other muscle spasm: Secondary | ICD-10-CM | POA: Diagnosis present

## 2021-08-16 DIAGNOSIS — M545 Low back pain, unspecified: Secondary | ICD-10-CM | POA: Diagnosis present

## 2021-08-16 DIAGNOSIS — R110 Nausea: Secondary | ICD-10-CM | POA: Diagnosis not present

## 2021-08-16 DIAGNOSIS — S82842A Displaced bimalleolar fracture of left lower leg, initial encounter for closed fracture: Principal | ICD-10-CM

## 2021-08-16 DIAGNOSIS — W19XXXA Unspecified fall, initial encounter: Secondary | ICD-10-CM | POA: Diagnosis not present

## 2021-08-16 DIAGNOSIS — R5381 Other malaise: Secondary | ICD-10-CM | POA: Diagnosis not present

## 2021-08-16 DIAGNOSIS — E538 Deficiency of other specified B group vitamins: Secondary | ICD-10-CM | POA: Diagnosis present

## 2021-08-16 DIAGNOSIS — D649 Anemia, unspecified: Secondary | ICD-10-CM | POA: Diagnosis not present

## 2021-08-16 DIAGNOSIS — M542 Cervicalgia: Secondary | ICD-10-CM | POA: Diagnosis not present

## 2021-08-16 DIAGNOSIS — Z6830 Body mass index (BMI) 30.0-30.9, adult: Secondary | ICD-10-CM | POA: Diagnosis not present

## 2021-08-16 DIAGNOSIS — I4821 Permanent atrial fibrillation: Secondary | ICD-10-CM | POA: Diagnosis not present

## 2021-08-16 DIAGNOSIS — R195 Other fecal abnormalities: Secondary | ICD-10-CM | POA: Diagnosis not present

## 2021-08-16 DIAGNOSIS — S8252XA Displaced fracture of medial malleolus of left tibia, initial encounter for closed fracture: Secondary | ICD-10-CM | POA: Diagnosis not present

## 2021-08-16 DIAGNOSIS — Z8673 Personal history of transient ischemic attack (TIA), and cerebral infarction without residual deficits: Secondary | ICD-10-CM | POA: Diagnosis not present

## 2021-08-16 DIAGNOSIS — M1990 Unspecified osteoarthritis, unspecified site: Secondary | ICD-10-CM | POA: Diagnosis present

## 2021-08-16 DIAGNOSIS — Z86718 Personal history of other venous thrombosis and embolism: Secondary | ICD-10-CM

## 2021-08-16 DIAGNOSIS — E7800 Pure hypercholesterolemia, unspecified: Secondary | ICD-10-CM | POA: Diagnosis not present

## 2021-08-16 DIAGNOSIS — K219 Gastro-esophageal reflux disease without esophagitis: Secondary | ICD-10-CM | POA: Diagnosis present

## 2021-08-16 DIAGNOSIS — G43909 Migraine, unspecified, not intractable, without status migrainosus: Secondary | ICD-10-CM | POA: Diagnosis not present

## 2021-08-16 DIAGNOSIS — Z885 Allergy status to narcotic agent status: Secondary | ICD-10-CM

## 2021-08-16 DIAGNOSIS — I2721 Secondary pulmonary arterial hypertension: Secondary | ICD-10-CM | POA: Diagnosis present

## 2021-08-16 DIAGNOSIS — S9302XA Subluxation of left ankle joint, initial encounter: Secondary | ICD-10-CM | POA: Diagnosis present

## 2021-08-16 DIAGNOSIS — I493 Ventricular premature depolarization: Secondary | ICD-10-CM | POA: Diagnosis present

## 2021-08-16 DIAGNOSIS — Z043 Encounter for examination and observation following other accident: Secondary | ICD-10-CM | POA: Diagnosis not present

## 2021-08-16 DIAGNOSIS — N200 Calculus of kidney: Secondary | ICD-10-CM | POA: Diagnosis not present

## 2021-08-16 DIAGNOSIS — M5020 Other cervical disc displacement, unspecified cervical region: Secondary | ICD-10-CM | POA: Diagnosis not present

## 2021-08-16 DIAGNOSIS — R531 Weakness: Secondary | ICD-10-CM | POA: Diagnosis not present

## 2021-08-16 DIAGNOSIS — Z20822 Contact with and (suspected) exposure to covid-19: Secondary | ICD-10-CM | POA: Diagnosis present

## 2021-08-16 DIAGNOSIS — I10 Essential (primary) hypertension: Secondary | ICD-10-CM | POA: Diagnosis not present

## 2021-08-16 DIAGNOSIS — Z8249 Family history of ischemic heart disease and other diseases of the circulatory system: Secondary | ICD-10-CM | POA: Diagnosis not present

## 2021-08-16 DIAGNOSIS — S82832A Other fracture of upper and lower end of left fibula, initial encounter for closed fracture: Secondary | ICD-10-CM | POA: Diagnosis not present

## 2021-08-16 DIAGNOSIS — R55 Syncope and collapse: Secondary | ICD-10-CM | POA: Diagnosis present

## 2021-08-16 DIAGNOSIS — R2689 Other abnormalities of gait and mobility: Secondary | ICD-10-CM | POA: Diagnosis not present

## 2021-08-16 DIAGNOSIS — S0990XA Unspecified injury of head, initial encounter: Secondary | ICD-10-CM | POA: Diagnosis not present

## 2021-08-16 DIAGNOSIS — R278 Other lack of coordination: Secondary | ICD-10-CM | POA: Diagnosis not present

## 2021-08-16 DIAGNOSIS — R9431 Abnormal electrocardiogram [ECG] [EKG]: Secondary | ICD-10-CM | POA: Diagnosis not present

## 2021-08-16 DIAGNOSIS — Z7401 Bed confinement status: Secondary | ICD-10-CM | POA: Diagnosis not present

## 2021-08-16 DIAGNOSIS — T4145XA Adverse effect of unspecified anesthetic, initial encounter: Secondary | ICD-10-CM | POA: Diagnosis present

## 2021-08-16 DIAGNOSIS — R208 Other disturbances of skin sensation: Secondary | ICD-10-CM | POA: Diagnosis not present

## 2021-08-16 DIAGNOSIS — R42 Dizziness and giddiness: Secondary | ICD-10-CM | POA: Diagnosis not present

## 2021-08-16 DIAGNOSIS — J439 Emphysema, unspecified: Secondary | ICD-10-CM | POA: Diagnosis not present

## 2021-08-16 DIAGNOSIS — Z79899 Other long term (current) drug therapy: Secondary | ICD-10-CM

## 2021-08-16 DIAGNOSIS — S82842D Displaced bimalleolar fracture of left lower leg, subsequent encounter for closed fracture with routine healing: Secondary | ICD-10-CM | POA: Diagnosis not present

## 2021-08-16 DIAGNOSIS — S82202A Unspecified fracture of shaft of left tibia, initial encounter for closed fracture: Secondary | ICD-10-CM

## 2021-08-16 LAB — PROTIME-INR
INR: 1.4 — ABNORMAL HIGH (ref 0.8–1.2)
Prothrombin Time: 17 seconds — ABNORMAL HIGH (ref 11.4–15.2)

## 2021-08-16 LAB — CBC WITH DIFFERENTIAL/PLATELET
Abs Immature Granulocytes: 0.03 10*3/uL (ref 0.00–0.07)
Basophils Absolute: 0 10*3/uL (ref 0.0–0.1)
Basophils Relative: 0 %
Eosinophils Absolute: 0.1 10*3/uL (ref 0.0–0.5)
Eosinophils Relative: 1 %
HCT: 25.3 % — ABNORMAL LOW (ref 39.0–52.0)
Hemoglobin: 7.3 g/dL — ABNORMAL LOW (ref 13.0–17.0)
Immature Granulocytes: 0 %
Lymphocytes Relative: 9 %
Lymphs Abs: 0.7 10*3/uL (ref 0.7–4.0)
MCH: 21.9 pg — ABNORMAL LOW (ref 26.0–34.0)
MCHC: 28.9 g/dL — ABNORMAL LOW (ref 30.0–36.0)
MCV: 75.7 fL — ABNORMAL LOW (ref 80.0–100.0)
Monocytes Absolute: 0.4 10*3/uL (ref 0.1–1.0)
Monocytes Relative: 6 %
Neutro Abs: 6.2 10*3/uL (ref 1.7–7.7)
Neutrophils Relative %: 84 %
Platelets: 182 10*3/uL (ref 150–400)
RBC: 3.34 MIL/uL — ABNORMAL LOW (ref 4.22–5.81)
RDW: 17.1 % — ABNORMAL HIGH (ref 11.5–15.5)
WBC: 7.4 10*3/uL (ref 4.0–10.5)
nRBC: 0 % (ref 0.0–0.2)

## 2021-08-16 LAB — BASIC METABOLIC PANEL
Anion gap: 5 (ref 5–15)
BUN: 23 mg/dL (ref 8–23)
CO2: 26 mmol/L (ref 22–32)
Calcium: 8.8 mg/dL — ABNORMAL LOW (ref 8.9–10.3)
Chloride: 105 mmol/L (ref 98–111)
Creatinine, Ser: 1.25 mg/dL — ABNORMAL HIGH (ref 0.61–1.24)
GFR, Estimated: 58 mL/min — ABNORMAL LOW (ref >60)
Glucose, Bld: 120 mg/dL — ABNORMAL HIGH (ref 70–99)
Potassium: 3.9 mmol/L (ref 3.5–5.1)
Sodium: 136 mmol/L (ref 135–145)

## 2021-08-16 LAB — RESP PANEL BY RT-PCR (FLU A&B, COVID) ARPGX2
Influenza A by PCR: NEGATIVE
Influenza B by PCR: NEGATIVE
SARS Coronavirus 2 by RT PCR: NEGATIVE

## 2021-08-16 LAB — HEMOGLOBIN AND HEMATOCRIT, BLOOD
HCT: 25.6 % — ABNORMAL LOW (ref 39.0–52.0)
Hemoglobin: 7.5 g/dL — ABNORMAL LOW (ref 13.0–17.0)

## 2021-08-16 LAB — TROPONIN I (HIGH SENSITIVITY)
Troponin I (High Sensitivity): 12 ng/L (ref <18)
Troponin I (High Sensitivity): 14 ng/L (ref <18)

## 2021-08-16 LAB — CBG MONITORING, ED: Glucose-Capillary: 116 mg/dL — ABNORMAL HIGH (ref 70–99)

## 2021-08-16 LAB — PREPARE RBC (CROSSMATCH)

## 2021-08-16 LAB — HEPATIC FUNCTION PANEL
ALT: 9 U/L (ref 0–44)
AST: 16 U/L (ref 15–41)
Albumin: 3.8 g/dL (ref 3.5–5.0)
Alkaline Phosphatase: 59 U/L (ref 38–126)
Bilirubin, Direct: <0.1 mg/dL (ref 0.0–0.2)
Total Bilirubin: 0.6 mg/dL (ref 0.3–1.2)
Total Protein: 6.7 g/dL (ref 6.5–8.1)

## 2021-08-16 LAB — APTT: aPTT: 23 seconds — ABNORMAL LOW (ref 24–36)

## 2021-08-16 MED ORDER — HYDROMORPHONE HCL 1 MG/ML IJ SOLN
0.5000 mg | Freq: Once | INTRAMUSCULAR | Status: AC
  Administered 2021-08-16: 0.5 mg via INTRAVENOUS
  Filled 2021-08-16: qty 1

## 2021-08-16 MED ORDER — PANTOPRAZOLE INFUSION (NEW) - SIMPLE MED
8.0000 mg/h | INTRAVENOUS | Status: AC
  Administered 2021-08-17 – 2021-08-19 (×4): 8 mg/h via INTRAVENOUS
  Filled 2021-08-16 (×6): qty 100
  Filled 2021-08-16: qty 80

## 2021-08-16 MED ORDER — ONDANSETRON HCL 4 MG/2ML IJ SOLN: 4.0000 mg | Freq: Four times a day (QID) | INTRAMUSCULAR | Status: DC | PRN

## 2021-08-16 MED ORDER — PANTOPRAZOLE SODIUM 40 MG IV SOLR
40.0000 mg | Freq: Two times a day (BID) | INTRAVENOUS | Status: DC
  Administered 2021-08-20 – 2021-08-21 (×3): 40 mg via INTRAVENOUS
  Filled 2021-08-16 (×3): qty 40

## 2021-08-16 MED ORDER — PANTOPRAZOLE 80MG IVPB - SIMPLE MED
80.0000 mg | Freq: Once | INTRAVENOUS | Status: AC
  Administered 2021-08-17: 80 mg via INTRAVENOUS
  Filled 2021-08-16: qty 80

## 2021-08-16 MED ORDER — ACETAMINOPHEN 325 MG PO TABS
650.0000 mg | ORAL_TABLET | Freq: Four times a day (QID) | ORAL | Status: DC | PRN
  Administered 2021-08-18 (×2): 650 mg via ORAL
  Filled 2021-08-16 (×3): qty 2

## 2021-08-16 MED ORDER — ONDANSETRON HCL 4 MG PO TABS: 4.0000 mg | ORAL_TABLET | Freq: Four times a day (QID) | ORAL | Status: DC | PRN

## 2021-08-16 MED ORDER — SODIUM CHLORIDE 0.9 % IV BOLUS
1000.0000 mL | Freq: Once | INTRAVENOUS | Status: AC
  Administered 2021-08-16: 1000 mL via INTRAVENOUS

## 2021-08-16 MED ORDER — OMEGA-3-ACID ETHYL ESTERS 1 G PO CAPS
1.0000 g | ORAL_CAPSULE | Freq: Every day | ORAL | Status: DC
  Administered 2021-08-18 – 2021-08-23 (×6): 1 g via ORAL
  Filled 2021-08-16 (×7): qty 1

## 2021-08-16 MED ORDER — SIMVASTATIN 20 MG PO TABS
20.0000 mg | ORAL_TABLET | Freq: Two times a day (BID) | ORAL | Status: DC
  Administered 2021-08-17 – 2021-08-23 (×13): 20 mg via ORAL
  Filled 2021-08-16 (×14): qty 1

## 2021-08-16 MED ORDER — CARBAMAZEPINE 200 MG PO TABS
200.0000 mg | ORAL_TABLET | Freq: Two times a day (BID) | ORAL | Status: DC
  Administered 2021-08-17 – 2021-08-23 (×13): 200 mg via ORAL
  Filled 2021-08-16 (×15): qty 1

## 2021-08-16 MED ORDER — SODIUM CHLORIDE 0.9 % IV SOLN
12.5000 mg | Freq: Four times a day (QID) | INTRAVENOUS | Status: DC | PRN
  Filled 2021-08-16 (×2): qty 0.5

## 2021-08-16 MED ORDER — DOXAZOSIN MESYLATE 1 MG PO TABS
1.0000 mg | ORAL_TABLET | Freq: Every day | ORAL | Status: DC
  Administered 2021-08-17 – 2021-08-22 (×6): 1 mg via ORAL
  Filled 2021-08-16 (×9): qty 1

## 2021-08-16 MED ORDER — IRBESARTAN 150 MG PO TABS
300.0000 mg | ORAL_TABLET | Freq: Every day | ORAL | Status: DC
  Administered 2021-08-17 – 2021-08-23 (×7): 300 mg via ORAL
  Filled 2021-08-16 (×8): qty 2

## 2021-08-16 MED ORDER — TRAZODONE HCL 50 MG PO TABS
25.0000 mg | ORAL_TABLET | Freq: Every evening | ORAL | Status: DC | PRN
  Administered 2021-08-20: 25 mg via ORAL
  Filled 2021-08-16: qty 1

## 2021-08-16 MED ORDER — ACETAMINOPHEN 650 MG RE SUPP: 650.0000 mg | Freq: Four times a day (QID) | RECTAL | Status: DC | PRN

## 2021-08-16 MED ORDER — SODIUM CHLORIDE 0.9 % IV SOLN: INTRAVENOUS | Status: DC

## 2021-08-16 MED ORDER — ALISKIREN FUMARATE 150 MG PO TABS
300.0000 mg | ORAL_TABLET | Freq: Every day | ORAL | Status: DC
  Administered 2021-08-17 – 2021-08-23 (×7): 300 mg via ORAL
  Filled 2021-08-16 (×7): qty 2

## 2021-08-16 MED ORDER — IOHEXOL 350 MG/ML SOLN
100.0000 mL | Freq: Once | INTRAVENOUS | Status: AC | PRN
  Administered 2021-08-16: 100 mL via INTRAVENOUS

## 2021-08-16 MED ORDER — ONDANSETRON HCL 4 MG/2ML IJ SOLN
4.0000 mg | Freq: Once | INTRAMUSCULAR | Status: DC
  Administered 2021-08-16: 4 mg via INTRAVENOUS
  Filled 2021-08-16: qty 2

## 2021-08-16 MED ORDER — PANTOPRAZOLE SODIUM 40 MG PO TBEC: 40.0000 mg | DELAYED_RELEASE_TABLET | Freq: Every day | ORAL | Status: DC

## 2021-08-16 MED ORDER — MAGNESIUM HYDROXIDE 400 MG/5ML PO SUSP: 30.0000 mL | Freq: Every day | ORAL | Status: DC | PRN

## 2021-08-16 MED ORDER — VITAMIN B-12 1000 MCG PO TABS
1000.0000 ug | ORAL_TABLET | Freq: Every day | ORAL | Status: DC
  Administered 2021-08-18 – 2021-08-23 (×6): 1000 ug via ORAL
  Filled 2021-08-16 (×7): qty 1

## 2021-08-16 NOTE — ED Provider Notes (Signed)
[facility] Emergency Department Provider Note    ____________________________________________   Event Date/Time   First MD Initiated Contact with Patient 08/16/21 1259     (approximate)  I have reviewed the triage vital signs and the nursing notes.   HISTORY  Chief Complaint Fall (Left ankle injury)   HPI [name] is a 80 y.o. male, history of hypertension, stroke, seizures, and atrial fibrillation, presenting to the emergency department for evaluation of syncope with left ankle injury.  Pt states that he was walking back from the bathroom when he suddenly began experiencing lightheadedness and diaphoresis.  He then proceeded to pass out and fall to the floor. His wife was nearby when the incident occurred, confirmed that he was notably sweaty prior to the fall and does not think that he hit his head, though cannot definitively confirm.  No endorsement of chest pain before or after the fall.  No reported seizures. No endorsement of recent illness or head injury. After a brief LOC, the patient woke with significant pain in his left ankle.  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   History limited by: None.  Past Medical History:  Diagnosis Date   Arthritis    "lower back on down; into all joints; into my feet" (09/17/2012)   Chronic low back pain    hx of   Complication of anesthesia    "cardiac arrest with dye from lexiscan (04/15/10 records indicate bradycardia, no ischemia, EF 56%), unstable BP with anesthesia)   GERD (gastroesophageal reflux disease)    Grand mal 1987; 1989   "take RX daily" (09/17/2012)   Herniated disc, cervical    Hypercholesteremia    Hypertension    sees Dr. Jerl Mina, [place] [facility] in [place]   Intracranial bleed [facility])    december 2010   Left leg DVT (HCC) 09/2019   Migraines    "2 in my life" (09/17/2012)   Mitral  regurgitation    a. 01/2019 Echo: EF 55-60%, mild conc LVH. RVSP 59.12mmHg. Mildly dil LA. Mild to mod MR. Mild AI.   PAH (pulmonary artery hypertension) (HCC)    Permanent atrial fibrillation (HCC)    a. CHA2DS2VASc = 5-->No OAC 2/2 h/o SAH; b. 03/2019 Zio: permanent Afib - 78 bpm (46-135). Occas PVCs (1.4% burden).   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 Harold Hedge)   CATARACT EXTRACTION W/ INTRAOCULAR LENS IMPLANT  1980's   "right" (09/17/2012)   COLONOSCOPY     06/26/09   EYE MUSCLE SURGERY  1980's   "right eye" (09/17/2012)  INGUINAL HERNIA REPAIR  04/10/2011   "right" (09/17/2012)   JOINT REPLACEMENT     PERIPHERAL VASCULAR THROMBECTOMY Left 10/27/2019   Procedure: PERIPHERAL VASCULAR THROMBECTOMY;  Surgeon: Algernon Huxley, MD;  Location: [facility];  Service: Cardiovascular;  Laterality: Left;   POSTERIOR LUMBAR FUSION  09/16/2012   "L4-5" (09/17/2012)   RETINAL DETACHMENT SURGERY  1980's   "right" (09/17/2012)   TOTAL KNEE ARTHROPLASTY  04/28/2010   "left" (09/17/2012)    Prior to Admission medications   Medication Sig Start Date End Date Taking? Authorizing Provider  aliskiren  (TEKTURNA) 300 MG tablet Take 300 mg by mouth daily.    Yes [provider]  amoxicillin (AMOXIL) 500 MG tablet Take 500 mg by mouth as directed. Dental procedure only   Yes [provider]  carbamazepine (TEGRETOL) 200 MG tablet Take 200 mg by mouth 2 (two) times a day.    Yes [provider]  doxazosin (CARDURA) 1 MG tablet Take 1 mg by mouth at bedtime.    Yes [provider]  ELIQUIS 5 MG TABS tablet Take 5 mg by mouth 2 (two) times daily. 10/06/19  Yes [provider]  furosemide (LASIX) 20 MG tablet Take 2 tablets (40 mg total) by mouth daily. 05/27/19  Yes Theora Gianotti, NP  irbesartan (AVAPRO) 300 MG tablet Take 300 mg by mouth daily. 11/19/17  Yes [provider]  Omega-3 Fatty Acids (FISH OIL) 1200 MG CAPS Take 1,200 mg by mouth daily.   Yes [provider]  pantoprazole (PROTONIX) 40 MG tablet Take 40 mg by mouth at bedtime.    Yes [provider]  simvastatin (ZOCOR) 20 MG tablet Take 20 mg by mouth 2 (two) times daily.    Yes [provider]  vitamin B-12 (CYANOCOBALAMIN) 1000 MCG tablet Take 1,000 mcg by mouth daily.   Yes [provider]  amLODipine (NORVASC) 2.5 MG tablet Take 2.5 mg by mouth daily. Patient not taking: Reported on 08/16/2021 02/19/21   [provider]    Allergies Codeine, Fentanyl, Regadenoson, and Verapamil  Family History  Problem Relation Age of Onset   Heart disease Mother     Social History Social History   Tobacco Use   Smoking status: Never   Smokeless tobacco: Never  Vaping Use   Vaping Use: Never used  Substance Use Topics   Alcohol use: No   Drug use: No    Review of Systems  Constitutional: Negative for fever/chills, weight loss, or fatigue.  Eyes: Negative for visual changes or discharge.  ENT: Negative for congestion, hearing changes, or sore throat.  Gastrointestinal: Negative for abdominal pain, nausea/vomiting, or  diarrhea.  Genitourinary: Negative for dysuria or hematuria.  Musculoskeletal: Positive for left ankle pain.  Negative for back pain or joint pain.  Skin: Negative for rashes or lesions.  Neurological: Negative for headache, syncope, dizziness, tremors, or numbness/tingling.   10-point ROS otherwise negative. ____________________________________________   PHYSICAL EXAM:  VITAL SIGNS: ED Triage Vitals [08/16/21 1306]  Enc Vitals Group     BP (!) 123/59     Pulse Rate 61     Resp 18     Temp 97.7 F (36.5 C)     Temp Source Oral     SpO2 99 %     Weight      Height      Head Circumference      Peak Flow      Pain Score  Pain Loc      Pain Edu?      Excl. in [place]?     Physical Exam Constitutional:      General: He is not in acute distress.    Appearance: Normal appearance. He is not ill-appearing.  HENT:     Head: Normocephalic and atraumatic.     Mouth/Throat:     Mouth: Mucous membranes are moist.     Pharynx: Oropharynx is clear.  Eyes:     Extraocular Movements: Extraocular movements intact.     Conjunctiva/sclera: Conjunctivae normal.     Pupils: Pupils are equal, round, and reactive to light.  Cardiovascular:     Rate and Rhythm: Normal rate and regular rhythm.     Pulses: Normal pulses.     Heart sounds: Normal heart sounds.  Pulmonary:     Effort: Pulmonary effort is normal.     Breath sounds: Normal breath sounds.  Abdominal:     General: Abdomen is flat. There is no distension.     Palpations: Abdomen is soft.     Tenderness: There is no abdominal tenderness.  Musculoskeletal:     Cervical back: Normal range of motion and neck supple. No rigidity or tenderness.     Comments: Diffuse swelling and tenderness around the left ankle.  No tenderness to the metatarsals or tibia/fibula.   Skin:    General: Skin is warm and dry.  Neurological:     Mental Status: He is alert and oriented to person, place, and time.     Comments: At baseline.  Cranial  nerves II through XII intact.  Weakness and sensory deficit appreciated on the left lower extremity, though the patient states that this is usually normal for him given his chronic neuropathy.  Psychiatric:        Mood and Affect: Mood normal.        Behavior: Behavior normal.        Thought Content: Thought content normal.        Judgment: Judgment normal.     ____________________________________________    LABS  (all labs ordered are listed, but only abnormal results are displayed)  Labs Reviewed  BASIC METABOLIC PANEL - Abnormal; Notable for the following components:      Result Value   Glucose, Bld 120 (*)    Creatinine, Ser 1.25 (*)    Calcium 8.8 (*)    GFR, Estimated 58 (*)    All other components within normal limits  CBC WITH DIFFERENTIAL/PLATELET - Abnormal; Notable for the following components:   RBC 3.34 (*)    Hemoglobin 7.3 (*)    HCT 25.3 (*)    MCV 75.7 (*)    MCH 21.9 (*)    MCHC 28.9 (*)    RDW 17.1 (*)    All other components within normal limits  APTT - Abnormal; Notable for the following components:   aPTT 23 (*)    All other components within normal limits  PROTIME-INR - Abnormal; Notable for the following components:   Prothrombin Time 17.0 (*)    INR 1.4 (*)    All other components within normal limits  BASIC METABOLIC PANEL - Abnormal; Notable for the following components:   Calcium 8.7 (*)    Anion gap 4 (*)    All other components within normal limits  CBC - Abnormal; Notable for the following components:   RBC 3.39 (*)    Hemoglobin 7.6 (*)    HCT 25.5 (*)  MCV 75.2 (*)    MCH 22.4 (*)    MCHC 29.8 (*)    RDW 17.1 (*)    All other components within normal limits  HEMOGLOBIN AND HEMATOCRIT, BLOOD - Abnormal; Notable for the following components:   Hemoglobin 7.5 (*)    HCT 25.6 (*)    All other components within normal limits  CBG MONITORING, ED - Abnormal; Notable for the following components:   Glucose-Capillary 116 (*)    All  other components within normal limits  RESP PANEL BY RT-PCR (FLU A&B, COVID) [id number]  HEPATIC FUNCTION PANEL  HEMOGLOBIN AND HEMATOCRIT, BLOOD  HEMOGLOBIN AND HEMATOCRIT, BLOOD  TYPE AND SCREEN  PREPARE RBC (CROSSMATCH)  TROPONIN I (HIGH SENSITIVITY)  TROPONIN I (HIGH SENSITIVITY)     ____________________________________________   EKG Atrial fibrillation, rate of 61, ST segment changes, LAD, no AV blocks.   ____________________________________________    RADIOLOGY I personally viewed and evaluated these images as part of my medical decision making, as well as reviewing the written report by the radiologist.  ED Provider Interpretation: I agree with the radiologist interpretation of these findings.  DG Chest 2 View  Result Date: 08/16/2021 CLINICAL DATA:  80 year old male with a history of syncope EXAM: CHEST - 2 VIEW COMPARISON:  02/17/2019 FINDINGS: The cardiac diameter appears enlarged compared to the prior, with more globular configuration and straightening of the left heart border. Stigmata of emphysema, with increased retrosternal airspace, flattened hemidiaphragms, increased AP diameter, and hyperinflation on the AP view. Hazy opacity at the bilateral lung bases with partial obscuration of the hemidiaphragms and the heart borders. Coarsened interstitial markings bilaterally. No pneumothorax. Degenerative changes of the spine.  No acute displaced fracture IMPRESSION: Increased diameter of the heart with a configuration suspicious for pericardial effusion. Correlation with either chest CT or cardiac echo may be considered. Opacities at the bilateral lung bases, likely a combination of pleural effusion and associated atelectasis/consolidation. Coarsened interstitial markings, potentially early pulmonary edema Electronically Signed   By: Corrie Mckusick D.O.   On: 08/16/2021 15:09   DG Tibia/Fibula Left  Result Date: 08/16/2021 CLINICAL DATA:  Pain after fall. EXAM: LEFT TIBIA AND  FIBULA - 2 VIEW; LEFT FOOT - COMPLETE 3+ VIEW COMPARISON:  X-ray left tibia fibula and left femur 01/30/2019 FINDINGS: Right foot: No evidence of fracture, dislocation, or joint effusion. No evidence of severe arthropathy. No aggressive appearing focal bone abnormality. Subcutaneus soft tissue edema. Right tibia fibula: Acute laterally displaced fracture of the transsyndesmotic distal fibula. Acute laterally displaced medial malleolar fracture. No definite dislocation of the left ankle. Cortical irregularity of the intra-articular anterior tibia. No acute displaced fracture or dislocation of the proximal tibia fibula. The knee is grossly unremarkable. Total left knee arthroplasty. Associated subcutaneus soft tissue edema. IMPRESSION: 1. Acute laterally displaced fracture of the transsyndesmotic distal fibula. 2. Acute laterally displaced medial malleolar fracture. 3. Cortical irregularity of the intra-articular anterior tibia. Recommend dedicated left ankle radiograph. 4. No acute displaced fracture or dislocation of the proximal tibia fibula in the setting of a total left knee arthroplasty. 5. No acute displaced fracture or dislocation of the bones of the left foot. Electronically Signed   By: Iven Finn M.D.   On: 08/16/2021 15:11   DG Ankle Complete Left  Result Date: 08/16/2021 CLINICAL DATA:  Radiologist recommended dedicated ankle film to further evaluate injury EXAM: LEFT ANKLE COMPLETE - 3+ VIEW COMPARISON:  Same day radiographs. FINDINGS: Redemonstrated acute laterally displaced oblique fracture of the distal fibula  involving the syndesmosis. Redemonstrated acute laterally displaced medial malleolar fracture. Similar ill-defined cortical regularity of the intra-articular anterior tibia. Possible small joint effusion. Vascular calcifications. Calcaneal enthesophytes. IMPRESSION: 1. Redemonstrated acute laterally displaced oblique fracture of the distal fibula involving the syndesmosis. 2.  Redemonstrated acute laterally displaced medial malleolar fracture. 3. Similar ill-defined cortical regularity of the intra-articular anterior tibia, possibly representing fracture given trauma and small effusion. CT of the ankle could confirm and better evaluate if clinically indicated. Electronically Signed   By: Margaretha Sheffield M.D.   On: 08/16/2021 16:20   CT Head Wo Contrast  Result Date: 08/16/2021 CLINICAL DATA:  Fall, head trauma EXAM: CT HEAD WITHOUT CONTRAST TECHNIQUE: Contiguous axial images were obtained from the base of the skull through the vertex without intravenous contrast. COMPARISON:  CT head 08/06/2019 FINDINGS: Brain: No acute intracranial hemorrhage, mass effect, or herniation. No extra-axial fluid collections. No evidence of acute territorial infarct. No hydrocephalus. Mild-to-moderate cortical volume loss. Patchy hypodensities in the periventricular and subcortical white matter, likely secondary to chronic microvascular ischemic changes. Vascular: Calcified plaques in the carotid siphons. Skull: Normal. Negative for fracture or focal lesion. Sinuses/Orbits: No acute finding. Other: None. IMPRESSION: Chronic changes with no acute intracranial process identified. Electronically Signed   By: Ofilia Neas M.D.   On: 08/16/2021 14:41   CT Angio Chest PE W and/or Wo Contrast  Result Date: 08/16/2021 CLINICAL DATA:  Witnessed fall, dizziness, anemia EXAM: CT ANGIOGRAPHY CHEST CT ABDOMEN AND PELVIS WITH CONTRAST TECHNIQUE: Multidetector CT imaging of the chest was performed using the standard protocol during bolus administration of intravenous contrast. Multiplanar CT image reconstructions and MIPs were obtained to evaluate the vascular anatomy. Multidetector CT imaging of the abdomen and pelvis was performed using the standard protocol during bolus administration of intravenous contrast. CONTRAST:  162mL OMNIPAQUE IOHEXOL 350 MG/ML SOLN COMPARISON:  08/16/2021 FINDINGS: CTA CHEST  FINDINGS Cardiovascular: This is a technically adequate evaluation of the pulmonary vasculature. No filling defects or pulmonary emboli. The heart is enlarged, with prominent biatrial dilation. No pericardial effusion. Extensive atherosclerosis of the coronary vasculature. No evidence of thoracic aortic aneurysm or dissection. Mild aortic atherosclerosis. Mediastinum/Nodes: No enlarged mediastinal, hilar, or axillary lymph nodes. Thyroid gland, trachea, and esophagus demonstrate no significant findings. Lungs/Pleura: No airspace disease, effusion, or pneumothorax. Central airways are patent. Musculoskeletal: No acute displaced fractures. Reconstructed images demonstrate no additional findings. Review of the MIP images confirms the above findings. CT ABDOMEN and PELVIS FINDINGS Hepatobiliary: No focal liver abnormality is seen. No gallstones, gallbladder wall thickening, or biliary dilatation. Pancreas: Unremarkable. No pancreatic ductal dilatation or surrounding inflammatory changes. Spleen: Normal in size without focal abnormality. 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 Mild nonspecific thickening of the left adrenal gland measuring up to 1.4 cm. 4 mm nonobstructing calculus mid right kidney. 2 mm nonobstructing calculus lower pole left kidney. There is mild bilateral renal cortical thinning. No obstructive uropathy. The bladder is unremarkable. Stomach/Bowel: No bowel obstruction or ileus. Normal appendix right lower quadrant. No bowel wall thickening or inflammatory change. Vascular/Lymphatic: Aortic atherosclerosis. No enlarged abdominal or pelvic lymph nodes. Reproductive: Prostate is unremarkable. Other: No free fluid or free gas.  No abdominal wall hernia. Musculoskeletal: No acute displaced fractures. Postsurgical changes at L4-5. Reconstructed images demonstrate no additional findings. Review of the MIP images confirms the above findings. IMPRESSION: 1. No evidence of pulmonary embolus.  2. Cardiomegaly, with no evidence of pericardial effusion. 3. No acute intrathoracic, intra-abdominal, or intrapelvic trauma. 4. Punctate bilateral nonobstructing  renal calculi. 5.  Aortic Atherosclerosis (ICD10-I70.0). Electronically Signed   By: Randa Ngo M.D.   On: 08/16/2021 16:04   CT Cervical Spine Wo Contrast  Result Date: 08/16/2021 CLINICAL DATA:  Neck pain EXAM: CT CERVICAL SPINE WITHOUT CONTRAST TECHNIQUE: Multidetector CT imaging of the cervical spine was performed without intravenous contrast. Multiplanar CT image reconstructions were also generated. COMPARISON:  MRI cervical spine 08/18/2014 FINDINGS: Alignment: Grade 1 anterolisthesis of C3 on C4 and C4 on C5. Skull base and vertebrae: No acute fracture. No primary bone lesion or focal pathologic process. Soft tissues and spinal canal: No prevertebral fluid or swelling. No visible canal hematoma. Disc levels: Moderate intervertebral disc height loss at C5-C6 and mild-to-moderate at C6-C7. Dorsal endplate osteophytes and uncovertebral spurring most prominent at C5-C6. Facet arthropathy which is worse on the right. Multilevel neural foraminal narrowing most significant at C5-C6. Upper chest: No acute process identified. Other: None. IMPRESSION: Chronic degenerative changes with no acute fracture or subluxation identified. Electronically Signed   By: Ofilia Neas M.D.   On: 08/16/2021 14:44   CT Abdomen Pelvis W Contrast  Result Date: 08/16/2021 CLINICAL DATA:  Witnessed fall, dizziness, anemia EXAM: CT ANGIOGRAPHY CHEST CT ABDOMEN AND PELVIS WITH CONTRAST TECHNIQUE: Multidetector CT imaging of the chest was performed using the standard protocol during bolus administration of intravenous contrast. Multiplanar CT image reconstructions and MIPs were obtained to evaluate the vascular anatomy. Multidetector CT imaging of the abdomen and pelvis was performed using the standard protocol during bolus administration of intravenous contrast.  CONTRAST:  154mL OMNIPAQUE IOHEXOL 350 MG/ML SOLN COMPARISON:  08/16/2021 FINDINGS: CTA CHEST FINDINGS Cardiovascular: This is a technically adequate evaluation of the pulmonary vasculature. No filling defects or pulmonary emboli. The heart is enlarged, with prominent biatrial dilation. No pericardial effusion. Extensive atherosclerosis of the coronary vasculature. No evidence of thoracic aortic aneurysm or dissection. Mild aortic atherosclerosis. Mediastinum/Nodes: No enlarged mediastinal, hilar, or axillary lymph nodes. Thyroid gland, trachea, and esophagus demonstrate no significant findings. Lungs/Pleura: No airspace disease, effusion, or pneumothorax. Central airways are patent. Musculoskeletal: No acute displaced fractures. Reconstructed images demonstrate no additional findings. Review of the MIP images confirms the above findings. CT ABDOMEN and PELVIS FINDINGS Hepatobiliary: No focal liver abnormality is seen. No gallstones, gallbladder wall thickening, or biliary dilatation. Pancreas: Unremarkable. No pancreatic ductal dilatation or surrounding inflammatory changes. Spleen: Normal in size without focal abnormality. 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 Mild nonspecific thickening of the left adrenal gland measuring up to 1.4 cm. 4 mm nonobstructing calculus mid right kidney. 2 mm nonobstructing calculus lower pole left kidney. There is mild bilateral renal cortical thinning. No obstructive uropathy. The bladder is unremarkable. Stomach/Bowel: No bowel obstruction or ileus. Normal appendix right lower quadrant. No bowel wall thickening or inflammatory change. Vascular/Lymphatic: Aortic atherosclerosis. No enlarged abdominal or pelvic lymph nodes. Reproductive: Prostate is unremarkable. Other: No free fluid or free gas.  No abdominal wall hernia. Musculoskeletal: No acute displaced fractures. Postsurgical changes at L4-5. Reconstructed images demonstrate no additional findings. Review of  the MIP images confirms the above findings. IMPRESSION: 1. No evidence of pulmonary embolus. 2. Cardiomegaly, with no evidence of pericardial effusion. 3. No acute intrathoracic, intra-abdominal, or intrapelvic trauma. 4. Punctate bilateral nonobstructing renal calculi. 5.  Aortic Atherosclerosis (ICD10-I70.0). Electronically Signed   By: Randa Ngo M.D.   On: 08/16/2021 16:04   DG Foot Complete Left  Result Date: 08/16/2021 CLINICAL DATA:  Pain after fall. EXAM: LEFT TIBIA AND FIBULA - 2 VIEW;  LEFT FOOT - COMPLETE 3+ VIEW COMPARISON:  X-ray left tibia fibula and left femur 01/30/2019 FINDINGS: Right foot: No evidence of fracture, dislocation, or joint effusion. No evidence of severe arthropathy. No aggressive appearing focal bone abnormality. Subcutaneus soft tissue edema. Right tibia fibula: Acute laterally displaced fracture of the transsyndesmotic distal fibula. Acute laterally displaced medial malleolar fracture. No definite dislocation of the left ankle. Cortical irregularity of the intra-articular anterior tibia. No acute displaced fracture or dislocation of the proximal tibia fibula. The knee is grossly unremarkable. Total left knee arthroplasty. Associated subcutaneus soft tissue edema. IMPRESSION: 1. Acute laterally displaced fracture of the transsyndesmotic distal fibula. 2. Acute laterally displaced medial malleolar fracture. 3. Cortical irregularity of the intra-articular anterior tibia. Recommend dedicated left ankle radiograph. 4. No acute displaced fracture or dislocation of the proximal tibia fibula in the setting of a total left knee arthroplasty. 5. No acute displaced fracture or dislocation of the bones of the left foot. Electronically Signed   By: Iven Finn M.D.   On: 08/16/2021 15:11    ____________________________________________   PROCEDURES  .Critical Care Performed by: Teodoro Spray, PA Authorized by: Teodoro Spray, PA   Critical care provider  statement:    Critical care time (minutes):  30   Critical care time was exclusive of:  Separately billable procedures and treating other patients   Critical care was necessary to treat or prevent imminent or life-threatening deterioration of the following conditions:  Trauma   Critical care was time spent personally by me on the following activities:  Development of treatment plan with patient or surrogate, discussions with consultants, evaluation of patient's response to treatment, examination of patient, ordering and review of laboratory studies, ordering and review of radiographic studies, ordering and performing treatments and interventions, pulse oximetry, re-evaluation of patient's condition and review of old charts   Medications  promethazine (PHENERGAN) 12.5 mg in sodium chloride 0.9 % 50 mL IVPB (0 mg Intravenous Hold 08/16/21 1903)  aliskiren (TEKTURNA) tablet 300 mg (300 mg Oral Given 08/17/21 0820)  doxazosin (CARDURA) tablet 1 mg (0 mg Oral Hold 08/17/21 0123)  irbesartan (AVAPRO) tablet 300 mg (300 mg Oral Given 08/17/21 0819)  simvastatin (ZOCOR) tablet 20 mg (20 mg Oral Given 08/17/21 0821)  vitamin B-12 (CYANOCOBALAMIN) tablet 1,000 mcg (1,000 mcg Oral Not Given 08/17/21 0827)  carbamazepine (TEGRETOL) tablet 200 mg (200 mg Oral Given 08/17/21 0821)  omega-3 acid ethyl esters (LOVAZA) capsule 1 g (1 g Oral Not Given 08/17/21 0826)  0.9 %  sodium chloride infusion ( Intravenous New Bag/Given 08/17/21 0814)  acetaminophen (TYLENOL) tablet 650 mg (has no administration in time range)    Or  acetaminophen (TYLENOL) suppository 650 mg (has no administration in time range)  traZODone (DESYREL) tablet 25 mg (has no administration in time range)  magnesium hydroxide (MILK OF MAGNESIA) suspension 30 mL (has no administration in time range)  ondansetron (ZOFRAN) tablet 4 mg (has no administration in time range)    Or  ondansetron (ZOFRAN) injection 4 mg (has no administration in time  range)  pantoprozole (PROTONIX) 80 mg /NS 100 mL infusion (8 mg/hr Intravenous IV Pump [employer] 08/17/21 0817)  pantoprazole (PROTONIX) injection 40 mg (has no administration in time range)  sodium chloride 0.9 % bolus 1,000 mL (0 mLs Intravenous Stopped 08/16/21 1600)  HYDROmorphone (DILAUDID) injection 0.5 mg (0.5 mg Intravenous Given 08/16/21 1409)  iohexol (OMNIPAQUE) 350 MG/ML injection 100 mL (100 mLs Intravenous Contrast Given 08/16/21 1545)  pantoprazole (PROTONIX) 80  mg /NS 100 mL IVPB (0 mg Intravenous Stopped 08/17/21 0801)    Critical Care performed: Yes ____________________________________________   INITIAL IMPRESSION / ASSESSMENT AND PLAN / ED COURSE  Pertinent labs & imaging results that were available during my care of the patient were reviewed by me and considered in my medical decision making (see chart for details).      [name] is a 80 y.o. male, history of hypertension, stroke, seizures, and atrial fibrillation, presenting to the emergency department for evaluation of syncope with left ankle injury.  Differentials for syncope include, but not limited to: Serious: arrhythmias, aortic stenosis, hypertrophic cardiomyopathy, seizure, GI bleeding, pulmonary embolus, myocardial infarction, aortic dissection, CVA adrenal insufficiency, ectopic pregnancy, sepsis Common/Non-emergent: vasovagal response, orthostatic hypotension, hypoglycemia, anemia, hyperventilation, alcohol/drugs.   Patient overall appears well. His primary complaint is his left ankle injury. He is not currently endorsing any chest pain, shortness of breath, dizziness, or lightheadedness at this time.   Clinical Course as of 08/17/21 0914  Tue Aug 16, 2021  1421 CBC shows hemoglobin of 7.3.  Elevated PT, INR, and APTT.  [BS]  1506 Digital rectal exam performed.  No gross hematochezia, but was positive on point-of-care blood test.  Will order abdominal CT/pelvis. [BS]  1542 Plain film shows  distal fibula and medial malleolus fracture. Will apply posterior and stirrup splint. Sent message to Dr. Christia Reading with orthopedic surgery. Requested that we consult orthopedics after admission. [BS]  [id number] Chest x-ray shows cardiomegaly, suspicious for pericardial effusion.  Will order CT angio to further evaluate effusion and evaluate for  PE [BS]  1820 CT angio unremarkable. No signs of pericardial effusion or PE. [BS]  1821 CT negative for acute intracranial findings.  CT cervical spine also negative. [BS]    Clinical Course User Index [BS] Teodoro Spray, PA   Given the patient's negative cardiac work-up thus far, I do not suspect that this patient's syncope is cardiogenic in nature.  I believe it is likely that the patient experienced a vasovagal or orthostatic response, exacerbated by his anemia.  He denies any complaints of fatigue or hematochezia/melena, but does have a hemoglobin of 7.3 and a positive occult blood stool test that likely needs to be evaluated further.  Given these findings and his current distal fibula/medial malleoli fracture, will initiate 2 units of pRBC's and admit for further observation and work-up.   ____________________________________________   FINAL CLINICAL IMPRESSION(S) / ED DIAGNOSES  Final diagnoses:  Anemia, unspecified type  Tibia/fibula fracture, left, closed, initial encounter     NEW MEDICATIONS STARTED DURING THIS VISIT:  ED Discharge Orders     None        Note:  This document was prepared using Dragon voice recognition software and may include unintentional dictation errors.    Teodoro Spray, [place] 08/17/21 [id number]    Vanessa [place], MD 08/17/21 1310

## 2021-08-16 NOTE — Consult Note (Signed)
ORTHOPAEDIC CONSULTATION  REQUESTING PHYSICIAN: Mansy, Arvella Merles, MD  Chief Complaint: Left ankle injury   HPI: [name] is a 80 y.o. male who complains of minimal left ankle pain after a fall today.  I saw the patient earlier today at approximately 5 pm.  Patient did not recall how he fell. It sounds as if the patient was dizzy preceeding the fall and may have had a syncopal episode.  He has a history of A.fib with bradycardia and is on Eliquis.   He took a dose this AM.  Patient was seen in the ER with his wife in the room.  Patient explains he has peripheral neuropathy at baseline and therefore has diminished sensation and motor function of his feet.    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 [name], [facility] in [place]   Intracranial bleed [facility])    december 2010   Left leg DVT ([place]) 09/2019   Migraines    "2 in my life" (09/17/2012)   Mitral regurgitation    a. 01/2019 Echo: EF 55-60%, mild conc LVH. RVSP 59.5mmHg. Mildly dil LA. Mild to mod MR. Mild AI.   PAH (pulmonary artery hypertension) (HCC)    Permanent atrial fibrillation ([place])    a. CHA2DS2VASc = 5-->No OAC 2/2 h/o SAH; b. 03/2019 [facility]: permanent Afib - 78 bpm (46-135). Occas PVCs (1.4% burden).   Retina disorder    hx of torn retina   Stroke ([place]) 2002   LLE "just a little bit weaker" (09/17/2012)   Subarachnoid hemorrhage ([place]) 08/2009   Past Surgical History:  Procedure Laterality Date   BLEPHAROPLASTY  1990's   "right eye" (09/16/2012)   CARDIOVASCULAR  STRESS TEST     nl perfusion w/o ischemia, EF 56%, bradycardia without syncope 04/15/10 (Dr. Bartholome Bill)   CATARACT EXTRACTION W/ INTRAOCULAR LENS IMPLANT  1980's   "right" (09/17/2012)   COLONOSCOPY     06/26/09   EYE MUSCLE SURGERY  1980's   "right eye" (09/17/2012)   INGUINAL HERNIA REPAIR  04/10/2011   "right" (09/17/2012)   JOINT REPLACEMENT     PERIPHERAL VASCULAR THROMBECTOMY Left 10/27/2019   Procedure: PERIPHERAL VASCULAR THROMBECTOMY;  Surgeon: Algernon Huxley, MD;  Location: [facility];  Service: Cardiovascular;  Laterality: Left;   POSTERIOR LUMBAR FUSION  09/16/2012   "L4-5" (09/17/2012)   RETINAL DETACHMENT SURGERY  1980's   "right" (09/17/2012)   TOTAL KNEE ARTHROPLASTY  04/28/2010   "left" (09/17/2012)   Social History   Socioeconomic History   Marital status: Married    Spouse name: Not on file   Number of children: Not on file   Years of education: Not on file   Highest education level: Not on file  Occupational History   Not on file  Tobacco Use   Smoking status: Never   Smokeless tobacco: Never  Vaping Use   Vaping Use: Never used  Substance and Sexual Activity   Alcohol use: No  Drug use: No   Sexual activity: Not Currently  Other Topics Concern   Not on file  Social History Narrative   Lives at home with wife in private residence   Social Determinants of Health   Financial Resource Strain: Not on file  Food Insecurity: Not on file  Transportation Needs: Not on file  Physical Activity: Not on file  Stress: Not on file  Social Connections: Not on file   Family History  Problem Relation Age of Onset   Heart disease Mother    Allergies  Allergen Reactions   Codeine Nausea Only and Other (See Comments)   Fentanyl Other (See Comments)    Given before knee surgery, blood pressure became extremely low   Regadenoson Other (See Comments)    Created cardiac arrest   Verapamil Other (See Comments)    Per patient: Medication has caused low  heart rate and caused patient to have afib that he currently has   Prior to Admission medications   Medication Sig Start Date End Date Taking? Authorizing Provider  aliskiren (TEKTURNA) 300 MG tablet Take 300 mg by mouth daily.    Yes [provider]  amoxicillin (AMOXIL) 500 MG tablet Take 500 mg by mouth as directed. Dental procedure only   Yes [provider]  carbamazepine (TEGRETOL) 200 MG tablet Take 200 mg by mouth 2 (two) times a day.    Yes [provider]  doxazosin (CARDURA) 1 MG tablet Take 1 mg by mouth at bedtime.    Yes [provider]  ELIQUIS 5 MG TABS tablet Take 5 mg by mouth 2 (two) times daily. 10/06/19  Yes [provider]  furosemide (LASIX) 20 MG tablet Take 2 tablets (40 mg total) by mouth daily. 05/27/19  Yes Theora Gianotti, NP  irbesartan (AVAPRO) 300 MG tablet Take 300 mg by mouth daily. 11/19/17  Yes [provider]  Omega-3 Fatty Acids (FISH OIL) 1200 MG CAPS Take 1,200 mg by mouth daily.   Yes [provider]  pantoprazole (PROTONIX) 40 MG tablet Take 40 mg by mouth at bedtime.    Yes [provider]  simvastatin (ZOCOR) 20 MG tablet Take 20 mg by mouth 2 (two) times daily.    Yes [provider]  vitamin B-12 (CYANOCOBALAMIN) 1000 MCG tablet Take 1,000 mcg by mouth daily.   Yes [provider]  amLODipine (NORVASC) 2.5 MG tablet Take 2.5 mg by mouth daily. Patient not taking: Reported on 08/16/2021 02/19/21   [provider]   DG Chest 2 View  Result Date: 08/16/2021 CLINICAL DATA:  80 year old male with a history of syncope EXAM: CHEST - 2 VIEW COMPARISON:  02/17/2019 FINDINGS: The cardiac diameter appears enlarged compared to the prior, with more globular configuration and straightening of the left heart border. Stigmata of emphysema, with increased retrosternal airspace, flattened hemidiaphragms, increased AP diameter, and hyperinflation on the AP view. Hazy  opacity at the bilateral lung bases with partial obscuration of the hemidiaphragms and the heart borders. Coarsened interstitial markings bilaterally. No pneumothorax. Degenerative changes of the spine.  No acute displaced fracture IMPRESSION: Increased diameter of the heart with a configuration suspicious for pericardial effusion. Correlation with either chest CT or cardiac echo may be considered. Opacities at the bilateral lung bases, likely a combination of pleural effusion and associated atelectasis/consolidation. Coarsened interstitial markings, potentially early pulmonary edema Electronically Signed   By: Corrie Mckusick D.O.   On: 08/16/2021 15:09   DG Tibia/Fibula Left  Result  Date: 08/16/2021 CLINICAL DATA:  Pain after fall. EXAM: LEFT TIBIA AND FIBULA - 2 VIEW; LEFT FOOT - COMPLETE 3+ VIEW COMPARISON:  X-ray left tibia fibula and left femur 01/30/2019 FINDINGS: Right foot: No evidence of fracture, dislocation, or joint effusion. No evidence of severe arthropathy. No aggressive appearing focal bone abnormality. Subcutaneus soft tissue edema. Right tibia fibula: Acute laterally displaced fracture of the transsyndesmotic distal fibula. Acute laterally displaced medial malleolar fracture. No definite dislocation of the left ankle. Cortical irregularity of the intra-articular anterior tibia. No acute displaced fracture or dislocation of the proximal tibia fibula. The knee is grossly unremarkable. Total left knee arthroplasty. Associated subcutaneus soft tissue edema. IMPRESSION: 1. Acute laterally displaced fracture of the transsyndesmotic distal fibula. 2. Acute laterally displaced medial malleolar fracture. 3. Cortical irregularity of the intra-articular anterior tibia. Recommend dedicated left ankle radiograph. 4. No acute displaced fracture or dislocation of the proximal tibia fibula in the setting of a total left knee arthroplasty. 5. No acute displaced fracture or dislocation of the bones of the left  foot. Electronically Signed   By: Iven Finn M.D.   On: 08/16/2021 15:11   DG Ankle Complete Left  Result Date: 08/16/2021 CLINICAL DATA:  Radiologist recommended dedicated ankle film to further evaluate injury EXAM: LEFT ANKLE COMPLETE - 3+ VIEW COMPARISON:  Same day radiographs. FINDINGS: Redemonstrated acute laterally displaced oblique fracture of the distal fibula involving the syndesmosis. Redemonstrated acute laterally displaced medial malleolar fracture. Similar ill-defined cortical regularity of the intra-articular anterior tibia. Possible small joint effusion. Vascular calcifications. Calcaneal enthesophytes. IMPRESSION: 1. Redemonstrated acute laterally displaced oblique fracture of the distal fibula involving the syndesmosis. 2. Redemonstrated acute laterally displaced medial malleolar fracture. 3. Similar ill-defined cortical regularity of the intra-articular anterior tibia, possibly representing fracture given trauma and small effusion. CT of the ankle could confirm and better evaluate if clinically indicated. Electronically Signed   By: Margaretha Sheffield M.D.   On: 08/16/2021 16:20   CT Head Wo Contrast  Result Date: 08/16/2021 CLINICAL DATA:  Fall, head trauma EXAM: CT HEAD WITHOUT CONTRAST TECHNIQUE: Contiguous axial images were obtained from the base of the skull through the vertex without intravenous contrast. COMPARISON:  CT head 08/06/2019 FINDINGS: Brain: No acute intracranial hemorrhage, mass effect, or herniation. No extra-axial fluid collections. No evidence of acute territorial infarct. No hydrocephalus. Mild-to-moderate cortical volume loss. Patchy hypodensities in the periventricular and subcortical white matter, likely secondary to chronic microvascular ischemic changes. Vascular: Calcified plaques in the carotid siphons. Skull: Normal. Negative for fracture or focal lesion. Sinuses/Orbits: No acute finding. Other: None. IMPRESSION: Chronic changes with no acute  intracranial process identified. Electronically Signed   By: Ofilia Neas M.D.   On: 08/16/2021 14:41   CT Angio Chest PE W and/or Wo Contrast  Result Date: 08/16/2021 CLINICAL DATA:  Witnessed fall, dizziness, anemia EXAM: CT ANGIOGRAPHY CHEST CT ABDOMEN AND PELVIS WITH CONTRAST TECHNIQUE: Multidetector CT imaging of the chest was performed using the standard protocol during bolus administration of intravenous contrast. Multiplanar CT image reconstructions and MIPs were obtained to evaluate the vascular anatomy. Multidetector CT imaging of the abdomen and pelvis was performed using the standard protocol during bolus administration of intravenous contrast. CONTRAST:  161mL OMNIPAQUE IOHEXOL 350 MG/ML SOLN COMPARISON:  08/16/2021 FINDINGS: CTA CHEST FINDINGS Cardiovascular: This is a technically adequate evaluation of the pulmonary vasculature. No filling defects or pulmonary emboli. The heart is enlarged, with prominent biatrial dilation. No pericardial effusion. Extensive atherosclerosis of the coronary vasculature. No evidence  of thoracic aortic aneurysm or dissection. Mild aortic atherosclerosis. Mediastinum/Nodes: No enlarged mediastinal, hilar, or axillary lymph nodes. Thyroid gland, trachea, and esophagus demonstrate no significant findings. Lungs/Pleura: No airspace disease, effusion, or pneumothorax. Central airways are patent. Musculoskeletal: No acute displaced fractures. Reconstructed images demonstrate no additional findings. Review of the MIP images confirms the above findings. CT ABDOMEN and PELVIS FINDINGS Hepatobiliary: No focal liver abnormality is seen. No gallstones, gallbladder wall thickening, or biliary dilatation. Pancreas: Unremarkable. No pancreatic ductal dilatation or surrounding inflammatory changes. Spleen: Normal in size without focal abnormality. 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 Mild nonspecific thickening of the left adrenal gland measuring up to 1.4  cm. 4 mm nonobstructing calculus mid right kidney. 2 mm nonobstructing calculus lower pole left kidney. There is mild bilateral renal cortical thinning. No obstructive uropathy. The bladder is unremarkable. Stomach/Bowel: No bowel obstruction or ileus. Normal appendix right lower quadrant. No bowel wall thickening or inflammatory change. Vascular/Lymphatic: Aortic atherosclerosis. No enlarged abdominal or pelvic lymph nodes. Reproductive: Prostate is unremarkable. Other: No free fluid or free gas.  No abdominal wall hernia. Musculoskeletal: No acute displaced fractures. Postsurgical changes at L4-5. Reconstructed images demonstrate no additional findings. Review of the MIP images confirms the above findings. IMPRESSION: 1. No evidence of pulmonary embolus. 2. Cardiomegaly, with no evidence of pericardial effusion. 3. No acute intrathoracic, intra-abdominal, or intrapelvic trauma. 4. Punctate bilateral nonobstructing renal calculi. 5.  Aortic Atherosclerosis (ICD10-I70.0). Electronically Signed   By: Randa Ngo M.D.   On: 08/16/2021 16:04   CT Cervical Spine Wo Contrast  Result Date: 08/16/2021 CLINICAL DATA:  Neck pain EXAM: CT CERVICAL SPINE WITHOUT CONTRAST TECHNIQUE: Multidetector CT imaging of the cervical spine was performed without intravenous contrast. Multiplanar CT image reconstructions were also generated. COMPARISON:  MRI cervical spine 08/18/2014 FINDINGS: Alignment: Grade 1 anterolisthesis of C3 on C4 and C4 on C5. Skull base and vertebrae: No acute fracture. No primary bone lesion or focal pathologic process. Soft tissues and spinal canal: No prevertebral fluid or swelling. No visible canal hematoma. Disc levels: Moderate intervertebral disc height loss at C5-C6 and mild-to-moderate at C6-C7. Dorsal endplate osteophytes and uncovertebral spurring most prominent at C5-C6. Facet arthropathy which is worse on the right. Multilevel neural foraminal narrowing most significant at C5-C6. Upper chest:  No acute process identified. Other: None. IMPRESSION: Chronic degenerative changes with no acute fracture or subluxation identified. Electronically Signed   By: Ofilia Neas M.D.   On: 08/16/2021 14:44   CT Abdomen Pelvis W Contrast  Result Date: 08/16/2021 CLINICAL DATA:  Witnessed fall, dizziness, anemia EXAM: CT ANGIOGRAPHY CHEST CT ABDOMEN AND PELVIS WITH CONTRAST TECHNIQUE: Multidetector CT imaging of the chest was performed using the standard protocol during bolus administration of intravenous contrast. Multiplanar CT image reconstructions and MIPs were obtained to evaluate the vascular anatomy. Multidetector CT imaging of the abdomen and pelvis was performed using the standard protocol during bolus administration of intravenous contrast. CONTRAST:  176mL OMNIPAQUE IOHEXOL 350 MG/ML SOLN COMPARISON:  08/16/2021 FINDINGS: CTA CHEST FINDINGS Cardiovascular: This is a technically adequate evaluation of the pulmonary vasculature. No filling defects or pulmonary emboli. The heart is enlarged, with prominent biatrial dilation. No pericardial effusion. Extensive atherosclerosis of the coronary vasculature. No evidence of thoracic aortic aneurysm or dissection. Mild aortic atherosclerosis. Mediastinum/Nodes: No enlarged mediastinal, hilar, or axillary lymph nodes. Thyroid gland, trachea, and esophagus demonstrate no significant findings. Lungs/Pleura: No airspace disease, effusion, or pneumothorax. Central airways are patent. Musculoskeletal: No acute displaced fractures. Reconstructed images  demonstrate no additional findings. Review of the MIP images confirms the above findings. CT ABDOMEN and PELVIS FINDINGS Hepatobiliary: No focal liver abnormality is seen. No gallstones, gallbladder wall thickening, or biliary dilatation. Pancreas: Unremarkable. No pancreatic ductal dilatation or surrounding inflammatory changes. Spleen: Normal in size without focal abnormality. Adrenals/Urinary Tract: 1.8 cm right  adrenal myelolipoma. Mild nonspecific thickening of the left adrenal gland measuring up to 1.4 cm. 4 mm nonobstructing calculus mid right kidney. 2 mm nonobstructing calculus lower pole left kidney. There is mild bilateral renal cortical thinning. No obstructive uropathy. The bladder is unremarkable. Stomach/Bowel: No bowel obstruction or ileus. Normal appendix right lower quadrant. No bowel wall thickening or inflammatory change. Vascular/Lymphatic: Aortic atherosclerosis. No enlarged abdominal or pelvic lymph nodes. Reproductive: Prostate is unremarkable. Other: No free fluid or free gas.  No abdominal wall hernia. Musculoskeletal: No acute displaced fractures. Postsurgical changes at L4-5. Reconstructed images demonstrate no additional findings. Review of the MIP images confirms the above findings. IMPRESSION: 1. No evidence of pulmonary embolus. 2. Cardiomegaly, with no evidence of pericardial effusion. 3. No acute intrathoracic, intra-abdominal, or intrapelvic trauma. 4. Punctate bilateral nonobstructing renal calculi. 5.  Aortic Atherosclerosis (ICD10-I70.0). Electronically Signed   By: Randa Ngo M.D.   On: 08/16/2021 16:04   DG Foot Complete Left  Result Date: 08/16/2021 CLINICAL DATA:  Pain after fall. EXAM: LEFT TIBIA AND FIBULA - 2 VIEW; LEFT FOOT - COMPLETE 3+ VIEW COMPARISON:  X-ray left tibia fibula and left femur 01/30/2019 FINDINGS: Right foot: No evidence of fracture, dislocation, or joint effusion. No evidence of severe arthropathy. No aggressive appearing focal bone abnormality. Subcutaneus soft tissue edema. Right tibia fibula: Acute laterally displaced fracture of the transsyndesmotic distal fibula. Acute laterally displaced medial malleolar fracture. No definite dislocation of the left ankle. Cortical irregularity of the intra-articular anterior tibia. No acute displaced fracture or dislocation of the proximal tibia fibula. The knee is grossly unremarkable. Total left knee  arthroplasty. Associated subcutaneus soft tissue edema. IMPRESSION: 1. Acute laterally displaced fracture of the transsyndesmotic distal fibula. 2. Acute laterally displaced medial malleolar fracture. 3. Cortical irregularity of the intra-articular anterior tibia. Recommend dedicated left ankle radiograph. 4. No acute displaced fracture or dislocation of the proximal tibia fibula in the setting of a total left knee arthroplasty. 5. No acute displaced fracture or dislocation of the bones of the left foot. Electronically Signed   By: Iven Finn M.D.   On: 08/16/2021 15:11    Positive ROS: All other systems have been reviewed and were otherwise negative with the exception of those mentioned in the HPI and as above.  Physical Exam: General: Alert, no acute distress  MUSCULOSKELETAL: Left ankle:  Patient's ankle has slight valgus deformity.  Skin is intact, but ecchymosis is seen with mild swelling.  He was diminished sensation to light tough in both feet.  He has palpable pedal pulses and can slight dorsiflex and plantarflex his ankle.  His toes are perfused.  His leg and foot compartments are soft and compressible.    Assessment: Left bimalleolar fracture with lateral talar subluxation   Plan: I personally placed the patient in an AO splint.   I explained to the patient and his wife that he should have surgical fixation to stabilize the left ankle fracture.   However, the patient is on Eliquis and will have to wait until Friday to have his surgery.  The patient is being admitted to the hospitalist service for a syncopal workup.   His eliquis will  be stopped in preparation for surgery.   He will be NPO after midnight on Thurday.  He will have surgery Friday.      Juanell Fairly, MD    08/16/2021 9:53 PM

## 2021-08-16 NOTE — ED Triage Notes (Signed)
Pt presents via [facility] for fall witnessed at home by his wife that lives with him. He was dizzy when walking to the bedroom and fell after he ate some breakfast this am. He has No previous falls in past three months. Denies LOC and pt did not hit his head. Pt has hx of seizures and has not had seizure since 1988. Pt has left ankle injury with bruising and swelling observed and denies pain when not moving but is painful when putting pressure on it. 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 No previous n/v but did vomit large amount in ambulance. Pt is on Eliquis currently. 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 Orthostatics performed via EMS and was 150 sitting to 104 standing for systolic BP.  Pt had pulse upon palpation via EMS. Pt given 4 mg Zofran via ambulance.  EMS vitals: HR 38, CBG 181, systolic BP 150, temp 98.3.

## 2021-08-16 NOTE — H&P (Signed)
[facility]   PATIENT NAME: [name]    MR#:  [id number]  DATE OF BIRTH:  May 16, 1941  DATE OF ADMISSION:  08/16/2021  PRIMARY CARE PHYSICIAN: [name], MD   Patient is coming from: Home  REQUESTING/REFERRING PHYSICIAN: Teodoro Spray, PA   CHIEF COMPLAINT:   Chief Complaint  Patient presents with  . Fall    Left ankle injury    HISTORY OF PRESENT ILLNESS:  [name] is a 80 y.o. Caucasian male with medical history significant for hypertension, dyslipidemia, migraines, chronic atrial fibrillation, and chronic low back pain, who presented to the ER with acute onset of fall with associated syncope and left ankle injury and subsequent pain.  The patient was walking back from her bathroom and suddenly experienced lightheadedness and diaphoresis then passed out falling to the floor.  His wife stated that he was diaphoretic.  He does not think he hit his head.  No witnessed seizures.  No chest pain or palpitations or cough or wheezing or dyspnea.  No nausea or vomiting or abdominal pain.  No paresthesias or focal muscle weakness before or after his fall.  He denies any melena or bright red bleeding per rectum.  No nausea vomiting or abdominal pain.  He admitted to fatigue and tiredness.  ED Course: Upon presentation to the emergency room vital signs were within normal.  Labs revealed CMP remarkable for creatinine 1.25 and CBC remarkable for an H&H of 7.3/25.3 with low RBC indicis, compared to 13.6 and 41.1 on 02/18/2019.  INR was 1.4 with PT 17 and PTT 23.  Blood group was O+ with negative antibody screen.  Influenza antigens and COVID-19 PCR came back negative.  Stool Hemoccult came back positive. EKG as reviewed by me :  EKG showed atrial fibrillation with a rate of 64 with nonspecific IVCD with LAD.  He has poor R wave progression. Imaging: Left ankle x-ray showed acute laterally displaced oblique fracture of the distal fibula involving the syndesmosis and  acute laterally displaced medial malleolar fracture and ill-defined cortical irregularity in the intra-articular anterior tibia possibly representing fracture given trauma and small effusion. Chest and abdomen CT with contrast revealed: 1. No evidence of pulmonary embolus. 2. Cardiomegaly, with no evidence of pericardial effusion. 3. No acute intrathoracic, intra-abdominal, or intrapelvic trauma. 4. Punctate bilateral nonobstructing renal calculi. 5.  Aortic Atherosclerosis.  The patient was given 0.5 mg IV Dilaudid, 12.5 mg IV Phenergan and 1 L bolus of IV normal saline.  He will be admitted to a medical telemetry bed for further evaluation and management.  PAST MEDICAL HISTORY:   Past Medical History:  Diagnosis Date  . Arthritis    "lower back on down; into all joints; into my feet" (09/17/2012)  . Chronic low back pain    hx of  . Complication of anesthesia    "cardiac arrest with dye from [place] (04/15/10 records indicate bradycardia, no ischemia, EF 56%), unstable BP with anesthesia)  . GERD (gastroesophageal reflux disease)   . [place]; 1989   "take RX daily" (09/17/2012)  . Herniated disc, cervical   . Hypercholesteremia   . Hypertension    sees Dr. [name], [facility] in [place]  . Intracranial bleed [facility])    december 2010  . Left leg DVT ([place]) 09/2019  . Migraines    "2 in my life" (09/17/2012)  . Mitral regurgitation    a. 01/2019 Echo: EF 55-60%, mild conc LVH. RVSP 59.28mmHg. Mildly  dil LA. Mild to mod MR. Mild AI.  Marland Kitchen PAH (pulmonary artery hypertension) ([place])   . Permanent atrial fibrillation (HCC)    a. CHA2DS2VASc = 5-->No OAC 2/2 h/o SAH; b. 03/2019 [facility]: permanent Afib - 78 bpm (46-135). Occas PVCs (1.4% burden).  . Retina disorder    hx of torn retina  . Stroke ([place]) 2002   LLE "just a little bit weaker" (09/17/2012)  . Subarachnoid hemorrhage ([place]) 08/2009    PAST SURGICAL HISTORY:   Past Surgical History:  Procedure Laterality Date  .  BLEPHAROPLASTY  1990's   "right eye" (09/16/2012)  . CARDIOVASCULAR STRESS TEST     nl perfusion w/o ischemia, EF 56%, bradycardia without syncope 04/15/10 (Dr. Bartholome Bill)  . CATARACT EXTRACTION W/ INTRAOCULAR LENS IMPLANT  1980's   "right" (09/17/2012)  . COLONOSCOPY     06/26/09  . EYE MUSCLE SURGERY  1980's   "right eye" (09/17/2012)  . INGUINAL HERNIA REPAIR  04/10/2011   "right" (09/17/2012)  . JOINT REPLACEMENT    . PERIPHERAL VASCULAR THROMBECTOMY Left 10/27/2019   Procedure: PERIPHERAL VASCULAR THROMBECTOMY;  Surgeon: Algernon Huxley, MD;  Location: [facility];  Service: Cardiovascular;  Laterality: Left;  . POSTERIOR LUMBAR FUSION  09/16/2012   "L4-5" (09/17/2012)  . RETINAL DETACHMENT SURGERY  1980's   "right" (09/17/2012)  . TOTAL KNEE ARTHROPLASTY  04/28/2010   "left" (09/17/2012)    SOCIAL HISTORY:   Social History   Tobacco Use  . Smoking status: Never  . Smokeless tobacco: Never  Substance Use Topics  . Alcohol use: No    FAMILY HISTORY:   Family History  Problem Relation Age of Onset  . Heart disease Mother     DRUG ALLERGIES:   Allergies  Allergen Reactions  . Codeine Nausea Only and Other (See Comments)  . Fentanyl Other (See Comments)    Given before knee surgery, blood pressure became extremely low  . Regadenoson Other (See Comments)    Created cardiac arrest  . Verapamil Other (See Comments)    Per patient: Medication has caused low heart rate and caused patient to have afib that he currently has    REVIEW OF SYSTEMS:   ROS As per history of present illness. All pertinent systems were reviewed above. Constitutional, HEENT, cardiovascular, respiratory, GI, GU, musculoskeletal, neuro, psychiatric, endocrine, integumentary and hematologic systems were reviewed and are otherwise negative/unremarkable except for positive findings mentioned above in the HPI.   MEDICATIONS AT HOME:   Prior to Admission medications   Medication Sig Start  Date End Date Taking? Authorizing Provider  aliskiren (TEKTURNA) 300 MG tablet Take 300 mg by mouth daily.    Yes [provider]  amoxicillin (AMOXIL) 500 MG tablet Take 500 mg by mouth as directed. Dental procedure only   Yes [provider]  carbamazepine (TEGRETOL) 200 MG tablet Take 200 mg by mouth 2 (two) times a day.    Yes [provider]  doxazosin (CARDURA) 1 MG tablet Take 1 mg by mouth at bedtime.    Yes [provider]  ELIQUIS 5 MG TABS tablet Take 5 mg by mouth 2 (two) times daily. 10/06/19  Yes [provider]  furosemide (LASIX) 20 MG tablet Take 2 tablets (40 mg total) by mouth daily. 05/27/19  Yes Theora Gianotti, NP  irbesartan (AVAPRO) 300 MG tablet Take 300 mg by mouth daily. 11/19/17  Yes [provider]  Omega-3 Fatty Acids (FISH OIL) 1200 MG CAPS Take 1,200 mg  by mouth daily.   Yes [provider]  pantoprazole (PROTONIX) 40 MG tablet Take 40 mg by mouth at bedtime.    Yes [provider]  simvastatin (ZOCOR) 20 MG tablet Take 20 mg by mouth 2 (two) times daily.    Yes [provider]  vitamin B-12 (CYANOCOBALAMIN) 1000 MCG tablet Take 1,000 mcg by mouth daily.   Yes [provider]  amLODipine (NORVASC) 2.5 MG tablet Take 2.5 mg by mouth daily. Patient not taking: Reported on 08/16/2021 02/19/21   [provider]      VITAL SIGNS:  Blood pressure (!) 165/88, pulse 67, temperature 97.7 F (36.5 C), temperature source Oral, resp. rate 20, height 6' (1.829 m), weight 101 kg, SpO2 97 %.  PHYSICAL EXAMINATION:  Physical Exam  GENERAL:  80 y.o.-year-old male Caucasian male patient lying in the bed with no acute distress.  EYES: Pupils equal, round, reactive to light and accommodation. No scleral icterus.  Positive pallor.  Extraocular muscles intact.  HEENT: Head atraumatic, normocephalic. Oropharynx and nasopharynx clear.  NECK:  Supple, no jugular venous distention.  No thyroid enlargement, no tenderness.  LUNGS: Normal breath sounds bilaterally, no wheezing, rales,rhonchi or crepitation. No use of accessory muscles of respiration.  CARDIOVASCULAR: Regular rate and rhythm, S1, S2 normal. No murmurs, rubs, or gallops.  ABDOMEN: Soft, nondistended, nontender. Bowel sounds present. No organomegaly or mass.  EXTREMITIES: No pedal edema, cyanosis, or clubbing.  NEUROLOGIC: Cranial nerves II through XII are intact. Muscle strength 5/5 in all extremities. Sensation intact. Gait not checked.  PSYCHIATRIC: The patient is alert and oriented x 3.  Normal affect and good eye contact. SKIN: No obvious rash, lesion, or ulcer.   LABORATORY PANEL:   CBC Recent Labs  Lab 08/16/21 1411  WBC 7.4  HGB 7.3*  HCT 25.3*  PLT 182   ------------------------------------------------------------------------------------------------------------------  Chemistries  Recent Labs  Lab 08/16/21 1411  NA 136  K 3.9  CL 105  CO2 26  GLUCOSE 120*  BUN 23  CREATININE 1.25*  CALCIUM 8.8*  AST 16  ALT 9  ALKPHOS 59  BILITOT 0.6   ------------------------------------------------------------------------------------------------------------------  Cardiac Enzymes No results for input(s): TROPONINI in the last 168 hours. ------------------------------------------------------------------------------------------------------------------  RADIOLOGY:  DG Chest 2 View  Result Date: 08/16/2021 CLINICAL DATA:  80 year old male with a history of syncope EXAM: CHEST - 2 VIEW COMPARISON:  02/17/2019 FINDINGS: The cardiac diameter appears enlarged compared to the prior, with more globular configuration and straightening of the left heart border. Stigmata of emphysema, with increased retrosternal airspace, flattened hemidiaphragms, increased AP diameter, and hyperinflation on the AP view. Hazy opacity at the bilateral lung bases with partial obscuration of the hemidiaphragms and the heart  borders. Coarsened interstitial markings bilaterally. No pneumothorax. Degenerative changes of the spine.  No acute displaced fracture IMPRESSION: Increased diameter of the heart with a configuration suspicious for pericardial effusion. Correlation with either chest CT or cardiac echo may be considered. Opacities at the bilateral lung bases, likely a combination of pleural effusion and associated atelectasis/consolidation. Coarsened interstitial markings, potentially early pulmonary edema Electronically Signed   By: Gilmer Mor D.O.   On: 08/16/2021 15:09   DG Tibia/Fibula Left  Result Date: 08/16/2021 CLINICAL DATA:  Pain after fall. EXAM: LEFT TIBIA AND FIBULA - 2 VIEW; LEFT FOOT - COMPLETE 3+ VIEW COMPARISON:  X-ray left tibia fibula and left femur 01/30/2019 FINDINGS: Right foot: No evidence of fracture, dislocation, or joint effusion. No evidence of severe arthropathy. No aggressive  appearing focal bone abnormality. Subcutaneus soft tissue edema. Right tibia fibula: Acute laterally displaced fracture of the transsyndesmotic distal fibula. Acute laterally displaced medial malleolar fracture. No definite dislocation of the left ankle. Cortical irregularity of the intra-articular anterior tibia. No acute displaced fracture or dislocation of the proximal tibia fibula. The knee is grossly unremarkable. Total left knee arthroplasty. Associated subcutaneus soft tissue edema. IMPRESSION: 1. Acute laterally displaced fracture of the transsyndesmotic distal fibula. 2. Acute laterally displaced medial malleolar fracture. 3. Cortical irregularity of the intra-articular anterior tibia. Recommend dedicated left ankle radiograph. 4. No acute displaced fracture or dislocation of the proximal tibia fibula in the setting of a total left knee arthroplasty. 5. No acute displaced fracture or dislocation of the bones of the left foot. Electronically Signed   By: Iven Finn M.D.   On: 08/16/2021 15:11   DG Ankle  Complete Left  Result Date: 08/16/2021 CLINICAL DATA:  Radiologist recommended dedicated ankle film to further evaluate injury EXAM: LEFT ANKLE COMPLETE - 3+ VIEW COMPARISON:  Same day radiographs. FINDINGS: Redemonstrated acute laterally displaced oblique fracture of the distal fibula involving the syndesmosis. Redemonstrated acute laterally displaced medial malleolar fracture. Similar ill-defined cortical regularity of the intra-articular anterior tibia. Possible small joint effusion. Vascular calcifications. Calcaneal enthesophytes. IMPRESSION: 1. Redemonstrated acute laterally displaced oblique fracture of the distal fibula involving the syndesmosis. 2. Redemonstrated acute laterally displaced medial malleolar fracture. 3. Similar ill-defined cortical regularity of the intra-articular anterior tibia, possibly representing fracture given trauma and small effusion. CT of the ankle could confirm and better evaluate if clinically indicated. Electronically Signed   By: Margaretha Sheffield M.D.   On: 08/16/2021 16:20   CT Head Wo Contrast  Result Date: 08/16/2021 CLINICAL DATA:  Fall, head trauma EXAM: CT HEAD WITHOUT CONTRAST TECHNIQUE: Contiguous axial images were obtained from the base of the skull through the vertex without intravenous contrast. COMPARISON:  CT head 08/06/2019 FINDINGS: Brain: No acute intracranial hemorrhage, mass effect, or herniation. No extra-axial fluid collections. No evidence of acute territorial infarct. No hydrocephalus. Mild-to-moderate cortical volume loss. Patchy hypodensities in the periventricular and subcortical white matter, likely secondary to chronic microvascular ischemic changes. Vascular: Calcified plaques in the carotid siphons. Skull: Normal. Negative for fracture or focal lesion. Sinuses/Orbits: No acute finding. Other: None. IMPRESSION: Chronic changes with no acute intracranial process identified. Electronically Signed   By: Ofilia Neas M.D.   On: 08/16/2021  14:41   CT Angio Chest PE W and/or Wo Contrast  Result Date: 08/16/2021 CLINICAL DATA:  Witnessed fall, dizziness, anemia EXAM: CT ANGIOGRAPHY CHEST CT ABDOMEN AND PELVIS WITH CONTRAST TECHNIQUE: Multidetector CT imaging of the chest was performed using the standard protocol during bolus administration of intravenous contrast. Multiplanar CT image reconstructions and MIPs were obtained to evaluate the vascular anatomy. Multidetector CT imaging of the abdomen and pelvis was performed using the standard protocol during bolus administration of intravenous contrast. CONTRAST:  1107mL OMNIPAQUE IOHEXOL 350 MG/ML SOLN COMPARISON:  08/16/2021 FINDINGS: CTA CHEST FINDINGS Cardiovascular: This is a technically adequate evaluation of the pulmonary vasculature. No filling defects or pulmonary emboli. The heart is enlarged, with prominent biatrial dilation. No pericardial effusion. Extensive atherosclerosis of the coronary vasculature. No evidence of thoracic aortic aneurysm or dissection. Mild aortic atherosclerosis. Mediastinum/Nodes: No enlarged mediastinal, hilar, or axillary lymph nodes. Thyroid gland, trachea, and esophagus demonstrate no significant findings. Lungs/Pleura: No airspace disease, effusion, or pneumothorax. Central airways are patent. Musculoskeletal: No acute displaced fractures. Reconstructed images demonstrate no additional findings. Review  of the MIP images confirms the above findings. CT ABDOMEN and PELVIS FINDINGS Hepatobiliary: No focal liver abnormality is seen. No gallstones, gallbladder wall thickening, or biliary dilatation. Pancreas: Unremarkable. No pancreatic ductal dilatation or surrounding inflammatory changes. Spleen: Normal in size without focal abnormality. 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 Mild nonspecific thickening of the left adrenal gland measuring up to 1.4 cm. 4 mm nonobstructing calculus mid right kidney. 2 mm nonobstructing calculus lower pole left  kidney. There is mild bilateral renal cortical thinning. No obstructive uropathy. The bladder is unremarkable. Stomach/Bowel: No bowel obstruction or ileus. Normal appendix right lower quadrant. No bowel wall thickening or inflammatory change. Vascular/Lymphatic: Aortic atherosclerosis. No enlarged abdominal or pelvic lymph nodes. Reproductive: Prostate is unremarkable. Other: No free fluid or free gas.  No abdominal wall hernia. Musculoskeletal: No acute displaced fractures. Postsurgical changes at L4-5. Reconstructed images demonstrate no additional findings. Review of the MIP images confirms the above findings. IMPRESSION: 1. No evidence of pulmonary embolus. 2. Cardiomegaly, with no evidence of pericardial effusion. 3. No acute intrathoracic, intra-abdominal, or intrapelvic trauma. 4. Punctate bilateral nonobstructing renal calculi. 5.  Aortic Atherosclerosis (ICD10-I70.0). Electronically Signed   By: Randa Ngo M.D.   On: 08/16/2021 16:04   CT Cervical Spine Wo Contrast  Result Date: 08/16/2021 CLINICAL DATA:  Neck pain EXAM: CT CERVICAL SPINE WITHOUT CONTRAST TECHNIQUE: Multidetector CT imaging of the cervical spine was performed without intravenous contrast. Multiplanar CT image reconstructions were also generated. COMPARISON:  MRI cervical spine 08/18/2014 FINDINGS: Alignment: Grade 1 anterolisthesis of C3 on C4 and C4 on C5. Skull base and vertebrae: No acute fracture. No primary bone lesion or focal pathologic process. Soft tissues and spinal canal: No prevertebral fluid or swelling. No visible canal hematoma. Disc levels: Moderate intervertebral disc height loss at C5-C6 and mild-to-moderate at C6-C7. Dorsal endplate osteophytes and uncovertebral spurring most prominent at C5-C6. Facet arthropathy which is worse on the right. Multilevel neural foraminal narrowing most significant at C5-C6. Upper chest: No acute process identified. Other: None. IMPRESSION: Chronic degenerative changes with no  acute fracture or subluxation identified. Electronically Signed   By: Ofilia Neas M.D.   On: 08/16/2021 14:44   CT Abdomen Pelvis W Contrast  Result Date: 08/16/2021 CLINICAL DATA:  Witnessed fall, dizziness, anemia EXAM: CT ANGIOGRAPHY CHEST CT ABDOMEN AND PELVIS WITH CONTRAST TECHNIQUE: Multidetector CT imaging of the chest was performed using the standard protocol during bolus administration of intravenous contrast. Multiplanar CT image reconstructions and MIPs were obtained to evaluate the vascular anatomy. Multidetector CT imaging of the abdomen and pelvis was performed using the standard protocol during bolus administration of intravenous contrast. CONTRAST:  177mL OMNIPAQUE IOHEXOL 350 MG/ML SOLN COMPARISON:  08/16/2021 FINDINGS: CTA CHEST FINDINGS Cardiovascular: This is a technically adequate evaluation of the pulmonary vasculature. No filling defects or pulmonary emboli. The heart is enlarged, with prominent biatrial dilation. No pericardial effusion. Extensive atherosclerosis of the coronary vasculature. No evidence of thoracic aortic aneurysm or dissection. Mild aortic atherosclerosis. Mediastinum/Nodes: No enlarged mediastinal, hilar, or axillary lymph nodes. Thyroid gland, trachea, and esophagus demonstrate no significant findings. Lungs/Pleura: No airspace disease, effusion, or pneumothorax. Central airways are patent. Musculoskeletal: No acute displaced fractures. Reconstructed images demonstrate no additional findings. Review of the MIP images confirms the above findings. CT ABDOMEN and PELVIS FINDINGS Hepatobiliary: No focal liver abnormality is seen. No gallstones, gallbladder wall thickening, or biliary dilatation. Pancreas: Unremarkable. No pancreatic ductal dilatation or surrounding inflammatory changes. Spleen: Normal in size without focal abnormality.  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 Mild nonspecific thickening of the left adrenal gland measuring up to 1.4  cm. 4 mm nonobstructing calculus mid right kidney. 2 mm nonobstructing calculus lower pole left kidney. There is mild bilateral renal cortical thinning. No obstructive uropathy. The bladder is unremarkable. Stomach/Bowel: No bowel obstruction or ileus. Normal appendix right lower quadrant. No bowel wall thickening or inflammatory change. Vascular/Lymphatic: Aortic atherosclerosis. No enlarged abdominal or pelvic lymph nodes. Reproductive: Prostate is unremarkable. Other: No free fluid or free gas.  No abdominal wall hernia. Musculoskeletal: No acute displaced fractures. Postsurgical changes at L4-5. Reconstructed images demonstrate no additional findings. Review of the MIP images confirms the above findings. IMPRESSION: 1. No evidence of pulmonary embolus. 2. Cardiomegaly, with no evidence of pericardial effusion. 3. No acute intrathoracic, intra-abdominal, or intrapelvic trauma. 4. Punctate bilateral nonobstructing renal calculi. 5.  Aortic Atherosclerosis (ICD10-I70.0). Electronically Signed   By: Randa Ngo M.D.   On: 08/16/2021 16:04   DG Foot Complete Left  Result Date: 08/16/2021 CLINICAL DATA:  Pain after fall. EXAM: LEFT TIBIA AND FIBULA - 2 VIEW; LEFT FOOT - COMPLETE 3+ VIEW COMPARISON:  X-ray left tibia fibula and left femur 01/30/2019 FINDINGS: Right foot: No evidence of fracture, dislocation, or joint effusion. No evidence of severe arthropathy. No aggressive appearing focal bone abnormality. Subcutaneus soft tissue edema. Right tibia fibula: Acute laterally displaced fracture of the transsyndesmotic distal fibula. Acute laterally displaced medial malleolar fracture. No definite dislocation of the left ankle. Cortical irregularity of the intra-articular anterior tibia. No acute displaced fracture or dislocation of the proximal tibia fibula. The knee is grossly unremarkable. Total left knee arthroplasty. Associated subcutaneus soft tissue edema. IMPRESSION: 1. Acute laterally displaced fracture  of the transsyndesmotic distal fibula. 2. Acute laterally displaced medial malleolar fracture. 3. Cortical irregularity of the intra-articular anterior tibia. Recommend dedicated left ankle radiograph. 4. No acute displaced fracture or dislocation of the proximal tibia fibula in the setting of a total left knee arthroplasty. 5. No acute displaced fracture or dislocation of the bones of the left foot. Electronically Signed   By: Iven Finn M.D.   On: 08/16/2021 15:11      IMPRESSION AND PLAN:  Principal Problem:   GI bleeding  1.  GI bleeding with acute blood loss anemia. - The patient be admitted to a medical telemetry bed. - He was typed and crossmatched and will be transfused 2 units of packed red blood cells. - We will follow posttransfusion H&H as well as serial levels. - The patient will be given IV Protonix with bolus and drip. - Eliquis will be held off. - GI consultation will be obtained. - I notified Dr. Haig Prophet about the patient.  2.  Syncope secondary to symptomatic anemia with subsequent fall and left ankle fracture. - The patient had a posterior splint placed. - Pain management to be provided. - Orthopedic consultation will be obtained. - Dr. Mack Guise was notified about the patient.  3.  Essential hypertension. - Resume his antihypertensives.  4.  Dyslipidemia. - We will continue statin therapy.  5.  Vitamin B12 deficiency. - We will continue vitamin B12.  6.  Chronic atrial fibrillation. - We will hold off his Eliquis as mentioned above.  DVT prophylaxis: SCDs.  Medical prophylaxis currently contraindicated due to GI bleeding Code Status: full code. Family Communication:  The plan of care was discussed in details with the patient (and family). I answered all questions. The patient agreed to proceed with the above  mentioned plan. Further management will depend upon hospital course. Disposition Plan: Back to previous home environment Consults called:  Gastroenterology. All the records are reviewed and case discussed with ED provider.  Status is: Inpatient   Remains inpatient appropriate because:Ongoing diagnostic testing needed not appropriate for outpatient work up, Unsafe d/c plan, IV treatments appropriate due to intensity of illness or inability to take PO, and Inpatient level of care appropriate due to severity of illness   Dispo: The patient is from: Home              Anticipated d/c is to: Home              Patient currently is not medically stable to d/c.              Difficult to place patient: No  TOTAL TIME TAKING CARE OF THIS PATIENT: 55 minutes.     Christel Mormon M.D on 08/16/2021 at 8:15 PM  [facility]   From 7 PM-7 AM, contact night-coverage [email or web address]  CC: Primary care physician; [name], MD

## 2021-08-17 ENCOUNTER — Encounter
Admission: EM | Disposition: A | Discharge status: Skilled Nursing Facility | Payer: Self-pay | Source: Home / Self Care | Attending: Internal Medicine

## 2021-08-17 ENCOUNTER — Encounter: Payer: Self-pay | Admitting: Family Medicine

## 2021-08-17 LAB — BASIC METABOLIC PANEL
Anion gap: 4 — ABNORMAL LOW (ref 5–15)
BUN: 19 mg/dL (ref 8–23)
CO2: 27 mmol/L (ref 22–32)
Calcium: 8.7 mg/dL — ABNORMAL LOW (ref 8.9–10.3)
Chloride: 106 mmol/L (ref 98–111)
Creatinine, Ser: 0.99 mg/dL (ref 0.61–1.24)
GFR, Estimated: >60 mL/min (ref >60)
Glucose, Bld: 86 mg/dL (ref 70–99)
Potassium: 4.7 mmol/L (ref 3.5–5.1)
Sodium: 137 mmol/L (ref 135–145)

## 2021-08-17 LAB — CBC
HCT: 25.5 % — ABNORMAL LOW (ref 39.0–52.0)
Hemoglobin: 7.6 g/dL — ABNORMAL LOW (ref 13.0–17.0)
MCH: 22.4 pg — ABNORMAL LOW (ref 26.0–34.0)
MCHC: 29.8 g/dL — ABNORMAL LOW (ref 30.0–36.0)
MCV: 75.2 fL — ABNORMAL LOW (ref 80.0–100.0)
Platelets: 181 10*3/uL (ref 150–400)
RBC: 3.39 MIL/uL — ABNORMAL LOW (ref 4.22–5.81)
RDW: 17.1 % — ABNORMAL HIGH (ref 11.5–15.5)
WBC: 10.1 10*3/uL (ref 4.0–10.5)
nRBC: 0 % (ref 0.0–0.2)

## 2021-08-17 LAB — HEMOGLOBIN AND HEMATOCRIT, BLOOD
HCT: 27.5 % — ABNORMAL LOW (ref 39.0–52.0)
HCT: 29.2 % — ABNORMAL LOW (ref 39.0–52.0)
HCT: 27.5 % — ABNORMAL LOW (ref 39.0–52.0)
Hemoglobin: 8.5 g/dL — ABNORMAL LOW (ref 13.0–17.0)
Hemoglobin: 8.8 g/dL — ABNORMAL LOW (ref 13.0–17.0)
Hemoglobin: 8.3 g/dL — ABNORMAL LOW (ref 13.0–17.0)

## 2021-08-17 SURGERY — OPEN REDUCTION INTERNAL FIXATION (ORIF) ANKLE FRACTURE
Anesthesia: Choice | Site: Ankle | Laterality: Left

## 2021-08-17 NOTE — Progress Notes (Signed)
Progress Note    [name]  YKZ:[id number] DOB: [birth year]  DOA: 08/16/2021 PCP: Jerl Mina, MD      Brief Narrative:    Medical records reviewed and are as summarized below:  [name] is a 80 y.o. male with medical history significant for left lower extremity DVT in 2011, subarachnoid hemorrhage in 2010, hypertension, dyslipidemia, migraine, chronic atrial fibrillation, chronic low back pain, who presented to the hospital because of syncope leading to a fall and left ankle injury.      Assessment/Plan:   Principal Problem:   GI bleeding   Body mass index is 30.92 kg/m.  (Obesity)  S/p syncope: Probably from anemia.  Acute blood loss anemia: H&H improved.  S/p transfusion with 2 units of PRBCs.  Eliquis has been held.  Positive heme stool: Continue Protonix.  Follow-up with GI.  Left ankle fracture: Analgesics as needed for pain.  Plan for surgical intervention on 08/19/2021.  Follow-up with orthopedic surgeon.  Other comorbidities include hypertension, dyslipidemia, vitamin B12 deficiency, atrial fibrillation on Eliquis    Diet Order             Diet NPO time specified  Diet effective midnight                      Consultants: Gastroenterologist Orthopedic surgeon  Procedures: None    Medications:    aliskiren  300 mg Oral Daily   carbamazepine  200 mg Oral BID   doxazosin  1 mg Oral QHS   irbesartan  300 mg Oral Daily   omega-3 acid ethyl esters  1 g Oral Daily   [START ON 08/20/2021] pantoprazole  40 mg Intravenous Q12H   simvastatin  20 mg Oral BID   vitamin B-12  1,000 mcg Oral Daily   Continuous Infusions:  sodium chloride 100 mL/hr at 08/17/21 0814   pantoprazole 8 mg/hr (08/17/21 0918)   promethazine (PHENERGAN) injection (IM or IVPB) Stopped (08/16/21 1903)     Anti-infectives (From admission, onward)    None              Family Communication/Anticipated D/C date and plan/Code Status    DVT prophylaxis: Place and maintain sequential compression device Start: 08/16/21 2032 SCDs Start: 08/16/21 2010     Code Status: Full Code  Family Communication: Plan discussed with his wife at the bedside Disposition Plan: Discharge to home versus SNF in 3 to 4 days   Status is: Inpatient  Remains inpatient appropriate because: Left ankle fracture awaiting surgery           Subjective:   Interval events noted.  He complains of pain in the left ankle.  No dizziness, palpitations, shortness of breath, rectal bleeding, vomiting, abdominal pain  Objective:    Vitals:   08/17/21 0656 08/17/21 0657 08/17/21 0745 08/17/21 1140  BP: 138/75 138/75 (!) 150/87 (!) 147/76  Pulse: 81 81 90 79  Resp: 20  15 16   Temp: (!) 97.4 F (36.3 C) (!) 97.4 F (36.3 C) 98.5 F (36.9 C) 98.2 F (36.8 C)  TempSrc: Oral Oral    SpO2: 94% 94% 96% 95%  Weight:      Height:       No data found.   Intake/Output Summary (Last 24 hours) at 08/17/2021 1159 Last data filed at 08/17/2021 0759 Gross per 24 hour  Intake 1961.16 ml  Output 1000 ml  Net 961.16 ml   Filed Weights   08/16/21  1319 08/17/21 0300  Weight: 101 kg 103.4 kg    Exam:  GEN: NAD SKIN: Warm and dry EYES: No pallor or icterus ENT: MMM CV: RRR PULM: CTA B ABD: soft, ND, NT, +BS CNS: AAO x 3, non focal EXT: Cast on the left lower extremity        Data Reviewed:   I have personally reviewed following labs and imaging studies:  Labs: Labs show the following:   Basic Metabolic Panel: Recent Labs  Lab 08/16/21 1411 08/17/21 0420  NA 136 137  K 3.9 4.7  CL 105 106  CO2 26 27  GLUCOSE 120* 86  BUN 23 19  CREATININE 1.25* 0.99  CALCIUM 8.8* 8.7*   GFR Estimated Creatinine Clearance: 72.8 mL/min (by C-G formula based on SCr of 0.99 mg/dL). Liver Function Tests: Recent Labs  Lab 08/16/21 1411  AST 16  ALT 9  ALKPHOS 59  BILITOT 0.6  PROT 6.7  ALBUMIN 3.8   No results for input(s):  LIPASE, AMYLASE in the last 168 hours. No results for input(s): AMMONIA in the last 168 hours. Coagulation profile Recent Labs  Lab 08/16/21 1411  INR 1.4*    CBC: Recent Labs  Lab 08/16/21 1411 08/16/21 2206 08/17/21 0420 08/17/21 0947  WBC 7.4  --  10.1  --   NEUTROABS 6.2  --   --   --   HGB 7.3* 7.5* 7.6* 8.5*  HCT 25.3* 25.6* 25.5* 27.5*  MCV 75.7*  --  75.2*  --   PLT 182  --  181  --    Cardiac Enzymes: No results for input(s): CKTOTAL, CKMB, CKMBINDEX, TROPONINI in the last 168 hours. BNP (last 3 results) No results for input(s): PROBNP in the last 8760 hours. CBG: Recent Labs  Lab 08/16/21 1405  GLUCAP 116*   D-Dimer: No results for input(s): DDIMER in the last 72 hours. Hgb A1c: No results for input(s): HGBA1C in the last 72 hours. Lipid Profile: No results for input(s): CHOL, HDL, LDLCALC, TRIG, CHOLHDL, LDLDIRECT in the last 72 hours. Thyroid function studies: No results for input(s): TSH, T4TOTAL, T3FREE, THYROIDAB in the last 72 hours.  Invalid input(s): FREET3 Anemia work up: No results for input(s): VITAMINB12, FOLATE, FERRITIN, TIBC, IRON, RETICCTPCT in the last 72 hours. Sepsis Labs: Recent Labs  Lab 08/16/21 1411 08/17/21 0420  WBC 7.4 10.1    Microbiology Recent Results (from the past 240 hour(s))  Resp Panel by RT-PCR (Flu A&B, Covid) Nasopharyngeal Swab     Status: None   Collection Time: 08/16/21  6:24 PM   Specimen: Nasopharyngeal Swab; Nasopharyngeal(NP) swabs in vial transport medium  Result Value Ref Range Status   SARS Coronavirus 2 by RT PCR NEGATIVE NEGATIVE Final    Comment: (NOTE) SARS-CoV-2 target nucleic acids are NOT DETECTED.  The SARS-CoV-2 RNA is generally detectable in upper respiratory specimens during the acute phase of infection. The lowest concentration of SARS-CoV-2 viral copies this assay can detect is 138 copies/mL. A negative result does not preclude SARS-Cov-2 infection and should not be used as the  sole basis for treatment or other patient management decisions. A negative result may occur with  improper specimen collection/handling, submission of specimen other than nasopharyngeal swab, presence of viral mutation(s) within the areas targeted by this assay, and inadequate number of viral copies(<138 copies/mL). A negative result must be combined with clinical observations, patient history, and epidemiological information. The expected result is Negative.  Fact Sheet for Patients:  EntrepreneurPulse.com.au  Fact Sheet  for Healthcare Providers:  IncredibleEmployment.be  This test is no t yet approved or cleared by the [place] and  has been authorized for detection and/or diagnosis of SARS-CoV-2 by FDA under an Emergency Use Authorization (EUA). This EUA will remain  in effect (meaning this test can be used) for the duration of the COVID-19 declaration under Section 564(b)(1) of the Act, 21 U.S.C.section 360bbb-3(b)(1), unless the authorization is terminated  or revoked sooner.       Influenza A by PCR NEGATIVE NEGATIVE Final   Influenza B by PCR NEGATIVE NEGATIVE Final    Comment: (NOTE) The Xpert Xpress SARS-CoV-2/FLU/RSV plus assay is intended as an aid in the diagnosis of influenza from Nasopharyngeal swab specimens and should not be used as a sole basis for treatment. Nasal washings and aspirates are unacceptable for Xpert Xpress SARS-CoV-2/FLU/RSV testing.  Fact Sheet for Patients: EntrepreneurPulse.com.au  Fact Sheet for Healthcare Providers: IncredibleEmployment.be  This test is not yet approved or cleared by the [place] FDA and has been authorized for detection and/or diagnosis of SARS-CoV-2 by FDA under an Emergency Use Authorization (EUA). This EUA will remain in effect (meaning this test can be used) for the duration of the COVID-19 declaration under Section 564(b)(1) of the  Act, 21 U.S.C. section 360bbb-3(b)(1), unless the authorization is terminated or revoked.  Performed at [facility], [address] [place], [place] [address]     Procedures and diagnostic studies:  DG Chest 2 View  Result Date: 08/16/2021 CLINICAL DATA:  80 year old male with a history of syncope EXAM: CHEST - 2 VIEW COMPARISON:  02/17/2019 FINDINGS: The cardiac diameter appears enlarged compared to the prior, with more globular configuration and straightening of the left heart border. Stigmata of emphysema, with increased retrosternal airspace, flattened hemidiaphragms, increased AP diameter, and hyperinflation on the AP view. Hazy opacity at the bilateral lung bases with partial obscuration of the hemidiaphragms and the heart borders. Coarsened interstitial markings bilaterally. No pneumothorax. Degenerative changes of the spine.  No acute displaced fracture IMPRESSION: Increased diameter of the heart with a configuration suspicious for pericardial effusion. Correlation with either chest CT or cardiac echo may be considered. Opacities at the bilateral lung bases, likely a combination of pleural effusion and associated atelectasis/consolidation. Coarsened interstitial markings, potentially early pulmonary edema Electronically Signed   By: Corrie Mckusick D.O.   On: 08/16/2021 15:09   DG Tibia/Fibula Left  Result Date: 08/16/2021 CLINICAL DATA:  Pain after fall. EXAM: LEFT TIBIA AND FIBULA - 2 VIEW; LEFT FOOT - COMPLETE 3+ VIEW COMPARISON:  X-ray left tibia fibula and left femur 01/30/2019 FINDINGS: Right foot: No evidence of fracture, dislocation, or joint effusion. No evidence of severe arthropathy. No aggressive appearing focal bone abnormality. Subcutaneus soft tissue edema. Right tibia fibula: Acute laterally displaced fracture of the transsyndesmotic distal fibula. Acute laterally displaced medial malleolar fracture. No definite dislocation of the left ankle. Cortical irregularity  of the intra-articular anterior tibia. No acute displaced fracture or dislocation of the proximal tibia fibula. The knee is grossly unremarkable. Total left knee arthroplasty. Associated subcutaneus soft tissue edema. IMPRESSION: 1. Acute laterally displaced fracture of the transsyndesmotic distal fibula. 2. Acute laterally displaced medial malleolar fracture. 3. Cortical irregularity of the intra-articular anterior tibia. Recommend dedicated left ankle radiograph. 4. No acute displaced fracture or dislocation of the proximal tibia fibula in the setting of a total left knee arthroplasty. 5. No acute displaced fracture or dislocation of the bones of the left foot. Electronically Signed  By: Iven Finn M.D.   On: 08/16/2021 15:11   DG Ankle Complete Left  Result Date: 08/16/2021 CLINICAL DATA:  Radiologist recommended dedicated ankle film to further evaluate injury EXAM: LEFT ANKLE COMPLETE - 3+ VIEW COMPARISON:  Same day radiographs. FINDINGS: Redemonstrated acute laterally displaced oblique fracture of the distal fibula involving the syndesmosis. Redemonstrated acute laterally displaced medial malleolar fracture. Similar ill-defined cortical regularity of the intra-articular anterior tibia. Possible small joint effusion. Vascular calcifications. Calcaneal enthesophytes. IMPRESSION: 1. Redemonstrated acute laterally displaced oblique fracture of the distal fibula involving the syndesmosis. 2. Redemonstrated acute laterally displaced medial malleolar fracture. 3. Similar ill-defined cortical regularity of the intra-articular anterior tibia, possibly representing fracture given trauma and small effusion. CT of the ankle could confirm and better evaluate if clinically indicated. Electronically Signed   By: Margaretha Sheffield M.D.   On: 08/16/2021 16:20   CT Head Wo Contrast  Result Date: 08/16/2021 CLINICAL DATA:  Fall, head trauma EXAM: CT HEAD WITHOUT CONTRAST TECHNIQUE: Contiguous axial images were  obtained from the base of the skull through the vertex without intravenous contrast. COMPARISON:  CT head 08/06/2019 FINDINGS: Brain: No acute intracranial hemorrhage, mass effect, or herniation. No extra-axial fluid collections. No evidence of acute territorial infarct. No hydrocephalus. Mild-to-moderate cortical volume loss. Patchy hypodensities in the periventricular and subcortical white matter, likely secondary to chronic microvascular ischemic changes. Vascular: Calcified plaques in the carotid siphons. Skull: Normal. Negative for fracture or focal lesion. Sinuses/Orbits: No acute finding. Other: None. IMPRESSION: Chronic changes with no acute intracranial process identified. Electronically Signed   By: Ofilia Neas M.D.   On: 08/16/2021 14:41   CT Angio Chest PE W and/or Wo Contrast  Result Date: 08/16/2021 CLINICAL DATA:  Witnessed fall, dizziness, anemia EXAM: CT ANGIOGRAPHY CHEST CT ABDOMEN AND PELVIS WITH CONTRAST TECHNIQUE: Multidetector CT imaging of the chest was performed using the standard protocol during bolus administration of intravenous contrast. Multiplanar CT image reconstructions and MIPs were obtained to evaluate the vascular anatomy. Multidetector CT imaging of the abdomen and pelvis was performed using the standard protocol during bolus administration of intravenous contrast. CONTRAST:  164mL OMNIPAQUE IOHEXOL 350 MG/ML SOLN COMPARISON:  08/16/2021 FINDINGS: CTA CHEST FINDINGS Cardiovascular: This is a technically adequate evaluation of the pulmonary vasculature. No filling defects or pulmonary emboli. The heart is enlarged, with prominent biatrial dilation. No pericardial effusion. Extensive atherosclerosis of the coronary vasculature. No evidence of thoracic aortic aneurysm or dissection. Mild aortic atherosclerosis. Mediastinum/Nodes: No enlarged mediastinal, hilar, or axillary lymph nodes. Thyroid gland, trachea, and esophagus demonstrate no significant findings. Lungs/Pleura:  No airspace disease, effusion, or pneumothorax. Central airways are patent. Musculoskeletal: No acute displaced fractures. Reconstructed images demonstrate no additional findings. Review of the MIP images confirms the above findings. CT ABDOMEN and PELVIS FINDINGS Hepatobiliary: No focal liver abnormality is seen. No gallstones, gallbladder wall thickening, or biliary dilatation. Pancreas: Unremarkable. No pancreatic ductal dilatation or surrounding inflammatory changes. Spleen: Normal in size without focal abnormality. 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 Mild nonspecific thickening of the left adrenal gland measuring up to 1.4 cm. 4 mm nonobstructing calculus mid right kidney. 2 mm nonobstructing calculus lower pole left kidney. There is mild bilateral renal cortical thinning. No obstructive uropathy. The bladder is unremarkable. Stomach/Bowel: No bowel obstruction or ileus. Normal appendix right lower quadrant. No bowel wall thickening or inflammatory change. Vascular/Lymphatic: Aortic atherosclerosis. No enlarged abdominal or pelvic lymph nodes. Reproductive: Prostate is unremarkable. Other: No free fluid or free gas.  No  abdominal wall hernia. Musculoskeletal: No acute displaced fractures. Postsurgical changes at L4-5. Reconstructed images demonstrate no additional findings. Review of the MIP images confirms the above findings. IMPRESSION: 1. No evidence of pulmonary embolus. 2. Cardiomegaly, with no evidence of pericardial effusion. 3. No acute intrathoracic, intra-abdominal, or intrapelvic trauma. 4. Punctate bilateral nonobstructing renal calculi. 5.  Aortic Atherosclerosis (ICD10-I70.0). Electronically Signed   By: Randa Ngo M.D.   On: 08/16/2021 16:04   CT Cervical Spine Wo Contrast  Result Date: 08/16/2021 CLINICAL DATA:  Neck pain EXAM: CT CERVICAL SPINE WITHOUT CONTRAST TECHNIQUE: Multidetector CT imaging of the cervical spine was performed without intravenous contrast.  Multiplanar CT image reconstructions were also generated. COMPARISON:  MRI cervical spine 08/18/2014 FINDINGS: Alignment: Grade 1 anterolisthesis of C3 on C4 and C4 on C5. Skull base and vertebrae: No acute fracture. No primary bone lesion or focal pathologic process. Soft tissues and spinal canal: No prevertebral fluid or swelling. No visible canal hematoma. Disc levels: Moderate intervertebral disc height loss at C5-C6 and mild-to-moderate at C6-C7. Dorsal endplate osteophytes and uncovertebral spurring most prominent at C5-C6. Facet arthropathy which is worse on the right. Multilevel neural foraminal narrowing most significant at C5-C6. Upper chest: No acute process identified. Other: None. IMPRESSION: Chronic degenerative changes with no acute fracture or subluxation identified. Electronically Signed   By: Ofilia Neas M.D.   On: 08/16/2021 14:44   CT Abdomen Pelvis W Contrast  Result Date: 08/16/2021 CLINICAL DATA:  Witnessed fall, dizziness, anemia EXAM: CT ANGIOGRAPHY CHEST CT ABDOMEN AND PELVIS WITH CONTRAST TECHNIQUE: Multidetector CT imaging of the chest was performed using the standard protocol during bolus administration of intravenous contrast. Multiplanar CT image reconstructions and MIPs were obtained to evaluate the vascular anatomy. Multidetector CT imaging of the abdomen and pelvis was performed using the standard protocol during bolus administration of intravenous contrast. CONTRAST:  168mL OMNIPAQUE IOHEXOL 350 MG/ML SOLN COMPARISON:  08/16/2021 FINDINGS: CTA CHEST FINDINGS Cardiovascular: This is a technically adequate evaluation of the pulmonary vasculature. No filling defects or pulmonary emboli. The heart is enlarged, with prominent biatrial dilation. No pericardial effusion. Extensive atherosclerosis of the coronary vasculature. No evidence of thoracic aortic aneurysm or dissection. Mild aortic atherosclerosis. Mediastinum/Nodes: No enlarged mediastinal, hilar, or axillary lymph  nodes. Thyroid gland, trachea, and esophagus demonstrate no significant findings. Lungs/Pleura: No airspace disease, effusion, or pneumothorax. Central airways are patent. Musculoskeletal: No acute displaced fractures. Reconstructed images demonstrate no additional findings. Review of the MIP images confirms the above findings. CT ABDOMEN and PELVIS FINDINGS Hepatobiliary: No focal liver abnormality is seen. No gallstones, gallbladder wall thickening, or biliary dilatation. Pancreas: Unremarkable. No pancreatic ductal dilatation or surrounding inflammatory changes. Spleen: Normal in size without focal abnormality. 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 Mild nonspecific thickening of the left adrenal gland measuring up to 1.4 cm. 4 mm nonobstructing calculus mid right kidney. 2 mm nonobstructing calculus lower pole left kidney. There is mild bilateral renal cortical thinning. No obstructive uropathy. The bladder is unremarkable. Stomach/Bowel: No bowel obstruction or ileus. Normal appendix right lower quadrant. No bowel wall thickening or inflammatory change. Vascular/Lymphatic: Aortic atherosclerosis. No enlarged abdominal or pelvic lymph nodes. Reproductive: Prostate is unremarkable. Other: No free fluid or free gas.  No abdominal wall hernia. Musculoskeletal: No acute displaced fractures. Postsurgical changes at L4-5. Reconstructed images demonstrate no additional findings. Review of the MIP images confirms the above findings. IMPRESSION: 1. No evidence of pulmonary embolus. 2. Cardiomegaly, with no evidence of pericardial effusion. 3. No acute  intrathoracic, intra-abdominal, or intrapelvic trauma. 4. Punctate bilateral nonobstructing renal calculi. 5.  Aortic Atherosclerosis (ICD10-I70.0). Electronically Signed   By: Randa Ngo M.D.   On: 08/16/2021 16:04   DG Foot Complete Left  Result Date: 08/16/2021 CLINICAL DATA:  Pain after fall. EXAM: LEFT TIBIA AND FIBULA - 2 VIEW; LEFT FOOT -  COMPLETE 3+ VIEW COMPARISON:  X-ray left tibia fibula and left femur 01/30/2019 FINDINGS: Right foot: No evidence of fracture, dislocation, or joint effusion. No evidence of severe arthropathy. No aggressive appearing focal bone abnormality. Subcutaneus soft tissue edema. Right tibia fibula: Acute laterally displaced fracture of the transsyndesmotic distal fibula. Acute laterally displaced medial malleolar fracture. No definite dislocation of the left ankle. Cortical irregularity of the intra-articular anterior tibia. No acute displaced fracture or dislocation of the proximal tibia fibula. The knee is grossly unremarkable. Total left knee arthroplasty. Associated subcutaneus soft tissue edema. IMPRESSION: 1. Acute laterally displaced fracture of the transsyndesmotic distal fibula. 2. Acute laterally displaced medial malleolar fracture. 3. Cortical irregularity of the intra-articular anterior tibia. Recommend dedicated left ankle radiograph. 4. No acute displaced fracture or dislocation of the proximal tibia fibula in the setting of a total left knee arthroplasty. 5. No acute displaced fracture or dislocation of the bones of the left foot. Electronically Signed   By: Iven Finn M.D.   On: 08/16/2021 15:11               LOS: 1 day   Morganna Styles  [facility]   Pager on [email or web address]. If 7PM-7AM, please contact night-coverage at [email or web address]     08/17/2021, 11:59 AM

## 2021-08-17 NOTE — Plan of Care (Signed)
  Problem: Clinical Measurements: Goal: Ability to maintain clinical measurements within normal limits will improve Outcome: Progressing   Problem: Clinical Measurements: Goal: Will remain free from infection Outcome: Progressing   Problem: Clinical Measurements: Goal: Diagnostic test results will improve Outcome: Progressing   

## 2021-08-17 NOTE — Plan of Care (Signed)
  Problem: Health Behavior/Discharge Planning: Goal: Ability to manage health-related needs will improve Outcome: Progressing   Problem: Clinical Measurements: Goal: Ability to maintain clinical measurements within normal limits will improve Outcome: Progressing Goal: Will remain free from infection Outcome: Progressing Goal: Diagnostic test results will improve Outcome: Progressing Goal: Respiratory complications will improve Outcome: Progressing Goal: Cardiovascular complication will be avoided Outcome: Progressing   Problem: Activity: Goal: Risk for activity intolerance will decrease Outcome: Progressing   Problem: Coping: Goal: Level of anxiety will decrease Outcome: Progressing   Problem: Elimination: Goal: Will not experience complications related to bowel motility Outcome: Progressing Goal: Will not experience complications related to urinary retention Outcome: Progressing   Problem: Pain Managment: Goal: General experience of comfort will improve Outcome: Progressing   Problem: Skin Integrity: Goal: Risk for impaired skin integrity will decrease Outcome: Progressing   Problem: Education: Goal: Ability to identify signs and symptoms of gastrointestinal bleeding will improve Outcome: Progressing   Problem: Bowel/Gastric: Goal: Will show no signs and symptoms of gastrointestinal bleeding Outcome: Progressing   Problem: Fluid Volume: Goal: Will show no signs and symptoms of excessive bleeding Outcome: Progressing   Problem: Clinical Measurements: Goal: Complications related to the disease process, condition or treatment will be avoided or minimized Outcome: Progressing

## 2021-08-17 NOTE — Consult Note (Signed)
Consultation  Referring Provider:     Dr Arville CareMansy Admit date 08/16/21 Consult date        08/17/21 Reason for Consultation: symptomatic anemia, heme + stool             HPI:   [name] is a 80 y.o. male with history of afib on eliquis, htn, migraines,subarachnoid hemorrhage 2010, LLE DVT 2011,  chronic back pain, HL who was admitted yesterday after a syncopal episode causing a fall and left ankle fracture. Appreciate orthopedic notes and surgery planned for Friday. He was found to have admission hemoglobin of 7.3 with heme + stool, has received 2u prbcs this morning.  He was also noted to have some bradycardia at arrival, but not he is not on anti arrythmics- is not taking the amlodipine on his list -see he had bradycardia in relation to verapamil noted by his [facility] (Dr Kirke CorinArida) cardiologist- last seen 02/10/21, hr was in 70s at that time. Note normal troponins x 2 this visit.Marland Kitchen. ekg with afib, rate 60s in ED.  states when he had the bradycardia in past it was associated with fatigue which improved once the verapamil/amlodipine were stopped.   Says he had some of this same fatigue yesterday prior to his syncopal episode. He is not having any fatigue currently and feels better since the transfusion. He denies any chest pain/sob. Last eliquis yesterday. States he has been doing well in his abdomen. Denies any problems with abdominal pain, melena/hematochezia, bowel habit changes, NVD, dyspepsia/dysphagia and all other GI concerns. There has been no unplanned weight loss. Denies any family history of colorectal cancer/colon polyps. There might have been an uncle with stomach cancer but this is not for certain. Had CT A/P yesterday with no acute of significant abnormalities to the abdomen/pelvis. Last hgb 7.6.  gfr 58, pt/inr 17/1.4, liver enzymes normal.  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 [place]   Intracranial bleed [facility](HCC)    december 2010   Left leg DVT (HCC) 09/2019   Migraines    "2 in my life" (09/17/2012)   Mitral regurgitation    a. 01/2019 Echo: EF 55-60%, mild conc LVH. RVSP 59.975mmHg. Mildly dil LA. Mild to mod MR. Mild AI.   PAH (pulmonary artery hypertension) (HCC)    Permanent atrial fibrillation (HCC)    a. CHA2DS2VASc = 5-->No OAC 2/2 h/o SAH; b. 03/2019 Zio: permanent Afib - 78 bpm (46-135). Occas PVCs (1.4% burden).   Retina disorder    hx of torn retina   Stroke (HCC) 2002   LLE "just a little bit weaker" (09/17/2012)   Subarachnoid hemorrhage (HCC) 08/2009    Past Surgical History:  Procedure Laterality Date   BLEPHAROPLASTY  1990's   "right eye" (09/16/2012)  CARDIOVASCULAR STRESS TEST     nl perfusion w/o ischemia, EF 56%, bradycardia without syncope 04/15/10 (Dr. Bartholome Bill)   CATARACT EXTRACTION W/ INTRAOCULAR LENS IMPLANT  1980's   "right" (09/17/2012)   COLONOSCOPY     06/26/09   EYE MUSCLE SURGERY  1980's   "right eye" (09/17/2012)   INGUINAL HERNIA REPAIR  04/10/2011   "right" (09/17/2012)   JOINT REPLACEMENT     PERIPHERAL VASCULAR THROMBECTOMY Left 10/27/2019   Procedure: PERIPHERAL VASCULAR THROMBECTOMY;  Surgeon: Algernon Huxley, MD;  Location: [place] [facility];  Service: Cardiovascular;  Laterality: Left;   POSTERIOR LUMBAR FUSION  09/16/2012   "L4-5" (09/17/2012)   RETINAL DETACHMENT SURGERY  1980's   "right" (09/17/2012)   TOTAL KNEE  ARTHROPLASTY  04/28/2010   "left" (09/17/2012)    Family History  Problem Relation Age of Onset   Heart disease Mother      Social History   Tobacco Use   Smoking status: Never   Smokeless tobacco: Never  Vaping Use   Vaping Use: Never used  Substance Use Topics   Alcohol use: No   Drug use: No    Prior to Admission medications   Medication Sig Start Date End Date Taking? Authorizing Provider  aliskiren (TEKTURNA) 300 MG tablet Take 300 mg by mouth daily.    Yes [provider]  amoxicillin (AMOXIL) 500 MG tablet Take 500 mg by mouth as directed. Dental procedure only   Yes [provider]  carbamazepine (TEGRETOL) 200 MG tablet Take 200 mg by mouth 2 (two) times a day.    Yes [provider]  doxazosin (CARDURA) 1 MG tablet Take 1 mg by mouth at bedtime.    Yes [provider]  ELIQUIS 5 MG TABS tablet Take 5 mg by mouth 2 (two) times daily. 10/06/19  Yes [provider]  furosemide (LASIX) 20 MG tablet Take 2 tablets (40 mg total) by mouth daily. 05/27/19  Yes Theora Gianotti, NP  irbesartan (AVAPRO) 300 MG tablet Take 300 mg by mouth daily. 11/19/17  Yes [provider]  Omega-3 Fatty Acids (FISH OIL) 1200 MG CAPS Take 1,200 mg by mouth daily.   Yes [provider]  pantoprazole (PROTONIX) 40 MG tablet Take 40 mg by mouth at bedtime.    Yes [provider]  simvastatin (ZOCOR) 20 MG tablet Take 20 mg by mouth 2 (two) times daily.    Yes [provider]  vitamin B-12 (CYANOCOBALAMIN) 1000 MCG tablet Take 1,000 mcg by mouth daily.   Yes [provider]  amLODipine (NORVASC) 2.5 MG tablet Take 2.5 mg by mouth daily. Patient not taking: Reported on 08/16/2021 02/19/21   [provider]    Current Facility-Administered Medications  Medication Dose Route Frequency Provider Last Rate Last Admin   0.9 %  sodium chloride infusion   Intravenous Continuous Mansy, Jan A, MD 100 mL/hr  at 08/17/21 0814 New Bag at 08/17/21 0814   acetaminophen (TYLENOL) tablet 650 mg  650 mg Oral Q6H PRN Mansy, Jan A, MD       Or   acetaminophen (TYLENOL) suppository 650 mg  650 mg Rectal Q6H PRN Mansy, Jan A, MD       aliskiren [facility]) tablet 300 mg  300 mg Oral Daily Mansy, Jan A, MD   300 mg at 08/17/21 0820   carbamazepine (TEGRETOL) tablet 200 mg  200 mg Oral BID Mansy, Jan A, MD   200 mg at 08/17/21 [phone]  doxazosin (CARDURA) tablet 1 mg  1 mg Oral QHS Mansy, Jan A, MD       irbesartan (AVAPRO) tablet 300 mg  300 mg Oral Daily Mansy, Jan A, MD   300 mg at 08/17/21 0819   magnesium hydroxide (MILK OF MAGNESIA) suspension 30 mL  30 mL Oral Daily PRN Mansy, Jan A, MD       omega-3 acid ethyl esters (LOVAZA) capsule 1 g  1 g Oral Daily Mansy, Jan A, MD       ondansetron [facility]) tablet 4 mg  4 mg Oral Q6H PRN Mansy, Jan A, MD       Or   ondansetron [facility]) injection 4 mg  4 mg Intravenous Q6H PRN Mansy, Arvella Merles, MD       [START ON 08/20/2021] pantoprazole (PROTONIX) injection 40 mg  40 mg Intravenous Q12H Mansy, Jan A, MD       pantoprozole (PROTONIX) 80 mg /NS 100 mL infusion  8 mg/hr Intravenous Continuous Mansy, Jan A, MD 10 mL/hr at 08/17/21 0817 8 mg/hr at 08/17/21 0817   promethazine (PHENERGAN) 12.5 mg in sodium chloride 0.9 % 50 mL IVPB  12.5 mg Intravenous Q6H PRN Teodoro Spray, PA   Held at 08/16/21 1903   simvastatin (ZOCOR) tablet 20 mg  20 mg Oral BID Mansy, Jan A, MD   20 mg at 08/17/21 [id number]   traZODone (DESYREL) tablet 25 mg  25 mg Oral QHS PRN Mansy, Jan A, MD       vitamin B-12 (CYANOCOBALAMIN) tablet 1,000 mcg  1,000 mcg Oral Daily Mansy, Jan A, MD        Allergies as of 08/16/2021 - Review Complete 08/16/2021  Allergen Reaction Noted   Codeine Nausea Only and Other (See Comments) 09/09/2012   Fentanyl Other (See Comments) 09/09/2012   Regadenoson Other (See Comments) 09/09/2012   Verapamil Other (See Comments) 10/27/2019     Review of Systems:    All  systems reviewed and negative except where noted in HPI, with the exception of unsteady gait he attributes to his peripheral neuropathy    Physical Exam:  Vital signs in last 24 hours: Temp:  [97.4 F (36.3 C)-98.5 F (36.9 C)] 98.5 F (36.9 C) (11/23 0745) Pulse Rate:  [55-90] 90 (11/23 0745) Resp:  [15-24] 15 (11/23 0745) BP: (105-165)/(51-88) 150/87 (11/23 0745) SpO2:  [94 %-100 %] 96 % (11/23 0745) Weight:  [101 kg-103.4 kg] 103.4 kg (11/23 0300) Last BM Date: 08/17/21 General:   Pleasant man in NAD Head:  Normocephalic and atraumatic. Eyes:   No icterus.   Conjunctiva pink. Ears:  Normal auditory acuity. Mouth: Mucosa pink moist, no lesions. Neck:  Supple; no masses felt Lungs:  Respirations even and unlabored. Lungs clear to auscultation bilaterally.   No wheezes, crackles, or rhonchi.  Heart:  S1S2, RRR, no MRG. No edema. Abdomen:   Flat, soft, nondistended, nontender. Normal bowel sounds. No appreciable masses or hepatomegaly. No rebound signs or other peritoneal signs. Rectal:  Not performed.  Msk:  MAEW x4, No clubbing or cyanosis. Strength 5/5. Symmetrical without gross deformities. Neurologic:  Alert and  oriented x4;  Cranial nerves II-XII intact.  Skin:  Warm, dry, pink without significant lesions or rashes. Psych:  Alert and cooperative. Normal affect.  LAB RESULTS: Recent Labs    08/16/21 1411 08/16/21 2206 08/17/21 0420  WBC 7.4  --  10.1  HGB 7.3* 7.5* 7.6*  HCT 25.3* 25.6* 25.5*  PLT 182  --  181  BMET Recent Labs    08/16/21 1411 08/17/21 0420  NA 136 137  K 3.9 4.7  CL 105 106  CO2 26 27  GLUCOSE 120* 86  BUN 23 19  CREATININE 1.25* 0.99  CALCIUM 8.8* 8.7*   LFT Recent Labs    08/16/21 1411  PROT 6.7  ALBUMIN 3.8  AST 16  ALT 9  ALKPHOS 59  BILITOT 0.6  BILIDIR <0.1  IBILI NOT CALCULATED   PT/INR Recent Labs    08/16/21 1411  LABPROT 17.0*  INR 1.4*    STUDIES: DG Chest 2 View  Result Date: 08/16/2021 CLINICAL DATA:   80 year old male with a history of syncope EXAM: CHEST - 2 VIEW COMPARISON:  02/17/2019 FINDINGS: The cardiac diameter appears enlarged compared to the prior, with more globular configuration and straightening of the left heart border. Stigmata of emphysema, with increased retrosternal airspace, flattened hemidiaphragms, increased AP diameter, and hyperinflation on the AP view. Hazy opacity at the bilateral lung bases with partial obscuration of the hemidiaphragms and the heart borders. Coarsened interstitial markings bilaterally. No pneumothorax. Degenerative changes of the spine.  No acute displaced fracture IMPRESSION: Increased diameter of the heart with a configuration suspicious for pericardial effusion. Correlation with either chest CT or cardiac echo may be considered. Opacities at the bilateral lung bases, likely a combination of pleural effusion and associated atelectasis/consolidation. Coarsened interstitial markings, potentially early pulmonary edema Electronically Signed   By: Corrie Mckusick D.O.   On: 08/16/2021 15:09   DG Tibia/Fibula Left  Result Date: 08/16/2021 CLINICAL DATA:  Pain after fall. EXAM: LEFT TIBIA AND FIBULA - 2 VIEW; LEFT FOOT - COMPLETE 3+ VIEW COMPARISON:  X-ray left tibia fibula and left femur 01/30/2019 FINDINGS: Right foot: No evidence of fracture, dislocation, or joint effusion. No evidence of severe arthropathy. No aggressive appearing focal bone abnormality. Subcutaneus soft tissue edema. Right tibia fibula: Acute laterally displaced fracture of the transsyndesmotic distal fibula. Acute laterally displaced medial malleolar fracture. No definite dislocation of the left ankle. Cortical irregularity of the intra-articular anterior tibia. No acute displaced fracture or dislocation of the proximal tibia fibula. The knee is grossly unremarkable. Total left knee arthroplasty. Associated subcutaneus soft tissue edema. IMPRESSION: 1. Acute laterally displaced fracture of the  transsyndesmotic distal fibula. 2. Acute laterally displaced medial malleolar fracture. 3. Cortical irregularity of the intra-articular anterior tibia. Recommend dedicated left ankle radiograph. 4. No acute displaced fracture or dislocation of the proximal tibia fibula in the setting of a total left knee arthroplasty. 5. No acute displaced fracture or dislocation of the bones of the left foot. Electronically Signed   By: Iven Finn M.D.   On: 08/16/2021 15:11   DG Ankle Complete Left  Result Date: 08/16/2021 CLINICAL DATA:  Radiologist recommended dedicated ankle film to further evaluate injury EXAM: LEFT ANKLE COMPLETE - 3+ VIEW COMPARISON:  Same day radiographs. FINDINGS: Redemonstrated acute laterally displaced oblique fracture of the distal fibula involving the syndesmosis. Redemonstrated acute laterally displaced medial malleolar fracture. Similar ill-defined cortical regularity of the intra-articular anterior tibia. Possible small joint effusion. Vascular calcifications. Calcaneal enthesophytes. IMPRESSION: 1. Redemonstrated acute laterally displaced oblique fracture of the distal fibula involving the syndesmosis. 2. Redemonstrated acute laterally displaced medial malleolar fracture. 3. Similar ill-defined cortical regularity of the intra-articular anterior tibia, possibly representing fracture given trauma and small effusion. CT of the ankle could confirm and better evaluate if clinically indicated. Electronically Signed   By: Margaretha Sheffield M.D.   On: 08/16/2021 16:20  CT Head Wo Contrast  Result Date: 08/16/2021 CLINICAL DATA:  Fall, head trauma EXAM: CT HEAD WITHOUT CONTRAST TECHNIQUE: Contiguous axial images were obtained from the base of the skull through the vertex without intravenous contrast. COMPARISON:  CT head 08/06/2019 FINDINGS: Brain: No acute intracranial hemorrhage, mass effect, or herniation. No extra-axial fluid collections. No evidence of acute territorial infarct. No  hydrocephalus. Mild-to-moderate cortical volume loss. Patchy hypodensities in the periventricular and subcortical white matter, likely secondary to chronic microvascular ischemic changes. Vascular: Calcified plaques in the carotid siphons. Skull: Normal. Negative for fracture or focal lesion. Sinuses/Orbits: No acute finding. Other: None. IMPRESSION: Chronic changes with no acute intracranial process identified. Electronically Signed   By: Ofilia Neas M.D.   On: 08/16/2021 14:41   CT Angio Chest PE W and/or Wo Contrast  Result Date: 08/16/2021 CLINICAL DATA:  Witnessed fall, dizziness, anemia EXAM: CT ANGIOGRAPHY CHEST CT ABDOMEN AND PELVIS WITH CONTRAST TECHNIQUE: Multidetector CT imaging of the chest was performed using the standard protocol during bolus administration of intravenous contrast. Multiplanar CT image reconstructions and MIPs were obtained to evaluate the vascular anatomy. Multidetector CT imaging of the abdomen and pelvis was performed using the standard protocol during bolus administration of intravenous contrast. CONTRAST:  142mL OMNIPAQUE IOHEXOL 350 MG/ML SOLN COMPARISON:  08/16/2021 FINDINGS: CTA CHEST FINDINGS Cardiovascular: This is a technically adequate evaluation of the pulmonary vasculature. No filling defects or pulmonary emboli. The heart is enlarged, with prominent biatrial dilation. No pericardial effusion. Extensive atherosclerosis of the coronary vasculature. No evidence of thoracic aortic aneurysm or dissection. Mild aortic atherosclerosis. Mediastinum/Nodes: No enlarged mediastinal, hilar, or axillary lymph nodes. Thyroid gland, trachea, and esophagus demonstrate no significant findings. Lungs/Pleura: No airspace disease, effusion, or pneumothorax. Central airways are patent. Musculoskeletal: No acute displaced fractures. Reconstructed images demonstrate no additional findings. Review of the MIP images confirms the above findings. CT ABDOMEN and PELVIS FINDINGS  Hepatobiliary: No focal liver abnormality is seen. No gallstones, gallbladder wall thickening, or biliary dilatation. Pancreas: Unremarkable. No pancreatic ductal dilatation or surrounding inflammatory changes. Spleen: Normal in size without focal abnormality. 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 Mild nonspecific thickening of the left adrenal gland measuring up to 1.4 cm. 4 mm nonobstructing calculus mid right kidney. 2 mm nonobstructing calculus lower pole left kidney. There is mild bilateral renal cortical thinning. No obstructive uropathy. The bladder is unremarkable. Stomach/Bowel: No bowel obstruction or ileus. Normal appendix right lower quadrant. No bowel wall thickening or inflammatory change. Vascular/Lymphatic: Aortic atherosclerosis. No enlarged abdominal or pelvic lymph nodes. Reproductive: Prostate is unremarkable. Other: No free fluid or free gas.  No abdominal wall hernia. Musculoskeletal: No acute displaced fractures. Postsurgical changes at L4-5. Reconstructed images demonstrate no additional findings. Review of the MIP images confirms the above findings. IMPRESSION: 1. No evidence of pulmonary embolus. 2. Cardiomegaly, with no evidence of pericardial effusion. 3. No acute intrathoracic, intra-abdominal, or intrapelvic trauma. 4. Punctate bilateral nonobstructing renal calculi. 5.  Aortic Atherosclerosis (ICD10-I70.0). Electronically Signed   By: Randa Ngo M.D.   On: 08/16/2021 16:04   CT Cervical Spine Wo Contrast  Result Date: 08/16/2021 CLINICAL DATA:  Neck pain EXAM: CT CERVICAL SPINE WITHOUT CONTRAST TECHNIQUE: Multidetector CT imaging of the cervical spine was performed without intravenous contrast. Multiplanar CT image reconstructions were also generated. COMPARISON:  MRI cervical spine 08/18/2014 FINDINGS: Alignment: Grade 1 anterolisthesis of C3 on C4 and C4 on C5. Skull base and vertebrae: No acute fracture. No primary bone lesion or focal pathologic  process. Soft tissues and spinal canal: No prevertebral fluid or swelling. No visible canal hematoma. Disc levels: Moderate intervertebral disc height loss at C5-C6 and mild-to-moderate at C6-C7. Dorsal endplate osteophytes and uncovertebral spurring most prominent at C5-C6. Facet arthropathy which is worse on the right. Multilevel neural foraminal narrowing most significant at C5-C6. Upper chest: No acute process identified. Other: None. IMPRESSION: Chronic degenerative changes with no acute fracture or subluxation identified. Electronically Signed   By: Ofilia Neas M.D.   On: 08/16/2021 14:44   CT Abdomen Pelvis W Contrast  Result Date: 08/16/2021 CLINICAL DATA:  Witnessed fall, dizziness, anemia EXAM: CT ANGIOGRAPHY CHEST CT ABDOMEN AND PELVIS WITH CONTRAST TECHNIQUE: Multidetector CT imaging of the chest was performed using the standard protocol during bolus administration of intravenous contrast. Multiplanar CT image reconstructions and MIPs were obtained to evaluate the vascular anatomy. Multidetector CT imaging of the abdomen and pelvis was performed using the standard protocol during bolus administration of intravenous contrast. CONTRAST:  163mL OMNIPAQUE IOHEXOL 350 MG/ML SOLN COMPARISON:  08/16/2021 FINDINGS: CTA CHEST FINDINGS Cardiovascular: This is a technically adequate evaluation of the pulmonary vasculature. No filling defects or pulmonary emboli. The heart is enlarged, with prominent biatrial dilation. No pericardial effusion. Extensive atherosclerosis of the coronary vasculature. No evidence of thoracic aortic aneurysm or dissection. Mild aortic atherosclerosis. Mediastinum/Nodes: No enlarged mediastinal, hilar, or axillary lymph nodes. Thyroid gland, trachea, and esophagus demonstrate no significant findings. Lungs/Pleura: No airspace disease, effusion, or pneumothorax. Central airways are patent. Musculoskeletal: No acute displaced fractures. Reconstructed images demonstrate no  additional findings. Review of the MIP images confirms the above findings. CT ABDOMEN and PELVIS FINDINGS Hepatobiliary: No focal liver abnormality is seen. No gallstones, gallbladder wall thickening, or biliary dilatation. Pancreas: Unremarkable. No pancreatic ductal dilatation or surrounding inflammatory changes. Spleen: Normal in size without focal abnormality. 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 Mild nonspecific thickening of the left adrenal gland measuring up to 1.4 cm. 4 mm nonobstructing calculus mid right kidney. 2 mm nonobstructing calculus lower pole left kidney. There is mild bilateral renal cortical thinning. No obstructive uropathy. The bladder is unremarkable. Stomach/Bowel: No bowel obstruction or ileus. Normal appendix right lower quadrant. No bowel wall thickening or inflammatory change. Vascular/Lymphatic: Aortic atherosclerosis. No enlarged abdominal or pelvic lymph nodes. Reproductive: Prostate is unremarkable. Other: No free fluid or free gas.  No abdominal wall hernia. Musculoskeletal: No acute displaced fractures. Postsurgical changes at L4-5. Reconstructed images demonstrate no additional findings. Review of the MIP images confirms the above findings. IMPRESSION: 1. No evidence of pulmonary embolus. 2. Cardiomegaly, with no evidence of pericardial effusion. 3. No acute intrathoracic, intra-abdominal, or intrapelvic trauma. 4. Punctate bilateral nonobstructing renal calculi. 5.  Aortic Atherosclerosis (ICD10-I70.0). Electronically Signed   By: Randa Ngo M.D.   On: 08/16/2021 16:04   DG Foot Complete Left  Result Date: 08/16/2021 CLINICAL DATA:  Pain after fall. EXAM: LEFT TIBIA AND FIBULA - 2 VIEW; LEFT FOOT - COMPLETE 3+ VIEW COMPARISON:  X-ray left tibia fibula and left femur 01/30/2019 FINDINGS: Right foot: No evidence of fracture, dislocation, or joint effusion. No evidence of severe arthropathy. No aggressive appearing focal bone abnormality. Subcutaneus  soft tissue edema. Right tibia fibula: Acute laterally displaced fracture of the transsyndesmotic distal fibula. Acute laterally displaced medial malleolar fracture. No definite dislocation of the left ankle. Cortical irregularity of the intra-articular anterior tibia. No acute displaced fracture or dislocation of the proximal tibia fibula. The knee is grossly unremarkable. Total left knee arthroplasty. Associated subcutaneus  soft tissue edema. IMPRESSION: 1. Acute laterally displaced fracture of the transsyndesmotic distal fibula. 2. Acute laterally displaced medial malleolar fracture. 3. Cortical irregularity of the intra-articular anterior tibia. Recommend dedicated left ankle radiograph. 4. No acute displaced fracture or dislocation of the proximal tibia fibula in the setting of a total left knee arthroplasty. 5. No acute displaced fracture or dislocation of the bones of the left foot. Electronically Signed   By: Tish Frederickson M.D.   On: 08/16/2021 15:11       Impression / Plan:   Symptomatic anemia, heme + stool- do recommend endoscopies for luminal evaluation, however will need to time this with his other needs- additionally given what sounds like an episode of symptomatic bradycardia would recommend follow up with his cardiologist- may need repeat holter- says his last was many years ago. He is currently on tele. Agree with transfusions and following hgb, would recommend changing the pantoprazole to po bid for now in the absence of melena/pain/dyspepsia/dysphagia.  Thank you very much for this consult. These services were provided by Vevelyn Pat, NP-C, in collaboration with Isidore Moos, MD, with whom I have discussed this patient in full.   Vevelyn Pat, NP-C

## 2021-08-17 NOTE — TOC Initial Note (Addendum)
Transition of Care [facility]) - Initial/Assessment Note    Patient Details  Name: [name] MRN: [id number] Date of Birth: Oct 25, 1940  Transition of Care [facility]) CM/SW Contact:    Marlowe Sax, RN Phone Number: 08/17/2021, 9:16 AM  Clinical Narrative:       The patient lives at home with his wife.  He was found to have a + Hemacult in the ED and was given Transfusion, He injured his ankle. He has a pending GO consult and a Pending Ortho Consult. Plans to DC back home with his wife.  TOC to continue to follow for DC planning and needs           update  Plan for surgical intervention on 08/19/2021     Patient Goals and CMS Choice        Expected Discharge Plan and Services                                                Prior Living Arrangements/Services                       Activities of Daily Living Home Assistive Devices/Equipment: Dan Humphreys (specify type) ADL Screening (condition at time of admission) Patient's cognitive ability adequate to safely complete daily activities?: Yes Is the patient deaf or have difficulty hearing?: No Does the patient have difficulty seeing, even when wearing glasses/contacts?: No Does the patient have difficulty concentrating, remembering, or making decisions?: No Patient able to express need for assistance with ADLs?: No Does the patient have difficulty dressing or bathing?: No Independently performs ADLs?: Yes (appropriate for developmental age) Does the patient have difficulty walking or climbing stairs?: No Weakness of Legs: Both Weakness of Arms/Hands: None  Permission Sought/Granted                  Emotional Assessment              Admission diagnosis:  GI bleeding [K92.2] Patient Active Problem List   Diagnosis Date Noted   GI bleeding 08/16/2021   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/10/2020   History of cerebral hemorrhage 10/27/2019   Arthritis 10/21/2019   Hyperlipidemia  10/21/2019   Hypertension 10/21/2019   Seizures (HCC) 10/21/2019   Stroke (HCC) 10/21/2019   DVT (deep venous thrombosis) (HCC) 10/21/2019   Atrial fibrillation (HCC) 02/17/2019   Bilateral swelling of feet 01/23/2009   Swelling of both ankles 09/25/2001   PCP:  Jerl Mina, MD Pharmacy:   [id number] [place], [place] - [address] AT [facility] [address] [place] [address] Phone: [phone] Fax: [phone]     Social Determinants of Health (SDOH) Interventions    Readmission Risk Interventions No flowsheet data found.

## 2021-08-18 LAB — BPAM RBC
Blood Product Expiration Date: 202212272359
Blood Product Expiration Date: 202212272359
ISSUE DATE / TIME: 202211230030
ISSUE DATE / TIME: 202211230433
Unit Type and Rh: 5100
Unit Type and Rh: 5100

## 2021-08-18 LAB — TYPE AND SCREEN
ABO/RH(D): O POS
Antibody Screen: NEGATIVE
Unit division: 0
Unit division: 0

## 2021-08-18 LAB — SURGICAL PCR SCREEN
MRSA, PCR: NEGATIVE
Staphylococcus aureus: POSITIVE — AB

## 2021-08-18 MED ORDER — MUPIROCIN 2 % EX OINT
1.0000 "application " | TOPICAL_OINTMENT | Freq: Two times a day (BID) | CUTANEOUS | Status: AC
  Administered 2021-08-18 – 2021-08-22 (×10): 1 via NASAL
  Filled 2021-08-18: qty 22

## 2021-08-18 NOTE — Progress Notes (Signed)
Progress Note    [name]  XMI:[id number] DOB: Jan 01, 1941  DOA: 08/16/2021 PCP: Jerl Mina, MD      Brief Narrative:    Medical records reviewed and are as summarized below:  [name] is a 80 y.o. male with medical history significant for left lower extremity DVT in 2011, subarachnoid hemorrhage in 2010, hypertension, dyslipidemia, migraine, chronic atrial fibrillation, chronic low back pain, who presented to the hospital because of syncope leading to a fall and left ankle injury.      Assessment/Plan:   Principal Problem:   GI bleeding   Body mass index is 30.92 kg/m.  (Obesity)  S/p syncope: Probably from anemia.  Acute blood loss anemia: H&H is stable.  S/p transfusion with 2 units of PRBCs.  Eliquis has been held.  Positive heme stool: No melena or hematemesis.  Advance diet from clear liquid to regular diet.  Keep n.p.o. after midnight.  Continue Protonix.    Left ankle fracture: Continue analgesics as needed for pain.  Plan for left ankle surgery tomorrow.  Follow-up with orthopedic surgeon.    Other comorbidities include hypertension, dyslipidemia, vitamin B12 deficiency, atrial fibrillation on Eliquis    Diet Order             Diet NPO time specified Except for: Sips with Meds  Diet effective midnight           Diet Heart Room service appropriate? Yes; Fluid consistency: Thin  Diet effective now                      Consultants: Gastroenterologist Orthopedic surgeon  Procedures: None    Medications:    aliskiren  300 mg Oral Daily   carbamazepine  200 mg Oral BID   doxazosin  1 mg Oral QHS   irbesartan  300 mg Oral Daily   omega-3 acid ethyl esters  1 g Oral Daily   [START ON 08/20/2021] pantoprazole  40 mg Intravenous Q12H   simvastatin  20 mg Oral BID   vitamin B-12  1,000 mcg Oral Daily   Continuous Infusions:  sodium chloride 100 mL/hr at 08/18/21 0554   pantoprazole 8 mg/hr (08/18/21 0215)    promethazine (PHENERGAN) injection (IM or IVPB) Stopped (08/16/21 1903)     Anti-infectives (From admission, onward)    None              Family Communication/Anticipated D/C date and plan/Code Status   DVT prophylaxis: Place and maintain sequential compression device Start: 08/16/21 2032 SCDs Start: 08/16/21 2010     Code Status: Full Code  Family Communication: Plan discussed with Peyton Najjar, son, at the bedside Disposition Plan: Discharge to home versus SNF in 3 to 4 days   Status is: Inpatient  Remains inpatient appropriate because: Left ankle fracture awaiting surgery           Subjective:   No rectal bleeding or hematemesis.  Pain in the left foot is tolerable.  Peyton Najjar, his oldest son, was at the bedside  Objective:    Vitals:   08/17/21 2202 08/17/21 2229 08/18/21 0315 08/18/21 0752  BP: (!) 154/85 (!) 143/62 (!) 157/74 (!) 160/80  Pulse: 78 68 87 76  Resp:  17 17 16   Temp: 97.8 F (36.6 C) 99.2 F (37.3 C) 98.6 F (37 C) 98.3 F (36.8 C)  TempSrc: Oral     SpO2: 97% 97% 95% 95%  Weight:      Height:  No data found.   Intake/Output Summary (Last 24 hours) at 08/18/2021 1034 Last data filed at 08/18/2021 [id number] Gross per 24 hour  Intake 1190.15 ml  Output 1220 ml  Net -29.85 ml   Filed Weights   08/16/21 1319 08/17/21 0300  Weight: 101 kg 103.4 kg    Exam:  GEN: No acute distress SKIN: Warm and dry EYES: No pallor or anicteric ENT: MMM CV: RRR PULM: CTA B ABD: soft, ND, NT, +BS CNS: AAO x 3, non focal EXT: Cast on her left lower extremity         Data Reviewed:   I have personally reviewed following labs and imaging studies:  Labs: Labs show the following:   Basic Metabolic Panel: Recent Labs  Lab 08/16/21 1411 08/17/21 0420  NA 136 137  K 3.9 4.7  CL 105 106  CO2 26 27  GLUCOSE 120* 86  BUN 23 19  CREATININE 1.25* 0.99  CALCIUM 8.8* 8.7*   GFR Estimated Creatinine Clearance: 72.8 mL/min (by C-G  formula based on SCr of 0.99 mg/dL). Liver Function Tests: Recent Labs  Lab 08/16/21 1411  AST 16  ALT 9  ALKPHOS 59  BILITOT 0.6  PROT 6.7  ALBUMIN 3.8   No results for input(s): LIPASE, AMYLASE in the last 168 hours. No results for input(s): AMMONIA in the last 168 hours. Coagulation profile Recent Labs  Lab 08/16/21 1411  INR 1.4*    CBC: Recent Labs  Lab 08/16/21 1411 08/16/21 2206 08/17/21 0420 08/17/21 0947 08/17/21 1434 08/17/21 2120  WBC 7.4  --  10.1  --   --   --   NEUTROABS 6.2  --   --   --   --   --   HGB 7.3* 7.5* 7.6* 8.5* 8.8* 8.3*  HCT 25.3* 25.6* 25.5* 27.5* 29.2* 27.5*  MCV 75.7*  --  75.2*  --   --   --   PLT 182  --  181  --   --   --    Cardiac Enzymes: No results for input(s): CKTOTAL, CKMB, CKMBINDEX, TROPONINI in the last 168 hours. BNP (last 3 results) No results for input(s): PROBNP in the last 8760 hours. CBG: Recent Labs  Lab 08/16/21 1405  GLUCAP 116*   D-Dimer: No results for input(s): DDIMER in the last 72 hours. Hgb A1c: No results for input(s): HGBA1C in the last 72 hours. Lipid Profile: No results for input(s): CHOL, HDL, LDLCALC, TRIG, CHOLHDL, LDLDIRECT in the last 72 hours. Thyroid function studies: No results for input(s): TSH, T4TOTAL, T3FREE, THYROIDAB in the last 72 hours.  Invalid input(s): FREET3 Anemia work up: No results for input(s): VITAMINB12, FOLATE, FERRITIN, TIBC, IRON, RETICCTPCT in the last 72 hours. Sepsis Labs: Recent Labs  Lab 08/16/21 1411 08/17/21 0420  WBC 7.4 10.1    Microbiology Recent Results (from the past 240 hour(s))  Resp Panel by RT-PCR (Flu A&B, Covid) Nasopharyngeal Swab     Status: None   Collection Time: 08/16/21  6:24 PM   Specimen: Nasopharyngeal Swab; Nasopharyngeal(NP) swabs in vial transport medium  Result Value Ref Range Status   SARS Coronavirus 2 by RT PCR NEGATIVE NEGATIVE Final    Comment: (NOTE) SARS-CoV-2 target nucleic acids are NOT DETECTED.  The  SARS-CoV-2 RNA is generally detectable in upper respiratory specimens during the acute phase of infection. The lowest concentration of SARS-CoV-2 viral copies this assay can detect is 138 copies/mL. A negative result does not preclude SARS-Cov-2 infection and should not  be used as the sole basis for treatment or other patient management decisions. A negative result may occur with  improper specimen collection/handling, submission of specimen other than nasopharyngeal swab, presence of viral mutation(s) within the areas targeted by this assay, and inadequate number of viral copies(<138 copies/mL). A negative result must be combined with clinical observations, patient history, and epidemiological information. The expected result is Negative.  Fact Sheet for Patients:  EntrepreneurPulse.com.au  Fact Sheet for Healthcare Providers:  IncredibleEmployment.be  This test is no t yet approved or cleared by the [place] FDA and  has been authorized for detection and/or diagnosis of SARS-CoV-2 by FDA under an Emergency Use Authorization (EUA). This EUA will remain  in effect (meaning this test can be used) for the duration of the COVID-19 declaration under Section 564(b)(1) of the Act, 21 U.S.C.section 360bbb-3(b)(1), unless the authorization is terminated  or revoked sooner.       Influenza A by PCR NEGATIVE NEGATIVE Final   Influenza B by PCR NEGATIVE NEGATIVE Final    Comment: (NOTE) The Xpert Xpress SARS-CoV-2/FLU/RSV plus assay is intended as an aid in the diagnosis of influenza from Nasopharyngeal swab specimens and should not be used as a sole basis for treatment. Nasal washings and aspirates are unacceptable for Xpert Xpress SARS-CoV-2/FLU/RSV testing.  Fact Sheet for Patients: EntrepreneurPulse.com.au  Fact Sheet for Healthcare Providers: IncredibleEmployment.be  This test is not yet approved or  cleared by the [place] FDA and has been authorized for detection and/or diagnosis of SARS-CoV-2 by FDA under an Emergency Use Authorization (EUA). This EUA will remain in effect (meaning this test can be used) for the duration of the COVID-19 declaration under Section 564(b)(1) of the Act, 21 U.S.C. section 360bbb-3(b)(1), unless the authorization is terminated or revoked.  Performed at [facility], [address] [place], [place] [address]     Procedures and diagnostic studies:  DG Chest 2 View  Result Date: 08/16/2021 CLINICAL DATA:  80 year old male with a history of syncope EXAM: CHEST - 2 VIEW COMPARISON:  02/17/2019 FINDINGS: The cardiac diameter appears enlarged compared to the prior, with more globular configuration and straightening of the left heart border. Stigmata of emphysema, with increased retrosternal airspace, flattened hemidiaphragms, increased AP diameter, and hyperinflation on the AP view. Hazy opacity at the bilateral lung bases with partial obscuration of the hemidiaphragms and the heart borders. Coarsened interstitial markings bilaterally. No pneumothorax. Degenerative changes of the spine.  No acute displaced fracture IMPRESSION: Increased diameter of the heart with a configuration suspicious for pericardial effusion. Correlation with either chest CT or cardiac echo may be considered. Opacities at the bilateral lung bases, likely a combination of pleural effusion and associated atelectasis/consolidation. Coarsened interstitial markings, potentially early pulmonary edema Electronically Signed   By: Corrie Mckusick D.O.   On: 08/16/2021 15:09   DG Tibia/Fibula Left  Result Date: 08/16/2021 CLINICAL DATA:  Pain after fall. EXAM: LEFT TIBIA AND FIBULA - 2 VIEW; LEFT FOOT - COMPLETE 3+ VIEW COMPARISON:  X-ray left tibia fibula and left femur 01/30/2019 FINDINGS: Right foot: No evidence of fracture, dislocation, or joint effusion. No evidence of severe  arthropathy. No aggressive appearing focal bone abnormality. Subcutaneus soft tissue edema. Right tibia fibula: Acute laterally displaced fracture of the transsyndesmotic distal fibula. Acute laterally displaced medial malleolar fracture. No definite dislocation of the left ankle. Cortical irregularity of the intra-articular anterior tibia. No acute displaced fracture or dislocation of the proximal tibia fibula. The knee is grossly unremarkable. Total  left knee arthroplasty. Associated subcutaneus soft tissue edema. IMPRESSION: 1. Acute laterally displaced fracture of the transsyndesmotic distal fibula. 2. Acute laterally displaced medial malleolar fracture. 3. Cortical irregularity of the intra-articular anterior tibia. Recommend dedicated left ankle radiograph. 4. No acute displaced fracture or dislocation of the proximal tibia fibula in the setting of a total left knee arthroplasty. 5. No acute displaced fracture or dislocation of the bones of the left foot. Electronically Signed   By: Iven Finn M.D.   On: 08/16/2021 15:11   DG Ankle Complete Left  Result Date: 08/16/2021 CLINICAL DATA:  Radiologist recommended dedicated ankle film to further evaluate injury EXAM: LEFT ANKLE COMPLETE - 3+ VIEW COMPARISON:  Same day radiographs. FINDINGS: Redemonstrated acute laterally displaced oblique fracture of the distal fibula involving the syndesmosis. Redemonstrated acute laterally displaced medial malleolar fracture. Similar ill-defined cortical regularity of the intra-articular anterior tibia. Possible small joint effusion. Vascular calcifications. Calcaneal enthesophytes. IMPRESSION: 1. Redemonstrated acute laterally displaced oblique fracture of the distal fibula involving the syndesmosis. 2. Redemonstrated acute laterally displaced medial malleolar fracture. 3. Similar ill-defined cortical regularity of the intra-articular anterior tibia, possibly representing fracture given trauma and small effusion. CT of  the ankle could confirm and better evaluate if clinically indicated. Electronically Signed   By: Margaretha Sheffield M.D.   On: 08/16/2021 16:20   CT Head Wo Contrast  Result Date: 08/16/2021 CLINICAL DATA:  Fall, head trauma EXAM: CT HEAD WITHOUT CONTRAST TECHNIQUE: Contiguous axial images were obtained from the base of the skull through the vertex without intravenous contrast. COMPARISON:  CT head 08/06/2019 FINDINGS: Brain: No acute intracranial hemorrhage, mass effect, or herniation. No extra-axial fluid collections. No evidence of acute territorial infarct. No hydrocephalus. Mild-to-moderate cortical volume loss. Patchy hypodensities in the periventricular and subcortical white matter, likely secondary to chronic microvascular ischemic changes. Vascular: Calcified plaques in the carotid siphons. Skull: Normal. Negative for fracture or focal lesion. Sinuses/Orbits: No acute finding. Other: None. IMPRESSION: Chronic changes with no acute intracranial process identified. Electronically Signed   By: Ofilia Neas M.D.   On: 08/16/2021 14:41   CT Angio Chest PE W and/or Wo Contrast  Result Date: 08/16/2021 CLINICAL DATA:  Witnessed fall, dizziness, anemia EXAM: CT ANGIOGRAPHY CHEST CT ABDOMEN AND PELVIS WITH CONTRAST TECHNIQUE: Multidetector CT imaging of the chest was performed using the standard protocol during bolus administration of intravenous contrast. Multiplanar CT image reconstructions and MIPs were obtained to evaluate the vascular anatomy. Multidetector CT imaging of the abdomen and pelvis was performed using the standard protocol during bolus administration of intravenous contrast. CONTRAST:  166mL OMNIPAQUE IOHEXOL 350 MG/ML SOLN COMPARISON:  08/16/2021 FINDINGS: CTA CHEST FINDINGS Cardiovascular: This is a technically adequate evaluation of the pulmonary vasculature. No filling defects or pulmonary emboli. The heart is enlarged, with prominent biatrial dilation. No pericardial effusion.  Extensive atherosclerosis of the coronary vasculature. No evidence of thoracic aortic aneurysm or dissection. Mild aortic atherosclerosis. Mediastinum/Nodes: No enlarged mediastinal, hilar, or axillary lymph nodes. Thyroid gland, trachea, and esophagus demonstrate no significant findings. Lungs/Pleura: No airspace disease, effusion, or pneumothorax. Central airways are patent. Musculoskeletal: No acute displaced fractures. Reconstructed images demonstrate no additional findings. Review of the MIP images confirms the above findings. CT ABDOMEN and PELVIS FINDINGS Hepatobiliary: No focal liver abnormality is seen. No gallstones, gallbladder wall thickening, or biliary dilatation. Pancreas: Unremarkable. No pancreatic ductal dilatation or surrounding inflammatory changes. Spleen: Normal in size without focal abnormality. 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 Mild nonspecific thickening of the  left adrenal gland measuring up to 1.4 cm. 4 mm nonobstructing calculus mid right kidney. 2 mm nonobstructing calculus lower pole left kidney. There is mild bilateral renal cortical thinning. No obstructive uropathy. The bladder is unremarkable. Stomach/Bowel: No bowel obstruction or ileus. Normal appendix right lower quadrant. No bowel wall thickening or inflammatory change. Vascular/Lymphatic: Aortic atherosclerosis. No enlarged abdominal or pelvic lymph nodes. Reproductive: Prostate is unremarkable. Other: No free fluid or free gas.  No abdominal wall hernia. Musculoskeletal: No acute displaced fractures. Postsurgical changes at L4-5. Reconstructed images demonstrate no additional findings. Review of the MIP images confirms the above findings. IMPRESSION: 1. No evidence of pulmonary embolus. 2. Cardiomegaly, with no evidence of pericardial effusion. 3. No acute intrathoracic, intra-abdominal, or intrapelvic trauma. 4. Punctate bilateral nonobstructing renal calculi. 5.  Aortic Atherosclerosis (ICD10-I70.0).  Electronically Signed   By: Randa Ngo M.D.   On: 08/16/2021 16:04   CT Cervical Spine Wo Contrast  Result Date: 08/16/2021 CLINICAL DATA:  Neck pain EXAM: CT CERVICAL SPINE WITHOUT CONTRAST TECHNIQUE: Multidetector CT imaging of the cervical spine was performed without intravenous contrast. Multiplanar CT image reconstructions were also generated. COMPARISON:  MRI cervical spine 08/18/2014 FINDINGS: Alignment: Grade 1 anterolisthesis of C3 on C4 and C4 on C5. Skull base and vertebrae: No acute fracture. No primary bone lesion or focal pathologic process. Soft tissues and spinal canal: No prevertebral fluid or swelling. No visible canal hematoma. Disc levels: Moderate intervertebral disc height loss at C5-C6 and mild-to-moderate at C6-C7. Dorsal endplate osteophytes and uncovertebral spurring most prominent at C5-C6. Facet arthropathy which is worse on the right. Multilevel neural foraminal narrowing most significant at C5-C6. Upper chest: No acute process identified. Other: None. IMPRESSION: Chronic degenerative changes with no acute fracture or subluxation identified. Electronically Signed   By: Ofilia Neas M.D.   On: 08/16/2021 14:44   CT Abdomen Pelvis W Contrast  Result Date: 08/16/2021 CLINICAL DATA:  Witnessed fall, dizziness, anemia EXAM: CT ANGIOGRAPHY CHEST CT ABDOMEN AND PELVIS WITH CONTRAST TECHNIQUE: Multidetector CT imaging of the chest was performed using the standard protocol during bolus administration of intravenous contrast. Multiplanar CT image reconstructions and MIPs were obtained to evaluate the vascular anatomy. Multidetector CT imaging of the abdomen and pelvis was performed using the standard protocol during bolus administration of intravenous contrast. CONTRAST:  155mL OMNIPAQUE IOHEXOL 350 MG/ML SOLN COMPARISON:  08/16/2021 FINDINGS: CTA CHEST FINDINGS Cardiovascular: This is a technically adequate evaluation of the pulmonary vasculature. No filling defects or  pulmonary emboli. The heart is enlarged, with prominent biatrial dilation. No pericardial effusion. Extensive atherosclerosis of the coronary vasculature. No evidence of thoracic aortic aneurysm or dissection. Mild aortic atherosclerosis. Mediastinum/Nodes: No enlarged mediastinal, hilar, or axillary lymph nodes. Thyroid gland, trachea, and esophagus demonstrate no significant findings. Lungs/Pleura: No airspace disease, effusion, or pneumothorax. Central airways are patent. Musculoskeletal: No acute displaced fractures. Reconstructed images demonstrate no additional findings. Review of the MIP images confirms the above findings. CT ABDOMEN and PELVIS FINDINGS Hepatobiliary: No focal liver abnormality is seen. No gallstones, gallbladder wall thickening, or biliary dilatation. Pancreas: Unremarkable. No pancreatic ductal dilatation or surrounding inflammatory changes. Spleen: Normal in size without focal abnormality. 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 Mild nonspecific thickening of the left adrenal gland measuring up to 1.4 cm. 4 mm nonobstructing calculus mid right kidney. 2 mm nonobstructing calculus lower pole left kidney. There is mild bilateral renal cortical thinning. No obstructive uropathy. The bladder is unremarkable. Stomach/Bowel: No bowel obstruction or ileus. Normal appendix  right lower quadrant. No bowel wall thickening or inflammatory change. Vascular/Lymphatic: Aortic atherosclerosis. No enlarged abdominal or pelvic lymph nodes. Reproductive: Prostate is unremarkable. Other: No free fluid or free gas.  No abdominal wall hernia. Musculoskeletal: No acute displaced fractures. Postsurgical changes at L4-5. Reconstructed images demonstrate no additional findings. Review of the MIP images confirms the above findings. IMPRESSION: 1. No evidence of pulmonary embolus. 2. Cardiomegaly, with no evidence of pericardial effusion. 3. No acute intrathoracic, intra-abdominal, or intrapelvic  trauma. 4. Punctate bilateral nonobstructing renal calculi. 5.  Aortic Atherosclerosis (ICD10-I70.0). Electronically Signed   By: Randa Ngo M.D.   On: 08/16/2021 16:04   DG Foot Complete Left  Result Date: 08/16/2021 CLINICAL DATA:  Pain after fall. EXAM: LEFT TIBIA AND FIBULA - 2 VIEW; LEFT FOOT - COMPLETE 3+ VIEW COMPARISON:  X-ray left tibia fibula and left femur 01/30/2019 FINDINGS: Right foot: No evidence of fracture, dislocation, or joint effusion. No evidence of severe arthropathy. No aggressive appearing focal bone abnormality. Subcutaneus soft tissue edema. Right tibia fibula: Acute laterally displaced fracture of the transsyndesmotic distal fibula. Acute laterally displaced medial malleolar fracture. No definite dislocation of the left ankle. Cortical irregularity of the intra-articular anterior tibia. No acute displaced fracture or dislocation of the proximal tibia fibula. The knee is grossly unremarkable. Total left knee arthroplasty. Associated subcutaneus soft tissue edema. IMPRESSION: 1. Acute laterally displaced fracture of the transsyndesmotic distal fibula. 2. Acute laterally displaced medial malleolar fracture. 3. Cortical irregularity of the intra-articular anterior tibia. Recommend dedicated left ankle radiograph. 4. No acute displaced fracture or dislocation of the proximal tibia fibula in the setting of a total left knee arthroplasty. 5. No acute displaced fracture or dislocation of the bones of the left foot. Electronically Signed   By: Iven Finn M.D.   On: 08/16/2021 15:11               LOS: 2 days   Braxson Hollingsworth  [facility]   Pager on [email or web address]. If 7PM-7AM, please contact night-coverage at [email or web address]     08/18/2021, 10:34 AM

## 2021-08-18 NOTE — Progress Notes (Signed)
   08/18/21 1040  Clinical Encounter Type  Visited With Family  Visit Type Other (Comment)  Referral From Other (Comment)  Spiritual Encounters  Spiritual Needs Emotional;Other (Comment) (social support)  Chaplain Burris responded to a rapid response for Pt's spouse, [name]. [name] attended [name] as she was led from the medical arts area where she experienced a fall. Chaplain sat with [name] and son as she received some medical care. Chaplain Burris provided hospitality and general support as she recovered.

## 2021-08-18 NOTE — Anesthesia Preprocedure Evaluation (Addendum)
Anesthesia Evaluation  Patient identified by MRN, date of birth, ID band Patient awake    Reviewed: Allergy & Precautions, H&P , NPO status , Patient's Chart, lab work & pertinent test results  History of Anesthesia Complications (+) history of anesthetic complications (cardiac arrest with dye from lexiscan (04/15/10 records indicate bradycardia, no ischemia, EF 56%), unstable BP with anesthesia)  Airway Mallampati: III  TM Distance: >3 FB Neck ROM: Full    Dental no notable dental hx.    Pulmonary  PAH   Pulmonary exam normal        Cardiovascular Exercise Tolerance: Good hypertension, Pt. on medications + dysrhythmias Atrial Fibrillation + Valvular Problems/Murmurs (mild to moderate Echo 2020) MR  Rhythm:Irregular Rate:Normal  PAH History of Left DVT 2021 Anticoagulation Eliquis   Neuro/Psych Seizures -,  Chronic Low Back Pain H/O ICH 2010  S/p syncope CVA (2002. 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), Residual Symptoms negative psych ROS   GI/Hepatic Neg liver ROS, GERD  ,  Endo/Other  negative endocrine ROS  Renal/GU negative Renal ROS  negative genitourinary   Musculoskeletal  (+) Arthritis ,  bimalleolar ankle fracture    Abdominal (+) + obese,   Peds negative pediatric ROS (+)  Hematology  (+) anemia , s/p transfusion with 2 units of PRBCs vitamin B12 deficiency   Anesthesia Other Findings Imaging Reviewed  EKG Interpretation  Date/Time:                  Tuesday August 16 2021 13:05:39 EST Ventricular Rate:         64 PR Interval:                   QRS Duration: 121 QT Interval:                 453 QTC Calculation:        468 R Axis:                         -44 Text Interpretation:      Atrial fibrillation Nonspecific IVCD with LAD Probable anteroseptal infarct, old Borderline T abnormalities, inferior leads Baseline  wander in lead(s) II III aVF Confirmed by UNCONFIRMED, DOCTOR ([id number]), editor Fredric Mare, Babette Relic [phone]) on 08/16/2021 1:20:39 PM   Past Medical History: No date: Arthritis     Comment:  "lower back on down; into all joints; into my feet"               (09/17/2012) No date: Chronic low back pain     Comment:  hx of No date: Complication of anesthesia     Comment:  "cardiac arrest with dye from lexiscan (04/15/10 records               indicate bradycardia, no ischemia, EF 56%), unstable BP               with anesthesia) No date: GERD (gastroesophageal reflux disease) 1987; 1989: Grand mal     Comment:  "take RX daily" (09/17/2012) No date: Herniated disc, cervical No date: Hypercholesteremia No date: Hypertension     Comment:  sees Dr. Jerl Mina, [facility] in [place] No date: Intracranial bleed [facility])     Comment:  december 2010 09/2019: Left leg DVT (HCC) No date: Migraines     Comment:  "2 in my life" (09/17/2012) No date: Mitral regurgitation  Comment:  a. 01/2019 Echo: EF 55-60%, mild conc LVH. RVSP 59.55mmHg.              Mildly dil LA. Mild to mod MR. Mild AI. No date: PAH (pulmonary artery hypertension) (HCC) No date: Permanent atrial fibrillation (HCC)     Comment:  a. CHA2DS2VASc = 5-->No OAC 2/2 h/o SAH; b. 03/2019 Zio:               permanent Afib - 78 bpm (46-135). Occas PVCs (1.4%               burden). No date: Retina disorder     Comment:  hx of torn retina 2002: Stroke [facility])     Comment:  LLE "just a little bit weaker" (09/17/2012) 08/2009: Subarachnoid hemorrhage (HCC)    Reproductive/Obstetrics negative OB ROS                           Anesthesia Physical Anesthesia Plan  ASA: 3  Anesthesia Plan: General   Post-op Pain Management: Regional block   Induction:   PONV Risk Score and Plan:   Airway Management Planned: Oral ETT  Additional Equipment:   Intra-op Plan:   Post-operative Plan: Extubation in OR  Informed  Consent: I have reviewed the patients History and Physical, chart, labs and discussed the procedure including the risks, benefits and alternatives for the proposed anesthesia with the patient or authorized representative who has indicated his/her understanding and acceptance.     Dental advisory given  Plan Discussed with: CRNA and Anesthesiologist  Anesthesia Plan Comments:         Anesthesia Quick Evaluation

## 2021-08-18 NOTE — Progress Notes (Signed)
Subjective:  Patient is in his hospital room.  His left leg is elevated and splinted.  Patient has a bimalleolar ankle fracture which will require surgical fixation.  His surgery is scheduled for tomorrow.  His surgery was delayed due to the fact that he came in on Eliquis.  Objective:   VITALS:   Vitals:   08/17/21 2229 08/18/21 0315 08/18/21 0752 08/18/21 1131  BP: (!) 143/62 (!) 157/74 (!) 160/80 (!) 162/89  Pulse: 68 87 76 80  Resp: 17 17 16 15   Temp: 99.2 F (37.3 C) 98.6 F (37 C) 98.3 F (36.8 C) 98.5 F (36.9 C)  TempSrc:      SpO2: 97% 95% 95% 94%  Weight:      Height:        PHYSICAL EXAM: Left lower extremity: Patient splint is clean dry and intact.  Patient can gently flex and extend his toes.  His toes are well-perfused.  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  Patient states his sensation in the left foot is at baseline.   LABS  Results for orders placed or performed during the hospital encounter of 08/16/21 (from the past 24 hour(s))  Hemoglobin and hematocrit, blood     Status: Abnormal   Collection Time: 08/17/21  2:34 PM  Result Value Ref Range   Hemoglobin 8.8 (L) 13.0 - 17.0 g/dL   HCT 29.2 (L) 39.0 - 52.0 %  Hemoglobin and hematocrit, blood     Status: Abnormal   Collection Time: 08/17/21  9:20 PM  Result Value Ref Range   Hemoglobin 8.3 (L) 13.0 - 17.0 g/dL   HCT 27.5 (L) 39.0 - 52.0 %    DG Chest 2 View  Result Date: 08/16/2021 CLINICAL DATA:  80 year old male with a history of syncope EXAM: CHEST - 2 VIEW COMPARISON:  02/17/2019 FINDINGS: The cardiac diameter appears enlarged compared to the prior, with more globular configuration and straightening of the left heart border. Stigmata of emphysema, with increased retrosternal airspace, flattened hemidiaphragms, increased AP diameter, and hyperinflation on the AP view. Hazy opacity at the bilateral lung bases with  partial obscuration of the hemidiaphragms and the heart borders. Coarsened interstitial markings bilaterally. No pneumothorax. Degenerative changes of the spine.  No acute displaced fracture IMPRESSION: Increased diameter of the heart with a configuration suspicious for pericardial effusion. Correlation with either chest CT or cardiac echo may be considered. Opacities at the bilateral lung bases, likely a combination of pleural effusion and associated atelectasis/consolidation. Coarsened interstitial markings, potentially early pulmonary edema Electronically Signed   By: Corrie Mckusick D.O.   On: 08/16/2021 15:09   DG Tibia/Fibula Left  Result Date: 08/16/2021 CLINICAL DATA:  Pain after fall. EXAM: LEFT TIBIA AND FIBULA - 2 VIEW; LEFT FOOT - COMPLETE 3+ VIEW COMPARISON:  X-ray left tibia fibula and left femur 01/30/2019 FINDINGS: Right foot: No evidence of fracture, dislocation, or joint effusion. No evidence of severe arthropathy. No aggressive appearing focal bone abnormality. Subcutaneus soft tissue edema. Right tibia fibula: Acute laterally displaced fracture of the transsyndesmotic distal fibula. Acute laterally displaced medial malleolar fracture. No definite dislocation of the left ankle. Cortical irregularity of the intra-articular anterior tibia. No acute displaced fracture or dislocation of the proximal tibia fibula. The knee is grossly unremarkable. Total left knee arthroplasty. Associated subcutaneus soft tissue edema. IMPRESSION: 1. Acute laterally displaced fracture of the transsyndesmotic distal fibula. 2. Acute laterally displaced  medial malleolar fracture. 3. Cortical irregularity of the intra-articular anterior tibia. Recommend dedicated left ankle radiograph. 4. No acute displaced fracture or dislocation of the proximal tibia fibula in the setting of a total left knee arthroplasty. 5. No acute displaced fracture or dislocation of the bones of the left foot. Electronically Signed   By: Tish Frederickson M.D.   On: 08/16/2021 15:11   DG Ankle Complete Left  Result Date: 08/16/2021 CLINICAL DATA:  Radiologist recommended dedicated ankle film to further evaluate injury EXAM: LEFT ANKLE COMPLETE - 3+ VIEW COMPARISON:  Same day radiographs. FINDINGS: Redemonstrated acute laterally displaced oblique fracture of the distal fibula involving the syndesmosis. Redemonstrated acute laterally displaced medial malleolar fracture. Similar ill-defined cortical regularity of the intra-articular anterior tibia. Possible small joint effusion. Vascular calcifications. Calcaneal enthesophytes. IMPRESSION: 1. Redemonstrated acute laterally displaced oblique fracture of the distal fibula involving the syndesmosis. 2. Redemonstrated acute laterally displaced medial malleolar fracture. 3. Similar ill-defined cortical regularity of the intra-articular anterior tibia, possibly representing fracture given trauma and small effusion. CT of the ankle could confirm and better evaluate if clinically indicated. Electronically Signed   By: Feliberto Harts M.D.   On: 08/16/2021 16:20   CT Head Wo Contrast  Result Date: 08/16/2021 CLINICAL DATA:  Fall, head trauma EXAM: CT HEAD WITHOUT CONTRAST TECHNIQUE: Contiguous axial images were obtained from the base of the skull through the vertex without intravenous contrast. COMPARISON:  CT head 08/06/2019 FINDINGS: Brain: No acute intracranial hemorrhage, mass effect, or herniation. No extra-axial fluid collections. No evidence of acute territorial infarct. No hydrocephalus. Mild-to-moderate cortical volume loss. Patchy hypodensities in the periventricular and subcortical white matter, likely secondary to chronic microvascular ischemic changes. Vascular: Calcified plaques in the carotid siphons. Skull: Normal. Negative for fracture or focal lesion. Sinuses/Orbits: No acute finding. Other: None. IMPRESSION: Chronic changes with no acute intracranial process identified. Electronically Signed    By: Jannifer Hick M.D.   On: 08/16/2021 14:41   CT Angio Chest PE W and/or Wo Contrast  Result Date: 08/16/2021 CLINICAL DATA:  Witnessed fall, dizziness, anemia EXAM: CT ANGIOGRAPHY CHEST CT ABDOMEN AND PELVIS WITH CONTRAST TECHNIQUE: Multidetector CT imaging of the chest was performed using the standard protocol during bolus administration of intravenous contrast. Multiplanar CT image reconstructions and MIPs were obtained to evaluate the vascular anatomy. Multidetector CT imaging of the abdomen and pelvis was performed using the standard protocol during bolus administration of intravenous contrast. CONTRAST:  OMNIPAQUE IOHEXOL 350 MG/ML SOLN COMPARISON:  08/16/2021 FINDINGS: CTA CHEST FINDINGS Cardiovascular: This is a technically adequate evaluation of the pulmonary vasculature. No filling defects or pulmonary emboli. The heart is enlarged, with prominent biatrial dilation. No pericardial effusion. Extensive atherosclerosis of the coronary vasculature. No evidence of thoracic aortic aneurysm or dissection. Mild aortic atherosclerosis. Mediastinum/Nodes: No enlarged mediastinal, hilar, or axillary lymph nodes. Thyroid gland, trachea, and esophagus demonstrate no significant findings. Lungs/Pleura: No airspace disease, effusion, or pneumothorax. Central airways are patent. Musculoskeletal: No acute displaced fractures. Reconstructed images demonstrate no additional findings. Review of the MIP images confirms the above findings. CT ABDOMEN and PELVIS FINDINGS Hepatobiliary: No focal liver abnormality is seen. No gallstones, gallbladder wall thickening, or biliary dilatation. Pancreas: Unremarkable. No pancreatic ductal dilatation or surrounding inflammatory changes. Spleen: Normal in size without focal abnormality. 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 Mild nonspecific thickening of the left adrenal gland measuring up to 1.4 cm. 4 mm nonobstructing calculus mid right kidney. 2  mm nonobstructing calculus lower pole left  kidney. There is mild bilateral renal cortical thinning. No obstructive uropathy. The bladder is unremarkable. Stomach/Bowel: No bowel obstruction or ileus. Normal appendix right lower quadrant. No bowel wall thickening or inflammatory change. Vascular/Lymphatic: Aortic atherosclerosis. No enlarged abdominal or pelvic lymph nodes. Reproductive: Prostate is unremarkable. Other: No free fluid or free gas.  No abdominal wall hernia. Musculoskeletal: No acute displaced fractures. Postsurgical changes at L4-5. Reconstructed images demonstrate no additional findings. Review of the MIP images confirms the above findings. IMPRESSION: 1. No evidence of pulmonary embolus. 2. Cardiomegaly, with no evidence of pericardial effusion. 3. No acute intrathoracic, intra-abdominal, or intrapelvic trauma. 4. Punctate bilateral nonobstructing renal calculi. 5.  Aortic Atherosclerosis (ICD10-I70.0). Electronically Signed   By: Randa Ngo M.D.   On: 08/16/2021 16:04   CT Cervical Spine Wo Contrast  Result Date: 08/16/2021 CLINICAL DATA:  Neck pain EXAM: CT CERVICAL SPINE WITHOUT CONTRAST TECHNIQUE: Multidetector CT imaging of the cervical spine was performed without intravenous contrast. Multiplanar CT image reconstructions were also generated. COMPARISON:  MRI cervical spine 08/18/2014 FINDINGS: Alignment: Grade 1 anterolisthesis of C3 on C4 and C4 on C5. Skull base and vertebrae: No acute fracture. No primary bone lesion or focal pathologic process. Soft tissues and spinal canal: No prevertebral fluid or swelling. No visible canal hematoma. Disc levels: Moderate intervertebral disc height loss at C5-C6 and mild-to-moderate at C6-C7. Dorsal endplate osteophytes and uncovertebral spurring most prominent at C5-C6. Facet arthropathy which is worse on the right. Multilevel neural foraminal narrowing most significant at C5-C6. Upper chest: No acute process identified. Other: None.  IMPRESSION: Chronic degenerative changes with no acute fracture or subluxation identified. Electronically Signed   By: Ofilia Neas M.D.   On: 08/16/2021 14:44   CT Abdomen Pelvis W Contrast  Result Date: 08/16/2021 CLINICAL DATA:  Witnessed fall, dizziness, anemia EXAM: CT ANGIOGRAPHY CHEST CT ABDOMEN AND PELVIS WITH CONTRAST TECHNIQUE: Multidetector CT imaging of the chest was performed using the standard protocol during bolus administration of intravenous contrast. Multiplanar CT image reconstructions and MIPs were obtained to evaluate the vascular anatomy. Multidetector CT imaging of the abdomen and pelvis was performed using the standard protocol during bolus administration of intravenous contrast. CONTRAST:  146mL OMNIPAQUE IOHEXOL 350 MG/ML SOLN COMPARISON:  08/16/2021 FINDINGS: CTA CHEST FINDINGS Cardiovascular: This is a technically adequate evaluation of the pulmonary vasculature. No filling defects or pulmonary emboli. The heart is enlarged, with prominent biatrial dilation. No pericardial effusion. Extensive atherosclerosis of the coronary vasculature. No evidence of thoracic aortic aneurysm or dissection. Mild aortic atherosclerosis. Mediastinum/Nodes: No enlarged mediastinal, hilar, or axillary lymph nodes. Thyroid gland, trachea, and esophagus demonstrate no significant findings. Lungs/Pleura: No airspace disease, effusion, or pneumothorax. Central airways are patent. Musculoskeletal: No acute displaced fractures. Reconstructed images demonstrate no additional findings. Review of the MIP images confirms the above findings. CT ABDOMEN and PELVIS FINDINGS Hepatobiliary: No focal liver abnormality is seen. No gallstones, gallbladder wall thickening, or biliary dilatation. Pancreas: Unremarkable. No pancreatic ductal dilatation or surrounding inflammatory changes. Spleen: Normal in size without focal abnormality. 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 Mild nonspecific  thickening of the left adrenal gland measuring up to 1.4 cm. 4 mm nonobstructing calculus mid right kidney. 2 mm nonobstructing calculus lower pole left kidney. There is mild bilateral renal cortical thinning. No obstructive uropathy. The bladder is unremarkable. Stomach/Bowel: No bowel obstruction or ileus. Normal appendix right lower quadrant. No bowel wall thickening or inflammatory change. Vascular/Lymphatic: Aortic atherosclerosis. No enlarged abdominal or pelvic lymph nodes. Reproductive: Prostate  is unremarkable. Other: No free fluid or free gas.  No abdominal wall hernia. Musculoskeletal: No acute displaced fractures. Postsurgical changes at L4-5. Reconstructed images demonstrate no additional findings. Review of the MIP images confirms the above findings. IMPRESSION: 1. No evidence of pulmonary embolus. 2. Cardiomegaly, with no evidence of pericardial effusion. 3. No acute intrathoracic, intra-abdominal, or intrapelvic trauma. 4. Punctate bilateral nonobstructing renal calculi. 5.  Aortic Atherosclerosis (ICD10-I70.0). Electronically Signed   By: Randa Ngo M.D.   On: 08/16/2021 16:04   DG Foot Complete Left  Result Date: 08/16/2021 CLINICAL DATA:  Pain after fall. EXAM: LEFT TIBIA AND FIBULA - 2 VIEW; LEFT FOOT - COMPLETE 3+ VIEW COMPARISON:  X-ray left tibia fibula and left femur 01/30/2019 FINDINGS: Right foot: No evidence of fracture, dislocation, or joint effusion. No evidence of severe arthropathy. No aggressive appearing focal bone abnormality. Subcutaneus soft tissue edema. Right tibia fibula: Acute laterally displaced fracture of the transsyndesmotic distal fibula. Acute laterally displaced medial malleolar fracture. No definite dislocation of the left ankle. Cortical irregularity of the intra-articular anterior tibia. No acute displaced fracture or dislocation of the proximal tibia fibula. The knee is grossly unremarkable. Total left knee arthroplasty. Associated subcutaneus soft tissue  edema. IMPRESSION: 1. Acute laterally displaced fracture of the transsyndesmotic distal fibula. 2. Acute laterally displaced medial malleolar fracture. 3. Cortical irregularity of the intra-articular anterior tibia. Recommend dedicated left ankle radiograph. 4. No acute displaced fracture or dislocation of the proximal tibia fibula in the setting of a total left knee arthroplasty. 5. No acute displaced fracture or dislocation of the bones of the left foot. Electronically Signed   By: Iven Finn M.D.   On: 08/16/2021 15:11    Assessment/Plan:     Principal Problem:   GI bleeding  I reviewed the details of the operation as well as the postoperative course with the patient and his wife.  She will be n.p.o. after midnight in preparation for surgery tomorrow.  Continue elevation and splinting of the left lower extremity until surgery.  I answered their questions today.  The understood and agreed with this plan.    Thornton Park , MD 08/18/2021, 1:07 PM

## 2021-08-19 ENCOUNTER — Encounter
Admission: EM | Disposition: A | Discharge status: Skilled Nursing Facility | Payer: Self-pay | Source: Home / Self Care | Attending: Internal Medicine

## 2021-08-19 ENCOUNTER — Inpatient Hospital Stay: Payer: Medicare Other

## 2021-08-19 ENCOUNTER — Inpatient Hospital Stay: Payer: Medicare Other | Admitting: Anesthesiology

## 2021-08-19 ENCOUNTER — Encounter: Payer: Self-pay | Admitting: Family Medicine

## 2021-08-19 DIAGNOSIS — S82892A Other fracture of left lower leg, initial encounter for closed fracture: Secondary | ICD-10-CM | POA: Diagnosis present

## 2021-08-19 HISTORY — PX: ORIF ANKLE FRACTURE: SHX5408

## 2021-08-19 LAB — CBC WITH DIFFERENTIAL/PLATELET
Abs Immature Granulocytes: 0.02 10*3/uL (ref 0.00–0.07)
Basophils Absolute: 0 10*3/uL (ref 0.0–0.1)
Basophils Relative: 0 %
Eosinophils Absolute: 0.1 10*3/uL (ref 0.0–0.5)
Eosinophils Relative: 1 %
HCT: 26.8 % — ABNORMAL LOW (ref 39.0–52.0)
Hemoglobin: 8.1 g/dL — ABNORMAL LOW (ref 13.0–17.0)
Immature Granulocytes: 0 %
Lymphocytes Relative: 17 %
Lymphs Abs: 1.4 10*3/uL (ref 0.7–4.0)
MCH: 23 pg — ABNORMAL LOW (ref 26.0–34.0)
MCHC: 30.2 g/dL (ref 30.0–36.0)
MCV: 76.1 fL — ABNORMAL LOW (ref 80.0–100.0)
Monocytes Absolute: 0.8 10*3/uL (ref 0.1–1.0)
Monocytes Relative: 11 %
Neutro Abs: 5.5 10*3/uL (ref 1.7–7.7)
Neutrophils Relative %: 71 %
Platelets: 166 10*3/uL (ref 150–400)
RBC: 3.52 MIL/uL — ABNORMAL LOW (ref 4.22–5.81)
RDW: 17.8 % — ABNORMAL HIGH (ref 11.5–15.5)
WBC: 7.9 10*3/uL (ref 4.0–10.5)
nRBC: 0 % (ref 0.0–0.2)

## 2021-08-19 SURGERY — OPEN REDUCTION INTERNAL FIXATION (ORIF) ANKLE FRACTURE
Anesthesia: General | Site: Ankle | Laterality: Left

## 2021-08-19 MED ORDER — SENNA 8.6 MG PO TABS
1.0000 | ORAL_TABLET | Freq: Two times a day (BID) | ORAL | Status: DC
  Administered 2021-08-19 – 2021-08-23 (×8): 8.6 mg via ORAL
  Filled 2021-08-19 (×8): qty 1

## 2021-08-19 MED ORDER — BUPIVACAINE HCL (PF) 0.25 % IJ SOLN
INTRAMUSCULAR | Status: AC
  Filled 2021-08-19: qty 30

## 2021-08-19 MED ORDER — PHENYLEPHRINE HCL-NACL 20-0.9 MG/250ML-% IV SOLN
INTRAVENOUS | Status: DC | PRN
  Administered 2021-08-19: 30 ug/min via INTRAVENOUS

## 2021-08-19 MED ORDER — FENTANYL CITRATE (PF) 100 MCG/2ML IJ SOLN
INTRAMUSCULAR | Status: DC | PRN
  Administered 2021-08-19: 50 ug via INTRAVENOUS
  Administered 2021-08-19 (×3): 25 ug via INTRAVENOUS

## 2021-08-19 MED ORDER — ALUM & MAG HYDROXIDE-SIMETH 200-200-20 MG/5ML PO SUSP: 30.0000 mL | ORAL | Status: DC | PRN

## 2021-08-19 MED ORDER — ROCURONIUM BROMIDE 100 MG/10ML IV SOLN
INTRAVENOUS | Status: DC | PRN
  Administered 2021-08-19: 50 mg via INTRAVENOUS

## 2021-08-19 MED ORDER — TRAMADOL HCL 50 MG PO TABS
50.0000 mg | ORAL_TABLET | Freq: Four times a day (QID) | ORAL | Status: DC
  Administered 2021-08-19 – 2021-08-23 (×16): 50 mg via ORAL
  Filled 2021-08-19 (×17): qty 1

## 2021-08-19 MED ORDER — FENTANYL CITRATE (PF) 100 MCG/2ML IJ SOLN: 25.0000 ug | INTRAMUSCULAR | Status: DC | PRN

## 2021-08-19 MED ORDER — DEXAMETHASONE SODIUM PHOSPHATE 10 MG/ML IJ SOLN
INTRAMUSCULAR | Status: DC | PRN
  Administered 2021-08-19: 5 mg via INTRAVENOUS

## 2021-08-19 MED ORDER — TRAMADOL HCL 50 MG PO TABS
50.0000 mg | ORAL_TABLET | Freq: Once | ORAL | Status: DC | PRN
  Filled 2021-08-19: qty 1

## 2021-08-19 MED ORDER — ACETAMINOPHEN 10 MG/ML IV SOLN
INTRAVENOUS | Status: DC | PRN
  Administered 2021-08-19: 1000 mg via INTRAVENOUS

## 2021-08-19 MED ORDER — MORPHINE SULFATE (PF) 2 MG/ML IV SOLN
0.5000 mg | INTRAVENOUS | Status: DC | PRN
  Administered 2021-08-20: 1 mg via INTRAVENOUS
  Filled 2021-08-19: qty 1

## 2021-08-19 MED ORDER — METHOCARBAMOL 500 MG PO TABS
500.0000 mg | ORAL_TABLET | Freq: Four times a day (QID) | ORAL | Status: DC | PRN
  Administered 2021-08-19 – 2021-08-21 (×4): 500 mg via ORAL
  Filled 2021-08-19 (×4): qty 1

## 2021-08-19 MED ORDER — DOCUSATE SODIUM 100 MG PO CAPS
100.0000 mg | ORAL_CAPSULE | Freq: Two times a day (BID) | ORAL | Status: DC
  Administered 2021-08-19 – 2021-08-23 (×8): 100 mg via ORAL
  Filled 2021-08-19 (×8): qty 1

## 2021-08-19 MED ORDER — CEFAZOLIN SODIUM-DEXTROSE 2-4 GM/100ML-% IV SOLN
2.0000 g | Freq: Four times a day (QID) | INTRAVENOUS | Status: AC
  Administered 2021-08-19: 2 g via INTRAVENOUS
  Filled 2021-08-19 (×2): qty 100

## 2021-08-19 MED ORDER — PROMETHAZINE HCL 25 MG/ML IJ SOLN: 6.2500 mg | INTRAMUSCULAR | Status: DC | PRN

## 2021-08-19 MED ORDER — ACETAMINOPHEN 10 MG/ML IV SOLN
INTRAVENOUS | Status: AC
  Filled 2021-08-19: qty 100

## 2021-08-19 MED ORDER — PROPOFOL 10 MG/ML IV BOLUS
INTRAVENOUS | Status: DC | PRN
  Administered 2021-08-19: 100 mg via INTRAVENOUS

## 2021-08-19 MED ORDER — ONDANSETRON HCL 4 MG PO TABS
4.0000 mg | ORAL_TABLET | Freq: Four times a day (QID) | ORAL | Status: DC | PRN
  Administered 2021-08-20: 4 mg via ORAL
  Filled 2021-08-19: qty 1

## 2021-08-19 MED ORDER — ONDANSETRON HCL 4 MG/2ML IJ SOLN
4.0000 mg | Freq: Four times a day (QID) | INTRAMUSCULAR | Status: DC | PRN
  Administered 2021-08-20: 4 mg via INTRAVENOUS
  Filled 2021-08-19: qty 2

## 2021-08-19 MED ORDER — BUPIVACAINE HCL 0.25 % IJ SOLN
INTRAMUSCULAR | Status: DC | PRN
  Administered 2021-08-19: 30 mL

## 2021-08-19 MED ORDER — PROPOFOL 10 MG/ML IV BOLUS
INTRAVENOUS | Status: AC
  Filled 2021-08-19: qty 20

## 2021-08-19 MED ORDER — CEFAZOLIN SODIUM-DEXTROSE 2-4 GM/100ML-% IV SOLN
INTRAVENOUS | Status: AC
  Administered 2021-08-19: 2 g via INTRAVENOUS
  Filled 2021-08-19: qty 100

## 2021-08-19 MED ORDER — ACETAMINOPHEN 500 MG PO TABS
500.0000 mg | ORAL_TABLET | Freq: Four times a day (QID) | ORAL | Status: AC
  Administered 2021-08-19 – 2021-08-20 (×3): 500 mg via ORAL
  Filled 2021-08-19 (×3): qty 1

## 2021-08-19 MED ORDER — NEOMYCIN-POLYMYXIN B GU 40-200000 IR SOLN
Status: AC
  Filled 2021-08-19: qty 2

## 2021-08-19 MED ORDER — LIDOCAINE HCL (CARDIAC) PF 100 MG/5ML IV SOSY
PREFILLED_SYRINGE | INTRAVENOUS | Status: DC | PRN
  Administered 2021-08-19: 60 mg via INTRAVENOUS

## 2021-08-19 MED ORDER — FENTANYL CITRATE (PF) 100 MCG/2ML IJ SOLN
INTRAMUSCULAR | Status: AC
  Filled 2021-08-19: qty 2

## 2021-08-19 MED ORDER — SODIUM CHLORIDE 0.9 % IR SOLN
Status: DC | PRN
  Administered 2021-08-19: 500 mL

## 2021-08-19 MED ORDER — HYDROCODONE-ACETAMINOPHEN 7.5-325 MG PO TABS
1.0000 | ORAL_TABLET | ORAL | Status: DC | PRN
  Administered 2021-08-20 – 2021-08-22 (×5): 1 via ORAL
  Filled 2021-08-19 (×2): qty 1
  Filled 2021-08-19: qty 2
  Filled 2021-08-19 (×2): qty 1

## 2021-08-19 MED ORDER — ONDANSETRON HCL 4 MG/2ML IJ SOLN
INTRAMUSCULAR | Status: DC | PRN
  Administered 2021-08-19: 4 mg via INTRAVENOUS

## 2021-08-19 MED ORDER — ACETAMINOPHEN 10 MG/ML IV SOLN: 1000.0000 mg | Freq: Once | INTRAVENOUS | Status: DC | PRN

## 2021-08-19 MED ORDER — PHENOL 1.4 % MT LIQD
1.0000 | OROMUCOSAL | Status: DC | PRN
  Filled 2021-08-19: qty 177

## 2021-08-19 MED ORDER — BISACODYL 10 MG RE SUPP: 10.0000 mg | Freq: Every day | RECTAL | Status: DC | PRN

## 2021-08-19 MED ORDER — POLYETHYLENE GLYCOL 3350 17 G PO PACK: 17.0000 g | PACK | Freq: Every day | ORAL | Status: DC | PRN

## 2021-08-19 MED ORDER — PHENYLEPHRINE HCL (PRESSORS) 10 MG/ML IV SOLN
INTRAVENOUS | Status: DC | PRN
  Administered 2021-08-19 (×2): 100 ug via INTRAVENOUS

## 2021-08-19 MED ORDER — HYDROCODONE-ACETAMINOPHEN 5-325 MG PO TABS
1.0000 | ORAL_TABLET | ORAL | Status: DC | PRN
  Administered 2021-08-20 – 2021-08-21 (×5): 2 via ORAL
  Administered 2021-08-22 (×2): 1 via ORAL
  Filled 2021-08-19 (×3): qty 2
  Filled 2021-08-19: qty 1
  Filled 2021-08-19: qty 2
  Filled 2021-08-19: qty 1
  Filled 2021-08-19: qty 2

## 2021-08-19 MED ORDER — SUGAMMADEX SODIUM 200 MG/2ML IV SOLN
INTRAVENOUS | Status: DC | PRN
Start: 2021-08-19 — End: 2021-08-19
  Administered 2021-08-19: 200 mg via INTRAVENOUS

## 2021-08-19 MED ORDER — ACETAMINOPHEN 325 MG PO TABS
325.0000 mg | ORAL_TABLET | Freq: Four times a day (QID) | ORAL | Status: DC | PRN
  Administered 2021-08-20: 500 mg via ORAL

## 2021-08-19 MED ORDER — METHOCARBAMOL 1000 MG/10ML IJ SOLN
500.0000 mg | Freq: Four times a day (QID) | INTRAVENOUS | Status: DC | PRN
  Filled 2021-08-19: qty 5

## 2021-08-19 MED ORDER — CEFAZOLIN SODIUM-DEXTROSE 2-4 GM/100ML-% IV SOLN
2.0000 g | INTRAVENOUS | Status: AC
  Administered 2021-08-19: 2 g via INTRAVENOUS

## 2021-08-19 MED ORDER — MENTHOL 3 MG MT LOZG
1.0000 | LOZENGE | OROMUCOSAL | Status: DC | PRN
  Filled 2021-08-19: qty 9

## 2021-08-19 SURGICAL SUPPLY — 58 items
BIT DRILL 2.5X110 QC LCP DISP (BIT) ×1 IMPLANT
BIT DRILL CANN 2.7X625 NONSTRL (BIT) ×1 IMPLANT
BLADE SURG 15 STRL LF DISP TIS (BLADE) ×1 IMPLANT
BLADE SURG 15 STRL SS (BLADE) ×2
BNDG CMPR STD VLCR NS LF 5.8X4 (GAUZE/BANDAGES/DRESSINGS) ×2
BNDG COHESIVE 4X5 TAN ST LF (GAUZE/BANDAGES/DRESSINGS) ×2 IMPLANT
BNDG ELASTIC 4X5.8 VLCR NS LF (GAUZE/BANDAGES/DRESSINGS) ×4 IMPLANT
BNDG ESMARK 6X12 TAN STRL LF (GAUZE/BANDAGES/DRESSINGS) ×2 IMPLANT
CUFF TOURN SGL QUICK 24 (TOURNIQUET CUFF)
CUFF TOURN SGL QUICK 34 (TOURNIQUET CUFF)
CUFF TRNQT CYL 24X4X16.5-23 (TOURNIQUET CUFF) IMPLANT
CUFF TRNQT CYL 34X4.125X (TOURNIQUET CUFF) IMPLANT
DRAPE FLUOR MINI C-ARM 54X84 (DRAPES) ×2 IMPLANT
DRAPE INCISE IOBAN 66X45 STRL (DRAPES) ×2 IMPLANT
DRAPE U-SHAPE 47X51 STRL (DRAPES) ×2 IMPLANT
DURAPREP 26ML APPLICATOR (WOUND CARE) ×4 IMPLANT
ELECT REM PT RETURN 9FT ADLT (ELECTROSURGICAL) ×2
ELECTRODE REM PT RTRN 9FT ADLT (ELECTROSURGICAL) ×1 IMPLANT
GAUZE 4X4 16PLY ~~LOC~~+RFID DBL (SPONGE) ×2 IMPLANT
GAUZE SPONGE 4X4 12PLY STRL (GAUZE/BANDAGES/DRESSINGS) ×2 IMPLANT
GAUZE XEROFORM 1X8 LF (GAUZE/BANDAGES/DRESSINGS) ×2 IMPLANT
GLOVE SURG ORTHO LTX SZ9 (GLOVE) ×4 IMPLANT
GLOVE SURG UNDER POLY LF SZ9 (GLOVE) ×2 IMPLANT
GOWN STRL REUS TWL 2XL XL LVL4 (GOWN DISPOSABLE) ×2 IMPLANT
GOWN STRL REUS W/ TWL LRG LVL3 (GOWN DISPOSABLE) ×1 IMPLANT
GOWN STRL REUS W/TWL LRG LVL3 (GOWN DISPOSABLE) ×2
GUIDEWARE NON THREAD 1.25X150 (WIRE) ×4
GUIDEWIRE NON THREAD 1.25X150 (WIRE) IMPLANT
KIT TURNOVER KIT A (KITS) ×2 IMPLANT
LABEL OR SOLS (LABEL) ×2 IMPLANT
MANIFOLD NEPTUNE II (INSTRUMENTS) ×2 IMPLANT
NS IRRIG 1000ML POUR BTL (IV SOLUTION) ×2 IMPLANT
PACK EXTREMITY ARMC (MISCELLANEOUS) ×2 IMPLANT
PAD ABD DERMACEA PRESS 5X9 (GAUZE/BANDAGES/DRESSINGS) ×4 IMPLANT
PAD CAST CTTN 4X4 STRL (SOFTGOODS) ×3 IMPLANT
PADDING CAST COTTON 4X4 STRL (SOFTGOODS) ×6
PLATE LCP 3.5 1/3 TUB 10HX117 (Plate) ×1 IMPLANT
SCREW CANC FT 4.0X22 (Screw) ×2 IMPLANT
SCREW CANC FT/18 4.0 (Screw) ×1 IMPLANT
SCREW CANN L THRD/50 4.0 (Screw) ×2 IMPLANT
SCREW CORTEX 3.5 14MM (Screw) ×5 IMPLANT
SCREW CORTEX 3.5 16MM (Screw) ×2 IMPLANT
SCREW CORTEX 3.5 22MM (Screw) ×1 IMPLANT
SCREW LOCK CORT ST 3.5X14 (Screw) IMPLANT
SCREW LOCK CORT ST 3.5X16 (Screw) IMPLANT
SCREW LOCK CORT ST 3.5X22 (Screw) IMPLANT
SPLINT CAST 1 STEP 4X30 (MISCELLANEOUS) ×4 IMPLANT
SPONGE T-LAP 18X18 ~~LOC~~+RFID (SPONGE) ×2 IMPLANT
STAPLER SKIN PROX 35W (STAPLE) ×2 IMPLANT
STOCKINETTE STRL 6IN 960660 (GAUZE/BANDAGES/DRESSINGS) ×2 IMPLANT
STRIP CLOSURE SKIN 1/2X4 (GAUZE/BANDAGES/DRESSINGS) ×4 IMPLANT
SUT VIC AB 2-0 CT1 (SUTURE) IMPLANT
SUT VIC AB 2-0 SH 27 (SUTURE) ×6
SUT VIC AB 2-0 SH 27XBRD (SUTURE) ×2 IMPLANT
SYR 30ML LL (SYRINGE) ×2 IMPLANT
TAPE PAPER 2X10 WHT MICROPORE (GAUZE/BANDAGES/DRESSINGS) ×2 IMPLANT
WASHER 7MM DIA (Washer) ×2 IMPLANT
WATER STERILE IRR 500ML POUR (IV SOLUTION) ×2 IMPLANT

## 2021-08-19 NOTE — TOC Progression Note (Signed)
Transition of Care [facility]) - Progression Note    Patient Details  Name: AKEEL REFFNER MRN: [id number] Date of Birth: [birth year]  [facility] CM/SW Contact  Marlowe Sax, RN Phone Number: 08/19/2021, 9:45 AM  Clinical Narrative:   The patient is scheduled for Surgery on his ankle today, Plans to return home at DC, He will be seen by PT for eval after surgery, TOC to continue to Monitor for needs and assist with DC planning         Expected Discharge Plan and Services                                                 Social Determinants of Health (SDOH) Interventions    Readmission Risk Interventions No flowsheet data found.

## 2021-08-19 NOTE — Anesthesia Procedure Notes (Signed)
Procedure Name: Intubation Date/Time: 08/19/2021 11:10 AM Performed by: Jaye Beagle, CRNA Pre-anesthesia Checklist: Patient identified, Emergency Drugs available, Suction available and Patient being monitored Patient Re-evaluated:Patient Re-evaluated prior to induction Oxygen Delivery Method: Circle system utilized Preoxygenation: Pre-oxygenation with 100% oxygen Induction Type: IV induction Ventilation: Mask ventilation without difficulty Laryngoscope Size: McGraph and 4 Grade View: Grade I Tube type: Oral Tube size: 7.5 mm Number of attempts: 1 Airway Equipment and Method: Stylet and Oral airway Placement Confirmation: ETT inserted through vocal cords under direct vision, positive ETCO2 and breath sounds checked- equal and bilateral Tube secured with: Tape Dental Injury: Teeth and Oropharynx as per pre-operative assessment

## 2021-08-19 NOTE — Progress Notes (Signed)
Subjective:  POST OP CHECK s/p ORIF of left bimalleolar ankle fracture.   Patient is seen sitting up in his hospital bed.  His family is at the bedside and his nurses in the room.  Patient reports left ankle pain as mild.  Patient denies shortness of breath, chest pain or abdominal pain.  Objective:   VITALS:   Vitals:   08/19/21 1345 08/19/21 1400 08/19/21 1449 08/19/21 1608  BP: (!) 160/71 (!) 148/80 (!) 159/80 (!) 169/90  Pulse: 62 67 67 70  Resp: 17 17  17   Temp:  (!) 97.5 F (36.4 C) 98.1 F (36.7 C) 98.1 F (36.7 C)  TempSrc:      SpO2: 97% 95% 94% 96%  Weight:      Height:        PHYSICAL EXAM: Left lower extremity: Patient's splint and dressing are clean dry and intact.  His toes are well-perfused.  He has diminished sensation to light touch in all 5 toes of the left foot due to baseline neuropathy.  Patient's compartments are soft and compressible.  He can flex and extend his toes.   LABS  Results for orders placed or performed during the hospital encounter of 08/16/21 (from the past 24 hour(s))  CBC with Differential/Platelet     Status: Abnormal   Collection Time: 08/19/21  3:36 AM  Result Value Ref Range   WBC 7.9 4.0 - 10.5 K/uL   RBC 3.52 (L) 4.22 - 5.81 MIL/uL   Hemoglobin 8.1 (L) 13.0 - 17.0 g/dL   HCT 08/21/21 (L) 48.2 - 70.7 %   MCV 76.1 (L) 80.0 - 100.0 fL   MCH 23.0 (L) 26.0 - 34.0 pg   MCHC 30.2 30.0 - 36.0 g/dL   RDW 86.7 (H) 54.4 - 92.0 %   Platelets 166 150 - 400 K/uL   nRBC 0.0 0.0 - 0.2 %   Neutrophils Relative % 71 %   Neutro Abs 5.5 1.7 - 7.7 K/uL   Lymphocytes Relative 17 %   Lymphs Abs 1.4 0.7 - 4.0 K/uL   Monocytes Relative 11 %   Monocytes Absolute 0.8 0.1 - 1.0 K/uL   Eosinophils Relative 1 %   Eosinophils Absolute 0.1 0.0 - 0.5 K/uL   Basophils Relative 0 %   Basophils Absolute 0.0 0.0 - 0.1 K/uL   Immature Granulocytes 0 %   Abs Immature Granulocytes 0.02 0.00 - 0.07 K/uL    DG Ankle Complete Left  Result Date:  08/19/2021 CLINICAL DATA:  Postoperative, ORIF EXAM: LEFT ANKLE COMPLETE - 3+ VIEW COMPARISON:  08/16/2021 FINDINGS: Status post plate and screw fixation of the distal left fibula and screw fixation of the medial malleolus with anatomic alignment of fractures. Soft tissue edema about the foot and ankle. Cast material about the foot and ankle. IMPRESSION: Status post plate and screw fixation of the distal left fibula and screw fixation of the medial malleolus with anatomic alignment of fractures. Electronically Signed   By: 08/18/2021 M.D.   On: 08/19/2021 13:52   DG MINI C-ARM IMAGE ONLY  Result Date: 08/19/2021 There is no interpretation for this exam.  This order is for images obtained during a surgical procedure.  Please See "Surgeries" Tab for more information regarding the procedure.    Assessment/Plan: Day of Surgery   Principal Problem:   Closed left ankle fracture Active Problems:   GI bleeding  I reviewed the patient's postoperative x-rays which demonstrate anatomic alignment of his fractures.  The mortise is symmetric  and there is no widening the syndesmosis.  Patient will complete 24 hours of postop antibiotics.  He will continue to elevate the left lower extremity whenever possible.  An ice pack may be applied to the left ankle.  Patient will begin physical and occupational therapy tomorrow.  He was encouraged to use incentive spirometry each hour while awake.  Labs will be checked in the a.m.    Juanell Fairly , MD 08/19/2021, 5:32 PM

## 2021-08-19 NOTE — Progress Notes (Addendum)
Progress Note    [name]  TDD:[id number] DOB: September 22, 1941  DOA: 08/16/2021 PCP: Jerl Mina, MD      Brief Narrative:    Medical records reviewed and are as summarized below:  [name] is a 80 y.o. male with medical history significant for left lower extremity DVT in 2011, subarachnoid hemorrhage in 2010, hypertension, dyslipidemia, migraine, chronic atrial fibrillation, chronic low back pain, who presented to the hospital because of syncope leading to a fall and left ankle injury.      Assessment/Plan:   Principal Problem:   Closed left ankle fracture Active Problems:   GI bleeding   Body mass index is 30.92 kg/m.  (Obesity)  S/p syncope: Probably from anemia.  Acute blood loss anemia: H&H is stable.  S/p transfusion with 2 units of PRBCs.  Eliquis has been held.  Positive heme stool: No melena or hematemesis.  Patient is n.p.o. for surgery today.  Continue Protonix.    Left ankle fracture: Plan for left ankle surgery today.  Analgesics as needed for pain.  Follow-up with orthopedic surgeon.    Other comorbidities include hypertension, dyslipidemia, vitamin B12 deficiency, atrial fibrillation on Eliquis    Diet Order             Diet NPO time specified Except for: Sips with Meds  Diet effective midnight                      Consultants: Gastroenterologist Orthopedic surgeon  Procedures: None    Medications:    [MAR Hold] aliskiren  300 mg Oral Daily   [MAR Hold] carbamazepine  200 mg Oral BID   [MAR Hold] doxazosin  1 mg Oral QHS   [MAR Hold] irbesartan  300 mg Oral Daily   [MAR Hold] mupirocin ointment  1 application Nasal BID   [MAR Hold] omega-3 acid ethyl esters  1 g Oral Daily   [MAR Hold] pantoprazole  40 mg Intravenous Q12H   [MAR Hold] simvastatin  20 mg Oral BID   [MAR Hold] vitamin B-12  1,000 mcg Oral Daily   Continuous Infusions:  sodium chloride 100 mL/hr at 08/19/21 1057   pantoprazole 8 mg/hr  (08/18/21 0215)   [MAR Hold] promethazine (PHENERGAN) injection (IM or IVPB) Stopped (08/16/21 1903)     Anti-infectives (From admission, onward)    Start     Dose/Rate Route Frequency Ordered Stop   08/19/21 1038  ceFAZolin (ANCEF) 2-4 GM/100ML-% IVPB       Note to Pharmacy: Desma Paganini   : cabinet override      08/19/21 1038 08/19/21 1122   08/19/21 1033  ceFAZolin (ANCEF) IVPB 2g/100 mL premix        2 g 200 mL/hr over 30 Minutes Intravenous 30 min pre-op 08/19/21 1033 08/19/21 1122              Family Communication/Anticipated D/C date and plan/Code Status   DVT prophylaxis: Place and maintain sequential compression device Start: 08/16/21 2032 SCDs Start: 08/16/21 2010     Code Status: Full Code  Family Communication: Plan discussed with his wife and son at the bedside  Disposition Plan: Discharge to home versus SNF in 2 to 3 days   Status is: Inpatient  Remains inpatient appropriate because: Left ankle fracture awaiting surgery           Subjective:   No hematemesis, abdominal pain or rectal bleeding.  Pain in the left foot is bearable.  His wife and son were at the bedside.  Objective:    Vitals:   08/18/21 1519 08/18/21 1953 08/19/21 0539 08/19/21 1045  BP: (!) 141/65 (!) 165/79 (!) 181/95 (!) 172/93  Pulse: (!) 59 77 76 67  Resp: 15 20 20 16   Temp: 98.1 F (36.7 C) 99.9 F (37.7 C) 97.9 F (36.6 C) 99 F (37.2 C)  TempSrc:   Oral Tympanic  SpO2: 96% 98% 95% 98%  Weight:      Height:       No data found.   Intake/Output Summary (Last 24 hours) at 08/19/2021 1214 Last data filed at 08/19/2021 0845 Gross per 24 hour  Intake 240 ml  Output 800 ml  Net -560 ml   Filed Weights   08/16/21 1319 08/17/21 0300  Weight: 101 kg 103.4 kg    Exam:  GEN: NAD SKIN: Warm and dry EYES: EOMI ENT: MMM CV: RRR PULM: CTA B ABD: soft, ND, NT, +BS CNS: AAO x 3, non focal EXT: Cast on the left lower extremity          Data  Reviewed:   I have personally reviewed following labs and imaging studies:  Labs: Labs show the following:   Basic Metabolic Panel: Recent Labs  Lab 08/16/21 1411 08/17/21 0420  NA 136 137  K 3.9 4.7  CL 105 106  CO2 26 27  GLUCOSE 120* 86  BUN 23 19  CREATININE 1.25* 0.99  CALCIUM 8.8* 8.7*   GFR Estimated Creatinine Clearance: 72.8 mL/min (by C-G formula based on SCr of 0.99 mg/dL). Liver Function Tests: Recent Labs  Lab 08/16/21 1411  AST 16  ALT 9  ALKPHOS 59  BILITOT 0.6  PROT 6.7  ALBUMIN 3.8   No results for input(s): LIPASE, AMYLASE in the last 168 hours. No results for input(s): AMMONIA in the last 168 hours. Coagulation profile Recent Labs  Lab 08/16/21 1411  INR 1.4*    CBC: Recent Labs  Lab 08/16/21 1411 08/16/21 2206 08/17/21 0420 08/17/21 0947 08/17/21 1434 08/17/21 2120 08/19/21 0336  WBC 7.4  --  10.1  --   --   --  7.9  NEUTROABS 6.2  --   --   --   --   --  5.5  HGB 7.3*   < > 7.6* 8.5* 8.8* 8.3* 8.1*  HCT 25.3*   < > 25.5* 27.5* 29.2* 27.5* 26.8*  MCV 75.7*  --  75.2*  --   --   --  76.1*  PLT 182  --  181  --   --   --  166   < > = values in this interval not displayed.   Cardiac Enzymes: No results for input(s): CKTOTAL, CKMB, CKMBINDEX, TROPONINI in the last 168 hours. BNP (last 3 results) No results for input(s): PROBNP in the last 8760 hours. CBG: Recent Labs  Lab 08/16/21 1405  GLUCAP 116*   D-Dimer: No results for input(s): DDIMER in the last 72 hours. Hgb A1c: No results for input(s): HGBA1C in the last 72 hours. Lipid Profile: No results for input(s): CHOL, HDL, LDLCALC, TRIG, CHOLHDL, LDLDIRECT in the last 72 hours. Thyroid function studies: No results for input(s): TSH, T4TOTAL, T3FREE, THYROIDAB in the last 72 hours.  Invalid input(s): FREET3 Anemia work up: No results for input(s): VITAMINB12, FOLATE, FERRITIN, TIBC, IRON, RETICCTPCT in the last 72 hours. Sepsis Labs: Recent Labs  Lab 08/16/21 1411  08/17/21 0420 08/19/21 0336  WBC 7.4 10.1 7.9  Microbiology Recent Results (from the past 240 hour(s))  Resp Panel by RT-PCR (Flu A&B, Covid) Nasopharyngeal Swab     Status: None   Collection Time: 08/16/21  6:24 PM   Specimen: Nasopharyngeal Swab; Nasopharyngeal(NP) swabs in vial transport medium  Result Value Ref Range Status   SARS Coronavirus 2 by RT PCR NEGATIVE NEGATIVE Final    Comment: (NOTE) SARS-CoV-2 target nucleic acids are NOT DETECTED.  The SARS-CoV-2 RNA is generally detectable in upper respiratory specimens during the acute phase of infection. The lowest concentration of SARS-CoV-2 viral copies this assay can detect is 138 copies/mL. A negative result does not preclude SARS-Cov-2 infection and should not be used as the sole basis for treatment or other patient management decisions. A negative result may occur with  improper specimen collection/handling, submission of specimen other than nasopharyngeal swab, presence of viral mutation(s) within the areas targeted by this assay, and inadequate number of viral copies(<138 copies/mL). A negative result must be combined with clinical observations, patient history, and epidemiological information. The expected result is Negative.  Fact Sheet for Patients:  BloggerCourse.com  Fact Sheet for Healthcare Providers:  SeriousBroker.it  This test is no t yet approved or cleared by the [place] FDA and  has been authorized for detection and/or diagnosis of SARS-CoV-2 by FDA under an Emergency Use Authorization (EUA). This EUA will remain  in effect (meaning this test can be used) for the duration of the COVID-19 declaration under Section 564(b)(1) of the Act, 21 U.S.C.section 360bbb-3(b)(1), unless the authorization is terminated  or revoked sooner.       Influenza A by PCR NEGATIVE NEGATIVE Final   Influenza B by PCR NEGATIVE NEGATIVE Final    Comment:  (NOTE) The Xpert Xpress SARS-CoV-2/FLU/RSV plus assay is intended as an aid in the diagnosis of influenza from Nasopharyngeal swab specimens and should not be used as a sole basis for treatment. Nasal washings and aspirates are unacceptable for Xpert Xpress SARS-CoV-2/FLU/RSV testing.  Fact Sheet for Patients: BloggerCourse.com  Fact Sheet for Healthcare Providers: SeriousBroker.it  This test is not yet approved or cleared by the [place] FDA and has been authorized for detection and/or diagnosis of SARS-CoV-2 by FDA under an Emergency Use Authorization (EUA). This EUA will remain in effect (meaning this test can be used) for the duration of the COVID-19 declaration under Section 564(b)(1) of the Act, 21 U.S.C. section 360bbb-3(b)(1), unless the authorization is terminated or revoked.  Performed at [facility], [address], [place], [place] [address]   Surgical PCR screen     Status: Abnormal   Collection Time: 08/18/21  2:17 PM   Specimen: Nasal Mucosa; Nasal Swab  Result Value Ref Range Status   MRSA, PCR NEGATIVE NEGATIVE Final   Staphylococcus aureus POSITIVE (A) NEGATIVE Final    Comment: (NOTE) The Xpert SA Assay (FDA approved for NASAL specimens in patients 12 years of age and older), is one component of a comprehensive surveillance program. It is not intended to diagnose infection nor to guide or monitor treatment. Performed at [facility], [address] [place], [place] [address]     Procedures and diagnostic studies:  DG MINI C-ARM IMAGE ONLY  Result Date: 08/19/2021 There is no interpretation for this exam.  This order is for images obtained during a surgical procedure.  Please See "Surgeries" Tab for more information regarding the procedure.               LOS: 3 days  Christle Nolting  [employer] loss adjuster on [email or web address]. If 7PM-7AM, please contact  night-coverage at [email or web address]     08/19/2021, 12:14 PM

## 2021-08-19 NOTE — H&P (Signed)
The patient has been re-examined, and the chart reviewed, and there have been no interval changes to the documented history and physical.    The risks, benefits, and alternatives have been discussed at length with the patient, his wife and son, and the patient is willing to proceed.

## 2021-08-19 NOTE — Care Management Important Message (Signed)
Important Message  Patient Details  Name: [name] MRN: [id number] Date of Birth: January 02, 1941   Medicare Important Message Given:  Yes  Patient out of room for procedure upon time of visit.  No family in room.  Copy of Medicare IM left on patient's bedside tray for reference.    Johnell Comings 08/19/2021, 11:23 AM

## 2021-08-19 NOTE — Op Note (Signed)
08/19/2021  1:24 PM  PATIENT:  [name]    PRE-OPERATIVE DIAGNOSIS:  Left bimalleolar ankle fracture  POST-OPERATIVE DIAGNOSIS:  Same  PROCEDURE:  OPEN REDUCTION INTERNAL FIXATION (ORIF) LEFT BIMALLEOLAR ANKLE FRACTURE  SURGEON:  Thornton Park, MD  ANESTHESIA:   General  PREOPERATIVE INDICATIONS:  [name] is a  80 y.o. male with a diagnosis of displaced left bimalleolar ankle fracture status post fall.  Given the displacement of the fracture it was recommended he undergo open reduction, internal fixation for this fracture given the lateral talar subluxation and instability of the ankle.    I discussed the risks and benefits of surgery. The risks include but are not limited to infection, bleeding requiring blood transfusion, nerve or blood vessel injury, joint stiffness or loss of motion, persistent pain, weakness or instability, malunion, nonunion and hardware failure and the need for further surgery. Medical risks include but are not limited to DVT and pulmonary embolism, myocardial infarction, stroke, pneumonia, respiratory failure and death. Patient understood these risks and wished to proceed.   OPERATIVE IMPLANTS: Synthes 10 hole 1/3 tubular plate with 4.0 cancellous screws x3 and bicortical 3.5 millimeter screws x6  OPERATIVE FINDINGS: Displaced fractures of the lateral malleolus and medial malleolus.  The medial malleolar fracture was comminuted.  OPERATIVE PROCEDURE:   Patient was met in the preoperative area. The left leg was signed my initials and the word yes according the hospital's correct site of surgery protocol.  The patient was brought to the operating room where he underwent general anesthesia. The patient was placed supine on the operative table. A bump was placed under the left hip. A tourniquet was applied to the left thigh.  The lower extremity was prepped and draped in a sterile fashion. A timeout was performed to verify the patient's name, date of  birth, medical record number, correct site of surgery and correct procedure to be performed. It was also used to verify the patient received antibiotics, and that all appropriate instruments, implants and radiographic studies were available in the room. Once all in attendance were in agreement, the case began.  The left lower extremity was exsanguinated with an Esmarch. The tourniquet was inflated to 275 mmHg. This was applied for a total of 80 minutes. A lateral incision was made over the fibula. The subcutaneous tissues were dissected with the Metzenbaum scissor and pickup. Care was taken to avoid injury to the superficial peroneal nerve. The lateral malleolus fracture was identified and irrigated and fracture hematoma was removed. Soft tissue was removed from the fracture site using a periosteal elevator. A fracture reduction clamp was then used to reduce the fracture to an anatomic position.     The lateral malleolus was then drilled in an AP direction, perpendicular to the fracture site to allow for placement of the lag screw.   A single lag screw, 22 mm in length, was advanced across the fracture site by hand. This compressed the fracture.   A 10 hole, 1/3 tubular plate was then contoured and placed along the lateral fibula. Bicortical screws were placed proximal to the fracture and fully threaded cancellus screws were placed distal the fracture. The fracture reduction and hardware placement were confirmed on AP and lateral imaging.  Once the lateral malleolus was plated, the attention was turned to the medial ankle. A small vertical incision was made over the tip of the medial malleolus.  Soft tissue was dissected with some with the Metzenbaum scissor and pickup. The fracture of  the medial malleolus was identified and found to have a vertical split as well as a horizontal fracture line. The medial malleolus was reduced with a dental pick. Two threaded K wires for the 4.0 cannulated screws were then  advanced through the tip of the medial malleolus across the fracture site and into the distal tibia. The position of the K wires was evaluated on AP and lateral FluoroScan images. The length of the wires were measured with a depth gauge and were determined to be about 50 mm in length. The wires were then overdrilled with a cannulated drill for the 4.0 cannulated screws. The two long threaded 4.0 cannulated screws with washers were then advanced into position by hand, compressing the medial malleolus fracture.   A stress test of the right ankle was then performed under fluoroscopy.  This test did not reveal any syndesmotic injury or opening of the medial clear space.  The medial and lateral incisions were then copiously irrigated. The subcutaneous tissue was closed with 2-0 Vicryl and the skin approximated staples. A dry sterile dressing was applied along with an AO splint. The patient's ankle was positioned in neutral. The pateint was then awoken from anesthesia, transferred to hospital bed and brought to the PACU in stable condition. I was scrubbed and present the entire case and all sharp and instrument counts were correct at conclusion the case. I spoke to the patient's family in the post-op consultation room to let them know the case was performed without complication and the patient was stable in recovery room.    Zachary Gaul, MD

## 2021-08-19 NOTE — Transfer of Care (Signed)
Immediate Anesthesia Transfer of Care Note  Patient: [name]  Procedure(s) Performed: OPEN REDUCTION INTERNAL FIXATION (ORIF) ANKLE FRACTURE (Left: Ankle)  Patient Location: PACU  Anesthesia Type:General  Level of Consciousness: drowsy  Airway & Oxygen Therapy: Patient Spontanous Breathing and Patient connected to face mask oxygen  Post-op Assessment: Report given to RN  Post vital signs: stable  Last Vitals:  Vitals Value Taken Time  BP 148/80 08/19/21 1306  Temp    Pulse 56 08/19/21 1310  Resp 20 08/19/21 1310  SpO2 100 % 08/19/21 1310  Vitals shown include unvalidated device data.  Last Pain:  Vitals:   08/19/21 1045  TempSrc: Tympanic  PainSc:          Complications: No notable events documented.

## 2021-08-20 ENCOUNTER — Encounter: Payer: Self-pay | Admitting: Orthopedic Surgery

## 2021-08-20 LAB — CBC WITH DIFFERENTIAL/PLATELET
Abs Immature Granulocytes: 0.05 10*3/uL (ref 0.00–0.07)
Basophils Absolute: 0 10*3/uL (ref 0.0–0.1)
Basophils Relative: 0 %
Eosinophils Absolute: 0 10*3/uL (ref 0.0–0.5)
Eosinophils Relative: 0 %
HCT: 27.5 % — ABNORMAL LOW (ref 39.0–52.0)
Hemoglobin: 8.2 g/dL — ABNORMAL LOW (ref 13.0–17.0)
Immature Granulocytes: 1 %
Lymphocytes Relative: 10 %
Lymphs Abs: 1.1 10*3/uL (ref 0.7–4.0)
MCH: 22.9 pg — ABNORMAL LOW (ref 26.0–34.0)
MCHC: 29.8 g/dL — ABNORMAL LOW (ref 30.0–36.0)
MCV: 76.8 fL — ABNORMAL LOW (ref 80.0–100.0)
Monocytes Absolute: 1.2 10*3/uL — ABNORMAL HIGH (ref 0.1–1.0)
Monocytes Relative: 11 %
Neutro Abs: 8.5 10*3/uL — ABNORMAL HIGH (ref 1.7–7.7)
Neutrophils Relative %: 78 %
Platelets: 170 10*3/uL (ref 150–400)
RBC: 3.58 MIL/uL — ABNORMAL LOW (ref 4.22–5.81)
RDW: 18 % — ABNORMAL HIGH (ref 11.5–15.5)
WBC: 10.8 10*3/uL — ABNORMAL HIGH (ref 4.0–10.5)
nRBC: 0 % (ref 0.0–0.2)

## 2021-08-20 LAB — BASIC METABOLIC PANEL
Anion gap: 5 (ref 5–15)
BUN: 15 mg/dL (ref 8–23)
CO2: 24 mmol/L (ref 22–32)
Calcium: 8.7 mg/dL — ABNORMAL LOW (ref 8.9–10.3)
Chloride: 106 mmol/L (ref 98–111)
Creatinine, Ser: 0.86 mg/dL (ref 0.61–1.24)
GFR, Estimated: >60 mL/min (ref >60)
Glucose, Bld: 120 mg/dL — ABNORMAL HIGH (ref 70–99)
Potassium: 4.1 mmol/L (ref 3.5–5.1)
Sodium: 135 mmol/L (ref 135–145)

## 2021-08-20 MED ORDER — APIXABAN 5 MG PO TABS
5.0000 mg | ORAL_TABLET | Freq: Two times a day (BID) | ORAL | Status: DC
  Administered 2021-08-20 – 2021-08-23 (×6): 5 mg via ORAL
  Filled 2021-08-20 (×6): qty 1

## 2021-08-20 NOTE — Progress Notes (Signed)
Subjective:  POD #1 s/p ORIF of left ankle.   Patient reports ankle pain as mild currently.  Patient had significant pain earlier.  Patient states she was evaluated by physical therapy earlier today.  He is currently in bed with his left lower extremity elevated.  His family is at the bedside.  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  This is consistent with his baseline. His toes a well-perfused. Patient can extend extend his toes  Incision: dressing C/D/I Compartment soft  LABS  Results for orders placed or performed during the hospital encounter of 08/16/21 (from the past 24 hour(s))  CBC with Differential/Platelet     Status: Abnormal   Collection Time: 08/20/21  5:51 AM  Result Value Ref Range   WBC 10.8 (H) 4.0 - 10.5 K/uL   RBC 3.58 (L) 4.22 - 5.81 MIL/uL   Hemoglobin 8.2 (L) 13.0 - 17.0 g/dL   HCT 08/22/21 (L) 79.8 - 92.1 %   MCV 76.8 (L) 80.0 - 100.0 fL   MCH 22.9 (L) 26.0 - 34.0 pg   MCHC 29.8 (L) 30.0 - 36.0 g/dL   RDW 19.4 (H) 17.4 - 08.1 %   Platelets 170 150 - 400 K/uL   nRBC 0.0 0.0 - 0.2 %   Neutrophils Relative % 78 %   Neutro Abs 8.5 (H) 1.7 - 7.7 K/uL   Lymphocytes Relative 10 %   Lymphs Abs 1.1 0.7 - 4.0 K/uL   Monocytes Relative 11 %   Monocytes Absolute 1.2 (H) 0.1 - 1.0 K/uL   Eosinophils Relative 0 %   Eosinophils Absolute 0.0 0.0 - 0.5 K/uL   Basophils Relative 0 %   Basophils Absolute 0.0 0.0 - 0.1 K/uL   Immature Granulocytes 1 %   Abs Immature Granulocytes 0.05 0.00 - 0.07 K/uL  Basic metabolic panel     Status: Abnormal   Collection  Time: 08/20/21  5:51 AM  Result Value Ref Range   Sodium 135 135 - 145 mmol/L   Potassium 4.1 3.5 - 5.1 mmol/L   Chloride 106 98 - 111 mmol/L   CO2 24 22 - 32 mmol/L   Glucose, Bld 120 (H) 70 - 99 mg/dL   BUN 15 8 - 23 mg/dL   Creatinine, Ser 08/22/21 0.61 - 1.24 mg/dL   Calcium 8.7 (L) 8.9 - 10.3 mg/dL   GFR, Estimated 1.85 >63 mL/min   Anion gap 5 5 - 15    DG Ankle Complete Left  Result Date: 08/19/2021 CLINICAL DATA:  Postoperative, ORIF EXAM: LEFT ANKLE COMPLETE - 3+ VIEW COMPARISON:  08/16/2021 FINDINGS: Status post plate and screw fixation of the distal left fibula and screw fixation of the medial malleolus with anatomic alignment of fractures. Soft tissue edema about the foot and ankle. Cast material about the foot and ankle. IMPRESSION: Status post plate and screw fixation of the distal left fibula and screw fixation of the medial malleolus with anatomic alignment  of fractures. Electronically Signed   By: Jearld Lesch M.D.   On: 08/19/2021 13:52   DG MINI C-ARM IMAGE ONLY  Result Date: 08/19/2021 There is no interpretation for this exam.  This order is for images obtained during a surgical procedure.  Please See "Surgeries" Tab for more information regarding the procedure.    Assessment/Plan: 1 Day Post-Op   Principal Problem:   Closed left ankle fracture Active Problems:   GI bleeding  Patient stable postop.  Patient was encouraged to communicate with the nursing staff when he has significant pain.  Continue with physical therapy.  Continue to elevate the left lower extremity whenever is not working with physical therapy.  Patient may apply ice to the left ankle.Marland Kitchen    Juanell Fairly , MD 08/20/2021, 11:39 AM

## 2021-08-20 NOTE — Progress Notes (Signed)
Progress Note    [name]  HQR:[id number] DOB: 20-Jun-1941  DOA: 08/16/2021 PCP: Jerl Mina, MD      Brief Narrative:    Medical records reviewed and are as summarized below:  [name] is a 80 y.o. male with medical history significant for left lower extremity DVT in 2011, subarachnoid hemorrhage in 2010, hypertension, dyslipidemia, migraine, chronic atrial fibrillation, chronic low back pain, who presented to the hospital because of syncope leading to a fall and left ankle injury.      Assessment/Plan:   Principal Problem:   Closed left ankle fracture Active Problems:   GI bleeding   Body mass index is 30.92 kg/m.  (Obesity)  Left ankle fracture: S/p ORIF left bimalleolar ankle fracture.  Continue analgesics as needed for pain.  Follow-up with orthopedic surgeon.  PT and OT.  Acute blood loss anemia: H&H is stable.  S/p transfusion with 2 units of PRBCs.   Positive heme stool: No melena or hematemesis. Continue Protonix.     S/p syncope: This was probably from anemia  Other comorbidities include hypertension, dyslipidemia, vitamin B12 deficiency, atrial fibrillation on Eliquis    Diet Order             Diet regular Room service appropriate? Yes; Fluid consistency: Thin  Diet effective now                      Consultants: Environmental manager  Procedures: None    Medications:    acetaminophen  500 mg Oral Q6H   aliskiren  300 mg Oral Daily   apixaban  5 mg Oral BID   carbamazepine  200 mg Oral BID   docusate sodium  100 mg Oral BID   doxazosin  1 mg Oral QHS   irbesartan  300 mg Oral Daily   mupirocin ointment  1 application Nasal BID   omega-3 acid ethyl esters  1 g Oral Daily   pantoprazole  40 mg Intravenous Q12H   senna  1 tablet Oral BID   simvastatin  20 mg Oral BID   traMADol  50 mg Oral Q6H   vitamin B-12  1,000 mcg Oral Daily   Continuous Infusions:  methocarbamol (ROBAXIN) IV      promethazine (PHENERGAN) injection (IM or IVPB) Stopped (08/16/21 1903)     Anti-infectives (From admission, onward)    Start     Dose/Rate Route Frequency Ordered Stop   08/19/21 1700  ceFAZolin (ANCEF) IVPB 2g/100 mL premix        2 g 200 mL/hr over 30 Minutes Intravenous Every 6 hours 08/19/21 1445 08/20/21 0013   08/19/21 1038  ceFAZolin (ANCEF) 2-4 GM/100ML-% IVPB       Note to Pharmacy: Desma Paganini   : cabinet override      08/19/21 1038 08/20/21 0013   08/19/21 1033  ceFAZolin (ANCEF) IVPB 2g/100 mL premix        2 g 200 mL/hr over 30 Minutes Intravenous 30 min pre-op 08/19/21 1033 08/19/21 1122              Family Communication/Anticipated D/C date and plan/Code Status   DVT prophylaxis: SCDs Start: 08/19/21 1446 Place TED hose Start: 08/19/21 1446 Place and maintain sequential compression device Start: 08/16/21 2032 apixaban (ELIQUIS) tablet 5 mg     Code Status: Full Code  Family Communication: Plan discussed with his wife and son at the bedside  Disposition Plan: Discharge to home versus  SNF in 2 to 3 days   Status is: Inpatient  Remains inpatient appropriate because: Left ankle fracture awaiting surgery           Subjective:   C/o pain in the left lower extremity.  No rectal bleeding.  His wife and son were at the bedside  Objective:    Vitals:   08/19/21 2007 08/20/21 0342 08/20/21 0717 08/20/21 1500  BP: (!) 172/93 (!) 141/66 135/80 119/65  Pulse: 67 78 79 66  Resp: 17 20 18 18   Temp: 98.1 F (36.7 C) 97.6 F (36.4 C) 97.9 F (36.6 C) 97.9 F (36.6 C)  TempSrc:  Oral    SpO2: 95% 96% 97% 93%  Weight:      Height:       No data found.   Intake/Output Summary (Last 24 hours) at 08/20/2021 1614 Last data filed at 08/19/2021 1842 Gross per 24 hour  Intake 240 ml  Output --  Net 240 ml   Filed Weights   08/16/21 1319 08/17/21 0300  Weight: 101 kg 103.4 kg    Exam:  GEN: NAD SKIN: No rash EYES: EOMI ENT: MMM CV:  RRR PULM: CTA B ABD: soft, ND, NT, +BS CNS: AAO x 3, non focal EXT: Dressing on the left leg/foot is clean, dry and intact          Data Reviewed:   I have personally reviewed following labs and imaging studies:  Labs: Labs show the following:   Basic Metabolic Panel: Recent Labs  Lab 08/16/21 1411 08/17/21 0420 08/20/21 0551  NA 136 137 135  K 3.9 4.7 4.1  CL 105 106 106  CO2 26 27 24   GLUCOSE 120* 86 120*  BUN 23 19 15   CREATININE 1.25* 0.99 0.86  CALCIUM 8.8* 8.7* 8.7*   GFR Estimated Creatinine Clearance: 83.8 mL/min (by C-G formula based on SCr of 0.86 mg/dL). Liver Function Tests: Recent Labs  Lab 08/16/21 1411  AST 16  ALT 9  ALKPHOS 59  BILITOT 0.6  PROT 6.7  ALBUMIN 3.8   No results for input(s): LIPASE, AMYLASE in the last 168 hours. No results for input(s): AMMONIA in the last 168 hours. Coagulation profile Recent Labs  Lab 08/16/21 1411  INR 1.4*    CBC: Recent Labs  Lab 08/16/21 1411 08/16/21 2206 08/17/21 0420 08/17/21 0947 08/17/21 1434 08/17/21 2120 08/19/21 0336 08/20/21 0551  WBC 7.4  --  10.1  --   --   --  7.9 10.8*  NEUTROABS 6.2  --   --   --   --   --  5.5 8.5*  HGB 7.3*   < > 7.6* 8.5* 8.8* 8.3* 8.1* 8.2*  HCT 25.3*   < > 25.5* 27.5* 29.2* 27.5* 26.8* 27.5*  MCV 75.7*  --  75.2*  --   --   --  76.1* 76.8*  PLT 182  --  181  --   --   --  166 170   < > = values in this interval not displayed.   Cardiac Enzymes: No results for input(s): CKTOTAL, CKMB, CKMBINDEX, TROPONINI in the last 168 hours. BNP (last 3 results) No results for input(s): PROBNP in the last 8760 hours. CBG: Recent Labs  Lab 08/16/21 1405  GLUCAP 116*   D-Dimer: No results for input(s): DDIMER in the last 72 hours. Hgb A1c: No results for input(s): HGBA1C in the last 72 hours. Lipid Profile: No results for input(s): CHOL, HDL, LDLCALC, TRIG, CHOLHDL, LDLDIRECT  in the last 72 hours. Thyroid function studies: No results for input(s): TSH,  T4TOTAL, T3FREE, THYROIDAB in the last 72 hours.  Invalid input(s): FREET3 Anemia work up: No results for input(s): VITAMINB12, FOLATE, FERRITIN, TIBC, IRON, RETICCTPCT in the last 72 hours. Sepsis Labs: Recent Labs  Lab 08/16/21 1411 08/17/21 0420 08/19/21 0336 08/20/21 0551  WBC 7.4 10.1 7.9 10.8*    Microbiology Recent Results (from the past 240 hour(s))  Resp Panel by RT-PCR (Flu A&B, Covid) Nasopharyngeal Swab     Status: None   Collection Time: 08/16/21  6:24 PM   Specimen: Nasopharyngeal Swab; Nasopharyngeal(NP) swabs in vial transport medium  Result Value Ref Range Status   SARS Coronavirus 2 by RT PCR NEGATIVE NEGATIVE Final    Comment: (NOTE) SARS-CoV-2 target nucleic acids are NOT DETECTED.  The SARS-CoV-2 RNA is generally detectable in upper respiratory specimens during the acute phase of infection. The lowest concentration of SARS-CoV-2 viral copies this assay can detect is 138 copies/mL. A negative result does not preclude SARS-Cov-2 infection and should not be used as the sole basis for treatment or other patient management decisions. A negative result may occur with  improper specimen collection/handling, submission of specimen other than nasopharyngeal swab, presence of viral mutation(s) within the areas targeted by this assay, and inadequate number of viral copies(<138 copies/mL). A negative result must be combined with clinical observations, patient history, and epidemiological information. The expected result is Negative.  Fact Sheet for Patients:  BloggerCourse.com  Fact Sheet for Healthcare Providers:  SeriousBroker.it  This test is no t yet approved or cleared by the [place] FDA and  has been authorized for detection and/or diagnosis of SARS-CoV-2 by FDA under an Emergency Use Authorization (EUA). This EUA will remain  in effect (meaning this test can be used) for the duration of the COVID-19  declaration under Section 564(b)(1) of the Act, 21 U.S.C.section 360bbb-3(b)(1), unless the authorization is terminated  or revoked sooner.       Influenza A by PCR NEGATIVE NEGATIVE Final   Influenza B by PCR NEGATIVE NEGATIVE Final    Comment: (NOTE) The Xpert Xpress SARS-CoV-2/FLU/RSV plus assay is intended as an aid in the diagnosis of influenza from Nasopharyngeal swab specimens and should not be used as a sole basis for treatment. Nasal washings and aspirates are unacceptable for Xpert Xpress SARS-CoV-2/FLU/RSV testing.  Fact Sheet for Patients: BloggerCourse.com  Fact Sheet for Healthcare Providers: SeriousBroker.it  This test is not yet approved or cleared by the [place] FDA and has been authorized for detection and/or diagnosis of SARS-CoV-2 by FDA under an Emergency Use Authorization (EUA). This EUA will remain in effect (meaning this test can be used) for the duration of the COVID-19 declaration under Section 564(b)(1) of the Act, 21 U.S.C. section 360bbb-3(b)(1), unless the authorization is terminated or revoked.  Performed at [facility], [address] [place], [place] [address]   Surgical PCR screen     Status: Abnormal   Collection Time: 08/18/21  2:17 PM   Specimen: Nasal Mucosa; Nasal Swab  Result Value Ref Range Status   MRSA, PCR NEGATIVE NEGATIVE Final   Staphylococcus aureus POSITIVE (A) NEGATIVE Final    Comment: (NOTE) The Xpert SA Assay (FDA approved for NASAL specimens in patients 67 years of age and older), is one component of a comprehensive surveillance program. It is not intended to diagnose infection nor to guide or monitor treatment. Performed at [facility], [address] [place], [place]  [address]     Procedures and diagnostic studies:  DG Ankle Complete Left  Result Date: 08/19/2021 CLINICAL DATA:  Postoperative, ORIF EXAM: LEFT ANKLE COMPLETE - 3+  VIEW COMPARISON:  08/16/2021 FINDINGS: Status post plate and screw fixation of the distal left fibula and screw fixation of the medial malleolus with anatomic alignment of fractures. Soft tissue edema about the foot and ankle. Cast material about the foot and ankle. IMPRESSION: Status post plate and screw fixation of the distal left fibula and screw fixation of the medial malleolus with anatomic alignment of fractures. Electronically Signed   By: Jearld Lesch M.D.   On: 08/19/2021 13:52   DG MINI C-ARM IMAGE ONLY  Result Date: 08/19/2021 There is no interpretation for this exam.  This order is for images obtained during a surgical procedure.  Please See "Surgeries" Tab for more information regarding the procedure.               LOS: 4 days   Dmarcus Decicco  [facility]   Pager on [email or web address]. If 7PM-7AM, please contact night-coverage at [email or web address]     08/20/2021, 4:14 PM

## 2021-08-20 NOTE — Evaluation (Signed)
Physical Therapy Evaluation Patient Details Name: [name] MRN: [id number] DOB: [birth year] Today's Date: 08/20/2021  History of Present Illness  [name] is a 80 y.o. Caucasian male with medical history significant for hypertension, dyslipidemia, migraines, chronic atrial fibrillation, and chronic low back pain, who presented to the ER on 08/16/2021 with acute onset of fall with associated syncope and left ankle injury and subsequent pain.  The patient was walking back from her bathroom and suddenly experienced lightheadedness and diaphoresis then passed out falling to the floor. He was admitted forGI bleeding with acute blood loss anemia,  syncope secondary to symptomatic anemia with subsequent fall and left ankle fracture. He had blood transfusion. He has Afib.  On 08/19/2021 he underwent  OPEN REDUCTION INTERNAL FIXATION (ORIF) LEFT BIMALLEOLAR ANKLE FRACTURE. He is NWB on L LE.   Clinical Impression  Patient received reclining in bed with wife at bedside. He was awake and oriented and able to provide a detailed history. He lives with his wife in a home with main level and basement and 2 steps to enter with one handrail. Prior to hospitalization he ambulated mod I with RW and was I with ADLs. His wife helped with IADLs. Patient has had one fall in the last 6 months that resulted in current L ankle fracture. Upon PT eval, patient was able to perform bed mobility with supervision but was unable to safely transfer to another surface using RW while maintaining L LE NWB status despite mod A +2. PT co-evaluated with OT to improve patient safety and maintain weightbearing precutions. Patient has R foot drop and insufficient UE strength that contributed to his inability to advance R foot without putting weight through L LE. Patient has experienced a significant decline in functional mobility/independence and is currently not safe to return home at his current functional ability due to being  unable to safely transfer or ambulate. He would benefit from short term rehab prior to returning home. Patient would benefit from skilled physical therapy to address impairments and functional limitations (see PT Problem List below) to work towards stated goals and return to PLOF or maximal functional independence.        Recommendations for follow up therapy are one component of a multi-disciplinary discharge planning process, led by the attending physician.  Recommendations may be updated based on patient status, additional functional criteria and insurance authorization.  Follow Up Recommendations Skilled nursing-short term rehab (<3 hours/day)    Assistance Recommended at Discharge Frequent or constant Supervision/Assistance  Functional Status Assessment Patient has had a recent decline in their functional status and demonstrates the ability to make significant improvements in function in a reasonable and predictable amount of time.  Equipment Recommendations  BSC/3in1;Wheelchair (measurements PT)    Recommendations for Other Services OT consult     Precautions / Restrictions Restrictions Weight Bearing Restrictions: Yes LLE Weight Bearing: Non weight bearing      Mobility  Bed Mobility Overal bed mobility: Needs Assistance Bed Mobility: Supine to Sit;Sit to Supine     Supine to sit: Supervision Sit to supine: Supervision   General bed mobility comments: Patient able to perform supine <> sit with supervision and cuing for body placement to maintain WB precautions.    Transfers Overall transfer level: Needs assistance Equipment used: Rolling walker (2 wheels) Transfers: Sit to/from Stand Sit to Stand: Mod assist;+2 physical assistance           General transfer comment: Patient required mod A +2 to perform sit <>  stand to RW and edge of bed. Patient unable to maintain L LE NWB with cuing. When clinician manually held L LE to enforce weight bearing precautions, they  had to offload entire weight of leg.    Ambulation/Gait Ambulation/Gait assistance: +2 physical assistance;Mod assist Gait Distance (Feet): 1 Feet Assistive device: Rolling walker (2 wheels) Gait Pattern/deviations:  (hop/foot slide)       General Gait Details: Patient unable to hop and was unable to advance R foot forward safely without breaking weightbearing precautions and required mod A +2 to prevent falls or weightbearing through L LE during attempt.  Stairs            Wheelchair Mobility    Modified Rankin (Stroke Patients Only)       Balance Overall balance assessment: Needs assistance Sitting-balance support: Feet supported Sitting balance-Leahy Scale: Good Sitting balance - Comments: Able to reach towards to about 3 inches short of right toe   Standing balance support: Single extremity supported;Bilateral upper extremity supported Standing balance-Leahy Scale: Poor Standing balance comment: dependent on RW for balance                             Pertinent Vitals/Pain Pain Assessment: 0-10 Pain Score: 1  Pain Location: left ankle when accidently puts pressure on it Pain Intervention(s): Limited activity within patient's tolerance;Monitored during session;Repositioned    Home Living Family/patient expects to be discharged to:: Private residence Living Arrangements: Spouse/significant other Available Help at Discharge: Available 24 hours/day (except to get groceries) Type of Home: House Home Access: Stairs to enter Entrance Stairs-Rails: Right Entrance Stairs-Number of Steps: 2   Home Layout: Laundry or work area in basement;Able to live on main level with bedroom/bathroom Home Equipment: Agricultural consultant (2 wheels);Cane - single point;Grab bars - tub/shower;Grab bars - toilet;Shower seat - built in      Prior Function Prior Level of Function : Independent/Modified Independent             Mobility Comments: Patient reports that up until  last year he used a SPC to get around. Last year he started using a RW for mobility. ADLs Comments: Wife does cooking and cleaning. Prior to hospitalization patient dresses himself. He has had one fall in the last 6 months. He also fell in 2021. Wears glassess constantly.     Hand Dominance   Dominant Hand: Right    Extremity/Trunk Assessment   Upper Extremity Assessment Upper Extremity Assessment: Defer to OT evaluation    Lower Extremity Assessment Lower Extremity Assessment: LLE deficits/detail;RLE deficits/detail RLE Deficits / Details: MMT: R ankle dorsiflexion: 0-1/5, R Knee extension 4+/5. Pateint with foot drop (states he has not used an AFO for foot drop before). LLE Deficits / Details: NWB. L knee extension at least 3/5. LLE: Unable to fully assess due to immobilization    Cervical / Trunk Assessment Cervical / Trunk Assessment: Normal  Communication   Communication: No difficulties  Cognition Arousal/Alertness: Awake/alert Behavior During Therapy: WFL for tasks assessed/performed Overall Cognitive Status: Within Functional Limits for tasks assessed                                          General Comments      Exercises Other Exercises Other Exercises: educated on D/C reccomendations, safe body position, use of AD, and weight bearing precautions.  Assessment/Plan    PT Assessment Patient needs continued PT services  PT Problem List Decreased strength;Decreased range of motion;Pain;Decreased activity tolerance;Decreased knowledge of use of DME;Decreased balance;Decreased safety awareness;Decreased mobility;Decreased knowledge of precautions;Decreased skin integrity       PT Treatment Interventions DME instruction;Balance training;Gait training;Neuromuscular re-education;Stair training;Functional mobility training;Patient/family education;Therapeutic activities;Therapeutic exercise    PT Goals (Current goals can be found in the Care Plan  section)  Acute Rehab PT Goals Patient Stated Goal: to return home PT Goal Formulation: With patient/family Time For Goal Achievement: 09/03/21 Potential to Achieve Goals: Fair    Frequency 7X/week   Barriers to discharge Other (comment) patient unable to transfer or ambulate safely while maintaining WB precautions.    Co-evaluation PT/OT/SLP Co-Evaluation/Treatment: Yes Reason for Co-Treatment: For patient/therapist safety;To address functional/ADL transfers;Complexity of the patient's impairments (multi-system involvement) PT goals addressed during session: Mobility/safety with mobility;Balance;Proper use of DME;Strengthening/ROM         AM-PAC PT "6 Clicks" Mobility  Outcome Measure Help needed turning from your back to your side while in a flat bed without using bedrails?: None Help needed moving from lying on your back to sitting on the side of a flat bed without using bedrails?: None Help needed moving to and from a bed to a chair (including a wheelchair)?: A Lot Help needed standing up from a chair using your arms (e.g., wheelchair or bedside chair)?: A Lot Help needed to walk in hospital room?: A Lot Help needed climbing 3-5 steps with a railing? : Total 6 Click Score: 15    End of Session Equipment Utilized During Treatment: Gait belt (RW)   Patient left: in bed;with call bell/phone within reach;with bed alarm set;with family/visitor present Nurse Communication: Mobility status PT Visit Diagnosis: Unsteadiness on feet (R26.81);History of falling (Z91.81);Difficulty in walking, not elsewhere classified (R26.2);Pain;Muscle weakness (generalized) (M62.81) Pain - Right/Left: Left Pain - part of body: Ankle and joints of foot    Time: 4944-9675 PT Time Calculation (min) (ACUTE ONLY): 26 min   Charges:   PT Evaluation $PT Eval Moderate Complexity: 1 Mod          Rohn Fritsch R. Ilsa Iha, PT, DPT 08/20/21, 9:31 AM

## 2021-08-20 NOTE — Evaluation (Signed)
Occupational Therapy Evaluation Patient Details Name: [name] MRN: [id number] DOB: 1941-06-03 Today's Date: 08/20/2021   History of Present Illness [name] is a 80 y.o. Caucasian male with medical history significant for hypertension, dyslipidemia, migraines, chronic atrial fibrillation, and chronic low back pain, who presented to the ER on 08/16/2021 with acute onset of fall with associated syncope and left ankle injury and subsequent pain.  The patient was walking back from her bathroom and suddenly experienced lightheadedness and diaphoresis then passed out falling to the floor. He was admitted forGI bleeding with acute blood loss anemia,  syncope secondary to symptomatic anemia with subsequent fall and left ankle fracture. He had blood transfusion. He has Afib.  On 08/19/2021 he underwent  OPEN REDUCTION INTERNAL FIXATION (ORIF) LEFT BIMALLEOLAR ANKLE FRACTURE. He is NWB on L LE.   Clinical Impression   Patient presenting with decreased Ind in self care,balance, functional mobility/transfers, endurance, and safety awareness. Patient reports living at home with wife and using RW at baseline. His wife assists with IADLS and he reports being able to perform self care tasks independently. Pt verbalized precautions to pt in regards to NWB through L LE. Pt needing mod A of 2 to come into stand from standard height bed. Pt is unable to take step or even stand without bearing weight through L LE. OT manually holding L LE for pt to make hop but he was unable. Patient will benefit from acute OT to increase overall independence in the areas of ADLs, functional mobility, and safety awareness in order to safely discharge to next venue of care.      Recommendations for follow up therapy are one component of a multi-disciplinary discharge planning process, led by the attending physician.  Recommendations may be updated based on patient status, additional functional criteria and insurance  authorization.   Follow Up Recommendations  Skilled nursing-short term rehab (<3 hours/day)    Assistance Recommended at Discharge Frequent or constant Supervision/Assistance  Functional Status Assessment  Patient has had a recent decline in their functional status and demonstrates the ability to make significant improvements in function in a reasonable and predictable amount of time.  Equipment Recommendations  Other (comment) (defer to next venue of care)       Precautions / Restrictions Precautions Precautions: Fall Restrictions Weight Bearing Restrictions: Yes LLE Weight Bearing: Non weight bearing      Mobility Bed Mobility Overal bed mobility: Needs Assistance Bed Mobility: Supine to Sit;Sit to Supine     Supine to sit: Supervision Sit to supine: Supervision   General bed mobility comments: Patient able to perform supine <> sit with supervision and cuing for body placement to maintain WB precautions.    Transfers Overall transfer level: Needs assistance Equipment used: Rolling walker (2 wheels) Transfers: Sit to/from Stand Sit to Stand: Mod assist;+2 physical assistance           General transfer comment: Patient required mod A +2 to perform sit <> stand to RW and edge of bed. Patient unable to maintain L LE NWB with cuing. When clinician manually held L LE to enforce weight bearing precautions, they had to offload entire weight of leg.      Balance Overall balance assessment: Needs assistance Sitting-balance support: Feet supported Sitting balance-Leahy Scale: Good Sitting balance - Comments: Able to reach towards to about 3 inches short of right toe   Standing balance support: Single extremity supported;Bilateral upper extremity supported;Reliant on assistive device for balance Standing balance-Leahy Scale: Poor  Standing balance comment: dependent on RW for balance                           ADL either performed or assessed with clinical  judgement   ADL Overall ADL's : Needs assistance/impaired     Grooming: Set up;Wash/dry hands               Lower Body Dressing: Total assistance;Sitting/lateral leans     Toilet Transfer Details (indicate cue type and reason): unable to safely transfer this session                 Vision Patient Visual Report: No change from baseline              Pertinent Vitals/Pain Pain Assessment: 0-10 Pain Score: 1  Pain Location: left ankle when accidently puts pressure on it Pain Descriptors / Indicators: Discomfort Pain Intervention(s): Limited activity within patient's tolerance;Monitored during session;Repositioned     Hand Dominance Right   Extremity/Trunk Assessment Upper Extremity Assessment Upper Extremity Assessment: Overall WFL for tasks assessed   Lower Extremity Assessment Lower Extremity Assessment: Defer to PT evaluation RLE Deficits / Details: MMT: R ankle dorsiflexion: 0-1/5, R Knee extension 4+/5. Pateint with foot drop (states he has not used an AFO for foot drop before). LLE Deficits / Details: NWB. L knee extension at least 3/5. LLE: Unable to fully assess due to immobilization   Cervical / Trunk Assessment Cervical / Trunk Assessment: Normal   Communication Communication Communication: No difficulties   Cognition Arousal/Alertness: Awake/alert Behavior During Therapy: WFL for tasks assessed/performed Overall Cognitive Status: Within Functional Limits for tasks assessed                                          Exercises Other Exercises Other Exercises: educated on D/C reccomendations, safe body position, use of AD, and weight bearing precautions.        Home Living Family/patient expects to be discharged to:: Private residence Living Arrangements: Spouse/significant other Available Help at Discharge: Available 24 hours/day Type of Home: House Home Access: Stairs to enter [employer] of Steps: 2 Entrance  Stairs-Rails: Right Home Layout: Laundry or work area in basement;Able to live on main level with bedroom/bathroom     Bathroom Shower/Tub: Walk-in Human resources officer: Standard     Home Equipment: Agricultural consultant (2 wheels);Cane - single point;Grab bars - tub/shower;Grab bars - toilet;Shower seat - built in          Prior Functioning/Environment Prior Level of Function : Independent/Modified Independent             Mobility Comments: Patient reports that up until last year he used a SPC to get around. Last year he started using a RW for mobility. ADLs Comments: Wife does cooking and cleaning. Prior to hospitalization patient dresses himself. He has had one fall in the last 6 months. He also fell in 2021. Wears glassess constantly.        OT Problem List: Decreased strength;Decreased activity tolerance;Impaired balance (sitting and/or standing);Decreased knowledge of precautions;Decreased knowledge of use of DME or AE;Decreased safety awareness      OT Treatment/Interventions: Self-care/ADL training;Therapeutic exercise;Therapeutic activities;Energy conservation;DME and/or AE instruction;Manual therapy;Balance training;Patient/family education    OT Goals(Current goals can be found in the care plan section) Acute Rehab OT Goals Patient Stated Goal: to  go home OT Goal Formulation: With patient/family Time For Goal Achievement: 09/03/21 Potential to Achieve Goals: Fair ADL Goals Pt Will Perform Grooming: standing;with min assist Pt Will Perform Lower Body Dressing: with min assist;sitting/lateral leans Pt Will Transfer to Toilet: with min assist Pt Will Perform Toileting - Clothing Manipulation and hygiene: with min assist  OT Frequency: Min 2X/week   Barriers to D/C: Other (comment)  Wife is unable to provide this level of assistance. Pt has two steps to enter home but is unable to stand and maintain wt bearing precautions.       Co-evaluation PT/OT/SLP  Co-Evaluation/Treatment: Yes Reason for Co-Treatment: For patient/therapist safety;To address functional/ADL transfers PT goals addressed during session: Mobility/safety with mobility;Balance;Proper use of DME;Strengthening/ROM OT goals addressed during session: ADL's and self-care      AM-PAC OT "6 Clicks" Daily Activity     Outcome Measure Help from another person eating meals?: None Help from another person taking care of personal grooming?: None Help from another person toileting, which includes using toliet, bedpan, or urinal?: Total Help from another person bathing (including washing, rinsing, drying)?: A Lot Help from another person to put on and taking off regular upper body clothing?: A Little Help from another person to put on and taking off regular lower body clothing?: Total 6 Click Score: 15   End of Session Equipment Utilized During Treatment: Rolling walker (2 wheels) Nurse Communication: Mobility status  Activity Tolerance: Patient limited by fatigue Patient left: in bed;with call bell/phone within reach;with bed alarm set;with family/visitor present  OT Visit Diagnosis: Unsteadiness on feet (R26.81);Repeated falls (R29.6);Muscle weakness (generalized) (M62.81)                Time: 2440-1027 OT Time Calculation (min): 26 min Charges:  OT General Charges $OT Visit: 1 Visit OT Evaluation $OT Eval Moderate Complexity: [address], [place], OTR/L , CBIS ascom [phone]  08/20/21, 9:44 AM

## 2021-08-21 LAB — BASIC METABOLIC PANEL
Anion gap: 6 (ref 5–15)
BUN: 17 mg/dL (ref 8–23)
CO2: 23 mmol/L (ref 22–32)
Calcium: 8.8 mg/dL — ABNORMAL LOW (ref 8.9–10.3)
Chloride: 106 mmol/L (ref 98–111)
Creatinine, Ser: 0.89 mg/dL (ref 0.61–1.24)
GFR, Estimated: >60 mL/min (ref >60)
Glucose, Bld: 109 mg/dL — ABNORMAL HIGH (ref 70–99)
Potassium: 4.1 mmol/L (ref 3.5–5.1)
Sodium: 135 mmol/L (ref 135–145)

## 2021-08-21 LAB — CBC WITH DIFFERENTIAL/PLATELET
Abs Immature Granulocytes: 0.04 10*3/uL (ref 0.00–0.07)
Basophils Absolute: 0 10*3/uL (ref 0.0–0.1)
Basophils Relative: 0 %
Eosinophils Absolute: 0.1 10*3/uL (ref 0.0–0.5)
Eosinophils Relative: 1 %
HCT: 28.6 % — ABNORMAL LOW (ref 39.0–52.0)
Hemoglobin: 8.7 g/dL — ABNORMAL LOW (ref 13.0–17.0)
Immature Granulocytes: 0 %
Lymphocytes Relative: 15 %
Lymphs Abs: 1.5 10*3/uL (ref 0.7–4.0)
MCH: 23.6 pg — ABNORMAL LOW (ref 26.0–34.0)
MCHC: 30.4 g/dL (ref 30.0–36.0)
MCV: 77.7 fL — ABNORMAL LOW (ref 80.0–100.0)
Monocytes Absolute: 1.2 10*3/uL — ABNORMAL HIGH (ref 0.1–1.0)
Monocytes Relative: 12 %
Neutro Abs: 7.1 10*3/uL (ref 1.7–7.7)
Neutrophils Relative %: 72 %
Platelets: 168 10*3/uL (ref 150–400)
RBC: 3.68 MIL/uL — ABNORMAL LOW (ref 4.22–5.81)
RDW: 18.8 % — ABNORMAL HIGH (ref 11.5–15.5)
WBC: 9.9 10*3/uL (ref 4.0–10.5)
nRBC: 0 % (ref 0.0–0.2)

## 2021-08-21 MED ORDER — CYCLOBENZAPRINE HCL 10 MG PO TABS
5.0000 mg | ORAL_TABLET | Freq: Three times a day (TID) | ORAL | Status: DC | PRN
  Administered 2021-08-21 – 2021-08-23 (×4): 5 mg via ORAL
  Filled 2021-08-21 (×4): qty 1

## 2021-08-21 MED ORDER — PANTOPRAZOLE SODIUM 40 MG PO TBEC
40.0000 mg | DELAYED_RELEASE_TABLET | Freq: Two times a day (BID) | ORAL | Status: DC
  Administered 2021-08-21 – 2021-08-23 (×4): 40 mg via ORAL
  Filled 2021-08-21 (×4): qty 1

## 2021-08-21 NOTE — Progress Notes (Addendum)
Subjective:  POD #2 s/p ORIF of left bimalleolar ankle fracture.   Patient reports left leg pain as moderate.  Patient reports having spasms today in the left lower leg.  Patient is up out of bed to a chair.  Extremity at the bedside.  Objective:   VITALS:   Vitals:   08/20/21 2132 08/21/21 0220 08/21/21 0730 08/21/21 1121  BP: (!) 145/81 125/65 116/66 140/74  Pulse: 68 94 85 80  Resp: 18 16 16 18   Temp: 99.3 F (37.4 C) 98.7 F (37.1 C) 98.2 F (36.8 C) 98.9 F (37.2 C)  TempSrc:   Oral Oral  SpO2: 94% 95% 97% 96%  Weight:      Height:        PHYSICAL EXAM: Left lower extremity Neurovascular intact Sensation intact distally Intact pulses distally Dorsiflexion/Plantar flexion intact Incision: dressing C/D/I No cellulitis present Compartment soft  LABS  Results for orders placed or performed during the hospital encounter of 08/16/21 (from the past 24 hour(s))  Basic metabolic panel     Status: Abnormal   Collection Time: 08/21/21  5:12 AM  Result Value Ref Range   Sodium 135 135 - 145 mmol/L   Potassium 4.1 3.5 - 5.1 mmol/L   Chloride 106 98 - 111 mmol/L   CO2 23 22 - 32 mmol/L   Glucose, Bld 109 (H) 70 - 99 mg/dL   BUN 17 8 - 23 mg/dL   Creatinine, Ser 08/23/21 0.61 - 1.24 mg/dL   Calcium 8.8 (L) 8.9 - 10.3 mg/dL   GFR, Estimated 7.41 >28 mL/min   Anion gap 6 5 - 15  CBC with Differential/Platelet     Status: Abnormal   Collection Time: 08/21/21  5:12 AM  Result Value Ref Range   WBC 9.9 4.0 - 10.5 K/uL   RBC 3.68 (L) 4.22 - 5.81 MIL/uL   Hemoglobin 8.7 (L) 13.0 - 17.0 g/dL   HCT 08/23/21 (L) 67.6 - 72.0 %   MCV 77.7 (L) 80.0 - 100.0 fL   MCH 23.6 (L) 26.0 - 34.0 pg   MCHC 30.4 30.0 - 36.0 g/dL   RDW 94.7 (H) 09.6 - 28.3 %   Platelets 168 150 - 400 K/uL   nRBC 0.0 0.0 - 0.2 %   Neutrophils Relative % 72 %   Neutro Abs 7.1 1.7 - 7.7 K/uL   Lymphocytes Relative 15 %   Lymphs Abs 1.5 0.7 - 4.0 K/uL   Monocytes Relative 12 %   Monocytes Absolute 1.2 (H) 0.1 -  1.0 K/uL   Eosinophils Relative 1 %   Eosinophils Absolute 0.1 0.0 - 0.5 K/uL   Basophils Relative 0 %   Basophils Absolute 0.0 0.0 - 0.1 K/uL   Immature Granulocytes 0 %   Abs Immature Granulocytes 0.04 0.00 - 0.07 K/uL    DG Ankle Complete Left  Result Date: 08/19/2021 CLINICAL DATA:  Postoperative, ORIF EXAM: LEFT ANKLE COMPLETE - 3+ VIEW COMPARISON:  08/16/2021 FINDINGS: Status post plate and screw fixation of the distal left fibula and screw fixation of the medial malleolus with anatomic alignment of fractures. Soft tissue edema about the foot and ankle. Cast material about the foot and ankle. IMPRESSION: Status post plate and screw fixation of the distal left fibula and screw fixation of the medial malleolus with anatomic alignment of fractures. Electronically Signed   By: 08/18/2021 M.D.   On: 08/19/2021 13:52    Assessment/Plan: 2 Days Post-Op   Principal Problem:   Closed left ankle  fracture Active Problems:   GI bleeding  Patient stable postop.  Continue elevation left lower extremity.  Patient must remain nonweightbearing left lower extremity.  Continue physical therapy.  Patient is not getting relief from Robaxin and Flexeril will be ordered instead.  Continue current pain management.  Patient has restarted on his Eliquis which should cover him for DVT prophylaxis.    Juanell Fairly , MD 08/21/2021, 12:07 PM

## 2021-08-21 NOTE — Progress Notes (Signed)
Physical Therapy Treatment Patient Details Name: [name] MRN: [id number] DOB: 1940-10-06 Today's Date: 08/21/2021   History of Present Illness [name] is a 80 y.o. Caucasian male with medical history significant for hypertension, dyslipidemia, migraines, chronic atrial fibrillation, and chronic low back pain, who presented to the ER on 08/16/2021 with acute onset of fall with associated syncope and left ankle injury and subsequent pain.  The patient was walking back from her bathroom and suddenly experienced lightheadedness and diaphoresis then passed out falling to the floor. He was admitted forGI bleeding with acute blood loss anemia,  syncope secondary to symptomatic anemia with subsequent fall and left ankle fracture. He had blood transfusion. He has Afib.  On 08/19/2021 he underwent  OPEN REDUCTION INTERNAL FIXATION (ORIF) LEFT BIMALLEOLAR ANKLE FRACTURE. He is NWB on L LE.    PT Comments    Pain controlled today.  Feeling good.  Participated in exercises as described below.  Encouraged HEP several times a day to maintain LE strength.  He is able to get to EOB with rails and min guard. Lateral scoot to drop arm recliner at bedside with min guard and set up.  Does very well maintaining NWB in lateral scoot transfer with no LOB and good understanding of technique.  Pt remained up in recliner after session and discussed transfer with tech.   Recommendations for follow up therapy are one component of a multi-disciplinary discharge planning process, led by the attending physician.  Recommendations may be updated based on patient status, additional functional criteria and insurance authorization.  Follow Up Recommendations  Skilled nursing-short term rehab (<3 hours/day)     Assistance Recommended at Discharge Frequent or constant Supervision/Assistance  Equipment Recommendations  BSC/3in1;Wheelchair (measurements PT)    Recommendations for Other Services        Precautions / Restrictions Precautions Precautions: Fall Restrictions Weight Bearing Restrictions: Yes LLE Weight Bearing: Non weight bearing     Mobility  Bed Mobility Overal bed mobility: Needs Assistance Bed Mobility: Supine to Sit     Supine to sit: Min guard          Transfers Overall transfer level: Needs assistance Equipment used: None Transfers: Bed to chair/wheelchair/BSC Sit to Stand: Min assist          Lateral/Scoot Transfers: Min guard General transfer comment: does very well with alteral scoot to drop arm recliner.    Ambulation/Gait               General Gait Details: uanble to maintain NWB yesterday so deferred today.   Stairs             Wheelchair Mobility    Modified Rankin (Stroke Patients Only)       Balance Overall balance assessment: Needs assistance Sitting-balance support: Feet supported;Bilateral upper extremity supported Sitting balance-Leahy Scale: Good Sitting balance - Comments: does well with lateral scoot with no LOB                                    Cognition Arousal/Alertness: Awake/alert Behavior During Therapy: WFL for tasks assessed/performed Overall Cognitive Status: Within Functional Limits for tasks assessed                                          Exercises Other Exercises Other Exercises: supine and  seated AROM x 10 and HEP education    General Comments        Pertinent Vitals/Pain Pain Assessment: Faces Faces Pain Scale: Hurts a little bit Pain Location: left ankle when accidently puts pressure on it Pain Descriptors / Indicators: Discomfort Pain Intervention(s): Limited activity within patient's tolerance;Monitored during session;Repositioned    Home Living                          Prior Function            PT Goals (current goals can now be found in the care plan section) Progress towards PT goals: Progressing toward goals     Frequency    7X/week      PT Plan Current plan remains appropriate    Co-evaluation              AM-PAC PT "6 Clicks" Mobility   Outcome Measure  Help needed turning from your back to your side while in a flat bed without using bedrails?: None Help needed moving from lying on your back to sitting on the side of a flat bed without using bedrails?: None Help needed moving to and from a bed to a chair (including a wheelchair)?: A Lot Help needed standing up from a chair using your arms (e.g., wheelchair or bedside chair)?: A Lot Help needed to walk in hospital room?: Total Help needed climbing 3-5 steps with a railing? : Total 6 Click Score: 14    End of Session Equipment Utilized During Treatment: Gait belt Activity Tolerance: Patient tolerated treatment well Patient left: in chair;with call bell/phone within reach;with chair alarm set;with family/visitor present Nurse Communication: Mobility status PT Visit Diagnosis: Unsteadiness on feet (R26.81);History of falling (Z91.81);Difficulty in walking, not elsewhere classified (R26.2);Pain;Muscle weakness (generalized) (M62.81) Pain - Right/Left: Left Pain - part of body: Ankle and joints of foot     Time: 5176-1607 PT Time Calculation (min) (ACUTE ONLY): 18 min  Charges:  $Therapeutic Exercise: 8-22 mins                    Danielle Dess, PTA 08/21/21, 10:39 AM

## 2021-08-21 NOTE — TOC Progression Note (Signed)
Transition of Care [facility]) - Progression Note    Patient Details  Name: [name] MRN: [id number] Date of Birth: 1941/09/17  [facility] of [facility]) CM/SW [place], RN Phone Number: 08/21/2021, 10:49 AM  Clinical Narrative:    Met with the patient, his wife and son in the room, They are agreeable to a bedsearch for STR,  He has been to [facility] in the past and they do not want to go there, Will review other options once obtained        Expected Discharge Plan and Services                                                 Social Determinants of Health (SDOH) Interventions    Readmission Risk Interventions No flowsheet data found.

## 2021-08-21 NOTE — Progress Notes (Addendum)
Progress Note    [name]  LAG:[id number] DOB: 09/08/1941  DOA: 08/16/2021 PCP: Jerl Mina, MD      Brief Narrative:    Medical records reviewed and are as summarized below:  [name] is a 80 y.o. male with medical history significant for left lower extremity DVT in 2011, subarachnoid hemorrhage in 2010, hypertension, dyslipidemia, migraine, chronic atrial fibrillation, chronic low back pain, who presented to the hospital because of syncope leading to a fall and left ankle injury.      Assessment/Plan:   Principal Problem:   Closed left ankle fracture Active Problems:   GI bleeding   Body mass index is 30.92 kg/m.  (Obesity)  Left ankle fracture: S/p ORIF left bimalleolar ankle fracture.  Continue analgesics as needed for pain.  PT and OT recommend discharge to SNF for rehab.  Follow-up with orthopedic surgeon.  Acute blood loss anemia: H&H is stable.  S/p transfusion with 2 units of PRBCs.   Positive heme stool: No melena or hematemesis.  Change IV to oral Protonix.  S/p syncope: This was probably from anemia  Other comorbidities include hypertension, dyslipidemia, vitamin B12 deficiency, atrial fibrillation on Eliquis    Diet Order             Diet regular Room service appropriate? Yes; Fluid consistency: Thin  Diet effective now                      Consultants: Gastroenterologist Orthopedic surgeon  Procedures: ORIF left bimalleolar ankle fracture    Medications:    aliskiren  300 mg Oral Daily   apixaban  5 mg Oral BID   carbamazepine  200 mg Oral BID   docusate sodium  100 mg Oral BID   doxazosin  1 mg Oral QHS   irbesartan  300 mg Oral Daily   mupirocin ointment  1 application Nasal BID   omega-3 acid ethyl esters  1 g Oral Daily   pantoprazole  40 mg Oral BID   senna  1 tablet Oral BID   simvastatin  20 mg Oral BID   traMADol  50 mg Oral Q6H   vitamin B-12  1,000 mcg Oral Daily   Continuous  Infusions:  methocarbamol (ROBAXIN) IV     promethazine (PHENERGAN) injection (IM or IVPB) Stopped (08/16/21 1903)     Anti-infectives (From admission, onward)    Start     Dose/Rate Route Frequency Ordered Stop   08/19/21 1700  ceFAZolin (ANCEF) IVPB 2g/100 mL premix        2 g 200 mL/hr over 30 Minutes Intravenous Every 6 hours 08/19/21 1445 08/20/21 0013   08/19/21 1038  ceFAZolin (ANCEF) 2-4 GM/100ML-% IVPB       Note to Pharmacy: Desma Paganini   : cabinet override      08/19/21 1038 08/20/21 0013   08/19/21 1033  ceFAZolin (ANCEF) IVPB 2g/100 mL premix        2 g 200 mL/hr over 30 Minutes Intravenous 30 min pre-op 08/19/21 1033 08/19/21 1122              Family Communication/Anticipated D/C date and plan/Code Status   DVT prophylaxis: SCDs Start: 08/19/21 1446 Place TED hose Start: 08/19/21 1446 Place and maintain sequential compression device Start: 08/16/21 2032 apixaban (ELIQUIS) tablet 5 mg     Code Status: Full Code  Family Communication: Plan discussed with his wife at the bedside Disposition Plan: Discharge to home versus  SNF in 2 to 3 days   Status is: Inpatient  Remains inpatient appropriate because: Awaiting placement to SNF          Subjective:   Interval events noted.  No complaints.  Pain in the left foot is better.  No rectal bleeding  Objective:    Vitals:   08/20/21 2132 08/21/21 0220 08/21/21 0730 08/21/21 1121  BP: (!) 145/81 125/65 116/66 140/74  Pulse: 68 94 85 80  Resp: 18 16 16 18   Temp: 99.3 F (37.4 C) 98.7 F (37.1 C) 98.2 F (36.8 C) 98.9 F (37.2 C)  TempSrc:   Oral Oral  SpO2: 94% 95% 97% 96%  Weight:      Height:       No data found.   Intake/Output Summary (Last 24 hours) at 08/21/2021 1201 Last data filed at 08/21/2021 1045 Gross per 24 hour  Intake 480 ml  Output 850 ml  Net -370 ml   Filed Weights   08/16/21 1319 08/17/21 0300  Weight: 101 kg 103.4 kg    Exam:  GEN: NAD SKIN: Warm and  dry EYES: No pallor or icterus ENT: MMM CV: RRR PULM: CTA B ABD: soft, ND, NT, +BS CNS: AAO x 3, non focal EXT: Dressing on the left leg/foot is clean, dry and intact            Data Reviewed:   I have personally reviewed following labs and imaging studies:  Labs: Labs show the following:   Basic Metabolic Panel: Recent Labs  Lab 08/16/21 1411 08/17/21 0420 08/20/21 0551 08/21/21 0512  NA 136 137 135 135  K 3.9 4.7 4.1 4.1  CL 105 106 106 106  CO2 26 27 24 23   GLUCOSE 120* 86 120* 109*  BUN 23 19 15 17   CREATININE 1.25* 0.99 0.86 0.89  CALCIUM 8.8* 8.7* 8.7* 8.8*   GFR Estimated Creatinine Clearance: 80.9 mL/min (by C-G formula based on SCr of 0.89 mg/dL). Liver Function Tests: Recent Labs  Lab 08/16/21 1411  AST 16  ALT 9  ALKPHOS 59  BILITOT 0.6  PROT 6.7  ALBUMIN 3.8   No results for input(s): LIPASE, AMYLASE in the last 168 hours. No results for input(s): AMMONIA in the last 168 hours. Coagulation profile Recent Labs  Lab 08/16/21 1411  INR 1.4*    CBC: Recent Labs  Lab 08/16/21 1411 08/16/21 2206 08/17/21 0420 08/17/21 0947 08/17/21 1434 08/17/21 2120 08/19/21 0336 08/20/21 0551 08/21/21 0512  WBC 7.4  --  10.1  --   --   --  7.9 10.8* 9.9  NEUTROABS 6.2  --   --   --   --   --  5.5 8.5* 7.1  HGB 7.3*   < > 7.6*   < > 8.8* 8.3* 8.1* 8.2* 8.7*  HCT 25.3*   < > 25.5*   < > 29.2* 27.5* 26.8* 27.5* 28.6*  MCV 75.7*  --  75.2*  --   --   --  76.1* 76.8* 77.7*  PLT 182  --  181  --   --   --  166 170 168   < > = values in this interval not displayed.   Cardiac Enzymes: No results for input(s): CKTOTAL, CKMB, CKMBINDEX, TROPONINI in the last 168 hours. BNP (last 3 results) No results for input(s): PROBNP in the last 8760 hours. CBG: Recent Labs  Lab 08/16/21 1405  GLUCAP 116*   D-Dimer: No results for input(s): DDIMER in the last  72 hours. Hgb A1c: No results for input(s): HGBA1C in the last 72 hours. Lipid Profile: No  results for input(s): CHOL, HDL, LDLCALC, TRIG, CHOLHDL, LDLDIRECT in the last 72 hours. Thyroid function studies: No results for input(s): TSH, T4TOTAL, T3FREE, THYROIDAB in the last 72 hours.  Invalid input(s): FREET3 Anemia work up: No results for input(s): VITAMINB12, FOLATE, FERRITIN, TIBC, IRON, RETICCTPCT in the last 72 hours. Sepsis Labs: Recent Labs  Lab 08/17/21 0420 08/19/21 0336 08/20/21 0551 08/21/21 0512  WBC 10.1 7.9 10.8* 9.9    Microbiology Recent Results (from the past 240 hour(s))  Resp Panel by RT-PCR (Flu A&B, Covid) Nasopharyngeal Swab     Status: None   Collection Time: 08/16/21  6:24 PM   Specimen: Nasopharyngeal Swab; Nasopharyngeal(NP) swabs in vial transport medium  Result Value Ref Range Status   SARS Coronavirus 2 by RT PCR NEGATIVE NEGATIVE Final    Comment: (NOTE) SARS-CoV-2 target nucleic acids are NOT DETECTED.  The SARS-CoV-2 RNA is generally detectable in upper respiratory specimens during the acute phase of infection. The lowest concentration of SARS-CoV-2 viral copies this assay can detect is 138 copies/mL. A negative result does not preclude SARS-Cov-2 infection and should not be used as the sole basis for treatment or other patient management decisions. A negative result may occur with  improper specimen collection/handling, submission of specimen other than nasopharyngeal swab, presence of viral mutation(s) within the areas targeted by this assay, and inadequate number of viral copies(<138 copies/mL). A negative result must be combined with clinical observations, patient history, and epidemiological information. The expected result is Negative.  Fact Sheet for Patients:  BloggerCourse.com  Fact Sheet for Healthcare Providers:  SeriousBroker.it  This test is no t yet approved or cleared by the [place] FDA and  has been authorized for detection and/or diagnosis of SARS-CoV-2  by FDA under an Emergency Use Authorization (EUA). This EUA will remain  in effect (meaning this test can be used) for the duration of the COVID-19 declaration under Section 564(b)(1) of the Act, 21 U.S.C.section 360bbb-3(b)(1), unless the authorization is terminated  or revoked sooner.       Influenza A by PCR NEGATIVE NEGATIVE Final   Influenza B by PCR NEGATIVE NEGATIVE Final    Comment: (NOTE) The Xpert Xpress SARS-CoV-2/FLU/RSV plus assay is intended as an aid in the diagnosis of influenza from Nasopharyngeal swab specimens and should not be used as a sole basis for treatment. Nasal washings and aspirates are unacceptable for Xpert Xpress SARS-CoV-2/FLU/RSV testing.  Fact Sheet for Patients: BloggerCourse.com  Fact Sheet for Healthcare Providers: SeriousBroker.it  This test is not yet approved or cleared by the [place] FDA and has been authorized for detection and/or diagnosis of SARS-CoV-2 by FDA under an Emergency Use Authorization (EUA). This EUA will remain in effect (meaning this test can be used) for the duration of the COVID-19 declaration under Section 564(b)(1) of the Act, 21 U.S.C. section 360bbb-3(b)(1), unless the authorization is terminated or revoked.  Performed at [facility], [address] [place], [place] [address]   Surgical PCR screen     Status: Abnormal   Collection Time: 08/18/21  2:17 PM   Specimen: Nasal Mucosa; Nasal Swab  Result Value Ref Range Status   MRSA, PCR NEGATIVE NEGATIVE Final   Staphylococcus aureus POSITIVE (A) NEGATIVE Final    Comment: (NOTE) The Xpert SA Assay (FDA approved for NASAL specimens in patients 78 years of age and older), is one component of a comprehensive  surveillance program. It is not intended to diagnose infection nor to guide or monitor treatment. Performed at [facility], [address] [place], [place] [address]      Procedures and diagnostic studies:  DG Ankle Complete Left  Result Date: 08/19/2021 CLINICAL DATA:  Postoperative, ORIF EXAM: LEFT ANKLE COMPLETE - 3+ VIEW COMPARISON:  08/16/2021 FINDINGS: Status post plate and screw fixation of the distal left fibula and screw fixation of the medial malleolus with anatomic alignment of fractures. Soft tissue edema about the foot and ankle. Cast material about the foot and ankle. IMPRESSION: Status post plate and screw fixation of the distal left fibula and screw fixation of the medial malleolus with anatomic alignment of fractures. Electronically Signed   By: Jearld Lesch M.D.   On: 08/19/2021 13:52               LOS: 5 days   Cope Marte  [facility]   Pager on [email or web address]. If 7PM-7AM, please contact night-coverage at [email or web address]     08/21/2021, 12:01 PM

## 2021-08-21 NOTE — NC FL2 (Signed)
[place] MEDICAID FL2 LEVEL OF CARE SCREENING TOOL     IDENTIFICATION  Patient Name: [name] Birthdate: 04/16/1941 Sex: male Admission Date (Current Location): 08/16/2021  [facility] and [place] Number:  Chiropodist and Address:  [facility], [address], [place], [place] [address]      Provider Number: [id number]  Attending Physician Name and Address:  Lurene Shadow, MD  Relative Name and Phone Number:  [name] spouse [phone]    Current Level of Care: Hospital Recommended Level of Care: Skilled Nursing Facility Prior Approval Number:    Date Approved/Denied:   PASRR Number: [id number]  Discharge Plan: SNF    Current Diagnoses: Patient Active Problem List   Diagnosis Date Noted   Closed left ankle fracture 08/19/2021   GI bleeding 08/16/2021   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/10/2020   History of cerebral hemorrhage 10/27/2019   Arthritis 10/21/2019   Hyperlipidemia 10/21/2019   Hypertension 10/21/2019   Seizures (HCC) 10/21/2019   Stroke (HCC) 10/21/2019   DVT (deep venous thrombosis) (HCC) 10/21/2019   Atrial fibrillation (HCC) 02/17/2019   Bilateral swelling of feet 01/23/2009   Swelling of both ankles 09/25/2001    Orientation RESPIRATION BLADDER Height & Weight     Self, Time, Situation, Place  Normal Indwelling catheter Weight: 103.4 kg Height:  6' (182.9 cm)  BEHAVIORAL SYMPTOMS/MOOD NEUROLOGICAL BOWEL NUTRITION STATUS      Continent Diet (See DC summary)  AMBULATORY STATUS COMMUNICATION OF NEEDS Skin   Extensive Assist Verbally Normal, Surgical wounds                       Personal Care Assistance Level of Assistance  Bathing, Feeding, Dressing Bathing Assistance: Limited assistance Feeding assistance: Independent Dressing Assistance: Limited assistance     Functional Limitations Info  Sight, Hearing, Speech Sight Info: Adequate Hearing Info: Adequate Speech Info: Adequate     SPECIAL CARE FACTORS FREQUENCY                       Contractures Contractures Info: Not present    Additional Factors Info  Code Status, Allergies Code Status Info: Full COde Allergies Info: Codeine, Fentanyl, Regadenoson, Verapamil           Current Medications (08/21/2021):  This is the current hospital active medication list Current Facility-Administered Medications  Medication Dose Route Frequency Provider Last Rate Last Admin   acetaminophen (TYLENOL) tablet 325-650 mg  325-650 mg Oral Q6H PRN Juanell Fairly, MD   500 mg at 08/20/21 1230   aliskiren (TEKTURNA) tablet 300 mg  300 mg Oral Daily Juanell Fairly, MD   300 mg at 08/21/21 1026   alum & mag hydroxide-simeth (MAALOX/MYLANTA) 200-200-20 MG/5ML suspension 30 mL  30 mL Oral Q4H PRN Juanell Fairly, MD       apixaban Everlene Balls) tablet 5 mg  5 mg Oral BID Lurene Shadow, MD   5 mg at 08/21/21 1026   bisacodyl (DULCOLAX) suppository 10 mg  10 mg Rectal Daily PRN Juanell Fairly, MD       carbamazepine (TEGRETOL) tablet 200 mg  200 mg Oral BID Juanell Fairly, MD   200 mg at 08/21/21 1026   docusate sodium (COLACE) capsule 100 mg  100 mg Oral BID Juanell Fairly, MD   100 mg at 08/21/21 1026   doxazosin (CARDURA) tablet 1 mg  1 mg Oral QHS Juanell Fairly, MD   1 mg at 08/20/21 2205   HYDROcodone-acetaminophen (  NORCO) 7.5-325 MG per tablet 1-2 tablet  1-2 tablet Oral Q4H PRN Juanell Fairly, MD   1 tablet at 08/20/21 1413   HYDROcodone-acetaminophen (NORCO/VICODIN) 5-325 MG per tablet 1-2 tablet  1-2 tablet Oral Q4H PRN Juanell Fairly, MD   2 tablet at 08/21/21 1025   irbesartan (AVAPRO) tablet 300 mg  300 mg Oral Daily Juanell Fairly, MD   300 mg at 08/21/21 1026   magnesium hydroxide (MILK OF MAGNESIA) suspension 30 mL  30 mL Oral Daily PRN Juanell Fairly, MD       menthol-cetylpyridinium (CEPACOL) lozenge 3 mg  1 lozenge Oral PRN Juanell Fairly, MD       Or   phenol (CHLORASEPTIC) mouth spray 1  spray  1 spray Mouth/Throat PRN Juanell Fairly, MD       methocarbamol (ROBAXIN) tablet 500 mg  500 mg Oral Q6H PRN Juanell Fairly, MD   500 mg at 08/21/21 1026   Or   methocarbamol (ROBAXIN) 500 mg in dextrose 5 % 50 mL IVPB  500 mg Intravenous Q6H PRN Juanell Fairly, MD       morphine 2 MG/ML injection 0.5-1 mg  0.5-1 mg Intravenous Q2H PRN Juanell Fairly, MD   1 mg at 08/20/21 2159   mupirocin ointment (BACTROBAN) 2 % 1 application  1 application Nasal BID Juanell Fairly, MD   1 application at 08/20/21 2208   omega-3 acid ethyl esters (LOVAZA) capsule 1 g  1 g Oral Daily Juanell Fairly, MD   1 g at 08/21/21 1026   ondansetron (ZOFRAN) tablet 4 mg  4 mg Oral Q6H PRN Juanell Fairly, MD   4 mg at 08/20/21 0940   Or   ondansetron (ZOFRAN) injection 4 mg  4 mg Intravenous Q6H PRN Juanell Fairly, MD   4 mg at 08/20/21 0347   pantoprazole (PROTONIX) injection 40 mg  40 mg Intravenous Q12H Juanell Fairly, MD   40 mg at 08/21/21 1026   polyethylene glycol (MIRALAX / GLYCOLAX) packet 17 g  17 g Oral Daily PRN Juanell Fairly, MD       promethazine (PHENERGAN) 12.5 mg in sodium chloride 0.9 % 50 mL IVPB  12.5 mg Intravenous Q6H PRN Juanell Fairly, MD   Held at 08/16/21 1903   senna (SENOKOT) tablet 8.6 mg  1 tablet Oral BID Juanell Fairly, MD   8.6 mg at 08/21/21 1026   simvastatin (ZOCOR) tablet 20 mg  20 mg Oral BID Juanell Fairly, MD   20 mg at 08/21/21 1026   traMADol (ULTRAM) tablet 50 mg  50 mg Oral Q6H Juanell Fairly, MD   50 mg at 08/21/21 0551   traZODone (DESYREL) tablet 25 mg  25 mg Oral QHS PRN Juanell Fairly, MD   25 mg at 08/20/21 2205   vitamin B-12 (CYANOCOBALAMIN) tablet 1,000 mcg  1,000 mcg Oral Daily Juanell Fairly, MD   1,000 mcg at 08/21/21 1026     Discharge Medications: Please see discharge summary for a list of discharge medications.  Relevant Imaging Results:  Relevant Lab Results:   Additional Information SSN - [id number]  Marlowe Sax, RN

## 2021-08-22 LAB — GLUCOSE, CAPILLARY: Glucose-Capillary: 111 mg/dL — ABNORMAL HIGH (ref 70–99)

## 2021-08-22 MED ORDER — BISACODYL 10 MG RE SUPP
10.0000 mg | Freq: Once | RECTAL | Status: AC
Start: 2021-08-22 — End: 2021-08-22
  Administered 2021-08-22: 12:00:00 10 mg via RECTAL
  Filled 2021-08-22: qty 1

## 2021-08-22 NOTE — Progress Notes (Signed)
Physical Therapy Treatment Patient Details Name: [name] MRN: [id number] DOB: 1941/07/04 Today's Date: 08/22/2021   History of Present Illness [name] is a 80 y.o. Caucasian male with medical history significant for hypertension, dyslipidemia, migraines, chronic atrial fibrillation, and chronic low back pain, who presented to the ER on 08/16/2021 with acute onset of fall with associated syncope and left ankle injury and subsequent pain.  The patient was walking back from her bathroom and suddenly experienced lightheadedness and diaphoresis then passed out falling to the floor. He was admitted forGI bleeding with acute blood loss anemia,  syncope secondary to symptomatic anemia with subsequent fall and left ankle fracture. He had blood transfusion. He has Afib.  On 08/19/2021 he underwent  OPEN REDUCTION INTERNAL FIXATION (ORIF) LEFT BIMALLEOLAR ANKLE FRACTURE. He is NWB on L LE.    PT Comments    Assisted OT with transfer and standing attempts earlier this am.  No charge. Pt is able to stand to RW with min a x 2 and elevated bed height.  He does well with NWB today in standing but is unable to hop/pivot with RW and maintain WB status.  He is able to reach for armrest of chair and pivot in standing with +2 assist.  Returned later in am to assist pt back to bed.  He is able to lateral scoot back to bed with supervision but due to positioning needs min a x 1 to get LE's onto bed.  He does well bridging to reposition in bed.  Pt is noted to sound "tight" with breath sounds.  Sats 98% after transfer on room air.  He was also noted to be similar yesterday but it does some a bit more today.  Son in agreement.  Discussed with RN.  Pt did say yesterday he has noticed it over the past few years and attributes it to immobility.  Will secure chat MD.     Recommendations for follow up therapy are one component of a multi-disciplinary discharge planning process, led by the attending physician.   Recommendations may be updated based on patient status, additional functional criteria and insurance authorization.  Follow Up Recommendations  Skilled nursing-short term rehab (<3 hours/day)     Assistance Recommended at Discharge Frequent or constant Supervision/Assistance  Equipment Recommendations  BSC/3in1;Wheelchair (measurements PT)    Recommendations for Other Services       Precautions / Restrictions Precautions Precautions: Fall Restrictions Weight Bearing Restrictions: Yes LLE Weight Bearing: Non weight bearing     Mobility  Bed Mobility Overal bed mobility: Needs Assistance         Sit to supine: Min assist        Transfers Overall transfer level: Needs assistance Equipment used: None;Rolling walker (2 wheels) Transfers: Sit to/from Stand;Bed to chair/wheelchair/BSC Sit to Stand: Min assist;+2 physical assistance Stand pivot transfers: Min assist;+2 physical assistance        Lateral/Scoot Transfers: Min guard General transfer comment: does very well with alteral scoot to drop arm recliner.  struggles with stnd pivot with walker    Ambulation/Gait                   Stairs             Wheelchair Mobility    Modified Rankin (Stroke Patients Only)       Balance   Sitting-balance support: Feet supported;Bilateral upper extremity supported Sitting balance-Leahy Scale: Good     Standing balance support: Bilateral upper extremity supported  Standing balance-Leahy Scale: Poor Standing balance comment: dependent on RW for balance                            Cognition Arousal/Alertness: Awake/alert Behavior During Therapy: WFL for tasks assessed/performed Overall Cognitive Status: Within Functional Limits for tasks assessed                                          Exercises      General Comments        Pertinent Vitals/Pain Pain Assessment: No/denies pain    Home Living                           Prior Function            PT Goals (current goals can now be found in the care plan section) Progress towards PT goals: Progressing toward goals    Frequency    7X/week      PT Plan Current plan remains appropriate    Co-evaluation              AM-PAC PT "6 Clicks" Mobility   Outcome Measure  Help needed turning from your back to your side while in a flat bed without using bedrails?: None Help needed moving from lying on your back to sitting on the side of a flat bed without using bedrails?: None Help needed moving to and from a bed to a chair (including a wheelchair)?: A Little Help needed standing up from a chair using your arms (e.g., wheelchair or bedside chair)?: A Lot Help needed to walk in hospital room?: Total Help needed climbing 3-5 steps with a railing? : Total 6 Click Score: 15    End of Session Equipment Utilized During Treatment: Gait belt Activity Tolerance: Patient tolerated treatment well Patient left: in bed;with call bell/phone within reach;with bed alarm set;with family/visitor present Nurse Communication: Mobility status;Patient requests pain meds PT Visit Diagnosis: Unsteadiness on feet (R26.81);History of falling (Z91.81);Difficulty in walking, not elsewhere classified (R26.2);Pain;Muscle weakness (generalized) (M62.81) Pain - Right/Left: Left Pain - part of body: Ankle and joints of foot     Time: 1105-1130 PT Time Calculation (min) (ACUTE ONLY): 25 min  Charges:  $Therapeutic Activity: 23-37 mins                    Danielle Dess, PTA 08/22/21, 11:35 AM

## 2021-08-22 NOTE — Plan of Care (Signed)
  Problem: Pain Managment: Goal: General experience of comfort will improve Outcome: Progressing   Problem: Safety: Goal: Ability to remain free from injury will improve Outcome: Progressing   Problem: Skin Integrity: Goal: Risk for impaired skin integrity will decrease Outcome: Progressing   Problem: Education: Goal: Ability to identify signs and symptoms of gastrointestinal bleeding will improve Outcome: Progressing

## 2021-08-22 NOTE — Care Management Important Message (Signed)
Important Message  Patient Details  Name: [name] MRN: [id number] Date of Birth: September 23, 1941   Medicare Important Message Given:  Yes     Olegario Messier A Terik Haughey 08/22/2021, 3:51 PM

## 2021-08-22 NOTE — TOC Progression Note (Signed)
Transition of Care [facility]) - Progression Note    Patient Details  Name: [name] MRN: [id number] Date of Birth: 10-25-1940  [facility] CM/SW Contact  Marlowe Sax, RN Phone Number: 08/22/2021, 9:18 AM  Clinical Narrative:    Reached out to the local facilities requesting them to review for a bed offer        Expected Discharge Plan and Services                                                 Social Determinants of Health (SDOH) Interventions    Readmission Risk Interventions No flowsheet data found.

## 2021-08-22 NOTE — Progress Notes (Signed)
Progress Note    BURNETT SPRAY  QVZ:[id number] DOB: 11-07-1940  DOA: 08/16/2021 PCP: Jerl Mina, MD      Brief Narrative:    Medical records reviewed and are as summarized below:  [name] is a 80 y.o. male with medical history significant for left lower extremity DVT in 2011, subarachnoid hemorrhage in 2010, hypertension, dyslipidemia, migraine, chronic atrial fibrillation, chronic low back pain, who presented to the hospital because of syncope leading to a fall and left ankle injury.      Assessment/Plan:   Principal Problem:   Closed left ankle fracture Active Problems:   GI bleeding   Body mass index is 30.92 kg/m.  (Obesity)  Left ankle fracture: S/p ORIF left bimalleolar ankle fracture.  Continue analgesics for pain.  Continue PT and OT.  Follow-up with orthopedic surgeon.  Constipation: Continue laxatives.  Scheduled Dulcolax has been ordered.  Acute blood loss anemia: H&H is stable.  S/p transfusion with 2 units of PRBCs.   Positive heme stool: No melena or hematemesis.  Continue Protonix  S/p syncope: This was probably from anemia  Other comorbidities include hypertension, dyslipidemia, vitamin B12 deficiency, atrial fibrillation on Eliquis    Diet Order             Diet regular Room service appropriate? Yes; Fluid consistency: Thin  Diet effective now                      Consultants: Gastroenterologist Orthopedic surgeon  Procedures: ORIF left bimalleolar ankle fracture    Medications:    aliskiren  300 mg Oral Daily   apixaban  5 mg Oral BID   carbamazepine  200 mg Oral BID   docusate sodium  100 mg Oral BID   doxazosin  1 mg Oral QHS   irbesartan  300 mg Oral Daily   mupirocin ointment  1 application Nasal BID   omega-3 acid ethyl esters  1 g Oral Daily   pantoprazole  40 mg Oral BID   senna  1 tablet Oral BID   simvastatin  20 mg Oral BID   traMADol  50 mg Oral Q6H   vitamin B-12  1,000 mcg Oral  Daily   Continuous Infusions:  promethazine (PHENERGAN) injection (IM or IVPB) Stopped (08/16/21 1903)     Anti-infectives (From admission, onward)    Start     Dose/Rate Route Frequency Ordered Stop   08/19/21 1700  ceFAZolin (ANCEF) IVPB 2g/100 mL premix        2 g 200 mL/hr over 30 Minutes Intravenous Every 6 hours 08/19/21 1445 08/20/21 0013   08/19/21 1038  ceFAZolin (ANCEF) 2-4 GM/100ML-% IVPB       Note to Pharmacy: Desma Paganini   : cabinet override      08/19/21 1038 08/20/21 0013   08/19/21 1033  ceFAZolin (ANCEF) IVPB 2g/100 mL premix        2 g 200 mL/hr over 30 Minutes Intravenous 30 min pre-op 08/19/21 1033 08/19/21 1122              Family Communication/Anticipated D/C date and plan/Code Status   DVT prophylaxis: SCDs Start: 08/19/21 1446 Place TED hose Start: 08/19/21 1446 Place and maintain sequential compression device Start: 08/16/21 2032 apixaban (ELIQUIS) tablet 5 mg     Code Status: Full Code  Family Communication: Plan discussed with his son at the bedside Disposition Plan: Discharge to home versus SNF tomorrow   Status is: Inpatient  Remains inpatient appropriate because: Awaiting placement to SNF           Subjective:   C/o constipation.  No bowel movement since surgery.  Objective:    Vitals:   08/22/21 0329 08/22/21 0736 08/22/21 1127 08/22/21 1151  BP: (!) 155/80 (!) 131/92  131/64  Pulse: 83 86  60  Resp: 18 14  18   Temp: 98.2 F (36.8 C) 98.5 F (36.9 C)  98.2 F (36.8 C)  TempSrc: Oral     SpO2: 96% 94% 98% 95%  Weight:      Height:       No data found.   Intake/Output Summary (Last 24 hours) at 08/22/2021 1220 Last data filed at 08/22/2021 1039 Gross per 24 hour  Intake 360 ml  Output 850 ml  Net -490 ml   Filed Weights   08/16/21 1319 08/17/21 0300  Weight: 101 kg 103.4 kg    Exam:  GEN: NAD SKIN: Warm and dry EYES: EOMI ENT: MMM CV: RRR PULM: CTA B ABD: soft, ND, NT, +BS CNS: AAO x 3,  non focal EXT: Dressing on the left leg/foot is clean, dry and intact            Data Reviewed:   I have personally reviewed following labs and imaging studies:  Labs: Labs show the following:   Basic Metabolic Panel: Recent Labs  Lab 08/16/21 1411 08/17/21 0420 08/20/21 0551 08/21/21 0512  NA 136 137 135 135  K 3.9 4.7 4.1 4.1  CL 105 106 106 106  CO2 26 27 24 23   GLUCOSE 120* 86 120* 109*  BUN 23 19 15 17   CREATININE 1.25* 0.99 0.86 0.89  CALCIUM 8.8* 8.7* 8.7* 8.8*   GFR Estimated Creatinine Clearance: 80.9 mL/min (by C-G formula based on SCr of 0.89 mg/dL). Liver Function Tests: Recent Labs  Lab 08/16/21 1411  AST 16  ALT 9  ALKPHOS 59  BILITOT 0.6  PROT 6.7  ALBUMIN 3.8   No results for input(s): LIPASE, AMYLASE in the last 168 hours. No results for input(s): AMMONIA in the last 168 hours. Coagulation profile Recent Labs  Lab 08/16/21 1411  INR 1.4*    CBC: Recent Labs  Lab 08/16/21 1411 08/16/21 2206 08/17/21 0420 08/17/21 0947 08/17/21 1434 08/17/21 2120 08/19/21 0336 08/20/21 0551 08/21/21 0512  WBC 7.4  --  10.1  --   --   --  7.9 10.8* 9.9  NEUTROABS 6.2  --   --   --   --   --  5.5 8.5* 7.1  HGB 7.3*   < > 7.6*   < > 8.8* 8.3* 8.1* 8.2* 8.7*  HCT 25.3*   < > 25.5*   < > 29.2* 27.5* 26.8* 27.5* 28.6*  MCV 75.7*  --  75.2*  --   --   --  76.1* 76.8* 77.7*  PLT 182  --  181  --   --   --  166 170 168   < > = values in this interval not displayed.   Cardiac Enzymes: No results for input(s): CKTOTAL, CKMB, CKMBINDEX, TROPONINI in the last 168 hours. BNP (last 3 results) No results for input(s): PROBNP in the last 8760 hours. CBG: Recent Labs  Lab 08/16/21 1405 08/22/21 0832  GLUCAP 116* 111*   D-Dimer: No results for input(s): DDIMER in the last 72 hours. Hgb A1c: No results for input(s): HGBA1C in the last 72 hours. Lipid Profile: No results for input(s): CHOL, HDL, LDLCALC,  TRIG, CHOLHDL, LDLDIRECT in the last 72  hours. Thyroid function studies: No results for input(s): TSH, T4TOTAL, T3FREE, THYROIDAB in the last 72 hours.  Invalid input(s): FREET3 Anemia work up: No results for input(s): VITAMINB12, FOLATE, FERRITIN, TIBC, IRON, RETICCTPCT in the last 72 hours. Sepsis Labs: Recent Labs  Lab 08/17/21 0420 08/19/21 0336 08/20/21 0551 08/21/21 0512  WBC 10.1 7.9 10.8* 9.9    Microbiology Recent Results (from the past 240 hour(s))  Resp Panel by RT-PCR (Flu A&B, Covid) Nasopharyngeal Swab     Status: None   Collection Time: 08/16/21  6:24 PM   Specimen: Nasopharyngeal Swab; Nasopharyngeal(NP) swabs in vial transport medium  Result Value Ref Range Status   SARS Coronavirus 2 by RT PCR NEGATIVE NEGATIVE Final    Comment: (NOTE) SARS-CoV-2 target nucleic acids are NOT DETECTED.  The SARS-CoV-2 RNA is generally detectable in upper respiratory specimens during the acute phase of infection. The lowest concentration of SARS-CoV-2 viral copies this assay can detect is 138 copies/mL. A negative result does not preclude SARS-Cov-2 infection and should not be used as the sole basis for treatment or other patient management decisions. A negative result may occur with  improper specimen collection/handling, submission of specimen other than nasopharyngeal swab, presence of viral mutation(s) within the areas targeted by this assay, and inadequate number of viral copies(<138 copies/mL). A negative result must be combined with clinical observations, patient history, and epidemiological information. The expected result is Negative.  Fact Sheet for Patients:  BloggerCourse.com  Fact Sheet for Healthcare Providers:  SeriousBroker.it  This test is no t yet approved or cleared by the [place] FDA and  has been authorized for detection and/or diagnosis of SARS-CoV-2 by FDA under an Emergency Use Authorization (EUA). This EUA will remain  in effect  (meaning this test can be used) for the duration of the COVID-19 declaration under Section 564(b)(1) of the Act, 21 U.S.C.section 360bbb-3(b)(1), unless the authorization is terminated  or revoked sooner.       Influenza A by PCR NEGATIVE NEGATIVE Final   Influenza B by PCR NEGATIVE NEGATIVE Final    Comment: (NOTE) The Xpert Xpress SARS-CoV-2/FLU/RSV plus assay is intended as an aid in the diagnosis of influenza from Nasopharyngeal swab specimens and should not be used as a sole basis for treatment. Nasal washings and aspirates are unacceptable for Xpert Xpress SARS-CoV-2/FLU/RSV testing.  Fact Sheet for Patients: BloggerCourse.com  Fact Sheet for Healthcare Providers: SeriousBroker.it  This test is not yet approved or cleared by the [place] FDA and has been authorized for detection and/or diagnosis of SARS-CoV-2 by FDA under an Emergency Use Authorization (EUA). This EUA will remain in effect (meaning this test can be used) for the duration of the COVID-19 declaration under Section 564(b)(1) of the Act, 21 U.S.C. section 360bbb-3(b)(1), unless the authorization is terminated or revoked.  Performed at [facility], [address], [place], [place] [address]   Surgical PCR screen     Status: Abnormal   Collection Time: 08/18/21  2:17 PM   Specimen: Nasal Mucosa; Nasal Swab  Result Value Ref Range Status   MRSA, PCR NEGATIVE NEGATIVE Final   Staphylococcus aureus POSITIVE (A) NEGATIVE Final    Comment: (NOTE) The Xpert SA Assay (FDA approved for NASAL specimens in patients 30 years of age and older), is one component of a comprehensive surveillance program. It is not intended to diagnose infection nor to guide or monitor treatment. Performed at [facility], [address] [place], [place] [address]     Procedures and diagnostic studies:  No results found.             LOS: 6 days    [place]  [facility]   Pager on [email or web address]. If 7PM-7AM, please contact night-coverage at [email or web address]     08/22/2021, 12:20 PM

## 2021-08-22 NOTE — TOC Progression Note (Addendum)
Transition of Care (TOC) - Progression Note    Patient Details  Name: [name] MRN: [id number] Date of Birth: 07/05/1941  Transition of Care (TOC) CM/SW Contact   J Louvet, RN Phone Number: 08/22/2021, 12:51 PM  Clinical Narrative:   Met with the patient and his wife in the room and reviewed the bed offers She stated that she has had a friend go to [facility] in the past, She agreed to go to [facility]. Uploaded the clinical notes to [facility] ref number [id number]         Expected Discharge Plan and Services                                                 Social Determinants of Health (SDOH) Interventions    Readmission Risk Interventions No flowsheet data found.  

## 2021-08-22 NOTE — Anesthesia Postprocedure Evaluation (Signed)
Anesthesia Post Note  Patient: [name]  Procedure(s) Performed: OPEN REDUCTION INTERNAL FIXATION (ORIF) ANKLE FRACTURE (Left: Ankle)  Patient location during evaluation: PACU Anesthesia Type: General Level of consciousness: awake and alert Pain management: pain level controlled Vital Signs Assessment: post-procedure vital signs reviewed and stable Respiratory status: spontaneous breathing, nonlabored ventilation and respiratory function stable Cardiovascular status: blood pressure returned to baseline and stable Postop Assessment: no apparent nausea or vomiting Anesthetic complications: no   No notable events documented.   Last Vitals:  Vitals:   08/22/21 0329 08/22/21 0736  BP: (!) 155/80 (!) 131/92  Pulse: 83 86  Resp: 18 14  Temp: 36.8 C 36.9 C  SpO2: 96% 94%    Last Pain:  Vitals:   08/22/21 0457  TempSrc:   PainSc: Asleep                 Foye Deer

## 2021-08-22 NOTE — Progress Notes (Signed)
  Subjective:  POD #3 s/p ORIF of left bimalleolar ankle fracture.   Patient reports left ankle pain as mild.  Pain is improved in the left ankle from yesterday.  Patient on bedpan currently.  Objective:   VITALS:   Vitals:   08/22/21 0329 08/22/21 0736 08/22/21 1127 08/22/21 1151  BP: (!) 155/80 (!) 131/92  131/64  Pulse: 83 86  60  Resp: 18 14  18   Temp: 98.2 F (36.8 C) 98.5 F (36.9 C)  98.2 F (36.8 C)  TempSrc: Oral     SpO2: 96% 94% 98% 95%  Weight:      Height:        PHYSICAL EXAM: Left lower extremity: Left leg splint is in place.  Dressings are clean dry and intact.  Compartments are soft.  Patient has ability to flex and from his toes.  He is diminished sensation light touch in left toes due to baseline neuropathy.  His toes are well-perfused.   LABS  Results for orders placed or performed during the hospital encounter of 08/16/21 (from the past 24 hour(s))  Glucose, capillary     Status: Abnormal   Collection Time: 08/22/21  8:32 AM  Result Value Ref Range   Glucose-Capillary 111 (H) 70 - 99 mg/dL    No results found.  Assessment/Plan: 3 Days Post-Op   Principal Problem:   Closed left ankle fracture Active Problems:   GI bleeding  Patient progressing from an orthopedic standpoint.  Continue elevation left lower extremity.  Continue PT.  Patient is due for discharge to [facility].  Continue baseline Eliquis.  Patient is nonweightbearing on the left lower extremity for 6 to 8 weeks postop.  Patient will follow-up in my office in 2 weeks for reevaluation and x-ray.  Please call [facility] in [place] at [phone] to make patient an appointment for 10 to 14 days after discharge.    [phone] , MD 08/22/2021, 1:56 PM

## 2021-08-22 NOTE — TOC Progression Note (Signed)
Transition of Care [facility]) - Progression Note    Patient Details  Name: [name] MRN: [id number] Date of Birth: April 23, 1941  [facility]) CM/SW Contact  Marlowe Sax, RN Phone Number: 08/22/2021, 2:56 PM  Clinical Narrative:   Received Berkley Harvey approval for [facility] start date 11/29- 12/01         Expected Discharge Plan and Services                                                 Social Determinants of Health (SDOH) Interventions    Readmission Risk Interventions No flowsheet data found.

## 2021-08-22 NOTE — Progress Notes (Signed)
Occupational Therapy Treatment Patient Details Name: [name] MRN: [id number] DOB: 03/25/1941 Today's Date: 08/22/2021   History of present illness [name] is a 80 y.o. Caucasian male with medical history significant for hypertension, dyslipidemia, migraines, chronic atrial fibrillation, and chronic low back pain, who presented to the ER on 08/16/2021 with acute onset of fall with associated syncope and left ankle injury and subsequent pain.  The patient was walking back from her bathroom and suddenly experienced lightheadedness and diaphoresis then passed out falling to the floor. He was admitted forGI bleeding with acute blood loss anemia,  syncope secondary to symptomatic anemia with subsequent fall and left ankle fracture. He had blood transfusion. He has Afib.  On 08/19/2021 he underwent  OPEN REDUCTION INTERNAL FIXATION (ORIF) LEFT BIMALLEOLAR ANKLE FRACTURE. He is NWB on L LE.   OT comments  Pt seen for OT tx this date to f/u re: safety with ADLs/ADL mobility. OT engages pt in STS x3 trials with PT assitance for foot blocking and PT assisting with precaution/WB restriction maintenance. Pt requires MIN/MOD A +2 initially and MIN A +2 from slightly elevated bed height to SPS to chair. While seated before standing trials, OT engages pt in ed re: modified LB ADL techniques and he progresses slightly with good effort to contribute to dressing non-op R LE while in sitting with MAX/TOTAL A. Pt also educated re: sequence and rationale for safe use of RW. Pt left with all needs met and in reach. OT ed with pt and spouse re: gluteal contraction while seated/laying to increase strength and promote improved standing posture with pelvis centered under torso/core. Will continue to follow acutely. Continue to anticipate that pt will require rehabilitation services/OT f/u in STR setting.    Recommendations for follow up therapy are one component of a multi-disciplinary discharge planning  process, led by the attending physician.  Recommendations may be updated based on patient status, additional functional criteria and insurance authorization.    Follow Up Recommendations  Skilled nursing-short term rehab (<3 hours/day)    Assistance Recommended at Discharge Frequent or constant Supervision/Assistance  Equipment Recommendations  Other (comment) (defer)    Recommendations for Other Services      Precautions / Restrictions Precautions Precautions: Fall Restrictions Weight Bearing Restrictions: Yes LLE Weight Bearing: Non weight bearing       Mobility Bed Mobility Overal bed mobility: Needs Assistance Bed Mobility: Supine to Sit     Supine to sit: Min guard Sit to supine: Min assist   General bed mobility comments: increased time, HOB flattened, mild use of bed rails    Transfers Overall transfer level: Needs assistance Equipment used: None;Rolling walker (2 wheels) Transfers: Sit to/from Stand;Bed to chair/wheelchair/BSC Sit to Stand: Min assist;+2 physical assistance;From elevated surface Stand pivot transfers: Min assist;Mod assist;+2 physical assistance        Lateral/Scoot Transfers: Min guard General transfer comment: requires MIN/MOD A +2 from lower surface, MIN A +2 to STS from slightly elevated EOB surface. Primary issue is with maintaining  NWB precaution to L LE.     Balance Overall balance assessment: Needs assistance Sitting-balance support: Feet supported;Bilateral upper extremity supported Sitting balance-Leahy Scale: Good Sitting balance - Comments: G static sitting   Standing balance support: Bilateral upper extremity supported Standing balance-Leahy Scale: Poor Standing balance comment: dependent on RW for balance                           ADL  either performed or assessed with clinical judgement   ADL Overall ADL's : Needs assistance/impaired                     Lower Body Dressing: Maximal assistance;Total  assistance;Sitting/lateral leans Lower Body Dressing Details (indicate cue type and reason): makes some effort to pick up R LE while in EOB sitting to contribute to donning socks, but unsuccessful in cross-leg technique d/t limited ROM and unable to tolerate bending at the waist for this task as he becomes light headed with positional changes.                    Extremity/Trunk Assessment              Vision       Perception     Praxis      Cognition Arousal/Alertness: Awake/alert Behavior During Therapy: WFL for tasks assessed/performed Overall Cognitive Status: Within Functional Limits for tasks assessed                                            Exercises Other Exercises Other Exercises: OT engages pt in STS x3 trials wtih PT's assistance. with cues for hand placement and education re: safe use of RW.   Shoulder Instructions       General Comments      Pertinent Vitals/ Pain       Pain Assessment: No/denies pain Faces Pain Scale: Hurts a little bit Pain Location: left ankle when accidently puts pressure on it Pain Descriptors / Indicators: Discomfort Pain Intervention(s): Limited activity within patient's tolerance;Monitored during session;Repositioned  Home Living                                          Prior Functioning/Environment              Frequency  Min 2X/week        Progress Toward Goals  OT Goals(current goals can now be found in the care plan section)  Progress towards OT goals: Progressing toward goals  Acute Rehab OT Goals Patient Stated Goal: to go home OT Goal Formulation: With patient/family Time For Goal Achievement: 09/03/21 Potential to Achieve Goals: Fair  Plan Discharge plan remains appropriate    Co-evaluation    PT/OT/SLP Co-Evaluation/Treatment: Yes Reason for Co-Treatment: Complexity of the patient's impairments (multi-system involvement);For patient/therapist safety;To  address functional/ADL transfers PT goals addressed during session: Mobility/safety with mobility;Balance;Proper use of DME OT goals addressed during session: ADL's and self-care;Strengthening/ROM      AM-PAC OT "6 Clicks" Daily Activity     Outcome Measure   Help from another person eating meals?: None Help from another person taking care of personal grooming?: None Help from another person toileting, which includes using toliet, bedpan, or urinal?: Total Help from another person bathing (including washing, rinsing, drying)?: A Lot Help from another person to put on and taking off regular upper body clothing?: A Little Help from another person to put on and taking off regular lower body clothing?: Total 6 Click Score: 15    End of Session Equipment Utilized During Treatment: Rolling walker (2 wheels)  OT Visit Diagnosis: Unsteadiness on feet (R26.81);Repeated falls (R29.6);Muscle weakness (generalized) (M62.81)   Activity Tolerance Patient limited by fatigue  Patient Left in bed;with call bell/phone within reach;with bed alarm set;with family/visitor present   Nurse Communication Mobility status        Time: 6270-3500 OT Time Calculation (min): 32 min  Charges: OT General Charges $OT Visit: 1 Visit OT Treatments $Self Care/Home Management : 8-22 mins $Therapeutic Activity: 8-22 mins  Gerrianne Scale, [place], OTR/L ascom [phone] 08/22/21, 2:24 PM

## 2021-08-23 DIAGNOSIS — S82842D Displaced bimalleolar fracture of left lower leg, subsequent encounter for closed fracture with routine healing: Secondary | ICD-10-CM | POA: Diagnosis not present

## 2021-08-23 DIAGNOSIS — Z96652 Presence of left artificial knee joint: Secondary | ICD-10-CM | POA: Diagnosis not present

## 2021-08-23 DIAGNOSIS — N179 Acute kidney failure, unspecified: Secondary | ICD-10-CM | POA: Diagnosis not present

## 2021-08-23 DIAGNOSIS — I4891 Unspecified atrial fibrillation: Secondary | ICD-10-CM | POA: Diagnosis not present

## 2021-08-23 DIAGNOSIS — F0394 Unspecified dementia, unspecified severity, with anxiety: Secondary | ICD-10-CM | POA: Diagnosis not present

## 2021-08-23 DIAGNOSIS — N10 Acute tubulo-interstitial nephritis: Secondary | ICD-10-CM | POA: Diagnosis not present

## 2021-08-23 DIAGNOSIS — Z885 Allergy status to narcotic agent status: Secondary | ICD-10-CM | POA: Diagnosis not present

## 2021-08-23 DIAGNOSIS — E7800 Pure hypercholesterolemia, unspecified: Secondary | ICD-10-CM | POA: Diagnosis present

## 2021-08-23 DIAGNOSIS — R339 Retention of urine, unspecified: Secondary | ICD-10-CM | POA: Diagnosis not present

## 2021-08-23 DIAGNOSIS — A419 Sepsis, unspecified organism: Secondary | ICD-10-CM | POA: Diagnosis not present

## 2021-08-23 DIAGNOSIS — S82852A Displaced trimalleolar fracture of left lower leg, initial encounter for closed fracture: Secondary | ICD-10-CM | POA: Diagnosis not present

## 2021-08-23 DIAGNOSIS — A4151 Sepsis due to Escherichia coli [E. coli]: Secondary | ICD-10-CM | POA: Diagnosis not present

## 2021-08-23 DIAGNOSIS — R778 Other specified abnormalities of plasma proteins: Secondary | ICD-10-CM | POA: Diagnosis not present

## 2021-08-23 DIAGNOSIS — R509 Fever, unspecified: Secondary | ICD-10-CM | POA: Diagnosis not present

## 2021-08-23 DIAGNOSIS — I214 Non-ST elevation (NSTEMI) myocardial infarction: Secondary | ICD-10-CM | POA: Diagnosis not present

## 2021-08-23 DIAGNOSIS — M25552 Pain in left hip: Secondary | ICD-10-CM | POA: Diagnosis not present

## 2021-08-23 DIAGNOSIS — Z961 Presence of intraocular lens: Secondary | ICD-10-CM | POA: Diagnosis present

## 2021-08-23 DIAGNOSIS — M6281 Muscle weakness (generalized): Secondary | ICD-10-CM | POA: Diagnosis not present

## 2021-08-23 DIAGNOSIS — R197 Diarrhea, unspecified: Secondary | ICD-10-CM | POA: Diagnosis not present

## 2021-08-23 DIAGNOSIS — I4821 Permanent atrial fibrillation: Secondary | ICD-10-CM | POA: Diagnosis not present

## 2021-08-23 DIAGNOSIS — R102 Pelvic and perineal pain: Secondary | ICD-10-CM | POA: Diagnosis not present

## 2021-08-23 DIAGNOSIS — Z743 Need for continuous supervision: Secondary | ICD-10-CM | POA: Diagnosis not present

## 2021-08-23 DIAGNOSIS — Z8249 Family history of ischemic heart disease and other diseases of the circulatory system: Secondary | ICD-10-CM | POA: Diagnosis not present

## 2021-08-23 DIAGNOSIS — Z7901 Long term (current) use of anticoagulants: Secondary | ICD-10-CM | POA: Diagnosis not present

## 2021-08-23 DIAGNOSIS — N3090 Cystitis, unspecified without hematuria: Secondary | ICD-10-CM | POA: Diagnosis not present

## 2021-08-23 DIAGNOSIS — R55 Syncope and collapse: Secondary | ICD-10-CM | POA: Diagnosis not present

## 2021-08-23 DIAGNOSIS — R531 Weakness: Secondary | ICD-10-CM | POA: Diagnosis not present

## 2021-08-23 DIAGNOSIS — N390 Urinary tract infection, site not specified: Secondary | ICD-10-CM | POA: Diagnosis not present

## 2021-08-23 DIAGNOSIS — Z981 Arthrodesis status: Secondary | ICD-10-CM | POA: Diagnosis not present

## 2021-08-23 DIAGNOSIS — R0902 Hypoxemia: Secondary | ICD-10-CM | POA: Diagnosis not present

## 2021-08-23 DIAGNOSIS — R5381 Other malaise: Secondary | ICD-10-CM | POA: Diagnosis not present

## 2021-08-23 DIAGNOSIS — R2689 Other abnormalities of gait and mobility: Secondary | ICD-10-CM | POA: Diagnosis not present

## 2021-08-23 DIAGNOSIS — Z7401 Bed confinement status: Secondary | ICD-10-CM | POA: Diagnosis not present

## 2021-08-23 DIAGNOSIS — R278 Other lack of coordination: Secondary | ICD-10-CM | POA: Diagnosis not present

## 2021-08-23 DIAGNOSIS — R6520 Severe sepsis without septic shock: Secondary | ICD-10-CM | POA: Diagnosis not present

## 2021-08-23 DIAGNOSIS — I517 Cardiomegaly: Secondary | ICD-10-CM | POA: Diagnosis not present

## 2021-08-23 DIAGNOSIS — S82892A Other fracture of left lower leg, initial encounter for closed fracture: Secondary | ICD-10-CM | POA: Diagnosis not present

## 2021-08-23 DIAGNOSIS — W19XXXA Unspecified fall, initial encounter: Secondary | ICD-10-CM | POA: Diagnosis not present

## 2021-08-23 DIAGNOSIS — R4182 Altered mental status, unspecified: Secondary | ICD-10-CM | POA: Diagnosis not present

## 2021-08-23 DIAGNOSIS — F05 Delirium due to known physiological condition: Secondary | ICD-10-CM | POA: Diagnosis not present

## 2021-08-23 DIAGNOSIS — S8292XA Unspecified fracture of left lower leg, initial encounter for closed fracture: Secondary | ICD-10-CM | POA: Diagnosis not present

## 2021-08-23 DIAGNOSIS — I10 Essential (primary) hypertension: Secondary | ICD-10-CM | POA: Diagnosis not present

## 2021-08-23 DIAGNOSIS — Z9841 Cataract extraction status, right eye: Secondary | ICD-10-CM | POA: Diagnosis not present

## 2021-08-23 DIAGNOSIS — R195 Other fecal abnormalities: Secondary | ICD-10-CM | POA: Diagnosis not present

## 2021-08-23 DIAGNOSIS — R112 Nausea with vomiting, unspecified: Secondary | ICD-10-CM | POA: Diagnosis not present

## 2021-08-23 DIAGNOSIS — K922 Gastrointestinal hemorrhage, unspecified: Secondary | ICD-10-CM | POA: Diagnosis not present

## 2021-08-23 DIAGNOSIS — Z20822 Contact with and (suspected) exposure to covid-19: Secondary | ICD-10-CM | POA: Diagnosis not present

## 2021-08-23 DIAGNOSIS — N139 Obstructive and reflux uropathy, unspecified: Secondary | ICD-10-CM | POA: Diagnosis not present

## 2021-08-23 DIAGNOSIS — D649 Anemia, unspecified: Secondary | ICD-10-CM | POA: Diagnosis not present

## 2021-08-23 DIAGNOSIS — R110 Nausea: Secondary | ICD-10-CM | POA: Diagnosis not present

## 2021-08-23 DIAGNOSIS — G40909 Epilepsy, unspecified, not intractable, without status epilepticus: Secondary | ICD-10-CM | POA: Diagnosis not present

## 2021-08-23 DIAGNOSIS — R300 Dysuria: Secondary | ICD-10-CM | POA: Diagnosis not present

## 2021-08-23 DIAGNOSIS — N12 Tubulo-interstitial nephritis, not specified as acute or chronic: Secondary | ICD-10-CM | POA: Diagnosis not present

## 2021-08-23 DIAGNOSIS — Z888 Allergy status to other drugs, medicaments and biological substances status: Secondary | ICD-10-CM | POA: Diagnosis not present

## 2021-08-23 DIAGNOSIS — N200 Calculus of kidney: Secondary | ICD-10-CM | POA: Diagnosis not present

## 2021-08-23 DIAGNOSIS — K2100 Gastro-esophageal reflux disease with esophagitis, without bleeding: Secondary | ICD-10-CM | POA: Diagnosis not present

## 2021-08-23 DIAGNOSIS — Z79899 Other long term (current) drug therapy: Secondary | ICD-10-CM | POA: Diagnosis not present

## 2021-08-23 DIAGNOSIS — N3289 Other specified disorders of bladder: Secondary | ICD-10-CM | POA: Diagnosis not present

## 2021-08-23 DIAGNOSIS — K219 Gastro-esophageal reflux disease without esophagitis: Secondary | ICD-10-CM | POA: Diagnosis present

## 2021-08-23 LAB — RESP PANEL BY RT-PCR (FLU A&B, COVID) ARPGX2
Influenza A by PCR: NEGATIVE
Influenza B by PCR: NEGATIVE
SARS Coronavirus 2 by RT PCR: NEGATIVE

## 2021-08-23 MED ORDER — SENNA 8.6 MG PO TABS: 1.0000 | ORAL_TABLET | Freq: Two times a day (BID) | ORAL | 0 refills | Status: DC

## 2021-08-23 MED ORDER — HYDROCODONE-ACETAMINOPHEN 5-325 MG PO TABS: 1.0000 | ORAL_TABLET | Freq: Four times a day (QID) | ORAL | 0 refills | Status: DC | PRN

## 2021-08-23 MED ORDER — METHOCARBAMOL 500 MG PO TABS: 500.0000 mg | ORAL_TABLET | Freq: Three times a day (TID) | ORAL | Status: DC | PRN

## 2021-08-23 MED ORDER — METHOCARBAMOL 500 MG PO TABS: 500.0000 mg | ORAL_TABLET | Freq: Three times a day (TID) | ORAL | Status: AC | PRN

## 2021-08-23 MED ORDER — ACETAMINOPHEN 325 MG PO TABS
325.0000 mg | ORAL_TABLET | Freq: Four times a day (QID) | ORAL | Status: AC | PRN
Start: 2021-08-23 — End: ?

## 2021-08-23 MED ORDER — DOCUSATE SODIUM 100 MG PO CAPS: 100.0000 mg | ORAL_CAPSULE | Freq: Two times a day (BID) | ORAL | 0 refills | Status: DC

## 2021-08-23 NOTE — TOC Progression Note (Signed)
Transition of Care [facility]) - Progression Note    Patient Details  Name: [name] MRN: [id number] Date of Birth: [birth year]  Transition of Care [facility]) CM/SW Contact  Marlowe Sax, RN Phone Number: 08/23/2021, 10:03 AM  Clinical Narrative:   Berkley Harvey approved to go to [facility], start date 11/29 next review 12/1 ref number [id number]         Expected Discharge Plan and Services                                                 Social Determinants of Health (SDOH) Interventions    Readmission Risk Interventions No flowsheet data found.

## 2021-08-23 NOTE — TOC Progression Note (Signed)
Transition of Care [facility]) - Progression Note    Patient Details  Name: [name] MRN: [id number] Date of Birth: Aug 24, 1941  Transition of Care [facility]) CM/SW Contact  Marlowe Sax, RN Phone Number: 08/23/2021, 1:06 PM  Clinical Narrative:   Patient going to [facility] room 1104B, Called EMS for transport, Spouse [name] is aware of transport, there are 2 ahead of him         Expected Discharge Plan and Services           Expected Discharge Date: 08/23/21                                     Social Determinants of Health (SDOH) Interventions    Readmission Risk Interventions No flowsheet data found.

## 2021-08-23 NOTE — Plan of Care (Signed)
  Problem: Health Behavior/Discharge Planning: Goal: Ability to manage health-related needs will improve Outcome: Adequate for Discharge   Problem: Clinical Measurements: Goal: Ability to maintain clinical measurements within normal limits will improve Outcome: Adequate for Discharge Goal: Will remain free from infection Outcome: Adequate for Discharge Goal: Diagnostic test results will improve Outcome: Adequate for Discharge Goal: Respiratory complications will improve Outcome: Adequate for Discharge Goal: Cardiovascular complication will be avoided Outcome: Adequate for Discharge   Problem: Activity: Goal: Risk for activity intolerance will decrease Outcome: Adequate for Discharge   Problem: Nutrition: Goal: Adequate nutrition will be maintained Outcome: Adequate for Discharge   Problem: Coping: Goal: Level of anxiety will decrease Outcome: Adequate for Discharge   Problem: Elimination: Goal: Will not experience complications related to bowel motility Outcome: Adequate for Discharge Goal: Will not experience complications related to urinary retention Outcome: Adequate for Discharge   Problem: Pain Managment: Goal: General experience of comfort will improve Outcome: Adequate for Discharge   Problem: Safety: Goal: Ability to remain free from injury will improve Outcome: Adequate for Discharge   Problem: Skin Integrity: Goal: Risk for impaired skin integrity will decrease Outcome: Adequate for Discharge   Problem: Education: Goal: Ability to identify signs and symptoms of gastrointestinal bleeding will improve Outcome: Adequate for Discharge   Problem: Bowel/Gastric: Goal: Will show no signs and symptoms of gastrointestinal bleeding Outcome: Adequate for Discharge   Problem: Fluid Volume: Goal: Will show no signs and symptoms of excessive bleeding Outcome: Adequate for Discharge   Problem: Clinical Measurements: Goal: Complications related to the disease  process, condition or treatment will be avoided or minimized Outcome: Adequate for Discharge   

## 2021-08-23 NOTE — Progress Notes (Signed)
  Subjective:  POD #4 s/p ORIF of left bimalleolar ankle fracture.   Patient reports left ankle pain as mild to moderate.  Patient having intermittent significant muscle spasms.  Patient is more drowsy after starting Flexeril.  His family is at the bedside.  Objective:   VITALS:   Vitals:   08/22/21 2042 08/23/21 0540 08/23/21 0800 08/23/21 1138  BP: (!) 149/89 (!) 147/88 (!) 142/82 138/83  Pulse: 63 76 75 83  Resp: 20 20 18 18   Temp: 98.2 F (36.8 C) 98.9 F (37.2 C) 98.7 F (37.1 C) 98.3 F (36.8 C)  TempSrc:    Oral  SpO2: 92% 96% 91% 95%  Weight:      Height:        PHYSICAL EXAM: Left lower extremity Left ankle splint and dressing are clean dry and intact.  Patient's toes are well-perfused.  He can flex and extend his toes.  He has intact sensation light touch but this is diminished compared to his right foot due to baseline peripheral neuropathy.  LABS  Results for orders placed or performed during the hospital encounter of 08/16/21 (from the past 24 hour(s))  Resp Panel by RT-PCR (Flu A&B, Covid) Nasopharyngeal Swab     Status: None   Collection Time: 08/23/21 10:27 AM   Specimen: Nasopharyngeal Swab; Nasopharyngeal(NP) swabs in vial transport medium  Result Value Ref Range   SARS Coronavirus 2 by RT PCR NEGATIVE NEGATIVE   Influenza A by PCR NEGATIVE NEGATIVE   Influenza B by PCR NEGATIVE NEGATIVE    No results found.  Assessment/Plan: 4 Days Post-Op   Principal Problem:   Closed left ankle fracture Active Problems:   Occult GI bleeding  Patient has made expected progress with physical therapy.  Continue with physical therapy.  Patient will remain nonweightbearing on the left lower extremity until follow-up with me in 10 to 14 days.  Patient will continue Eliquis for DVT prophylaxis.  Patient will be discharged to a skilled nursing facility today.    08/25/21 , MD 08/23/2021, 1:49 PM

## 2021-08-23 NOTE — Discharge Summary (Addendum)
Physician Discharge Summary  [name] [id number] DOB: [birth year] DOA: 08/16/2021  PCP: [name], MD  Admit date: 08/16/2021 Discharge date: 08/23/2021  Discharge disposition: SNF   Recommendations for Outpatient Follow-Up:   Follow-up with physician at the nursing home within 3 days of discharge Follow-up with Dr. Mack Guise, orthopedic surgeon, in 10 days Follow-up with Dr. Haig Prophet, gastroenterologist, in 1 month   Discharge Diagnosis:   Principal Problem:   Closed left ankle fracture Active Problems:   Occult GI bleeding    Discharge Condition: Stable.  Diet recommendation:  Diet Order             Diet - low sodium heart healthy           Diet regular Room service appropriate? Yes; Fluid consistency: Thin  Diet effective now                     Code Status: Full Code     Hospital Course:   Mr. [name] is a 80 y.o. male with medical history significant for left lower extremity DVT in 2011, subarachnoid hemorrhage in 2010, hypertension, dyslipidemia, migraine, chronic atrial fibrillation, chronic low back pain, who presented to the hospital because of syncope leading to a fall and left ankle injury.  Stool for occult blood was positive and he was anemic.  He was admitted to the hospital for left ankle fracture, acute blood loss anemia and positive heme stools/occult GI bleeding.  He was treated with analgesics, IV Protonix and he was transfused with 2 units of packed red blood cells.  He was on Eliquis and this was held temporarily.He did not have any overt GI bleeding so gastroenterologist recommended conservative management and outpatient follow-up.  He was evaluated by the orthopedic surgeon and he underwent ORIF bimalleolar left ankle fracture.  His condition slowly improved.  He was evaluated by PT and OT who recommended further rehabilitation at the skilled nursing facility.  He is deemed stable for discharge to SNF today.   Discharge plan was discussed with the patient, his wife and son at the bedside.    Medical Consultants:   Orthopedic surgeon Gastroenterologist   Discharge Exam:    Vitals:   08/22/21 1625 08/22/21 2042 08/23/21 0540 08/23/21 0800  BP: (!) 178/89 (!) 149/89 (!) 147/88 (!) 142/82  Pulse: 77 63 76 75  Resp: 19 20 20 18   Temp: 98.5 F (36.9 C) 98.2 F (36.8 C) 98.9 F (37.2 C) 98.7 F (37.1 C)  TempSrc: Oral     SpO2: 94% 92% 96% 91%  Weight:      Height:         GEN: NAD SKIN: Warm and dry EYES: No pallor or icterus ENT: MMM CV: RRR PULM: CTA B ABD: soft, ND, NT, +BS CNS: AAO x 3, non focal EXT: Cast on eft lower extremity   The results of significant diagnostics from this hospitalization (including imaging, microbiology, ancillary and laboratory) are listed below for reference.     Procedures and Diagnostic Studies:   DG Chest 2 View  Result Date: 08/16/2021 CLINICAL DATA:  80 year old male with a history of syncope EXAM: CHEST - 2 VIEW COMPARISON:  02/17/2019 FINDINGS: The cardiac diameter appears enlarged compared to the prior, with more globular configuration and straightening of the left heart border. Stigmata of emphysema, with increased retrosternal airspace, flattened hemidiaphragms, increased AP diameter, and hyperinflation on the AP view. Hazy opacity at the bilateral lung bases with partial obscuration  of the hemidiaphragms and the heart borders. Coarsened interstitial markings bilaterally. No pneumothorax. Degenerative changes of the spine.  No acute displaced fracture IMPRESSION: Increased diameter of the heart with a configuration suspicious for pericardial effusion. Correlation with either chest CT or cardiac echo may be considered. Opacities at the bilateral lung bases, likely a combination of pleural effusion and associated atelectasis/consolidation. Coarsened interstitial markings, potentially early pulmonary edema Electronically Signed   By: Corrie Mckusick D.O.   On: 08/16/2021 15:09   DG Tibia/Fibula Left  Result Date: 08/16/2021 CLINICAL DATA:  Pain after fall. EXAM: LEFT TIBIA AND FIBULA - 2 VIEW; LEFT FOOT - COMPLETE 3+ VIEW COMPARISON:  X-ray left tibia fibula and left femur 01/30/2019 FINDINGS: Right foot: No evidence of fracture, dislocation, or joint effusion. No evidence of severe arthropathy. No aggressive appearing focal bone abnormality. Subcutaneus soft tissue edema. Right tibia fibula: Acute laterally displaced fracture of the transsyndesmotic distal fibula. Acute laterally displaced medial malleolar fracture. No definite dislocation of the left ankle. Cortical irregularity of the intra-articular anterior tibia. No acute displaced fracture or dislocation of the proximal tibia fibula. The knee is grossly unremarkable. Total left knee arthroplasty. Associated subcutaneus soft tissue edema. IMPRESSION: 1. Acute laterally displaced fracture of the transsyndesmotic distal fibula. 2. Acute laterally displaced medial malleolar fracture. 3. Cortical irregularity of the intra-articular anterior tibia. Recommend dedicated left ankle radiograph. 4. No acute displaced fracture or dislocation of the proximal tibia fibula in the setting of a total left knee arthroplasty. 5. No acute displaced fracture or dislocation of the bones of the left foot. Electronically Signed   By: Iven Finn M.D.   On: 08/16/2021 15:11   DG Ankle Complete Left  Result Date: 08/16/2021 CLINICAL DATA:  Radiologist recommended dedicated ankle film to further evaluate injury EXAM: LEFT ANKLE COMPLETE - 3+ VIEW COMPARISON:  Same day radiographs. FINDINGS: Redemonstrated acute laterally displaced oblique fracture of the distal fibula involving the syndesmosis. Redemonstrated acute laterally displaced medial malleolar fracture. Similar ill-defined cortical regularity of the intra-articular anterior tibia. Possible small joint effusion. Vascular calcifications. Calcaneal  enthesophytes. IMPRESSION: 1. Redemonstrated acute laterally displaced oblique fracture of the distal fibula involving the syndesmosis. 2. Redemonstrated acute laterally displaced medial malleolar fracture. 3. Similar ill-defined cortical regularity of the intra-articular anterior tibia, possibly representing fracture given trauma and small effusion. CT of the ankle could confirm and better evaluate if clinically indicated. Electronically Signed   By: Margaretha Sheffield M.D.   On: 08/16/2021 16:20   CT Head Wo Contrast  Result Date: 08/16/2021 CLINICAL DATA:  Fall, head trauma EXAM: CT HEAD WITHOUT CONTRAST TECHNIQUE: Contiguous axial images were obtained from the base of the skull through the vertex without intravenous contrast. COMPARISON:  CT head 08/06/2019 FINDINGS: Brain: No acute intracranial hemorrhage, mass effect, or herniation. No extra-axial fluid collections. No evidence of acute territorial infarct. No hydrocephalus. Mild-to-moderate cortical volume loss. Patchy hypodensities in the periventricular and subcortical white matter, likely secondary to chronic microvascular ischemic changes. Vascular: Calcified plaques in the carotid siphons. Skull: Normal. Negative for fracture or focal lesion. Sinuses/Orbits: No acute finding. Other: None. IMPRESSION: Chronic changes with no acute intracranial process identified. Electronically Signed   By: Ofilia Neas M.D.   On: 08/16/2021 14:41   CT Angio Chest PE W and/or Wo Contrast  Result Date: 08/16/2021 CLINICAL DATA:  Witnessed fall, dizziness, anemia EXAM: CT ANGIOGRAPHY CHEST CT ABDOMEN AND PELVIS WITH CONTRAST TECHNIQUE: Multidetector CT imaging of the chest was performed using the standard protocol  during bolus administration of intravenous contrast. Multiplanar CT image reconstructions and MIPs were obtained to evaluate the vascular anatomy. Multidetector CT imaging of the abdomen and pelvis was performed using the standard protocol during  bolus administration of intravenous contrast. CONTRAST:  171mL OMNIPAQUE IOHEXOL 350 MG/ML SOLN COMPARISON:  08/16/2021 FINDINGS: CTA CHEST FINDINGS Cardiovascular: This is a technically adequate evaluation of the pulmonary vasculature. No filling defects or pulmonary emboli. The heart is enlarged, with prominent biatrial dilation. No pericardial effusion. Extensive atherosclerosis of the coronary vasculature. No evidence of thoracic aortic aneurysm or dissection. Mild aortic atherosclerosis. Mediastinum/Nodes: No enlarged mediastinal, hilar, or axillary lymph nodes. Thyroid gland, trachea, and esophagus demonstrate no significant findings. Lungs/Pleura: No airspace disease, effusion, or pneumothorax. Central airways are patent. Musculoskeletal: No acute displaced fractures. Reconstructed images demonstrate no additional findings. Review of the MIP images confirms the above findings. CT ABDOMEN and PELVIS FINDINGS Hepatobiliary: No focal liver abnormality is seen. No gallstones, gallbladder wall thickening, or biliary dilatation. Pancreas: Unremarkable. No pancreatic ductal dilatation or surrounding inflammatory changes. Spleen: Normal in size without focal abnormality. 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 Mild nonspecific thickening of the left adrenal gland measuring up to 1.4 cm. 4 mm nonobstructing calculus mid right kidney. 2 mm nonobstructing calculus lower pole left kidney. There is mild bilateral renal cortical thinning. No obstructive uropathy. The bladder is unremarkable. Stomach/Bowel: No bowel obstruction or ileus. Normal appendix right lower quadrant. No bowel wall thickening or inflammatory change. Vascular/Lymphatic: Aortic atherosclerosis. No enlarged abdominal or pelvic lymph nodes. Reproductive: Prostate is unremarkable. Other: No free fluid or free gas.  No abdominal wall hernia. Musculoskeletal: No acute displaced fractures. Postsurgical changes at L4-5. Reconstructed images  demonstrate no additional findings. Review of the MIP images confirms the above findings. IMPRESSION: 1. No evidence of pulmonary embolus. 2. Cardiomegaly, with no evidence of pericardial effusion. 3. No acute intrathoracic, intra-abdominal, or intrapelvic trauma. 4. Punctate bilateral nonobstructing renal calculi. 5.  Aortic Atherosclerosis (ICD10-I70.0). Electronically Signed   By: Randa Ngo M.D.   On: 08/16/2021 16:04   CT Cervical Spine Wo Contrast  Result Date: 08/16/2021 CLINICAL DATA:  Neck pain EXAM: CT CERVICAL SPINE WITHOUT CONTRAST TECHNIQUE: Multidetector CT imaging of the cervical spine was performed without intravenous contrast. Multiplanar CT image reconstructions were also generated. COMPARISON:  MRI cervical spine 08/18/2014 FINDINGS: Alignment: Grade 1 anterolisthesis of C3 on C4 and C4 on C5. Skull base and vertebrae: No acute fracture. No primary bone lesion or focal pathologic process. Soft tissues and spinal canal: No prevertebral fluid or swelling. No visible canal hematoma. Disc levels: Moderate intervertebral disc height loss at C5-C6 and mild-to-moderate at C6-C7. Dorsal endplate osteophytes and uncovertebral spurring most prominent at C5-C6. Facet arthropathy which is worse on the right. Multilevel neural foraminal narrowing most significant at C5-C6. Upper chest: No acute process identified. Other: None. IMPRESSION: Chronic degenerative changes with no acute fracture or subluxation identified. Electronically Signed   By: Ofilia Neas M.D.   On: 08/16/2021 14:44   CT Abdomen Pelvis W Contrast  Result Date: 08/16/2021 CLINICAL DATA:  Witnessed fall, dizziness, anemia EXAM: CT ANGIOGRAPHY CHEST CT ABDOMEN AND PELVIS WITH CONTRAST TECHNIQUE: Multidetector CT imaging of the chest was performed using the standard protocol during bolus administration of intravenous contrast. Multiplanar CT image reconstructions and MIPs were obtained to evaluate the vascular anatomy.  Multidetector CT imaging of the abdomen and pelvis was performed using the standard protocol during bolus administration of intravenous contrast. CONTRAST:  193mL OMNIPAQUE IOHEXOL  350 MG/ML SOLN COMPARISON:  08/16/2021 FINDINGS: CTA CHEST FINDINGS Cardiovascular: This is a technically adequate evaluation of the pulmonary vasculature. No filling defects or pulmonary emboli. The heart is enlarged, with prominent biatrial dilation. No pericardial effusion. Extensive atherosclerosis of the coronary vasculature. No evidence of thoracic aortic aneurysm or dissection. Mild aortic atherosclerosis. Mediastinum/Nodes: No enlarged mediastinal, hilar, or axillary lymph nodes. Thyroid gland, trachea, and esophagus demonstrate no significant findings. Lungs/Pleura: No airspace disease, effusion, or pneumothorax. Central airways are patent. Musculoskeletal: No acute displaced fractures. Reconstructed images demonstrate no additional findings. Review of the MIP images confirms the above findings. CT ABDOMEN and PELVIS FINDINGS Hepatobiliary: No focal liver abnormality is seen. No gallstones, gallbladder wall thickening, or biliary dilatation. Pancreas: Unremarkable. No pancreatic ductal dilatation or surrounding inflammatory changes. Spleen: Normal in size without focal abnormality. 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 Mild nonspecific thickening of the left adrenal gland measuring up to 1.4 cm. 4 mm nonobstructing calculus mid right kidney. 2 mm nonobstructing calculus lower pole left kidney. There is mild bilateral renal cortical thinning. No obstructive uropathy. The bladder is unremarkable. Stomach/Bowel: No bowel obstruction or ileus. Normal appendix right lower quadrant. No bowel wall thickening or inflammatory change. Vascular/Lymphatic: Aortic atherosclerosis. No enlarged abdominal or pelvic lymph nodes. Reproductive: Prostate is unremarkable. Other: No free fluid or free gas.  No abdominal wall  hernia. Musculoskeletal: No acute displaced fractures. Postsurgical changes at L4-5. Reconstructed images demonstrate no additional findings. Review of the MIP images confirms the above findings. IMPRESSION: 1. No evidence of pulmonary embolus. 2. Cardiomegaly, with no evidence of pericardial effusion. 3. No acute intrathoracic, intra-abdominal, or intrapelvic trauma. 4. Punctate bilateral nonobstructing renal calculi. 5.  Aortic Atherosclerosis (ICD10-I70.0). Electronically Signed   By: Randa Ngo M.D.   On: 08/16/2021 16:04   DG Foot Complete Left  Result Date: 08/16/2021 CLINICAL DATA:  Pain after fall. EXAM: LEFT TIBIA AND FIBULA - 2 VIEW; LEFT FOOT - COMPLETE 3+ VIEW COMPARISON:  X-ray left tibia fibula and left femur 01/30/2019 FINDINGS: Right foot: No evidence of fracture, dislocation, or joint effusion. No evidence of severe arthropathy. No aggressive appearing focal bone abnormality. Subcutaneus soft tissue edema. Right tibia fibula: Acute laterally displaced fracture of the transsyndesmotic distal fibula. Acute laterally displaced medial malleolar fracture. No definite dislocation of the left ankle. Cortical irregularity of the intra-articular anterior tibia. No acute displaced fracture or dislocation of the proximal tibia fibula. The knee is grossly unremarkable. Total left knee arthroplasty. Associated subcutaneus soft tissue edema. IMPRESSION: 1. Acute laterally displaced fracture of the transsyndesmotic distal fibula. 2. Acute laterally displaced medial malleolar fracture. 3. Cortical irregularity of the intra-articular anterior tibia. Recommend dedicated left ankle radiograph. 4. No acute displaced fracture or dislocation of the proximal tibia fibula in the setting of a total left knee arthroplasty. 5. No acute displaced fracture or dislocation of the bones of the left foot. Electronically Signed   By: Iven Finn M.D.   On: 08/16/2021 15:11     Labs:   Basic Metabolic  Panel: Recent Labs  Lab 08/16/21 1411 08/17/21 0420 08/20/21 0551 08/21/21 0512  NA 136 137 135 135  K 3.9 4.7 4.1 4.1  CL 105 106 106 106  CO2 26 27 24 23   GLUCOSE 120* 86 120* 109*  BUN 23 19 15 17   CREATININE 1.25* 0.99 0.86 0.89  CALCIUM 8.8* 8.7* 8.7* 8.8*   GFR Estimated Creatinine Clearance: 80.9 mL/min (by C-G formula based on SCr of 0.89 mg/dL). Liver Function  Tests: Recent Labs  Lab 08/16/21 1411  AST 16  ALT 9  ALKPHOS 59  BILITOT 0.6  PROT 6.7  ALBUMIN 3.8   No results for input(s): LIPASE, AMYLASE in the last 168 hours. No results for input(s): AMMONIA in the last 168 hours. Coagulation profile Recent Labs  Lab 08/16/21 1411  INR 1.4*    CBC: Recent Labs  Lab 08/16/21 1411 08/16/21 2206 08/17/21 0420 08/17/21 0947 08/17/21 1434 08/17/21 2120 08/19/21 0336 08/20/21 0551 08/21/21 0512  WBC 7.4  --  10.1  --   --   --  7.9 10.8* 9.9  NEUTROABS 6.2  --   --   --   --   --  5.5 8.5* 7.1  HGB 7.3*   < > 7.6*   < > 8.8* 8.3* 8.1* 8.2* 8.7*  HCT 25.3*   < > 25.5*   < > 29.2* 27.5* 26.8* 27.5* 28.6*  MCV 75.7*  --  75.2*  --   --   --  76.1* 76.8* 77.7*  PLT 182  --  181  --   --   --  166 170 168   < > = values in this interval not displayed.   Cardiac Enzymes: No results for input(s): CKTOTAL, CKMB, CKMBINDEX, TROPONINI in the last 168 hours. BNP: Invalid input(s): POCBNP CBG: Recent Labs  Lab 08/16/21 1405 08/22/21 0832  GLUCAP 116* 111*   D-Dimer No results for input(s): DDIMER in the last 72 hours. Hgb A1c No results for input(s): HGBA1C in the last 72 hours. Lipid Profile No results for input(s): CHOL, HDL, LDLCALC, TRIG, CHOLHDL, LDLDIRECT in the last 72 hours. Thyroid function studies No results for input(s): TSH, T4TOTAL, T3FREE, THYROIDAB in the last 72 hours.  Invalid input(s): FREET3 Anemia work up No results for input(s): VITAMINB12, FOLATE, FERRITIN, TIBC, IRON, RETICCTPCT in the last 72 hours. Microbiology Recent  Results (from the past 240 hour(s))  Resp Panel by RT-PCR (Flu A&B, Covid) Nasopharyngeal Swab     Status: None   Collection Time: 08/16/21  6:24 PM   Specimen: Nasopharyngeal Swab; Nasopharyngeal(NP) swabs in vial transport medium  Result Value Ref Range Status   SARS Coronavirus 2 by RT PCR NEGATIVE NEGATIVE Final    Comment: (NOTE) SARS-CoV-2 target nucleic acids are NOT DETECTED.  The SARS-CoV-2 RNA is generally detectable in upper respiratory specimens during the acute phase of infection. The lowest concentration of SARS-CoV-2 viral copies this assay can detect is 138 copies/mL. A negative result does not preclude SARS-Cov-2 infection and should not be used as the sole basis for treatment or other patient management decisions. A negative result may occur with  improper specimen collection/handling, submission of specimen other than nasopharyngeal swab, presence of viral mutation(s) within the areas targeted by this assay, and inadequate number of viral copies(<138 copies/mL). A negative result must be combined with clinical observations, patient history, and epidemiological information. The expected result is Negative.  Fact Sheet for Patients:  EntrepreneurPulse.com.au  Fact Sheet for Healthcare Providers:  IncredibleEmployment.be  This test is no t yet approved or cleared by the [place] FDA and  has been authorized for detection and/or diagnosis of SARS-CoV-2 by FDA under an Emergency Use Authorization (EUA). This EUA will remain  in effect (meaning this test can be used) for the duration of the COVID-19 declaration under Section 564(b)(1) of the Act, 21 U.S.C.section 360bbb-3(b)(1), unless the authorization is terminated  or revoked sooner.       Influenza A by  PCR NEGATIVE NEGATIVE Final   Influenza B by PCR NEGATIVE NEGATIVE Final    Comment: (NOTE) The Xpert Xpress SARS-CoV-2/FLU/RSV plus assay is intended as an aid in the  diagnosis of influenza from Nasopharyngeal swab specimens and should not be used as a sole basis for treatment. Nasal washings and aspirates are unacceptable for Xpert Xpress SARS-CoV-2/FLU/RSV testing.  Fact Sheet for Patients: BloggerCourse.com  Fact Sheet for Healthcare Providers: SeriousBroker.it  This test is not yet approved or cleared by the [place] FDA and has been authorized for detection and/or diagnosis of SARS-CoV-2 by FDA under an Emergency Use Authorization (EUA). This EUA will remain in effect (meaning this test can be used) for the duration of the COVID-19 declaration under Section 564(b)(1) of the Act, 21 U.S.C. section 360bbb-3(b)(1), unless the authorization is terminated or revoked.  Performed at [facility], [address] [place], [place] [address]   Surgical PCR screen     Status: Abnormal   Collection Time: 08/18/21  2:17 PM   Specimen: Nasal Mucosa; Nasal Swab  Result Value Ref Range Status   MRSA, PCR NEGATIVE NEGATIVE Final   Staphylococcus aureus POSITIVE (A) NEGATIVE Final    Comment: (NOTE) The Xpert SA Assay (FDA approved for NASAL specimens in patients 48 years of age and older), is one component of a comprehensive surveillance program. It is not intended to diagnose infection nor to guide or monitor treatment. Performed at [facility], [address], [place], [place] [address]      Discharge Instructions:   Discharge Instructions     Diet - low sodium heart healthy   Complete by: As directed    Discharge wound care:   Complete by: As directed    Follow up with Dr. Martha Clan, in 10 days   Increase activity slowly   Complete by: As directed       Allergies as of 08/23/2021       Reactions   Codeine Nausea Only, Other (See Comments)   Fentanyl Other (See Comments)   Given before knee surgery, blood pressure became extremely low   Regadenoson Other  (See Comments)   Created cardiac arrest   Verapamil Other (See Comments)   Per patient: Medication has caused low heart rate and caused patient to have afib that he currently has        Medication List     STOP taking these medications    amLODipine 2.5 MG tablet Commonly known as: NORVASC       TAKE these medications    acetaminophen 325 MG tablet Commonly known as: TYLENOL Take 1-2 tablets (325-650 mg total) by mouth every 6 (six) hours as needed for mild pain (pain score 1-3 or temp > 100.5).   aliskiren 300 MG tablet Commonly known as: TEKTURNA Take 300 mg by mouth daily.   amoxicillin 500 MG tablet Commonly known as: AMOXIL Take 500 mg by mouth as directed. Dental procedure only   carbamazepine 200 MG tablet Commonly known as: TEGRETOL Take 200 mg by mouth 2 (two) times a day.   docusate sodium 100 MG capsule Commonly known as: COLACE Take 1 capsule (100 mg total) by mouth 2 (two) times daily.   doxazosin 1 MG tablet Commonly known as: CARDURA Take 1 mg by mouth at bedtime.   Eliquis 5 MG Tabs tablet Generic drug: apixaban Take 5 mg by mouth 2 (two) times daily.   Fish Oil 1200 MG Caps Take 1,200 mg by mouth daily.  furosemide 20 MG tablet Commonly known as: LASIX Take 2 tablets (40 mg total) by mouth daily.   HYDROcodone-acetaminophen 5-325 MG tablet Commonly known as: NORCO/VICODIN Take 1 tablet by mouth every 6 (six) hours as needed for moderate pain.   irbesartan 300 MG tablet Commonly known as: AVAPRO Take 300 mg by mouth daily.   methocarbamol 500 MG tablet Commonly known as: Robaxin Take 1 tablet (500 mg total) by mouth every 8 (eight) hours as needed for up to 7 days for muscle spasms.   pantoprazole 40 MG tablet Commonly known as: PROTONIX Take 40 mg by mouth at bedtime.   senna 8.6 MG Tabs tablet Commonly known as: SENOKOT Take 1 tablet (8.6 mg total) by mouth 2 (two) times daily.   simvastatin 20 MG tablet Commonly known  as: ZOCOR Take 20 mg by mouth 2 (two) times daily.   vitamin B-12 1000 MCG tablet Commonly known as: CYANOCOBALAMIN Take 1,000 mcg by mouth daily.               Discharge Care Instructions  (From admission, onward)           Start     Ordered   08/23/21 0000  Discharge wound care:       Comments: Follow up with Dr. Mack Guise, in 10 days   08/23/21 1011            Contact information for follow-up providers     Thornton Park, MD. Schedule an appointment as soon as possible for a visit on 09/02/2021.   Specialty: Orthopedic Surgery Why: at 2pm Contact information: [place] [address] [phone]              Contact information for after-discharge care     Destination     [facility] .   Service: Skilled Nursing Contact information: [address] [place] [phone]                       If you experience worsening of your admission symptoms, develop shortness of breath, life threatening emergency, suicidal or homicidal thoughts you must seek medical attention immediately by calling 911 or calling your MD immediately  if symptoms less severe.   You must read complete instructions/literature along with all the possible adverse reactions/side effects for all the medicines you take and that have been prescribed to you. Take any new medicines after you have completely understood and accept all the possible adverse reactions/side effects.    Please note   You were cared for by a hospitalist during your hospital stay. If you have any questions about your discharge medications or the care you received while you were in the hospital after you are discharged, you can call the unit and asked to speak with the hospitalist on call if the hospitalist that took care of you is not available. Once you are discharged, your primary care physician will handle any further medical  issues. Please note that NO REFILLS for any discharge medications will be authorized once you are discharged, as it is imperative that you return to your primary care physician (or establish a relationship with a primary care physician if you do not have one) for your aftercare needs so that they can reassess your need for medications and monitor your lab values.       Time coordinating discharge: 35 minutes  Signed:  Lj Miyamoto  [facility] 08/23/2021, 11:21  AM   Pager on [email or web address]. If 7PM-7AM, please contact night-coverage at [email or web address]

## 2021-08-23 NOTE — Progress Notes (Signed)
I attempted to call report to [facility]. First attempt I was sent to voicemail at the nurse's station. The second attempt I was hung up on. RN CM was notified.

## 2021-08-24 ENCOUNTER — Emergency Department
Admission: EM | Admit: 2021-08-24 | Discharge: 2021-08-25 | Disposition: A | Discharge status: Home / Self Care | Payer: Medicare Other | Attending: Emergency Medicine | Admitting: Emergency Medicine

## 2021-08-24 DIAGNOSIS — Z79899 Other long term (current) drug therapy: Secondary | ICD-10-CM | POA: Insufficient documentation

## 2021-08-24 DIAGNOSIS — Z7901 Long term (current) use of anticoagulants: Secondary | ICD-10-CM | POA: Insufficient documentation

## 2021-08-24 DIAGNOSIS — N3289 Other specified disorders of bladder: Secondary | ICD-10-CM | POA: Insufficient documentation

## 2021-08-24 DIAGNOSIS — I10 Essential (primary) hypertension: Secondary | ICD-10-CM | POA: Insufficient documentation

## 2021-08-24 DIAGNOSIS — Z96652 Presence of left artificial knee joint: Secondary | ICD-10-CM | POA: Insufficient documentation

## 2021-08-24 DIAGNOSIS — R339 Retention of urine, unspecified: Secondary | ICD-10-CM | POA: Diagnosis not present

## 2021-08-24 DIAGNOSIS — I4891 Unspecified atrial fibrillation: Secondary | ICD-10-CM | POA: Insufficient documentation

## 2021-08-24 NOTE — ED Triage Notes (Signed)
Pt presents from [facility] with complaints of "bladder pain" every 20 mins. Per EMS the patient has not urinated all day - nor has he received his pain medications from a recent left tib/fib sx he had here. Pt states he has been urinating but endorses pain.   Pt bladder scanned by this RN - 

## 2021-08-24 NOTE — ED Provider Notes (Signed)
[facility] Emergency Department Provider Note   ____________________________________________   Event Date/Time   First MD Initiated Contact with Patient 08/24/21 2302     (approximate)  I have reviewed the triage vital signs and the nursing notes.   HISTORY  Chief Complaint Urinary Retention    HPI [name] is a 80 y.o. male with past medical history of hypertension, hyperlipidemia, atrial fibrillation on Eliquis, seizures, and DVT who presents to the ED complaining of abdominal pain.  Patient reports that he has been having intermittent spasms of pain in his suprapubic area for about the past week.  He states the episodes have been increasing in frequency and severity, now has severe episodes of pain lasting for couple of minutes at a time.  He denies any associated dysuria, fevers, flank pain, nausea, vomiting, or diarrhea.  He does state that he is able to urinate on his own but only passes a small amount of urine and feels like he does not fully empty his bladder.  He has never had similar symptoms in the past, does not think he has required catheter placement in the past.  He was recently admitted to the hospital for left ankle fracture, states this has been doing well with improved pain.        Past Medical History:  Diagnosis Date   Arthritis    "lower back on down; into all joints; into my feet" (09/17/2012)   Chronic low back pain    hx of   Complication of anesthesia    "cardiac arrest with dye from lexiscan (04/15/10 records indicate bradycardia, no ischemia, EF 56%), unstable BP with anesthesia)   GERD (gastroesophageal reflux disease)    Grand mal 1987; 1989   "take RX daily" (09/17/2012)   Herniated disc, cervical    Hypercholesteremia    Hypertension    sees Dr. 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 01/2019 Echo: EF 55-60%, mild conc LVH. RVSP 59.52mmHg. Mildly dil LA. Mild to mod MR. Mild AI.   PAH (pulmonary artery hypertension) (HCC)    Permanent atrial fibrillation (HCC)    a. CHA2DS2VASc = 5-->No OAC 2/2 h/o SAH; b. 03/2019 Zio: permanent Afib - 78 bpm (46-135). Occas PVCs (1.4% burden).   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 Harold Hedge)   CATARACT EXTRACTION W/ INTRAOCULAR LENS IMPLANT  1980's   "right" (09/17/2012)  COLONOSCOPY     06/26/09   EYE MUSCLE SURGERY  1980's   "right eye" (09/17/2012)   INGUINAL HERNIA REPAIR  04/10/2011   "right" (09/17/2012)   JOINT REPLACEMENT     ORIF ANKLE FRACTURE Left 08/19/2021   Procedure: OPEN REDUCTION INTERNAL FIXATION (ORIF) ANKLE FRACTURE;  Surgeon: Juanell Fairly, MD;  Location: [facility] ORS;  Service: Orthopedics;  Laterality: Left;   PERIPHERAL VASCULAR THROMBECTOMY Left 10/27/2019   Procedure: PERIPHERAL VASCULAR THROMBECTOMY;  Surgeon: Annice Needy, MD;  Location: [facility] INVASIVE CV LAB;  Service: Cardiovascular;  Laterality: Left;   POSTERIOR LUMBAR FUSION  09/16/2012    "L4-5" (09/17/2012)   RETINAL DETACHMENT SURGERY  1980's   "right" (09/17/2012)   TOTAL KNEE ARTHROPLASTY  04/28/2010   "left" (09/17/2012)    Prior to Admission medications   Medication Sig Start Date End Date Taking? Authorizing Provider  acetaminophen (TYLENOL) 325 MG tablet Take 1-2 tablets (325-650 mg total) by mouth every 6 (six) hours as needed for mild pain (pain score 1-3 or temp > 100.5). 08/23/21   Lurene Shadow, MD  aliskiren (TEKTURNA) 300 MG tablet Take 300 mg by mouth daily.     [provider]  amoxicillin (AMOXIL) 500 MG tablet Take 500 mg by mouth as directed. Dental procedure only    [provider]  carbamazepine (TEGRETOL) 200 MG tablet Take 200 mg by mouth 2 (two) times a day.     [provider]  docusate sodium (COLACE) 100 MG capsule Take 1 capsule (100 mg total) by mouth 2 (two) times daily. 08/23/21   Lurene Shadow, MD  doxazosin (CARDURA) 1 MG tablet Take 1 mg by mouth at bedtime.     [provider]  ELIQUIS 5 MG TABS tablet Take 5 mg by mouth 2 (two) times daily. 10/06/19   [provider]  furosemide (LASIX) 20 MG tablet Take 2 tablets (40 mg total) by mouth daily. 05/27/19   Creig Hines, NP  HYDROcodone-acetaminophen (NORCO/VICODIN) 5-325 MG tablet Take 1 tablet by mouth every 6 (six) hours as needed for moderate pain. 08/23/21   Lurene Shadow, MD  irbesartan (AVAPRO) 300 MG tablet Take 300 mg by mouth daily. 11/19/17   [provider]  methocarbamol (ROBAXIN) 500 MG tablet Take 1 tablet (500 mg total) by mouth every 8 (eight) hours as needed for up to 7 days for muscle spasms. 08/23/21 08/30/21  Lurene Shadow, MD  Omega-3 Fatty Acids (FISH OIL) 1200 MG CAPS Take 1,200 mg by mouth daily.    [provider]  pantoprazole (PROTONIX) 40 MG tablet Take 40 mg by mouth at bedtime.     [provider]  senna (SENOKOT) 8.6 MG TABS tablet Take 1 tablet (8.6 mg total) by mouth 2 (two) times  daily. 08/23/21   Lurene Shadow, MD  simvastatin (ZOCOR) 20 MG tablet Take 20 mg by mouth 2 (two) times daily.     [provider]  vitamin B-12 (CYANOCOBALAMIN) 1000 MCG tablet Take 1,000 mcg by mouth daily.    [provider]    Allergies Codeine, Fentanyl, Regadenoson, and Verapamil  Family History  Problem Relation Age of Onset   Heart disease Mother     Social History Social History   Tobacco Use   Smoking status: Never   Smokeless tobacco: Never  Vaping Use   Vaping Use: Never used  Substance Use Topics   Alcohol use: No   Drug use: No    Review of Systems  Constitutional:  No fever/chills Eyes: No visual changes. ENT: No sore throat. Cardiovascular: Denies chest pain. Respiratory: Denies shortness of breath. Gastrointestinal: Positive for abdominal pain.  No nausea, no vomiting.  No diarrhea.  No constipation. Genitourinary: Negative for dysuria.  Positive for difficulty urinating. Musculoskeletal: Negative for back pain. Skin: Negative for rash. Neurological: Negative for headaches, focal weakness or numbness.  ____________________________________________   PHYSICAL EXAM:  VITAL SIGNS: ED Triage Vitals  Enc Vitals Group     BP 08/24/21 2257 (!) 171/81     Pulse Rate 08/24/21 2257 90     Resp 08/24/21 2257 18     Temp 08/24/21 2257 97.9 F (36.6 C)     Temp Source 08/24/21 2257 Oral     SpO2 08/24/21 2257 97 %     Weight 08/24/21 2259 215 lb (97.5 kg)     Height 08/24/21 2259 6' (1.829 m)     Head Circumference --      Peak Flow --      Pain Score 08/24/21 2259 10     Pain Loc --      Pain Edu? --      Excl. in GC? --     Constitutional: Alert and oriented. Eyes: Conjunctivae are normal. Head: Atraumatic. Nose: No congestion/rhinnorhea. Mouth/Throat: Mucous membranes are moist. Neck: Normal ROM Cardiovascular: Normal rate, regular rhythm. Grossly normal heart sounds.  2+ radial pulses bilaterally. Respiratory: Normal  respiratory effort.  No retractions. Lungs CTAB. Gastrointestinal: Soft and nontender. No distention. Genitourinary: deferred Musculoskeletal: Splint in place to distal left lower extremity, cap refill less than 2 seconds in digits of left foot.  No edema or tenderness noted to right lower extremity. Neurologic:  Normal speech and language. No gross focal neurologic deficits are appreciated. Skin:  Skin is warm, dry and intact. No rash noted. Psychiatric: Mood and affect are normal. Speech and behavior are normal.  ____________________________________________   LABS (all labs ordered are listed, but only abnormal results are displayed)  Labs Reviewed  CBC WITH DIFFERENTIAL/PLATELET - Abnormal; Notable for the following components:      Result Value   RBC 4.03 (*)    Hemoglobin 9.3 (*)    HCT 31.0 (*)    MCV 76.9 (*)    MCH 23.1 (*)    RDW 20.0 (*)    All other components within normal limits  BASIC METABOLIC PANEL - Abnormal; Notable for the following components:   Sodium 132 (*)    Glucose, Bld 109 (*)    Calcium 8.6 (*)    All other components within normal limits  URINALYSIS, ROUTINE W REFLEX MICROSCOPIC - Abnormal; Notable for the following components:   Hgb urine dipstick MODERATE (*)    All other components within normal limits  URINALYSIS, MICROSCOPIC (REFLEX)     PROCEDURES  Procedure(s) performed (including Critical Care):  Procedures   ____________________________________________   INITIAL IMPRESSION / ASSESSMENT AND PLAN / ED COURSE      80 year old male with past medical history of hypertension, hyperlipidemia, atrial fibrillation on Eliquis, seizures, and DVT who presents to the ED complaining of intermittent spasms in his lower abdomen along with difficulty urinating for about the past week.  Patient states that he was able to urinate about 20 minutes prior to arrival but reports only passing a small amount.  His bladder scan now shows greater than 500  cc of urine and I suspect symptoms are due to urinary retention and bladder spasms.  He has no abdominal tenderness on  exam and I have low suspicion for other intra-abdominal process.  We will place Foley catheter, check UA, CBC, and BMP.  Patient feeling better following Foley catheter placement episodes of lower abdominal pain.  UA shows no signs of infection, CBC and BMP are unremarkable.  Patient is appropriate for discharge back to his nursing facility, does state that his new facility has had issues filling his prescriptions and he has not had his medications in a couple of days.  He states that medications are due to be delivered later today, we will give evening dose of his usual medications that were missed.  Patient counseled to follow-up with urology and to return to the ED for new worsening symptoms, patient agrees with plan.      ____________________________________________   FINAL CLINICAL IMPRESSION(S) / ED DIAGNOSES  Final diagnoses:  Urinary retention  Bladder spasm     ED Discharge Orders     None        Note:  This document was prepared using Dragon voice recognition software and may include unintentional dictation errors.    Chesley Noon, MD 08/25/21 [phone]

## 2021-08-25 LAB — URINALYSIS, ROUTINE W REFLEX MICROSCOPIC
Bilirubin Urine: NEGATIVE
Glucose, UA: NEGATIVE mg/dL
Ketones, ur: NEGATIVE mg/dL
Leukocytes,Ua: NEGATIVE
Nitrite: NEGATIVE
Protein, ur: NEGATIVE mg/dL
Specific Gravity, Urine: 1.01 (ref 1.005–1.030)
pH: 5.5 (ref 5.0–8.0)

## 2021-08-25 LAB — CBC WITH DIFFERENTIAL/PLATELET
Abs Immature Granulocytes: 0.05 10*3/uL (ref 0.00–0.07)
Basophils Absolute: 0 10*3/uL (ref 0.0–0.1)
Basophils Relative: 0 %
Eosinophils Absolute: 0.2 10*3/uL (ref 0.0–0.5)
Eosinophils Relative: 2 %
HCT: 31 % — ABNORMAL LOW (ref 39.0–52.0)
Hemoglobin: 9.3 g/dL — ABNORMAL LOW (ref 13.0–17.0)
Immature Granulocytes: 1 %
Lymphocytes Relative: 11 %
Lymphs Abs: 1 10*3/uL (ref 0.7–4.0)
MCH: 23.1 pg — ABNORMAL LOW (ref 26.0–34.0)
MCHC: 30 g/dL (ref 30.0–36.0)
MCV: 76.9 fL — ABNORMAL LOW (ref 80.0–100.0)
Monocytes Absolute: 0.8 10*3/uL (ref 0.1–1.0)
Monocytes Relative: 9 %
Neutro Abs: 6.8 10*3/uL (ref 1.7–7.7)
Neutrophils Relative %: 77 %
Platelets: 213 10*3/uL (ref 150–400)
RBC: 4.03 MIL/uL — ABNORMAL LOW (ref 4.22–5.81)
RDW: 20 % — ABNORMAL HIGH (ref 11.5–15.5)
WBC: 8.9 10*3/uL (ref 4.0–10.5)
nRBC: 0 % (ref 0.0–0.2)

## 2021-08-25 LAB — BASIC METABOLIC PANEL
Anion gap: 6 (ref 5–15)
BUN: 12 mg/dL (ref 8–23)
CO2: 25 mmol/L (ref 22–32)
Calcium: 8.6 mg/dL — ABNORMAL LOW (ref 8.9–10.3)
Chloride: 101 mmol/L (ref 98–111)
Creatinine, Ser: 0.84 mg/dL (ref 0.61–1.24)
GFR, Estimated: >60 mL/min (ref >60)
Glucose, Bld: 109 mg/dL — ABNORMAL HIGH (ref 70–99)
Potassium: 3.7 mmol/L (ref 3.5–5.1)
Sodium: 132 mmol/L — ABNORMAL LOW (ref 135–145)

## 2021-08-25 LAB — URINALYSIS, MICROSCOPIC (REFLEX)
Bacteria, UA: NONE SEEN
Squamous Epithelial / HPF: NONE SEEN (ref 0–5)

## 2021-08-25 MED ORDER — CARBAMAZEPINE 200 MG PO TABS
200.0000 mg | ORAL_TABLET | Freq: Once | ORAL | Status: AC
  Administered 2021-08-25: 200 mg via ORAL
  Filled 2021-08-25: qty 1

## 2021-08-25 MED ORDER — APIXABAN 5 MG PO TABS
5.0000 mg | ORAL_TABLET | Freq: Once | ORAL | Status: AC
  Administered 2021-08-25: 5 mg via ORAL
  Filled 2021-08-25: qty 1

## 2021-08-25 MED ORDER — IRBESARTAN 150 MG PO TABS
300.0000 mg | ORAL_TABLET | Freq: Once | ORAL | Status: AC
  Administered 2021-08-25: 300 mg via ORAL
  Filled 2021-08-25: qty 2

## 2021-08-25 MED ORDER — SIMVASTATIN 10 MG PO TABS
20.0000 mg | ORAL_TABLET | Freq: Once | ORAL | Status: AC
  Administered 2021-08-25: 20 mg via ORAL
  Filled 2021-08-25: qty 2

## 2021-08-25 MED ORDER — DOXAZOSIN MESYLATE 1 MG PO TABS
1.0000 mg | ORAL_TABLET | Freq: Once | ORAL | Status: AC
  Administered 2021-08-25: 1 mg via ORAL
  Filled 2021-08-25: qty 1

## 2021-08-25 MED ORDER — PANTOPRAZOLE SODIUM 40 MG PO TBEC
40.0000 mg | DELAYED_RELEASE_TABLET | Freq: Once | ORAL | Status: AC
  Administered 2021-08-25: 40 mg via ORAL
  Filled 2021-08-25: qty 1

## 2021-08-25 MED ORDER — HYDROCODONE-ACETAMINOPHEN 5-325 MG PO TABS
1.0000 | ORAL_TABLET | Freq: Once | ORAL | Status: AC
  Administered 2021-08-25: 1 via ORAL
  Filled 2021-08-25: qty 1

## 2021-08-25 NOTE — ED Notes (Signed)
Pts Wife called by registration - VM left on patients transport back to facility.

## 2021-08-26 DIAGNOSIS — K2100 Gastro-esophageal reflux disease with esophagitis, without bleeding: Secondary | ICD-10-CM | POA: Diagnosis not present

## 2021-08-26 DIAGNOSIS — A419 Sepsis, unspecified organism: Secondary | ICD-10-CM | POA: Diagnosis not present

## 2021-08-26 DIAGNOSIS — N12 Tubulo-interstitial nephritis, not specified as acute or chronic: Secondary | ICD-10-CM | POA: Diagnosis not present

## 2021-08-26 DIAGNOSIS — S8292XA Unspecified fracture of left lower leg, initial encounter for closed fracture: Secondary | ICD-10-CM | POA: Diagnosis not present

## 2021-08-26 DIAGNOSIS — R55 Syncope and collapse: Secondary | ICD-10-CM | POA: Diagnosis not present

## 2021-08-26 DIAGNOSIS — I4821 Permanent atrial fibrillation: Secondary | ICD-10-CM | POA: Diagnosis not present

## 2021-08-26 DIAGNOSIS — I10 Essential (primary) hypertension: Secondary | ICD-10-CM | POA: Diagnosis not present

## 2021-08-29 DIAGNOSIS — R55 Syncope and collapse: Secondary | ICD-10-CM | POA: Diagnosis not present

## 2021-08-29 DIAGNOSIS — S8292XA Unspecified fracture of left lower leg, initial encounter for closed fracture: Secondary | ICD-10-CM | POA: Diagnosis not present

## 2021-08-29 DIAGNOSIS — K2100 Gastro-esophageal reflux disease with esophagitis, without bleeding: Secondary | ICD-10-CM | POA: Diagnosis not present

## 2021-08-29 DIAGNOSIS — I4821 Permanent atrial fibrillation: Secondary | ICD-10-CM | POA: Diagnosis not present

## 2021-08-31 DIAGNOSIS — N139 Obstructive and reflux uropathy, unspecified: Secondary | ICD-10-CM | POA: Diagnosis not present

## 2021-08-31 DIAGNOSIS — S8292XA Unspecified fracture of left lower leg, initial encounter for closed fracture: Secondary | ICD-10-CM | POA: Diagnosis not present

## 2021-08-31 DIAGNOSIS — K2100 Gastro-esophageal reflux disease with esophagitis, without bleeding: Secondary | ICD-10-CM | POA: Diagnosis not present

## 2021-08-31 DIAGNOSIS — R55 Syncope and collapse: Secondary | ICD-10-CM | POA: Diagnosis not present

## 2021-09-01 ENCOUNTER — Ambulatory Visit: Payer: Self-pay | Admitting: Urology

## 2021-09-01 DIAGNOSIS — R55 Syncope and collapse: Secondary | ICD-10-CM | POA: Diagnosis not present

## 2021-09-01 DIAGNOSIS — S8292XA Unspecified fracture of left lower leg, initial encounter for closed fracture: Secondary | ICD-10-CM | POA: Diagnosis not present

## 2021-09-01 DIAGNOSIS — K2100 Gastro-esophageal reflux disease with esophagitis, without bleeding: Secondary | ICD-10-CM | POA: Diagnosis not present

## 2021-09-01 DIAGNOSIS — N139 Obstructive and reflux uropathy, unspecified: Secondary | ICD-10-CM | POA: Diagnosis not present

## 2021-09-02 DIAGNOSIS — S82852A Displaced trimalleolar fracture of left lower leg, initial encounter for closed fracture: Secondary | ICD-10-CM | POA: Diagnosis not present

## 2021-09-05 ENCOUNTER — Emergency Department: Payer: Medicare Other

## 2021-09-05 ENCOUNTER — Other Ambulatory Visit: Payer: Self-pay

## 2021-09-05 ENCOUNTER — Inpatient Hospital Stay: Payer: Medicare Other

## 2021-09-05 ENCOUNTER — Encounter: Payer: Self-pay | Admitting: Internal Medicine

## 2021-09-05 ENCOUNTER — Inpatient Hospital Stay
Admission: EM | Admit: 2021-09-05 | Discharge: 2021-09-13 | DRG: 871 | Disposition: A | Discharge status: Home Health | Payer: Medicare Other | Attending: Internal Medicine | Admitting: Internal Medicine

## 2021-09-05 DIAGNOSIS — A4151 Sepsis due to Escherichia coli [E. coli]: Principal | ICD-10-CM | POA: Diagnosis present

## 2021-09-05 DIAGNOSIS — Z9841 Cataract extraction status, right eye: Secondary | ICD-10-CM

## 2021-09-05 DIAGNOSIS — F0394 Unspecified dementia, unspecified severity, with anxiety: Secondary | ICD-10-CM | POA: Diagnosis not present

## 2021-09-05 DIAGNOSIS — K219 Gastro-esophageal reflux disease without esophagitis: Secondary | ICD-10-CM | POA: Diagnosis present

## 2021-09-05 DIAGNOSIS — D649 Anemia, unspecified: Secondary | ICD-10-CM | POA: Diagnosis present

## 2021-09-05 DIAGNOSIS — N3090 Cystitis, unspecified without hematuria: Secondary | ICD-10-CM | POA: Diagnosis not present

## 2021-09-05 DIAGNOSIS — E7800 Pure hypercholesterolemia, unspecified: Secondary | ICD-10-CM | POA: Diagnosis present

## 2021-09-05 DIAGNOSIS — R531 Weakness: Secondary | ICD-10-CM | POA: Diagnosis not present

## 2021-09-05 DIAGNOSIS — N200 Calculus of kidney: Secondary | ICD-10-CM | POA: Diagnosis not present

## 2021-09-05 DIAGNOSIS — R300 Dysuria: Secondary | ICD-10-CM | POA: Diagnosis present

## 2021-09-05 DIAGNOSIS — N10 Acute tubulo-interstitial nephritis: Secondary | ICD-10-CM | POA: Diagnosis present

## 2021-09-05 DIAGNOSIS — R079 Chest pain, unspecified: Secondary | ICD-10-CM

## 2021-09-05 DIAGNOSIS — I10 Essential (primary) hypertension: Secondary | ICD-10-CM | POA: Diagnosis present

## 2021-09-05 DIAGNOSIS — R7309 Other abnormal glucose: Secondary | ICD-10-CM | POA: Diagnosis present

## 2021-09-05 DIAGNOSIS — R6520 Severe sepsis without septic shock: Secondary | ICD-10-CM | POA: Diagnosis not present

## 2021-09-05 DIAGNOSIS — N12 Tubulo-interstitial nephritis, not specified as acute or chronic: Secondary | ICD-10-CM

## 2021-09-05 DIAGNOSIS — Z8249 Family history of ischemic heart disease and other diseases of the circulatory system: Secondary | ICD-10-CM

## 2021-09-05 DIAGNOSIS — N179 Acute kidney failure, unspecified: Secondary | ICD-10-CM | POA: Diagnosis not present

## 2021-09-05 DIAGNOSIS — Z20822 Contact with and (suspected) exposure to covid-19: Secondary | ICD-10-CM | POA: Diagnosis not present

## 2021-09-05 DIAGNOSIS — Z981 Arthrodesis status: Secondary | ICD-10-CM | POA: Diagnosis not present

## 2021-09-05 DIAGNOSIS — F05 Delirium due to known physiological condition: Secondary | ICD-10-CM | POA: Diagnosis not present

## 2021-09-05 DIAGNOSIS — Z8673 Personal history of transient ischemic attack (TIA), and cerebral infarction without residual deficits: Secondary | ICD-10-CM

## 2021-09-05 DIAGNOSIS — Z888 Allergy status to other drugs, medicaments and biological substances status: Secondary | ICD-10-CM | POA: Diagnosis not present

## 2021-09-05 DIAGNOSIS — Z96652 Presence of left artificial knee joint: Secondary | ICD-10-CM | POA: Diagnosis present

## 2021-09-05 DIAGNOSIS — I4891 Unspecified atrial fibrillation: Secondary | ICD-10-CM | POA: Diagnosis present

## 2021-09-05 DIAGNOSIS — I4821 Permanent atrial fibrillation: Secondary | ICD-10-CM | POA: Diagnosis not present

## 2021-09-05 DIAGNOSIS — Z743 Need for continuous supervision: Secondary | ICD-10-CM | POA: Diagnosis not present

## 2021-09-05 DIAGNOSIS — R197 Diarrhea, unspecified: Secondary | ICD-10-CM | POA: Diagnosis not present

## 2021-09-05 DIAGNOSIS — I639 Cerebral infarction, unspecified: Secondary | ICD-10-CM | POA: Diagnosis present

## 2021-09-05 DIAGNOSIS — I214 Non-ST elevation (NSTEMI) myocardial infarction: Secondary | ICD-10-CM | POA: Diagnosis not present

## 2021-09-05 DIAGNOSIS — Z961 Presence of intraocular lens: Secondary | ICD-10-CM | POA: Diagnosis present

## 2021-09-05 DIAGNOSIS — J9601 Acute respiratory failure with hypoxia: Secondary | ICD-10-CM

## 2021-09-05 DIAGNOSIS — R112 Nausea with vomiting, unspecified: Secondary | ICD-10-CM | POA: Diagnosis not present

## 2021-09-05 DIAGNOSIS — A419 Sepsis, unspecified organism: Secondary | ICD-10-CM | POA: Diagnosis present

## 2021-09-05 DIAGNOSIS — N139 Obstructive and reflux uropathy, unspecified: Secondary | ICD-10-CM | POA: Diagnosis not present

## 2021-09-05 DIAGNOSIS — Z7901 Long term (current) use of anticoagulants: Secondary | ICD-10-CM | POA: Diagnosis not present

## 2021-09-05 DIAGNOSIS — R339 Retention of urine, unspecified: Secondary | ICD-10-CM | POA: Diagnosis present

## 2021-09-05 DIAGNOSIS — R509 Fever, unspecified: Secondary | ICD-10-CM | POA: Diagnosis not present

## 2021-09-05 DIAGNOSIS — M25552 Pain in left hip: Secondary | ICD-10-CM | POA: Diagnosis not present

## 2021-09-05 DIAGNOSIS — N390 Urinary tract infection, site not specified: Secondary | ICD-10-CM | POA: Diagnosis present

## 2021-09-05 DIAGNOSIS — I82409 Acute embolism and thrombosis of unspecified deep veins of unspecified lower extremity: Secondary | ICD-10-CM | POA: Diagnosis present

## 2021-09-05 DIAGNOSIS — R0902 Hypoxemia: Secondary | ICD-10-CM | POA: Diagnosis not present

## 2021-09-05 DIAGNOSIS — R5381 Other malaise: Secondary | ICD-10-CM | POA: Diagnosis not present

## 2021-09-05 DIAGNOSIS — R52 Pain, unspecified: Secondary | ICD-10-CM

## 2021-09-05 DIAGNOSIS — R110 Nausea: Secondary | ICD-10-CM | POA: Diagnosis not present

## 2021-09-05 DIAGNOSIS — R4182 Altered mental status, unspecified: Secondary | ICD-10-CM | POA: Diagnosis not present

## 2021-09-05 DIAGNOSIS — R569 Unspecified convulsions: Secondary | ICD-10-CM

## 2021-09-05 DIAGNOSIS — Z885 Allergy status to narcotic agent status: Secondary | ICD-10-CM | POA: Diagnosis not present

## 2021-09-05 DIAGNOSIS — R778 Other specified abnormalities of plasma proteins: Secondary | ICD-10-CM | POA: Diagnosis not present

## 2021-09-05 DIAGNOSIS — G40909 Epilepsy, unspecified, not intractable, without status epilepticus: Secondary | ICD-10-CM | POA: Diagnosis present

## 2021-09-05 DIAGNOSIS — Z8679 Personal history of other diseases of the circulatory system: Secondary | ICD-10-CM

## 2021-09-05 DIAGNOSIS — J9690 Respiratory failure, unspecified, unspecified whether with hypoxia or hypercapnia: Secondary | ICD-10-CM | POA: Diagnosis not present

## 2021-09-05 DIAGNOSIS — Z79899 Other long term (current) drug therapy: Secondary | ICD-10-CM

## 2021-09-05 DIAGNOSIS — R195 Other fecal abnormalities: Secondary | ICD-10-CM | POA: Diagnosis present

## 2021-09-05 DIAGNOSIS — I517 Cardiomegaly: Secondary | ICD-10-CM | POA: Diagnosis not present

## 2021-09-05 HISTORY — DX: Acute kidney failure, unspecified: N17.9

## 2021-09-05 LAB — FOLATE: Folate: 8.8 ng/mL (ref >5.9)

## 2021-09-05 LAB — CBC WITH DIFFERENTIAL/PLATELET
Abs Immature Granulocytes: 0.48 10*3/uL — ABNORMAL HIGH (ref 0.00–0.07)
Basophils Absolute: 0.1 10*3/uL (ref 0.0–0.1)
Basophils Relative: 0 %
Eosinophils Absolute: 0.1 10*3/uL (ref 0.0–0.5)
Eosinophils Relative: 0 %
HCT: 35.7 % — ABNORMAL LOW (ref 39.0–52.0)
Hemoglobin: 10.6 g/dL — ABNORMAL LOW (ref 13.0–17.0)
Immature Granulocytes: 2 %
Lymphocytes Relative: 3 %
Lymphs Abs: 0.7 10*3/uL (ref 0.7–4.0)
MCH: 22.9 pg — ABNORMAL LOW (ref 26.0–34.0)
MCHC: 29.7 g/dL — ABNORMAL LOW (ref 30.0–36.0)
MCV: 77.3 fL — ABNORMAL LOW (ref 80.0–100.0)
Monocytes Absolute: 1.1 10*3/uL — ABNORMAL HIGH (ref 0.1–1.0)
Monocytes Relative: 4 %
Neutro Abs: 23.6 10*3/uL — ABNORMAL HIGH (ref 1.7–7.7)
Neutrophils Relative %: 91 %
Platelets: 268 10*3/uL (ref 150–400)
RBC: 4.62 MIL/uL (ref 4.22–5.81)
RDW: 22.7 % — ABNORMAL HIGH (ref 11.5–15.5)
WBC: 26 10*3/uL — ABNORMAL HIGH (ref 4.0–10.5)
nRBC: 0 % (ref 0.0–0.2)

## 2021-09-05 LAB — T4, FREE: Free T4: 0.87 ng/dL (ref 0.61–1.12)

## 2021-09-05 LAB — COMPREHENSIVE METABOLIC PANEL
ALT: 19 U/L (ref 0–44)
AST: 30 U/L (ref 15–41)
Albumin: 3.4 g/dL — ABNORMAL LOW (ref 3.5–5.0)
Alkaline Phosphatase: 140 U/L — ABNORMAL HIGH (ref 38–126)
Anion gap: 11 (ref 5–15)
BUN: 23 mg/dL (ref 8–23)
CO2: 27 mmol/L (ref 22–32)
Calcium: 9.3 mg/dL (ref 8.9–10.3)
Chloride: 96 mmol/L — ABNORMAL LOW (ref 98–111)
Creatinine, Ser: 1.31 mg/dL — ABNORMAL HIGH (ref 0.61–1.24)
GFR, Estimated: 55 mL/min — ABNORMAL LOW (ref >60)
Glucose, Bld: 137 mg/dL — ABNORMAL HIGH (ref 70–99)
Potassium: 3.7 mmol/L (ref 3.5–5.1)
Sodium: 134 mmol/L — ABNORMAL LOW (ref 135–145)
Total Bilirubin: 1.1 mg/dL (ref 0.3–1.2)
Total Protein: 7.3 g/dL (ref 6.5–8.1)

## 2021-09-05 LAB — TSH: TSH: 1.536 u[IU]/mL (ref 0.350–4.500)

## 2021-09-05 LAB — TROPONIN I (HIGH SENSITIVITY): Troponin I (High Sensitivity): 61 ng/L — ABNORMAL HIGH (ref <18)

## 2021-09-05 LAB — FERRITIN: Ferritin: 72 ng/mL (ref 24–336)

## 2021-09-05 LAB — IRON AND TIBC
Iron: 17 ug/dL — ABNORMAL LOW (ref 45–182)
Saturation Ratios: 7 % — ABNORMAL LOW (ref 17.9–39.5)
TIBC: 263 ug/dL (ref 250–450)
UIBC: 246 ug/dL

## 2021-09-05 LAB — PROCALCITONIN: Procalcitonin: 26.01 ng/mL

## 2021-09-05 LAB — LACTIC ACID, PLASMA
Lactic Acid, Venous: 3.1 mmol/L (ref 0.5–1.9)
Lactic Acid, Venous: 4.1 mmol/L (ref 0.5–1.9)
Lactic Acid, Venous: 6.4 mmol/L (ref 0.5–1.9)

## 2021-09-05 LAB — RESP PANEL BY RT-PCR (FLU A&B, COVID) ARPGX2
Influenza A by PCR: NEGATIVE
Influenza B by PCR: NEGATIVE
SARS Coronavirus 2 by RT PCR: NEGATIVE

## 2021-09-05 LAB — RETICULOCYTES
Immature Retic Fract: 32.4 % — ABNORMAL HIGH (ref 2.3–15.9)
RBC.: 3.87 MIL/uL — ABNORMAL LOW (ref 4.22–5.81)
Retic Count, Absolute: 41.4 10*3/uL (ref 19.0–186.0)
Retic Ct Pct: 1.1 % (ref 0.4–3.1)

## 2021-09-05 LAB — BRAIN NATRIURETIC PEPTIDE: B Natriuretic Peptide: 426.2 pg/mL — ABNORMAL HIGH (ref 0.0–100.0)

## 2021-09-05 LAB — PROTIME-INR
INR: 1.7 — ABNORMAL HIGH (ref 0.8–1.2)
Prothrombin Time: 20.4 seconds — ABNORMAL HIGH (ref 11.4–15.2)

## 2021-09-05 LAB — LIPASE, BLOOD: Lipase: 25 U/L (ref 11–51)

## 2021-09-05 LAB — CARBAMAZEPINE LEVEL, TOTAL: Carbamazepine Lvl: 7.7 ug/mL (ref 4.0–12.0)

## 2021-09-05 MED ORDER — METRONIDAZOLE 500 MG/100ML IV SOLN
500.0000 mg | Freq: Once | INTRAVENOUS | Status: AC
  Administered 2021-09-05: 500 mg via INTRAVENOUS
  Filled 2021-09-05: qty 100

## 2021-09-05 MED ORDER — IOHEXOL 300 MG/ML  SOLN
100.0000 mL | Freq: Once | INTRAMUSCULAR | Status: AC | PRN
  Administered 2021-09-05: 100 mL via INTRAVENOUS

## 2021-09-05 MED ORDER — METRONIDAZOLE 500 MG/100ML IV SOLN: 500.0000 mg | Freq: Once | INTRAVENOUS | Status: DC

## 2021-09-05 MED ORDER — PANTOPRAZOLE SODIUM 40 MG IV SOLR
40.0000 mg | Freq: Two times a day (BID) | INTRAVENOUS | Status: DC
  Administered 2021-09-05 – 2021-09-09 (×8): 40 mg via INTRAVENOUS
  Filled 2021-09-05 (×8): qty 40

## 2021-09-05 MED ORDER — SODIUM CHLORIDE 0.9 % IV SOLN: INTRAVENOUS | Status: DC

## 2021-09-05 MED ORDER — SODIUM CHLORIDE 0.9 % IV BOLUS
1000.0000 mL | Freq: Once | INTRAVENOUS | Status: AC
  Administered 2021-09-05: 1000 mL via INTRAVENOUS

## 2021-09-05 MED ORDER — SODIUM CHLORIDE 0.9 % IV SOLN
2.0000 g | Freq: Once | INTRAVENOUS | Status: AC
  Administered 2021-09-05: 2 g via INTRAVENOUS
  Filled 2021-09-05: qty 2

## 2021-09-05 MED ORDER — SODIUM CHLORIDE 0.9 % IV SOLN
2.0000 g | Freq: Two times a day (BID) | INTRAVENOUS | Status: DC
Start: 2021-09-06 — End: 2021-09-06
  Administered 2021-09-06: 2 g via INTRAVENOUS
  Filled 2021-09-05: qty 2

## 2021-09-05 MED ORDER — CARBAMAZEPINE 200 MG PO TABS
200.0000 mg | ORAL_TABLET | Freq: Two times a day (BID) | ORAL | Status: DC
  Administered 2021-09-05 – 2021-09-13 (×17): 200 mg via ORAL
  Filled 2021-09-05 (×18): qty 1

## 2021-09-05 MED ORDER — HALOPERIDOL LACTATE 5 MG/ML IJ SOLN
2.0000 mg | Freq: Four times a day (QID) | INTRAMUSCULAR | Status: DC | PRN
  Filled 2021-09-05: qty 1

## 2021-09-05 MED ORDER — METOPROLOL TARTRATE 5 MG/5ML IV SOLN: 5.0000 mg | Freq: Once | INTRAVENOUS | Status: DC

## 2021-09-05 MED ORDER — LACTATED RINGERS IV BOLUS
1000.0000 mL | Freq: Once | INTRAVENOUS | Status: AC
  Administered 2021-09-05: 1000 mL via INTRAVENOUS

## 2021-09-05 MED ORDER — VANCOMYCIN HCL IN DEXTROSE 1-5 GM/200ML-% IV SOLN
1000.0000 mg | Freq: Once | INTRAVENOUS | Status: AC
  Administered 2021-09-05: 1000 mg via INTRAVENOUS
  Filled 2021-09-05: qty 200

## 2021-09-05 MED ORDER — ACETAMINOPHEN 325 MG PO TABS
650.0000 mg | ORAL_TABLET | Freq: Once | ORAL | Status: AC
  Administered 2021-09-05: 325 mg via ORAL
  Filled 2021-09-05: qty 2

## 2021-09-05 MED ORDER — APIXABAN 5 MG PO TABS
5.0000 mg | ORAL_TABLET | Freq: Two times a day (BID) | ORAL | Status: DC
  Administered 2021-09-05: 5 mg via ORAL
  Filled 2021-09-05: qty 1

## 2021-09-05 MED ORDER — VANCOMYCIN HCL 750 MG/150ML IV SOLN
750.0000 mg | Freq: Two times a day (BID) | INTRAVENOUS | Status: DC
  Administered 2021-09-06 (×2): 750 mg via INTRAVENOUS
  Filled 2021-09-05 (×4): qty 150

## 2021-09-05 MED ORDER — DOXAZOSIN MESYLATE 1 MG PO TABS
1.0000 mg | ORAL_TABLET | Freq: Every day | ORAL | Status: DC
  Administered 2021-09-05: 1 mg via ORAL
  Filled 2021-09-05: qty 1

## 2021-09-05 NOTE — ED Provider Notes (Signed)
[facility] Emergency Department Provider Note   ____________________________________________   Event Date/Time   First MD Initiated Contact with Patient 09/05/21 1508     (approximate)  I have reviewed the triage vital signs and the nursing notes.   HISTORY  Chief Complaint Fever    HPI [name] is a 80 y.o. male with past medical history of hypertension, hyperlipidemia, seizures, atrial fibrillation on Eliquis, and DVT who presents to the ED for fever.  Patient recently had Foley catheter placed about 2 weeks ago here in the ED due to urinary retention and bladder spasms.  Per EMS, Foley catheter was subsequently removed by provider at [facility] 3 days ago.  They state that since then, patient has been dealing with nausea, vomiting, diarrhea, and dysuria.  EMS was called today and found patient to be febrile to 103 on their arrival.  There was also concern that patient had low O2 sats of 88% on room air, subsequently placed on 4 L nasal cannula by EMS.  Patient endorses, vomiting and diarrhea, denies any abdominal pain and was not aware of any fever.  He does report a cough that is nonproductive, denies any chest pain or shortness of breath.  He is not aware of any sick contacts.        Past Medical History:  Diagnosis Date   Arthritis    "lower back on down; into all joints; into my feet" (09/17/2012)   Chronic low back pain    hx of   Complication of anesthesia    "cardiac arrest with dye from lexiscan (04/15/10 records indicate bradycardia, no ischemia, EF 56%), unstable BP with anesthesia)   GERD (gastroesophageal reflux disease)    Grand mal 1987; 1989   "take RX daily" (09/17/2012)   Herniated disc, cervical    Hypercholesteremia    Hypertension    sees Dr. 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 01/2019 Echo: EF 55-60%, mild conc LVH. RVSP 59.50mmHg. Mildly dil LA. Mild to mod MR. Mild AI.   PAH (pulmonary artery hypertension) (HCC)    Permanent atrial fibrillation (HCC)    a. CHA2DS2VASc = 5-->No OAC 2/2 h/o SAH; b. 03/2019 Zio: permanent Afib - 78 bpm (46-135). Occas PVCs (1.4% burden).   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 Harold Hedge)   CATARACT EXTRACTION W/ INTRAOCULAR LENS IMPLANT  1980's   "right" (09/17/2012)  COLONOSCOPY     06/26/09   EYE MUSCLE SURGERY  1980's   "right eye" (09/17/2012)   INGUINAL HERNIA REPAIR  04/10/2011   "right" (09/17/2012)   JOINT REPLACEMENT     ORIF ANKLE FRACTURE Left 08/19/2021   Procedure: OPEN REDUCTION INTERNAL FIXATION (ORIF) ANKLE FRACTURE;  Surgeon: Juanell Fairly, MD;  Location: [facility] ORS;  Service: Orthopedics;  Laterality: Left;   PERIPHERAL VASCULAR THROMBECTOMY Left 10/27/2019   Procedure: PERIPHERAL VASCULAR THROMBECTOMY;  Surgeon: Annice Needy, MD;  Location: [facility] INVASIVE CV LAB;  Service: Cardiovascular;  Laterality: Left;   POSTERIOR LUMBAR FUSION  09/16/2012   "L4-5"  (09/17/2012)   RETINAL DETACHMENT SURGERY  1980's   "right" (09/17/2012)   TOTAL KNEE ARTHROPLASTY  04/28/2010   "left" (09/17/2012)    Prior to Admission medications   Medication Sig Start Date End Date Taking? Authorizing Provider  acetaminophen (TYLENOL) 325 MG tablet Take 1-2 tablets (325-650 mg total) by mouth every 6 (six) hours as needed for mild pain (pain score 1-3 or temp > 100.5). 08/23/21   Lurene Shadow, MD  aliskiren (TEKTURNA) 300 MG tablet Take 300 mg by mouth daily.     [provider]  amoxicillin (AMOXIL) 500 MG tablet Take 500 mg by mouth as directed. Dental procedure only    [provider]  carbamazepine (TEGRETOL) 200 MG tablet Take 200 mg by mouth 2 (two) times a day.     [provider]  docusate sodium (COLACE) 100 MG capsule Take 1 capsule (100 mg total) by mouth 2 (two) times daily. 08/23/21   Lurene Shadow, MD  doxazosin (CARDURA) 1 MG tablet Take 1 mg by mouth at bedtime.     [provider]  ELIQUIS 5 MG TABS tablet Take 5 mg by mouth 2 (two) times daily. 10/06/19   [provider]  furosemide (LASIX) 20 MG tablet Take 2 tablets (40 mg total) by mouth daily. 05/27/19   Creig Hines, NP  HYDROcodone-acetaminophen (NORCO/VICODIN) 5-325 MG tablet Take 1 tablet by mouth every 6 (six) hours as needed for moderate pain. 08/23/21   Lurene Shadow, MD  irbesartan (AVAPRO) 300 MG tablet Take 300 mg by mouth daily. 11/19/17   [provider]  Omega-3 Fatty Acids (FISH OIL) 1200 MG CAPS Take 1,200 mg by mouth daily.    [provider]  pantoprazole (PROTONIX) 40 MG tablet Take 40 mg by mouth at bedtime.     [provider]  senna (SENOKOT) 8.6 MG TABS tablet Take 1 tablet (8.6 mg total) by mouth 2 (two) times daily. 08/23/21   Lurene Shadow, MD  simvastatin (ZOCOR) 20 MG tablet Take 20 mg by mouth 2 (two) times daily.     [provider]  vitamin B-12 (CYANOCOBALAMIN) 1000 MCG tablet  Take 1,000 mcg by mouth daily.    [provider]    Allergies Codeine, Fentanyl, Regadenoson, and Verapamil  Family History  Problem Relation Age of Onset   Heart disease Mother     Social History Social History   Tobacco Use   Smoking status: Never   Smokeless tobacco: Never  Vaping Use   Vaping Use: Never used  Substance Use Topics   Alcohol use: No   Drug use: No    Review of Systems  Constitutional: Positive for fever/chills Eyes: No visual changes. ENT: No sore throat. Cardiovascular: Denies chest pain. Respiratory: Denies shortness of breath.  Positive for cough. Gastrointestinal: No abdominal pain.  Positive for nausea, vomiting, and  diarrhea.  No constipation. Genitourinary: Positive for dysuria. Musculoskeletal: Negative for back pain. Skin: Negative for rash. Neurological: Negative for headaches, focal weakness or numbness.  ____________________________________________   PHYSICAL EXAM:  VITAL SIGNS: ED Triage Vitals  Enc Vitals Group     BP 09/05/21 1514 140/72     Pulse Rate 09/05/21 1514 91     Resp 09/05/21 1514 (!) 33     Temp 09/05/21 1514 (!) 103 F (39.4 C)     Temp Source 09/05/21 1514 Oral     SpO2 09/05/21 1514 98 %     Weight 09/05/21 1518 215 lb (97.5 kg)     Height 09/05/21 1518 6' (1.829 m)     Head Circumference --      Peak Flow --      Pain Score 09/05/21 1514 0     Pain Loc --      Pain Edu? --      Excl. in GC? --     Constitutional: Alert and oriented. Eyes: Conjunctivae are normal. Head: Atraumatic. Nose: No congestion/rhinnorhea. Mouth/Throat: Mucous membranes are moist. Neck: Normal ROM Cardiovascular: Normal rate, irregularly irregular rhythm. Grossly normal heart sounds.  2+ radial pulses bilaterally. Respiratory: Tachypneic with normal respiratory effort.  No retractions. Lungs CTAB. Gastrointestinal: Soft and mildly tender to palpation in the suprapubic area with no rebound or guarding. No  distention. Genitourinary: deferred Musculoskeletal: No lower extremity tenderness nor edema.  Cast in place to distal left lower extremity, range of motion in toes intact and cap refill less than 2 seconds in left toes. Neurologic:  Normal speech and language. No gross focal neurologic deficits are appreciated. Skin:  Skin is warm, dry and intact. No rash noted. Psychiatric: Mood and affect are normal. Speech and behavior are normal.  ____________________________________________   LABS (all labs ordered are listed, but only abnormal results are displayed)  Labs Reviewed  COMPREHENSIVE METABOLIC PANEL - Abnormal; Notable for the following components:      Result Value   Sodium 134 (*)    Chloride 96 (*)    Glucose, Bld 137 (*)    Creatinine, Ser 1.31 (*)    Albumin 3.4 (*)    Alkaline Phosphatase 140 (*)    GFR, Estimated 55 (*)    All other components within normal limits  LACTIC ACID, PLASMA - Abnormal; Notable for the following components:   Lactic Acid, Venous 4.1 (*)    All other components within normal limits  LACTIC ACID, PLASMA - Abnormal; Notable for the following components:   Lactic Acid, Venous 3.1 (*)    All other components within normal limits  CBC WITH DIFFERENTIAL/PLATELET - Abnormal; Notable for the following components:   WBC 26.0 (*)    Hemoglobin 10.6 (*)    HCT 35.7 (*)    MCV 77.3 (*)    MCH 22.9 (*)    MCHC 29.7 (*)    RDW 22.7 (*)    Neutro Abs 23.6 (*)    Monocytes Absolute 1.1 (*)    Abs Immature Granulocytes 0.48 (*)    All other components within normal limits  PROTIME-INR - Abnormal; Notable for the following components:   Prothrombin Time 20.4 (*)    INR 1.7 (*)    All other components within normal limits  RESP PANEL BY RT-PCR (FLU A&B, COVID) ARPGX2  CULTURE, BLOOD (ROUTINE X 2)  CULTURE, BLOOD (ROUTINE X 2)  URINE CULTURE  PROCALCITONIN  LIPASE, BLOOD  URINALYSIS, ROUTINE W REFLEX MICROSCOPIC  ____________________________________________  EKG  ED ECG REPORT I, Chesley Noon, the attending physician, personally viewed and interpreted this ECG.   Date: 09/05/2021  EKG Time: 15:12  Rate: 92  Rhythm: atrial fibrillation  Axis: LAD  Intervals: Incomplete LBBB  ST&T Change: None   PROCEDURES  Procedure(s) performed (including Critical Care):  .Critical Care Performed by: Chesley Noon, MD Authorized by: Chesley Noon, MD   Critical care provider statement:    Critical care time (minutes):  45   Critical care time was exclusive of:  Separately billable procedures and treating other patients and teaching time   Critical care was necessary to treat or prevent imminent or life-threatening deterioration of the following conditions:  Sepsis   Critical care was time spent personally by me on the following activities:  Development of treatment plan with patient or surrogate, discussions with consultants, evaluation of patient's response to treatment, examination of patient, ordering and review of laboratory studies, ordering and review of radiographic studies, ordering and performing treatments and interventions, pulse oximetry, re-evaluation of patient's condition and review of old charts   I assumed direction of critical care for this patient from another provider in my specialty: no     Care discussed with: admitting provider     ____________________________________________   INITIAL IMPRESSION / ASSESSMENT AND PLAN / ED COURSE      80 year old male with past medical history of hypertension, hyperlipidemia, seizures, atrial fibrillation on Eliquis, and DVT who presents to the ED with 3 days of nausea, vomiting, diarrhea, and dysuria after Foley catheter was removed recently.  Patient noted to be febrile and tachypneic upon arrival to the ED and presentation is concerning for sepsis.  We will draw blood cultures, hydrate with IV fluids, and initiate broad-spectrum  antibiotics.  UTI seems most likely source, but we will also check chest x-ray and viral testing.  We will perform bladder scan to assess for urinary retention given his recent Foley catheter requirement and suprapubic tenderness.  Bladder scan was unable to identify patient's bladder however bedside ultrasound shows decompressed bladder with no evidence of acute urinary retention.  Straight catheterization was unsuccessful in obtaining urine sample.  We will continue IV fluid hydration for patient's sepsis and give him a chance to urinate on his own before placing Foley catheter or reattempting straight cath.  Chest x-ray reviewed by me and shows no infiltrate, edema, or effusion.  Labs remarkable for leukocytosis and elevated lactic acidosis, consistent with patient's sepsis.  CT scan shows inflammatory changes around the bladder as well as tracking up towards right kidney.  Source of patient's sepsis appears to be pyelonephritis, case discussed with hospitalist for admission.      ____________________________________________   FINAL CLINICAL IMPRESSION(S) / ED DIAGNOSES  Final diagnoses:  Sepsis without acute organ dysfunction, due to unspecified organism [facility])  Pyelonephritis     ED Discharge Orders     None        Note:  This document was prepared using Dragon voice recognition software and may include unintentional dictation errors.    Chesley Noon, MD 09/05/21 [phone]

## 2021-09-05 NOTE — ED Notes (Signed)
Unable to get an iv after multiple attempts.  Md aware.  Iv team consult ordered.

## 2021-09-05 NOTE — ED Notes (Signed)
Unable to insert in and out cath due to pain and blood in catheter tube.  Md aware  family with pt.

## 2021-09-05 NOTE — Consult Note (Signed)
Pharmacy Antibiotic Note  [name] is a 80 y.o. male admitted on 09/05/2021 with sepsis.  Pharmacy has been consulted for cefepime and Vancomycin dosing.  Plan: Cefepime 2 gram Q12H Vancomycin 2000 mg LD x 1  Initiate Vancomycin 750 mg Q12H. Goal AUC 400-550 Estimated AUC 478/ Scr 1.31, IBW, Vd 0.72    Height: 6' (182.9 cm) Weight: 97.5 kg (215 lb) IBW/kg (Calculated) : 77.6  Temp (24hrs), Avg:103 F (39.4 C), Min:103 F (39.4 C), Max:103 F (39.4 C)  Recent Labs  Lab 09/05/21 1520 09/05/21 1521 09/05/21 1528  WBC  --  26.0*  --   CREATININE  --  1.31*  --   LATICACIDVEN 4.1*  --  3.1*    Estimated Creatinine Clearance: 53.5 mL/min (A) (by C-G formula based on SCr of 1.31 mg/dL (H)).    Allergies  Allergen Reactions   Codeine Nausea Only and Other (See Comments)   Fentanyl Other (See Comments)    Given before knee surgery, blood pressure became extremely low   Regadenoson Other (See Comments)    Created cardiac arrest   Verapamil Other (See Comments)    Per patient: Medication has caused low heart rate and caused patient to have afib that he currently has    Antimicrobials this admission: 12/12 cefepime >>  12/12 Vancomycin >>  12/12 metronidazole x 1   Dose adjustments this admission:   Microbiology results: 12/12 BCx: sent 12/12 UCx: sent    Thank you for allowing pharmacy to be a part of this patient's care.  Sharen Hones, PharmD, BCPS Clinical Pharmacist   09/05/2021 7:14 PM

## 2021-09-05 NOTE — ED Notes (Signed)
Report called to Yaakov Guthrie, RN. Patient transported to [facility] via Doctor, general practice by [employer], RN

## 2021-09-05 NOTE — ED Notes (Signed)
Unable to obtain IV access - Iv team consult placed

## 2021-09-05 NOTE — ED Notes (Signed)
Report received from Amy, RN.

## 2021-09-05 NOTE — Progress Notes (Signed)
CODE SEPSIS - PHARMACY COMMUNICATION  **Broad Spectrum Antibiotics should be administered within 1 hour of Sepsis diagnosis**  Time Code Sepsis Called/Page Received: 1535  Antibiotics Ordered: Cefepime + vancomycin + metronidazole  Time of 1st antibiotic administration: 1609  Additional action taken by pharmacy: N/A  Tressie Ellis 09/05/2021  3:51 PM

## 2021-09-05 NOTE — ED Triage Notes (Signed)
Pt to ED via [facility] from [facility]. Pt had is urinary catheter removed Friday and states two hours later he started having symptoms of N/V/D and urinary pain. Pt was febrile in route with temp 103. Pt offered tylenol but declined due to previous reaction. EMS stating pt sats also decreased to 88% and was placed on 4L [place] with improvement to 98%. Pt 97% on room air and temp 103 on arrival.

## 2021-09-05 NOTE — ED Notes (Signed)
Family with pt   pt alert  meds infusing.

## 2021-09-05 NOTE — Consult Note (Signed)
PHARMACY -  BRIEF ANTIBIOTIC NOTE   Pharmacy has received consult(s) for cefepime and vancomycin from an ED provider. Patient is also ordered metronidazole. The patient's profile has been reviewed for ht/wt/allergies/indication/available labs.    One time order(s) placed for  --Cefepime 2 g IV x 1 --Vancomycin 1 g IV x 1  Further antibiotics/pharmacy consults should be ordered by admitting physician if indicated.                       Thank you, Tressie Ellis 09/05/2021  3:49 PM

## 2021-09-05 NOTE — ED Notes (Signed)
Pt would only take 1 tylenol  md aware.

## 2021-09-05 NOTE — H&P (Addendum)
History and Physical    TRAN ARZUAGA UMP:[id number] DOB: 01-05-1941 DOA: 09/05/2021  PCP: [name], MD    Patient coming from:  Home    Chief Complaint:  Dysuria   HPI:  [name] is a 80 y.o. male seen in ed with complaints of  Dysuria fever and chills since Friday evening.Pt is not able to start a stream.  Pt also reports diarrhea. Incomplete emptying.  Patient has dementia and is able to give [place] history but briefly.  And was seen about a week ago for acute urinary retention and had a Foley placed and was discharged with urology follow-up which he has yet to do as the appointment is upcoming.  Currently patient denies any headaches blurred vision chest pain shortness of breath nausea vomiting.  Patient states that he has had an intracranial hemorrhage and had to have neurosurgery at [facility].  He is on Eliquis currently and has been cleared through his cardiologist.  Pt has past medical history of acute kidney injury, anemia, atrial fibrillation, DVT, history of cerebral hemorrhage, hypertension, occult GI bleed, anemia, seizures-no seizures since 1989 per wife, history of stroke, history of urinary retention. ED Course:  Vitals:   09/05/21 1700 09/05/21 1715 09/05/21 1900 09/05/21 1947  BP: (!) 123/59  (!) 101/53 128/63  Pulse: 100 91 87 89  Resp: 16 (!) 22 15   Temp:    98.6 F (37 C)  TempSrc:    Oral  SpO2: 92% 93% 95% 96%  Weight:      Height:      In the emergency room patient is alert awake oriented, wife at bedside, vitals show he has blood pressure is soft, he is in A. Fib. CMP shows sodium of 134 chloride 96 glucose 137 creatinine 1.31, glucose alk phos of 140, normal AST ALT, EGFR of 55 with a creatinine of 1.31, BNP of 426.2, troponin of 61.,  Lactic acid of 4.1, repeat of 3.1, CBC shows a white count of 26,000, hemoglobin of 10.6, MCV of 77.3 RDW of 22.7 platelet count of 268.  Respiratory panel negative for flu and COVID.  No urinalysis as unable to  get urines sample.  Will attempt again after IV fluids.  CT of the abdomen and pelvis with contrast shows bladder wall thickening.  Fast vesicular fat stranding compatible with cystitis, decreased enhancement of the ventral aspect of the right upper kidney concerning for pyelonephritis, stable bilateral nonobstructing renal calculi, aortic atherosclerosis.  In the emergency room patient given vancomycin, cefepime, metronidazole.  Review of Systems:  Review of Systems  Constitutional:  Positive for chills, fever and malaise/fatigue.  HENT:  Positive for hearing loss.   Eyes: Negative.   Respiratory: Negative.    Cardiovascular: Negative.   Gastrointestinal:  Positive for diarrhea and nausea.  Genitourinary:  Positive for dysuria and hematuria.  Neurological: Negative.   Psychiatric/Behavioral: Negative.      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 [name], [place] [facility] in [place]   Intracranial bleed [facility])    december 2010   Left leg DVT [facility]) 09/2019   Migraines    "2 in my life" (09/17/2012)   Mitral regurgitation    a. 01/2019 Echo: EF 55-60%, mild conc LVH. RVSP 59.56mmHg. Mildly dil LA. Mild to mod MR. Mild AI.   PAH (pulmonary artery hypertension) (HCC)    Permanent atrial fibrillation ([place])    a. CHA2DS2VASc = 5-->No OAC 2/2 h/o SAH; b. 03/2019 [facility]: permanent Afib - 78 bpm (46-135). Occas PVCs (1.4% burden).   Retina disorder    hx of torn retina   Stroke ([place]) 2002   LLE "just a  little bit weaker" (09/17/2012)   Subarachnoid hemorrhage ([place]) 08/2009    Past Surgical History:  Procedure Laterality Date   BLEPHAROPLASTY  1990's   "right eye" (09/16/2012)   CARDIOVASCULAR STRESS TEST     nl perfusion w/o ischemia, EF 56%, bradycardia without syncope 04/15/10 (Dr. Bartholome Bill)   CATARACT EXTRACTION W/ INTRAOCULAR LENS IMPLANT  1980's   "right" (09/17/2012)   COLONOSCOPY     06/26/09   EYE MUSCLE SURGERY  1980's   "right eye" (09/17/2012)   INGUINAL HERNIA REPAIR  04/10/2011   "right" (09/17/2012)   JOINT REPLACEMENT     ORIF ANKLE FRACTURE Left 08/19/2021   Procedure: OPEN REDUCTION INTERNAL FIXATION (ORIF) ANKLE FRACTURE;  Surgeon: Thornton Park, MD;  Location: [facility] ORS;  Service: Orthopedics;  Laterality: Left;   PERIPHERAL VASCULAR THROMBECTOMY Left 10/27/2019   Procedure: PERIPHERAL VASCULAR THROMBECTOMY;  Surgeon: Algernon Huxley, MD;  Location: [facility];  Service: Cardiovascular;  Laterality: Left;   POSTERIOR LUMBAR FUSION  09/16/2012   "L4-5" (09/17/2012)   RETINAL DETACHMENT SURGERY  1980's   "right" (09/17/2012)   TOTAL KNEE ARTHROPLASTY  04/28/2010   "left" (09/17/2012)     reports that he has never smoked. He has never used smokeless tobacco. He reports that he does not drink alcohol and does not use drugs.  Allergies  Allergen Reactions   Codeine Nausea Only and Other (See Comments)   Fentanyl Other (See Comments)    Given before knee surgery, blood pressure became extremely low   Regadenoson Other (See Comments)    Created cardiac arrest   Verapamil Other (See Comments)    Per patient: Medication has caused low heart rate and caused patient to have afib that he currently has    Family History  Problem Relation Age of Onset   Heart disease Mother     Prior to Admission medications   Medication Sig Start Date End Date Taking? Authorizing Provider  acetaminophen (TYLENOL) 325 MG tablet Take 1-2 tablets (325-650 mg total) by  mouth every 6 (six) hours as needed for mild pain (pain score 1-3 or temp > 100.5). 08/23/21   Jennye Boroughs, MD  aliskiren (TEKTURNA) 300 MG tablet Take 300 mg by mouth daily.     [provider]  amoxicillin (AMOXIL) 500 MG tablet Take 500 mg by mouth as directed. Dental procedure only    [provider]  carbamazepine (TEGRETOL) 200 MG tablet Take 200 mg by mouth 2 (two) times a day.     [provider]  docusate sodium (COLACE) 100 MG capsule Take 1 capsule (100 mg total) by mouth 2 (two) times daily. 08/23/21   Jennye Boroughs, MD  doxazosin (CARDURA) 1 MG tablet Take 1 mg by mouth at bedtime.     [provider]  ELIQUIS 5 MG TABS tablet Take 5 mg by mouth 2 (two) times daily. 10/06/19   [provider]  furosemide (LASIX) 20 MG tablet Take 2 tablets (40 mg total) by mouth daily. 05/27/19   Theora Gianotti, NP  HYDROcodone-acetaminophen (NORCO/VICODIN) 5-325 MG tablet Take 1 tablet by mouth every 6 (six) hours as needed for moderate pain. 08/23/21   Jennye Boroughs, MD  irbesartan (AVAPRO) 300 MG tablet Take 300 mg by mouth daily. 11/19/17   [provider]  Omega-3 Fatty Acids (FISH OIL) 1200 MG CAPS Take 1,200 mg by mouth daily.    [provider]  pantoprazole (PROTONIX) 40 MG tablet Take 40 mg by mouth at bedtime.     [provider]  senna (SENOKOT) 8.6 MG TABS tablet Take 1 tablet (8.6 mg total) by mouth 2 (two) times daily. 08/23/21   Jennye Boroughs, MD  simvastatin (ZOCOR) 20 MG tablet Take 20 mg by mouth 2 (two) times daily.     [provider]  vitamin B-12 (CYANOCOBALAMIN) 1000 MCG tablet Take 1,000 mcg by mouth daily.    [provider]    Physical Exam: Vitals:   09/05/21 1700 09/05/21 1715 09/05/21 1900 09/05/21 1947  BP: (!) 123/59  (!) 101/53 128/63  Pulse: 100 91 87 89  Resp: 16 (!) 22 15   Temp:    98.6 F (37 C)  TempSrc:    Oral  SpO2: 92% 93% 95% 96%  Weight:       Height:       Physical Exam Vitals reviewed.  Constitutional:      General: He is not in acute distress.    Appearance: He is not ill-appearing.  HENT:     Head: Normocephalic and atraumatic.     Right Ear: External ear normal.     Left Ear: External ear normal.     Nose: Nose normal.     Mouth/Throat:     Mouth: Mucous membranes are moist.  Eyes:     Extraocular Movements: Extraocular movements intact.     Pupils: Pupils are equal, round, and reactive to light.  Cardiovascular:     Rate and Rhythm: Normal rate. Rhythm irregular.  Pulmonary:     Effort: Pulmonary effort is normal.     Breath sounds: Normal breath sounds.  Abdominal:     General: Bowel sounds are normal. There is no distension.     Palpations: Abdomen is soft. There is no mass.     Tenderness: There is no abdominal tenderness. There is no guarding.     Hernia: No hernia is present.  Musculoskeletal:     Right lower leg: No edema.     Comments: Cast in the left leg from a recent surgical procedure.  Neurological:     General: No focal deficit present.     Mental Status: He is alert and oriented to person, place, and time.  Psychiatric:        Mood and Affect: Mood normal.        Behavior: Behavior normal.     Labs on Admission: I have personally reviewed following labs and imaging studies  No results for input(s): CKTOTAL, CKMB, TROPONINI in the last 72 hours. Lab Results  Component Value Date   WBC 26.0 (H) 09/05/2021   HGB 10.6 (L) 09/05/2021   HCT 35.7 (L) 09/05/2021   MCV 77.3 (L) 09/05/2021   PLT 268 09/05/2021    Recent Labs  Lab 09/05/21  1521  NA 134*  K 3.7  CL 96*  CO2 27  BUN 23  CREATININE 1.31*  CALCIUM 9.3  PROT 7.3  BILITOT 1.1  ALKPHOS 140*  ALT 19  AST 30  GLUCOSE 137*   COVID-19 Labs No results for input(s): DDIMER, FERRITIN, LDH, CRP in the last 72 hours. Lab Results  Component Value Date   SARSCOV2NAA NEGATIVE 09/05/2021   [place] NEGATIVE 08/23/2021    [place] NEGATIVE 08/16/2021   [place] NEGATIVE 10/23/2019    Radiological Exams on Admission: CT Abdomen Pelvis W Contrast  Result Date: 09/05/2021 CLINICAL DATA:  Sepsis, recent urinary catheter removal, nausea/vomiting/diarrhea, febrile EXAM: CT ABDOMEN AND PELVIS WITH CONTRAST TECHNIQUE: Multidetector CT imaging of the abdomen and pelvis was performed using the standard protocol following bolus administration of intravenous contrast. CONTRAST:  160mL OMNIPAQUE IOHEXOL 300 MG/ML  SOLN COMPARISON:  08/16/2021 FINDINGS: Lower chest: Stable cardiomegaly. No acute pleural or parenchymal lung disease. Hepatobiliary: No focal liver abnormality is seen. No gallstones, gallbladder wall thickening, or biliary dilatation. Pancreas: Unremarkable. No pancreatic ductal dilatation or surrounding inflammatory changes. Spleen: Normal in size without focal abnormality. Adrenals/Urinary Tract: Stable right adrenal myelolipoma. Mild nodular thickening of the left adrenal gland is unchanged. There is subtle heterogeneous decreased enhancement within the ventral aspect of the upper pole right kidney, concerning for focal pyelonephritis. No evidence of renal abscess. Left kidney enhances normally. Stable punctate bilateral nonobstructing renal calculi. No obstructive uropathy within either kidney. The bladder is decompressed, with nonspecific bladder wall thickening. Mild perivesicular fat stranding suggests cystitis. Stomach/Bowel: No bowel obstruction or ileus. No bowel wall thickening or inflammatory change. Vascular/Lymphatic: Aortic atherosclerosis. No enlarged abdominal or pelvic lymph nodes. Reproductive: Prostate is unremarkable. Other: No free fluid or free gas.  No abdominal wall hernia. Musculoskeletal: No acute or destructive bony lesions. Stable postsurgical changes lower lumbar spine. Reconstructed images demonstrate no additional findings. IMPRESSION: 1. Bladder wall thickening and perivesicular fat  stranding compatible with cystitis. 2. Subtle heterogeneous decreased enhancement ventral aspect upper pole right kidney, concerning for focal pyelonephritis. No renal abscess. 3. Stable bilateral nonobstructing renal calculi. 4.  Aortic Atherosclerosis (ICD10-I70.0). Electronically Signed   By: Randa Ngo M.D.   On: 09/05/2021 18:01   DG Chest Portable 1 View  Result Date: 09/05/2021 CLINICAL DATA:  Sepsis, onset of nausea, vomiting, diarrhea and urinary pain 2 hours following urinary catheter removal on Friday, fever to 103 degrees EXAM: PORTABLE CHEST 1 VIEW COMPARISON:  Portable exam 1631 hours compared to 08/16/2021 FINDINGS: Rotated to the LEFT. Borderline enlargement of cardiac silhouette. Mediastinal contours and pulmonary vascularity normal. Lungs clear. No pulmonary infiltrate, pleural effusion, or pneumothorax. No acute osseous findings. IMPRESSION: No acute abnormalities. Electronically Signed   By: Lavonia Dana M.D.   On: 09/05/2021 16:50    EKG: Independently reviewed.  Atrial fibrillation.  Assessment/Plan: Principal Problem:   Dysuria Active Problems:   Severe sepsis (HCC)   Acute pyelonephritis   AKI (acute kidney injury) ([place])   Seizures ([place])   History of cerebral hemorrhage   Occult GI bleeding   Anemia   Atrial fibrillation (HCC)   Stroke (HCC)   DVT (deep venous thrombosis) (HCC)   Hypertension   Elevated glucose    Dysuria/severe sepsis/acute pyelonephritis/acute kidney injury: Patient presents with acute urinary retention last week which was resolved with Foley placement and has an upcoming urology appointment but after Foley removal on Friday started to have fevers and chills difficulty urinating incomplete emptying and hence is here today.  Patient today meets criteria for severe sepsis from pyelonephritis and resultant acute kidney injury.  We will admit him to progressive care unit for his severe sepsis.  We will continue with IV hydration, along with bolus  IV fluid therapy.  We will also continue patient on IV antibiotics with vancomycin cefepime and metronidazole has been started in the emergency room pharmacy consult for medication and renal dose. AKI: Lab Results  Component Value Date   CREATININE 1.31 (H) 09/05/2021   CREATININE 0.84 08/24/2021   CREATININE 0.89 08/21/2021  Suspect acute kidney injury to be a combination of pyelonephritis, medications, hypotension, and sepsis. We will hold patient's ACE inhibitor and blood pressure medication regimen.  Seizures: Seizure precautions. Continue patient's Tegretol and obtain Tegretol level. No seizure since 1989 per wife and no etiology identified and has had multiple evaluations with neurologist.   History of cerebral hemorrhage: Patient states Eliquis has been cleared by providers that he takes for his atrial fibrillation.  Anemia/history of GI bleed:  08/16/21 14:11 08/16/21 22:06 08/17/21 04:20 08/17/21 09:47 08/17/21 14:34 08/17/21 21:20 08/19/21 03:36 08/20/21 05:51 08/21/21 05:12 08/24/21 23:55 09/05/21 15:21  Hemoglobin 7.3 (L) 7.5 (L) 7.6 (L) 8.5 (L) 8.8 (L) 8.3 (L) 8.1 (L) 8.2 (L) 8.7 (L) 9.3 (L) 10.6 (L)  Suspect anemia from chronic GI bleed from in addition to ongoing anticoagulation for atrial fibrillation.  Anemia panel, IV PPI, type and screen, Eliquis is currently continued.  Atrial fibrillation/history of stroke/DVT: Patient continued on Eliquis, currently heart rate is controlled.   Hypertension: Blood pressure 128/63, pulse 89, temperature 98.6 F (37 C), temperature source Oral, resp. rate 15, height 6' (1.829 m), weight 97.5 kg, SpO2 96 %. Blood pressure soft secondary to severe sepsis and hypotension and elevated lactic therefore blood pressure regiment medication regimen held currently.  Elevated glucose: Suspect patient is a diabetic we will obtain an A1c.  Elevated troponin: Initial troponin of 61 and repeat of   DVT prophylaxis:  Eliquis  Code  Status:  Full code  Family Communication:  Fallou, Hulbert (Spouse)  [phone] (Mobile)   Disposition Plan:  Home  Consults called:  Urologist-Dr. Bernardo Heater  Admission status: Inpatient   Para Skeans MD [facility] [phone] How to contact the [facility] Attending or Consulting provider [place] or covering provider during after hours [place], for this patient.    Check the care team in [facility] and look for a) attending/consulting [facility] provider listed and b) the [facility] team listed Log into [email or web address] and use [place]'s universal password to access. If you do not have the password, please contact the hospital operator. Locate the [facility] provider you are looking for under [facility] and page to a number that you can be directly reached. If you still have difficulty reaching the provider, please page the [facility] (Director on Call) for the Hospitalists listed on amion for assistance. [email or web address] Password [facility] 09/05/2021, 8:29 PM

## 2021-09-06 ENCOUNTER — Inpatient Hospital Stay (HOSPITAL_COMMUNITY)
Admit: 2021-09-06 | Discharge: 2021-09-06 | Disposition: A | Discharge status: Home / Self Care | Payer: Medicare Other | Attending: Internal Medicine | Admitting: Internal Medicine

## 2021-09-06 ENCOUNTER — Inpatient Hospital Stay: Payer: Medicare Other

## 2021-09-06 DIAGNOSIS — I10 Essential (primary) hypertension: Secondary | ICD-10-CM

## 2021-09-06 DIAGNOSIS — R778 Other specified abnormalities of plasma proteins: Secondary | ICD-10-CM

## 2021-09-06 LAB — CBC
HCT: 28.8 % — ABNORMAL LOW (ref 39.0–52.0)
HCT: 26.2 % — ABNORMAL LOW (ref 39.0–52.0)
HCT: 28 % — ABNORMAL LOW (ref 39.0–52.0)
Hemoglobin: 8.7 g/dL — ABNORMAL LOW (ref 13.0–17.0)
Hemoglobin: 8 g/dL — ABNORMAL LOW (ref 13.0–17.0)
Hemoglobin: 8.9 g/dL — ABNORMAL LOW (ref 13.0–17.0)
MCH: 23 pg — ABNORMAL LOW (ref 26.0–34.0)
MCH: 23.3 pg — ABNORMAL LOW (ref 26.0–34.0)
MCH: 24.7 pg — ABNORMAL LOW (ref 26.0–34.0)
MCHC: 30.2 g/dL (ref 30.0–36.0)
MCHC: 30.5 g/dL (ref 30.0–36.0)
MCHC: 31.8 g/dL (ref 30.0–36.0)
MCV: 76 fL — ABNORMAL LOW (ref 80.0–100.0)
MCV: 76.2 fL — ABNORMAL LOW (ref 80.0–100.0)
MCV: 77.6 fL — ABNORMAL LOW (ref 80.0–100.0)
Platelets: 176 10*3/uL (ref 150–400)
Platelets: 168 10*3/uL (ref 150–400)
Platelets: 152 10*3/uL (ref 150–400)
RBC: 3.79 MIL/uL — ABNORMAL LOW (ref 4.22–5.81)
RBC: 3.44 MIL/uL — ABNORMAL LOW (ref 4.22–5.81)
RBC: 3.61 MIL/uL — ABNORMAL LOW (ref 4.22–5.81)
RDW: 22.5 % — ABNORMAL HIGH (ref 11.5–15.5)
RDW: 22.4 % — ABNORMAL HIGH (ref 11.5–15.5)
RDW: 22.5 % — ABNORMAL HIGH (ref 11.5–15.5)
WBC: 26.4 10*3/uL — ABNORMAL HIGH (ref 4.0–10.5)
WBC: 26.9 10*3/uL — ABNORMAL HIGH (ref 4.0–10.5)
WBC: 19.7 10*3/uL — ABNORMAL HIGH (ref 4.0–10.5)
nRBC: 0 % (ref 0.0–0.2)
nRBC: 0 % (ref 0.0–0.2)
nRBC: 0 % (ref 0.0–0.2)

## 2021-09-06 LAB — BLOOD CULTURE ID PANEL (REFLEXED) - BCID2

## 2021-09-06 LAB — COMPREHENSIVE METABOLIC PANEL
ALT: 17 U/L (ref 0–44)
ALT: 17 U/L (ref 0–44)
AST: 30 U/L (ref 15–41)
AST: 33 U/L (ref 15–41)
Albumin: 2.4 g/dL — ABNORMAL LOW (ref 3.5–5.0)
Albumin: 2.7 g/dL — ABNORMAL LOW (ref 3.5–5.0)
Alkaline Phosphatase: 120 U/L (ref 38–126)
Alkaline Phosphatase: 99 U/L (ref 38–126)
Anion gap: 5 (ref 5–15)
Anion gap: 7 (ref 5–15)
BUN: 27 mg/dL — ABNORMAL HIGH (ref 8–23)
BUN: 29 mg/dL — ABNORMAL HIGH (ref 8–23)
CO2: 25 mmol/L (ref 22–32)
CO2: 26 mmol/L (ref 22–32)
Calcium: 7.8 mg/dL — ABNORMAL LOW (ref 8.9–10.3)
Calcium: 8.4 mg/dL — ABNORMAL LOW (ref 8.9–10.3)
Chloride: 100 mmol/L (ref 98–111)
Chloride: 100 mmol/L (ref 98–111)
Creatinine, Ser: 1.23 mg/dL (ref 0.61–1.24)
Creatinine, Ser: 1.26 mg/dL — ABNORMAL HIGH (ref 0.61–1.24)
GFR, Estimated: 57 mL/min — ABNORMAL LOW (ref >60)
GFR, Estimated: 59 mL/min — ABNORMAL LOW (ref >60)
Glucose, Bld: 115 mg/dL — ABNORMAL HIGH (ref 70–99)
Glucose, Bld: 152 mg/dL — ABNORMAL HIGH (ref 70–99)
Potassium: 3.3 mmol/L — ABNORMAL LOW (ref 3.5–5.1)
Potassium: 3.9 mmol/L (ref 3.5–5.1)
Sodium: 131 mmol/L — ABNORMAL LOW (ref 135–145)
Sodium: 132 mmol/L — ABNORMAL LOW (ref 135–145)
Total Bilirubin: 1.1 mg/dL (ref 0.3–1.2)
Total Bilirubin: 1.3 mg/dL — ABNORMAL HIGH (ref 0.3–1.2)
Total Protein: 5.2 g/dL — ABNORMAL LOW (ref 6.5–8.1)
Total Protein: 5.6 g/dL — ABNORMAL LOW (ref 6.5–8.1)

## 2021-09-06 LAB — TROPONIN I (HIGH SENSITIVITY)
Troponin I (High Sensitivity): 233 ng/L (ref <18)
Troponin I (High Sensitivity): 406 ng/L (ref <18)
Troponin I (High Sensitivity): 417 ng/L (ref <18)
Troponin I (High Sensitivity): 624 ng/L (ref <18)

## 2021-09-06 LAB — URINALYSIS, ROUTINE W REFLEX MICROSCOPIC
Bacteria, UA: NONE SEEN
Bilirubin Urine: NEGATIVE
Glucose, UA: NEGATIVE mg/dL
Ketones, ur: 5 mg/dL — AB
Nitrite: NEGATIVE
Protein, ur: 100 mg/dL — AB
RBC / HPF: >50 RBC/hpf — ABNORMAL HIGH (ref 0–5)
Specific Gravity, Urine: >1.046 — ABNORMAL HIGH (ref 1.005–1.030)
Squamous Epithelial / HPF: NONE SEEN (ref 0–5)
WBC, UA: >50 WBC/hpf — ABNORMAL HIGH (ref 0–5)
pH: 5 (ref 5.0–8.0)

## 2021-09-06 LAB — BASIC METABOLIC PANEL
Anion gap: 4 — ABNORMAL LOW (ref 5–15)
BUN: 29 mg/dL — ABNORMAL HIGH (ref 8–23)
CO2: 25 mmol/L (ref 22–32)
Calcium: 7.8 mg/dL — ABNORMAL LOW (ref 8.9–10.3)
Chloride: 103 mmol/L (ref 98–111)
Creatinine, Ser: 1.14 mg/dL (ref 0.61–1.24)
GFR, Estimated: >60 mL/min (ref >60)
Glucose, Bld: 103 mg/dL — ABNORMAL HIGH (ref 70–99)
Potassium: 3.7 mmol/L (ref 3.5–5.1)
Sodium: 132 mmol/L — ABNORMAL LOW (ref 135–145)

## 2021-09-06 LAB — HEPARIN LEVEL (UNFRACTIONATED): Heparin Unfractionated: >1.1 IU/mL — ABNORMAL HIGH (ref 0.30–0.70)

## 2021-09-06 LAB — APTT
aPTT: 51 seconds — ABNORMAL HIGH (ref 24–36)
aPTT: >200 seconds (ref 24–36)

## 2021-09-06 LAB — PROTIME-INR
INR: 2.2 — ABNORMAL HIGH (ref 0.8–1.2)
Prothrombin Time: 24.3 seconds — ABNORMAL HIGH (ref 11.4–15.2)

## 2021-09-06 LAB — VITAMIN B12: Vitamin B-12: 655 pg/mL (ref 180–914)

## 2021-09-06 LAB — LACTIC ACID, PLASMA
Lactic Acid, Venous: 1.4 mmol/L (ref 0.5–1.9)
Lactic Acid, Venous: 3.5 mmol/L (ref 0.5–1.9)

## 2021-09-06 LAB — MAGNESIUM
Magnesium: 1.6 mg/dL — ABNORMAL LOW (ref 1.7–2.4)
Magnesium: 2.3 mg/dL (ref 1.7–2.4)

## 2021-09-06 LAB — BRAIN NATRIURETIC PEPTIDE: B Natriuretic Peptide: 367.4 pg/mL — ABNORMAL HIGH (ref 0.0–100.0)

## 2021-09-06 LAB — PHOSPHORUS
Phosphorus: 1.5 mg/dL — ABNORMAL LOW (ref 2.5–4.6)
Phosphorus: 5 mg/dL — ABNORMAL HIGH (ref 2.5–4.6)

## 2021-09-06 LAB — PREPARE RBC (CROSSMATCH)

## 2021-09-06 LAB — PROCALCITONIN: Procalcitonin: 42.04 ng/mL

## 2021-09-06 LAB — CBG MONITORING, ED: Glucose-Capillary: 118 mg/dL — ABNORMAL HIGH (ref 70–99)

## 2021-09-06 MED ORDER — HEPARIN (PORCINE) 25000 UT/250ML-% IV SOLN
1300.0000 [IU]/h | INTRAVENOUS | Status: DC
Start: 2021-09-06 — End: 2021-09-06

## 2021-09-06 MED ORDER — POTASSIUM PHOSPHATES 15 MMOLE/5ML IV SOLN
30.0000 mmol | Freq: Once | INTRAVENOUS | Status: AC
  Administered 2021-09-06: 30 mmol via INTRAVENOUS
  Filled 2021-09-06: qty 10

## 2021-09-06 MED ORDER — ACETAMINOPHEN 500 MG PO TABS
1000.0000 mg | ORAL_TABLET | Freq: Four times a day (QID) | ORAL | Status: AC | PRN
  Administered 2021-09-06: 1000 mg via ORAL
  Filled 2021-09-06: qty 2

## 2021-09-06 MED ORDER — MORPHINE SULFATE (PF) 2 MG/ML IV SOLN
1.0000 mg | INTRAVENOUS | Status: DC | PRN
  Administered 2021-09-06 – 2021-09-07 (×3): 1 mg via INTRAVENOUS
  Filled 2021-09-06 (×3): qty 1

## 2021-09-06 MED ORDER — ONDANSETRON 4 MG PO TBDP
4.0000 mg | ORAL_TABLET | Freq: Four times a day (QID) | ORAL | Status: DC | PRN
  Administered 2021-09-07 – 2021-09-10 (×3): 4 mg via ORAL
  Filled 2021-09-06 (×4): qty 1

## 2021-09-06 MED ORDER — APIXABAN 5 MG PO TABS: 5.0000 mg | ORAL_TABLET | Freq: Two times a day (BID) | ORAL | Status: DC

## 2021-09-06 MED ORDER — HEPARIN (PORCINE) 25000 UT/250ML-% IV SOLN
900.0000 [IU]/h | INTRAVENOUS | Status: DC
  Administered 2021-09-06: 900 [IU]/h via INTRAVENOUS
  Filled 2021-09-06: qty 250

## 2021-09-06 MED ORDER — SODIUM CHLORIDE 0.9 % IV SOLN: 1.0000 g | Freq: Three times a day (TID) | INTRAVENOUS | Status: DC

## 2021-09-06 MED ORDER — ACETAMINOPHEN 650 MG RE SUPP
650.0000 mg | RECTAL | Status: DC | PRN
  Administered 2021-09-06: 650 mg via RECTAL
  Filled 2021-09-06: qty 1

## 2021-09-06 MED ORDER — HEPARIN (PORCINE) 25000 UT/250ML-% IV SOLN
1200.0000 [IU]/h | INTRAVENOUS | Status: DC
Start: 2021-09-06 — End: 2021-09-06
  Administered 2021-09-06: 1200 [IU]/h via INTRAVENOUS
  Filled 2021-09-06: qty 250

## 2021-09-06 MED ORDER — SODIUM CHLORIDE 0.9% IV SOLUTION
Freq: Once | INTRAVENOUS | Status: AC
  Filled 2021-09-06: qty 250

## 2021-09-06 MED ORDER — MAGNESIUM SULFATE 2 GM/50ML IV SOLN
2.0000 g | Freq: Once | INTRAVENOUS | Status: AC
  Administered 2021-09-06: 2 g via INTRAVENOUS
  Filled 2021-09-06: qty 50

## 2021-09-06 MED ORDER — ASPIRIN EC 325 MG PO TBEC
325.0000 mg | DELAYED_RELEASE_TABLET | Freq: Once | ORAL | Status: AC
  Administered 2021-09-06: 325 mg via ORAL
  Filled 2021-09-06: qty 1

## 2021-09-06 MED ORDER — CEFEPIME HCL 2 G IJ SOLR
2.0000 g | Freq: Two times a day (BID) | INTRAMUSCULAR | Status: DC
  Administered 2021-09-06: 2 g via INTRAVENOUS
  Filled 2021-09-06: qty 2

## 2021-09-06 MED ORDER — NOREPINEPHRINE 4 MG/250ML-% IV SOLN
INTRAVENOUS | Status: AC
  Filled 2021-09-06: qty 250

## 2021-09-06 NOTE — ED Notes (Signed)
Phlebotomy at bedside.

## 2021-09-06 NOTE — Progress Notes (Incomplete)
09/06/21 11:01 AM   [name] 1940/10/27 [id number]  Referring provider:  Jerl Mina, MD [address] [place],  [place] [address] No chief complaint on file.    HPI: [name] is a 80 y.o.male who presents today for further evaluation of urinary retention.   He has a personal history of AFIB on Eliquis and seizures.   He was seen recently in the ED on 08/24/2021 with intermittent spasms of pain in his suprapubic area. Urinalysis showed moderate blood. He was catheterized.  Foley catheter was subsequently removed by provider at [facility]   He was seen again in the ED on 09/05/2021 for nausea, vomiting, diarrhea, and dysuria. He was found to be sepsis. CT abdomen and pelvis revealed stable right adrenal myelolipoma. Mild nodular thickening of the left adrenal gland is unchanged. There is subtle heterogeneous decreased enhancement within the ventral aspect of the upper pole right kidney, concerning for focal pyelonephritis. No evidence of renal abscess. Left kidney enhances normally. Stable punctate bilateral nonobstructing renal calculi. No obstructive uropathy within either kidney. The bladder is decompressed, with nonspecific bladder wall thickening. 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 Jerl Mina, [facility] in [place]   Intracranial bleed [facility])    december 2010   Left leg DVT (HCC) 09/2019   Migraines    "2 in my life" (09/17/2012)   Mitral regurgitation    a. 01/2019 Echo: EF 55-60%, mild conc LVH. RVSP 59.46mmHg. Mildly dil LA. Mild to mod MR. Mild AI.   PAH (pulmonary artery hypertension) (HCC)    Permanent atrial fibrillation (HCC)    a. CHA2DS2VASc = 5-->No OAC 2/2 h/o SAH; b. 03/2019 Zio: permanent Afib - 78 bpm (46-135). Occas PVCs (1.4% burden).   Retina disorder    hx of torn retina   Stroke (HCC) 2002   LLE "just a little bit weaker" (09/17/2012)   Subarachnoid hemorrhage (HCC) 08/2009    Surgical History: Past Surgical History:  Procedure Laterality Date   BLEPHAROPLASTY  1990's   "right eye" (09/16/2012)   CARDIOVASCULAR STRESS TEST     nl perfusion w/o ischemia, EF 56%, bradycardia without syncope 04/15/10 (Dr. Harold Hedge)   CATARACT EXTRACTION W/ INTRAOCULAR LENS IMPLANT  1980's   "right" (09/17/2012)   COLONOSCOPY     06/26/09   EYE MUSCLE SURGERY  1980's   "right eye" (09/17/2012)   INGUINAL HERNIA REPAIR  04/10/2011   "right" (09/17/2012)   JOINT REPLACEMENT     ORIF ANKLE FRACTURE Left 08/19/2021   Procedure: OPEN REDUCTION INTERNAL FIXATION (ORIF) ANKLE FRACTURE;  Surgeon: Juanell Fairly, MD;  Location: [facility] ORS;  Service: Orthopedics;  Laterality: Left;   PERIPHERAL VASCULAR THROMBECTOMY Left 10/27/2019   Procedure: PERIPHERAL VASCULAR THROMBECTOMY;  Surgeon: Annice Needy, MD;  Location: [facility] INVASIVE CV LAB;  Service: Cardiovascular;  Laterality: Left;   POSTERIOR LUMBAR FUSION  09/16/2012   "L4-5" (09/17/2012)   RETINAL DETACHMENT SURGERY  1980's   "right" (09/17/2012)   TOTAL KNEE ARTHROPLASTY  04/28/2010   "left" (09/17/2012)    Home Medications:  Allergies as of 09/07/2021       Reactions   Codeine Nausea Only, Other (See Comments)   Fentanyl Other (See Comments)   Given before knee surgery, blood pressure became extremely low    Regadenoson Other (See Comments)   Created cardiac arrest   Verapamil Other (See Comments)   Per patient: Medication has caused low heart rate and caused patient to have afib that he currently has        Medication List      Notice   This visit is during an admission. Changes to the med list made in this visit will be reflected in the After Visit Summary of the admission.     Allergies:  Allergies  Allergen Reactions   Codeine Nausea Only and Other (See Comments)   Fentanyl Other (See Comments)    Given before knee surgery, blood pressure became extremely low   Regadenoson Other (See Comments)    Created cardiac arrest   Verapamil Other (See Comments)    Per patient: Medication has caused low heart rate and caused patient to have afib that he currently has    Family History: Family History  Problem Relation Age of Onset   Heart disease Mother     Social History:  reports that he has never smoked. He has never used smokeless tobacco. He reports that he does not drink alcohol and does not use drugs.   Physical Exam: There were no vitals taken for this visit.  Constitutional:  Alert and oriented, No acute distress. HEENT: [place] AT, moist mucus membranes.  Trachea midline, no masses. Cardiovascular: No clubbing, cyanosis, or edema. Respiratory: Normal respiratory effort, no increased work of breathing. Skin: No rashes, bruises or suspicious lesions. Neurologic: Grossly intact, no focal deficits, moving all 4 extremities. Psychiatric: Normal mood and affect.  Laboratory Data:  Lab Results  Component Value Date   CREATININE 1.26 (H) 09/06/2021    Urinalysis   Pertinent Imaging: CLINICAL DATA:  Sepsis, recent urinary catheter removal, nausea/vomiting/diarrhea, febrile   EXAM: CT ABDOMEN AND PELVIS WITH CONTRAST   TECHNIQUE: Multidetector CT imaging of the abdomen and pelvis was performed using the standard protocol following bolus administration  of intravenous contrast.   CONTRAST:  OMNIPAQUE IOHEXOL 300 MG/ML  SOLN   COMPARISON:  08/16/2021   FINDINGS: Lower chest: Stable cardiomegaly. No acute pleural or parenchymal lung disease.   Hepatobiliary: No focal liver abnormality is seen. No gallstones, gallbladder wall thickening, or biliary dilatation.   Pancreas: Unremarkable. No pancreatic ductal dilatation or surrounding inflammatory changes.   Spleen: Normal in size without focal abnormality.   Adrenals/Urinary Tract: Stable right adrenal myelolipoma. Mild nodular thickening of the left adrenal gland is unchanged.   There is subtle heterogeneous decreased enhancement within the ventral aspect of the upper pole right kidney, concerning for focal pyelonephritis. No evidence of renal abscess. Left kidney enhances normally. Stable punctate bilateral nonobstructing renal calculi. No obstructive uropathy within either kidney.   The bladder is decompressed, with nonspecific bladder wall thickening. Mild perivesicular fat stranding suggests cystitis.   Stomach/Bowel: No bowel obstruction or ileus. No bowel wall thickening or inflammatory change.   Vascular/Lymphatic: Aortic  atherosclerosis. No enlarged abdominal or pelvic lymph nodes.   Reproductive: Prostate is unremarkable.   Other: No free fluid or free gas.  No abdominal wall hernia.   Musculoskeletal: No acute or destructive bony lesions. Stable postsurgical changes lower lumbar spine. Reconstructed images demonstrate no additional findings.   IMPRESSION: 1. Bladder wall thickening and perivesicular fat stranding compatible with cystitis. 2. Subtle heterogeneous decreased enhancement ventral aspect upper pole right kidney, concerning for focal pyelonephritis. No renal abscess. 3. Stable bilateral nonobstructing renal calculi. 4.  Aortic Atherosclerosis (ICD10-I70.0).     Electronically Signed   By: Sharlet Salina M.D.   On: 09/05/2021 18:01      Assessment & Plan:     No follow-ups on file.  I,Kailey Littlejohn,acting as a Neurosurgeon for Vanna Scotland, MD.,have documented all relevant documentation on the behalf of Vanna Scotland, MD,as directed by  Vanna Scotland, MD while in the presence of Vanna Scotland, MD.  [facility] [address], [address] [place], [place] [address] [phone]

## 2021-09-06 NOTE — Progress Notes (Addendum)
Notified Dr. Sedalia Muta of elevated troponin level. Patient is resting without any complications or complaints of pain.

## 2021-09-06 NOTE — Progress Notes (Signed)
Patient temp increased to 102 rectally, notified Dr. Allena Katz, received orders for tylenol. Unable to maintain pulse ox above 90% on 4 liters, intially requested respiratory therapist to place on HFNC, Dr. Allena Katz ordered bipap and to  discontinue fluids.   Patient has periods of confusion but is able to answer some questions appropriately.    Requested to hold metoprolol due to low blood pressure readings, Dr.Patel verbally agreed.   Notified Dr.Patel of electrolyte imbalance and elevated troponin level. New orders were placed to correct.   NP Rust-Chester was at bedside to consult.  Blood pressure dropped below 90 systole, so ivf were initiated per MD orders.  Chest x-ray was performed.

## 2021-09-06 NOTE — Consult Note (Addendum)
Pharmacy Antibiotic Note  [name] is a 80 y.o. male admitted on 09/05/2021 with  urosepsis in setting of recent urinary instrumentation (catheter removal) .  Patient initially placed on broad spectrum coverage with cefepime and vancomycin. Given severity of illness, broadening further by changing cefepime to meropenem. Pharmacy has been consulted for meropenem and vancomycin dosing.  Addendum: Given no history of ESBL - discussed with MD and okay with transitioning back to Cefepime while awaiting urine culture results.  Georgina Pillion, PharmD, BCPS 10:44 AM    Plan: Resume Cefepime 2g IV every 12 hours  Vancomycin 750 mg IV q12h --Calculated AUC: 461, Cmin 15.1 --Daily Scr per protocol --Levels at steady state as clinically indicated  Height: 6' (182.9 cm) Weight: 97.5 kg (215 lb) IBW/kg (Calculated) : 77.6  Temp (24hrs), Avg:100.6 F (38.1 C), Min:98.6 F (37 C), Max:103 F (39.4 C)  Recent Labs  Lab 09/05/21 1520 09/05/21 1521 09/05/21 1528 09/05/21 2121 09/05/21 2344 09/06/21 0225 09/06/21 0707  WBC  --  26.0*  --   --   --  26.4* 26.9*  CREATININE  --  1.31*  --   --  1.23  --  1.26*  LATICACIDVEN 4.1*  --  3.1* 6.4* 3.5*  --   --     Estimated Creatinine Clearance: 55.7 mL/min (A) (by C-G formula based on SCr of 1.26 mg/dL (H)).    Allergies  Allergen Reactions   Codeine Nausea Only and Other (See Comments)   Fentanyl Other (See Comments)    Given before knee surgery, blood pressure became extremely low   Regadenoson Other (See Comments)    Created cardiac arrest   Verapamil Other (See Comments)    Per patient: Medication has caused low heart rate and caused patient to have afib that he currently has    Antimicrobials this admission: Metronidazole 12/12 x 1 Cefepime 12/12 >> 12/13 Vancomycin 12/12 >>  Meropenem 12/13 >>   Dose adjustments this admission: N/A  Microbiology results: 12/12 BCx: NGTD 12/13 Ucx (collected after initiation of  antimicrobials): pending   Thank you for allowing pharmacy to be a part of this patients care.  Tressie Ellis 09/06/2021 9:48 AM

## 2021-09-06 NOTE — Consult Note (Signed)
ANTICOAGULATION CONSULT NOTE  Pharmacy Consult for IV Heparin Indication: chest pain/ACS  Patient Measurements: Height: 6' (182.9 cm) Weight: 97.5 kg (215 lb) IBW/kg (Calculated) : 77.6 Heparin Dosing Weight: 97.2 kg  Labs: Recent Labs    09/05/21 1521 09/05/21 1536 09/05/21 2344 09/06/21 0225 09/06/21 0707 09/06/21 1401 09/06/21 1837  HGB 10.6*  --   --  8.7* 8.0* 8.9*  --   HCT 35.7*  --   --  28.8* 26.2* 28.0*  --   PLT 268  --   --  176 168 152  --   APTT  --   --   --  51*  --   --  >200*  LABPROT 20.4*  --   --  24.3*  --   --   --   INR 1.7*  --   --  2.2*  --   --   --   HEPARINUNFRC  --   --   --  >1.10*  --   --   --   CREATININE 1.31*  --  1.23  --  1.26* 1.14  --   TROPONINIHS  --    < > 406* 624* 417* 233*  --    < > = values in this interval not displayed.     Estimated Creatinine Clearance: 61.5 mL/min (by C-G formula based on SCr of 1.14 mg/dL).   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 Jerl Mina, [facility] in [place]   Intracranial bleed  [facility])    december 2010   Left leg DVT (HCC) 09/2019   Migraines    "2 in my life" (09/17/2012)   Mitral regurgitation    a. 01/2019 Echo: EF 55-60%, mild conc LVH. RVSP 59.36mmHg. Mildly dil LA. Mild to mod MR. Mild AI.   PAH (pulmonary artery hypertension) (HCC)    Permanent atrial fibrillation (HCC)    a. CHA2DS2VASc = 5-->No OAC 2/2 h/o SAH; b. 03/2019 Zio: permanent Afib - 78 bpm (46-135). Occas  PVCs (1.4% burden).   Retina disorder    hx of torn retina   Stroke (HCC) 2002   LLE "just a little bit weaker" (09/17/2012)   Subarachnoid hemorrhage (HCC) 08/2009    Medications:  Apixaban 5 mg BID prior to admission. This was continued upon admission with last dose 12/12 at 2147  Assessment: Patient is an 80 y/o M with medical history as above and including Afib and history of VTE, CVA, SAH who is admitted with urosepsis in setting of recent urinary instrumentation (catheter removal). Patient is on apixaban prior to admission for Afib / history of VTE. Troponins elevated (406 >> 624 >> 417). Pharmacy consulted to initiate heparin infusion for suspected ACS. Apixaban has been placed on hold.  Baseline heparin level > 1.1, INR 2.2, aPTT 51s. CBC notable for down-trending Hgb & platelets which is likely dilutive.   Date Time HL/aPTT Rate/Comment  12/13 1837 >  200s  Supratherapeutic 1200 > 900 units/hr  Goal of Therapy:  aPTT 66-102 seconds Monitor platelets by anticoagulation protocol: Yes   Plan:  -- Heparin supratherapeutic (aPTT > 200s) -- Notified nurse - -will hold Heparin infusion for 1 hour -- Resume at reduced rate of 900 units/hr  -- Recheck aPTT/HL in 8 hours following rate change -- Plan to follow aPTT for now given interference of apixaban on anti-Xa level. Can switch over to anti-Xa monitoring when correlation is established --Baseline elevated aPTT will make heparin monitoring challenging. Suspect elevation in setting of sepsis induced coagulopathy vs DIC vs possible anticoagulation induced effect --Daily CBC per protocol; monitor closely for bleeding  Sharen Hones, PharmD, BCPS Clinical Pharmacist   09/06/2021,7:49 PM

## 2021-09-06 NOTE — ED Notes (Addendum)
Report received from Isaac, RN 

## 2021-09-06 NOTE — Progress Notes (Signed)
ANTICOAGULATION CONSULT NOTE   Pharmacy Consult for heparin infusion Indication: NSTEMI  Allergies  Allergen Reactions   Codeine Nausea Only and Other (See Comments)   Fentanyl Other (See Comments)    Given before knee surgery, blood pressure became extremely low   Regadenoson Other (See Comments)    Created cardiac arrest   Verapamil Other (See Comments)    Per patient: Medication has caused low heart rate and caused patient to have afib that he currently has    Patient Measurements: Height: 6' (182.9 cm) Weight: 97.5 kg (215 lb) IBW/kg (Calculated) : 77.6 Heparin Dosing Weight: 97.2 kg  Vital Signs: Temp: 99.7 F (37.6 C) (12/12 2046) Temp Source: Oral (12/12 2100) BP: 100/55 (12/13 0000) Pulse Rate: 107 (12/13 0000)  Labs: Recent Labs    09/05/21 1521 09/05/21 1536 09/05/21 2344 09/06/21 0225  HGB 10.6*  --   --  8.7*  HCT 35.7*  --   --  28.8*  PLT 268  --   --  176  APTT  --   --   --  51*  LABPROT 20.4*  --   --  24.3*  INR 1.7*  --   --  2.2*  HEPARINUNFRC  --   --   --  >1.10*  CREATININE 1.31*  --  1.23  --   TROPONINIHS  --  61* 406* 624*    Estimated Creatinine Clearance: 57 mL/min (by C-G formula based on SCr of 1.23 mg/dL).   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 Jerl Mina, [facility] in [place]   Intracranial bleed [facility])    december 2010   Left leg DVT (HCC) 09/2019   Migraines    "2 in my life" (09/17/2012)   Mitral  regurgitation    a. 01/2019 Echo: EF 55-60%, mild conc LVH. RVSP 59.35mmHg. Mildly dil LA. Mild to mod MR. Mild AI.   PAH (pulmonary artery hypertension) (HCC)    Permanent atrial fibrillation (HCC)    a. CHA2DS2VASc = 5-->No OAC 2/2 h/o SAH; b. 03/2019 Zio: permanent Afib - 78 bpm (46-135). Occas PVCs (1.4% burden).   Retina disorder    hx of torn retina   Stroke (HCC) 2002   LLE "just a little bit weaker" (09/17/2012)   Subarachnoid hemorrhage (HCC) 08/2009    Medications:  PTA med ordered inpatient: Eliquis 5 mg BID, last dose 12/12 @ 2147  Assessment/Plan D/C DOAC.   Ordered BL labs.   No bolus. Ordered heparin infusion to start at 1300 unit/hr Admitting provider D/C heparin consult, prior to start of infusion [place] provider regarding restart of DOAC, which was not restarted. DOAC reordered per provider.  Otelia Sergeant, PharmD, [facility] 09/06/2021 3:37 AM

## 2021-09-06 NOTE — Progress Notes (Signed)
Update 2:11 AM: Pt hypotensive with SBP in 88. Oxygen at 80's.  Cheryll Cockayne at bedside and we will cont ivf and if bp drops or pt becomes hypoxic Then we will move to icu for vasopressors and diuresis.  Guaiac positive.  Heparin not started as troponin is under 500 and conservative medical management will be beneficial for patient to prevent active bleeding however it next one rises wil consider heparin gtt and cardiology consult.

## 2021-09-06 NOTE — Progress Notes (Addendum)
PROGRESS NOTE    [name]  HDQ:[id number] DOB: January 13, 1941 DOA: 09/05/2021 PCP: Jerl Mina, MD     Brief Narrative:  From admission h and p [name] is a 80 y.o. male seen in ed with complaints of  Dysuria fever and chills since Friday evening.Pt is not able to start a stream.  Pt also reports diarrhea. Incomplete emptying.  Patient has dementia and is able to give [place] history but briefly.  And was seen about a week ago for acute urinary retention and had a Foley placed and was discharged with urology follow-up which he has yet to do as the appointment is upcoming.  Currently patient denies any headaches blurred vision chest pain shortness of breath nausea vomiting.  Patient states that he has had an intracranial hemorrhage and had to have neurosurgery at [facility].  He is on Eliquis currently and has been cleared through his cardiologist.     Assessment & Plan:   Principal Problem:   Dysuria Active Problems:   Atrial fibrillation (HCC)   Hypertension   Seizures (HCC)   Stroke (HCC)   DVT (deep venous thrombosis) (HCC)   History of cerebral hemorrhage   Occult GI bleeding   Severe sepsis (HCC)   Anemia   AKI (acute kidney injury) (HCC)   Elevated glucose   Acute pyelonephritis  # Complicated UTI # Severe sepsis # Urinary retention Several weeks retention beginning during recent hospitalization. Severe sepsis by hr, temp, elevated lactate. Ct with signs cystitis, possible pyelo. No obstructing stone or abscess. Hemodynamically stable - continue stepdown status for now - place foley - continue vanc, substitute meropenem for cefepime - continue IVF  # NSTEMI Suspect demand as no ischemic changes on ekg, no chest pain. Hx normal troponins, here peaked at 624, most recently 417. Noted concern for recent GIB. - TTE pending - start heparin per acs nomogram, close monitor for bleeding, dropping hgb - continue to trend troponin  # Anemia Stool guaiac positive  on recent hospitalization and found to be anemia. Received 2 units that hospitalization. No overt bleeding that hospitalization so did not get further w/u. No report of melena or hematochezia or other bleeding. H initially 10.6 now 8, suspect initial was somewhat hemoconcentrated and current is somehwhat diluted - monitor - will transfuse 1 unit given critical illness, possible nstemi  # Left hip pain Acute on chronic. No sig abnormality seen on ct of abdomen/pelvis  # Left ankle fracture S/p operative repair. Is in boot. Coming from snf - ortho f/u 1/6 - pt consult when acute illness improving  # A fib In a fib - resume eliquis when hgb stable  # Seizure disorder - home degretal  # HTN Here bp wnl to low - hold home antihypertensives    DVT prophylaxis: heparin Code Status: full Family Communication: son updated @ bedside 12/13  Level of care: Stepdown Status is: Inpatient  Remains inpatient appropriate because: severity of illness        Consultants:  none  Procedures: none  Antimicrobials:  Vanc/cefepime>vanc/mero    Subjective: Feels ill, fatigued. Left hip pain.  Objective: Vitals:   09/06/21 0500 09/06/21 0600 09/06/21 0700 09/06/21 0900  BP: (!) 98/50 108/60 105/68 107/68  Pulse:  78 72 83  Resp: 15 19 19 17   Temp:      TempSrc:      SpO2: 96% 95% 98% 96%  Weight:      Height:       No  intake or output data in the 24 hours ending 09/06/21 0930 Filed Weights   09/05/21 1518  Weight: 97.5 kg    Examination:  General exam: Appears in mild distress Respiratory system: Clear to auscultation. Respiratory effort normal. Cardiovascular system: S1 & S2 heard, irreg irreg, distant heart sounds Gastrointestinal system: Abdomen is obese, soft and nontender. No organomegaly or masses felt. Normal bowel sounds heard. Central nervous system: Alert and oriented. No focal neurological deficits. Extremities: Symmetric 5 x 5 power. Cast on left  foot Skin: No rashes, lesions or ulcers Psychiatry: Judgement and insight appear normal. Mood & affect appropriate.     Data Reviewed: I have personally reviewed following labs and imaging studies  CBC: Recent Labs  Lab 09/05/21 1521 09/06/21 0225 09/06/21 0707  WBC 26.0* 26.4* 26.9*  NEUTROABS 23.6*  --   --   HGB 10.6* 8.7* 8.0*  HCT 35.7* 28.8* 26.2*  MCV 77.3* 76.0* 76.2*  PLT 268 176 168   Basic Metabolic Panel: Recent Labs  Lab 09/05/21 1521 09/05/21 2344 09/06/21 0707  NA 134* 132* 131*  K 3.7 3.3* 3.9  CL 96* 100 100  CO2 27 25 26   GLUCOSE 137* 115* 152*  BUN 23 27* 29*  CREATININE 1.31* 1.23 1.26*  CALCIUM 9.3 8.4* 7.8*  MG  --  1.6* 2.3  PHOS  --  1.5* 5.0*   GFR: Estimated Creatinine Clearance: 55.7 mL/min (A) (by C-G formula based on SCr of 1.26 mg/dL (H)). Liver Function Tests: Recent Labs  Lab 09/05/21 1521 09/05/21 2344 09/06/21 0707  AST 30 30 33  ALT 19 17 17   ALKPHOS 140* 120 99  BILITOT 1.1 1.3* 1.1  PROT 7.3 5.6* 5.2*  ALBUMIN 3.4* 2.7* 2.4*   Recent Labs  Lab 09/05/21 1536  LIPASE 25   No results for input(s): AMMONIA in the last 168 hours. Coagulation Profile: Recent Labs  Lab 09/05/21 1521 09/06/21 0225  INR 1.7* 2.2*   Cardiac Enzymes: No results for input(s): CKTOTAL, CKMB, CKMBINDEX, TROPONINI in the last 168 hours. BNP (last 3 results) No results for input(s): PROBNP in the last 8760 hours. HbA1C: No results for input(s): HGBA1C in the last 72 hours. CBG: Recent Labs  Lab 09/06/21 0230  GLUCAP 118*   Lipid Profile: No results for input(s): CHOL, HDL, LDLCALC, TRIG, CHOLHDL, LDLDIRECT in the last 72 hours. Thyroid Function Tests: Recent Labs    09/05/21 2121  TSH 1.536  FREET4 0.87   Anemia Panel: Recent Labs    09/05/21 2120 09/05/21 2121  VITAMINB12  --  655  FOLATE  --  8.8  FERRITIN  --  72  TIBC  --  263  IRON  --  17*  RETICCTPCT 1.1  --    Urine analysis:    Component Value Date/Time    COLORURINE YELLOW (A) 09/06/2021 0225   APPEARANCEUR CLOUDY (A) 09/06/2021 0225   LABSPEC >1.046 (H) 09/06/2021 0225   PHURINE 5.0 09/06/2021 0225   GLUCOSEU NEGATIVE 09/06/2021 0225   HGBUR LARGE (A) 09/06/2021 0225   BILIRUBINUR NEGATIVE 09/06/2021 0225   KETONESUR 5 (A) 09/06/2021 0225   PROTEINUR 100 (A) 09/06/2021 0225   NITRITE NEGATIVE 09/06/2021 0225   LEUKOCYTESUR LARGE (A) 09/06/2021 0225   Sepsis Labs: @LABRCNTIP (procalcitonin:4,lacticidven:4)  ) Recent Results (from the past 240 hour(s))  Culture, blood (Routine x 2)     Status: None (Preliminary result)   Collection Time: 09/05/21  3:20 PM   Specimen: BLOOD  Result Value Ref Range Status  Specimen Description BLOOD LEFT ANTECUBITAL  Final   Special Requests   Final    BOTTLES DRAWN AEROBIC AND ANAEROBIC Blood Culture adequate volume   Culture   Final    NO GROWTH < 24 HOURS Performed at [facility], [address] [place], [place] [address]    Report Status PENDING  Incomplete  Culture, blood (Routine x 2)     Status: None (Preliminary result)   Collection Time: 09/05/21  3:20 PM   Specimen: BLOOD  Result Value Ref Range Status   Specimen Description BLOOD BLOOD LEFT ARM  Final   Special Requests   Final    BOTTLES DRAWN AEROBIC AND ANAEROBIC Blood Culture adequate volume   Culture   Final    NO GROWTH < 24 HOURS Performed at [facility], [address] [place], [place] [address]    Report Status PENDING  Incomplete  Resp Panel by RT-PCR (Flu A&B, Covid) Nasopharyngeal Swab     Status: None   Collection Time: 09/05/21  3:21 PM   Specimen: Nasopharyngeal Swab; Nasopharyngeal(NP) swabs in vial transport medium  Result Value Ref Range Status   SARS Coronavirus 2 by RT PCR NEGATIVE NEGATIVE Final    Comment: (NOTE) SARS-CoV-2 target nucleic acids are NOT DETECTED.  The SARS-CoV-2 RNA is generally detectable in upper respiratory specimens during the acute phase of infection. The  lowest concentration of SARS-CoV-2 viral copies this assay can detect is 138 copies/mL. A negative result does not preclude SARS-Cov-2 infection and should not be used as the sole basis for treatment or other patient management decisions. A negative result may occur with  improper specimen collection/handling, submission of specimen other than nasopharyngeal swab, presence of viral mutation(s) within the areas targeted by this assay, and inadequate number of viral copies(<138 copies/mL). A negative result must be combined with clinical observations, patient history, and epidemiological information. The expected result is Negative.  Fact Sheet for Patients:  BloggerCourse.com  Fact Sheet for Healthcare Providers:  SeriousBroker.it  This test is no t yet approved or cleared by the [place] FDA and  has been authorized for detection and/or diagnosis of SARS-CoV-2 by FDA under an Emergency Use Authorization (EUA). This EUA will remain  in effect (meaning this test can be used) for the duration of the COVID-19 declaration under Section 564(b)(1) of the Act, 21 U.S.C.section 360bbb-3(b)(1), unless the authorization is terminated  or revoked sooner.       Influenza A by PCR NEGATIVE NEGATIVE Final   Influenza B by PCR NEGATIVE NEGATIVE Final    Comment: (NOTE) The Xpert Xpress SARS-CoV-2/FLU/RSV plus assay is intended as an aid in the diagnosis of influenza from Nasopharyngeal swab specimens and should not be used as a sole basis for treatment. Nasal washings and aspirates are unacceptable for Xpert Xpress SARS-CoV-2/FLU/RSV testing.  Fact Sheet for Patients: BloggerCourse.com  Fact Sheet for Healthcare Providers: SeriousBroker.it  This test is not yet approved or cleared by the [place] FDA and has been authorized for detection and/or diagnosis of SARS-CoV-2 by FDA under  an Emergency Use Authorization (EUA). This EUA will remain in effect (meaning this test can be used) for the duration of the COVID-19 declaration under Section 564(b)(1) of the Act, 21 U.S.C. section 360bbb-3(b)(1), unless the authorization is terminated or revoked.  Performed at [facility], [address] [place], [place] [address]          Radiology Studies: DG Chest 1 View  Result Date: 09/06/2021 CLINICAL  DATA:  Acute respiratory failure. EXAM: CHEST  1 VIEW COMPARISON:  Chest radiograph dated 09/05/2021. FINDINGS: There is cardiomegaly with mild central vascular congestion. No focal consolidation, pleural effusion or pneumothorax. No acute osseous pathology. IMPRESSION: Cardiomegaly with mild central vascular congestion. Electronically Signed   By: Elgie Collard M.D.   On: 09/06/2021 02:21   CT HEAD WO CONTRAST ( )  Result Date: 09/05/2021 CLINICAL DATA:  Mental status change, unknown cause EXAM: CT HEAD WITHOUT CONTRAST TECHNIQUE: Contiguous axial images were obtained from the base of the skull through the vertex without intravenous contrast. COMPARISON:  08/16/2021 FINDINGS: Brain: There is atrophy and chronic small vessel disease changes. No acute intracranial abnormality. Specifically, no hemorrhage, hydrocephalus, mass lesion, acute infarction, or significant intracranial injury. Vascular: No hyperdense vessel or unexpected calcification. Skull: No acute calvarial abnormality. Sinuses/Orbits: No acute findings Other: None IMPRESSION: Atrophy, chronic microvascular disease. No acute intracranial abnormality. Electronically Signed   By: Charlett Nose M.D.   On: 09/05/2021 23:29   CT Abdomen Pelvis W Contrast  Result Date: 09/05/2021 CLINICAL DATA:  Sepsis, recent urinary catheter removal, nausea/vomiting/diarrhea, febrile EXAM: CT ABDOMEN AND PELVIS WITH CONTRAST TECHNIQUE: Multidetector CT imaging of the abdomen and pelvis was performed using the standard  protocol following bolus administration of intravenous contrast. CONTRAST:  OMNIPAQUE IOHEXOL 300 MG/ML  SOLN COMPARISON:  08/16/2021 FINDINGS: Lower chest: Stable cardiomegaly. No acute pleural or parenchymal lung disease. Hepatobiliary: No focal liver abnormality is seen. No gallstones, gallbladder wall thickening, or biliary dilatation. Pancreas: Unremarkable. No pancreatic ductal dilatation or surrounding inflammatory changes. Spleen: Normal in size without focal abnormality. Adrenals/Urinary Tract: Stable right adrenal myelolipoma. Mild nodular thickening of the left adrenal gland is unchanged. There is subtle heterogeneous decreased enhancement within the ventral aspect of the upper pole right kidney, concerning for focal pyelonephritis. No evidence of renal abscess. Left kidney enhances normally. Stable punctate bilateral nonobstructing renal calculi. No obstructive uropathy within either kidney. The bladder is decompressed, with nonspecific bladder wall thickening. Mild perivesicular fat stranding suggests cystitis. Stomach/Bowel: No bowel obstruction or ileus. No bowel wall thickening or inflammatory change. Vascular/Lymphatic: Aortic atherosclerosis. No enlarged abdominal or pelvic lymph nodes. Reproductive: Prostate is unremarkable. Other: No free fluid or free gas.  No abdominal wall hernia. Musculoskeletal: No acute or destructive bony lesions. Stable postsurgical changes lower lumbar spine. Reconstructed images demonstrate no additional findings. IMPRESSION: 1. Bladder wall thickening and perivesicular fat stranding compatible with cystitis. 2. Subtle heterogeneous decreased enhancement ventral aspect upper pole right kidney, concerning for focal pyelonephritis. No renal abscess. 3. Stable bilateral nonobstructing renal calculi. 4.  Aortic Atherosclerosis (ICD10-I70.0). Electronically Signed   By: Sharlet Salina M.D.   On: 09/05/2021 18:01   DG Chest Portable 1 View  Result Date:  09/05/2021 CLINICAL DATA:  Sepsis, onset of nausea, vomiting, diarrhea and urinary pain 2 hours following urinary catheter removal on Friday, fever to 103 degrees EXAM: PORTABLE CHEST 1 VIEW COMPARISON:  Portable exam 1631 hours compared to 08/16/2021 FINDINGS: Rotated to the LEFT. Borderline enlargement of cardiac silhouette. Mediastinal contours and pulmonary vascularity normal. Lungs clear. No pulmonary infiltrate, pleural effusion, or pneumothorax. No acute osseous findings. IMPRESSION: No acute abnormalities. Electronically Signed   By: Ulyses Southward M.D.   On: 09/05/2021 16:50        Scheduled Meds:  [START ON 09/07/2021] apixaban  5 mg Oral BID   carbamazepine  200 mg Oral BID   doxazosin  1 mg Oral QHS   metoprolol tartrate  5 mg  Intravenous Once   pantoprazole (PROTONIX) IV  40 mg Intravenous Q12H   Continuous Infusions:  sodium chloride 125 mL/hr at 09/06/21 0813   ceFEPime (MAXIPIME) IV Stopped (09/06/21 0405)   vancomycin Stopped (09/06/21 0808)     LOS: 1 day    Time spent: 60 min    Silvano Bilis, MD [facility]   If 7PM-7AM, please contact night-coverage [email or web address] Password [email or web address] 09/06/2021, 9:30 AM

## 2021-09-06 NOTE — Consult Note (Signed)
ANTICOAGULATION CONSULT NOTE  Pharmacy Consult for IV Heparin Indication: chest pain/ACS  Patient Measurements: Height: 6' (182.9 cm) Weight: 97.5 kg (215 lb) IBW/kg (Calculated) : 77.6 Heparin Dosing Weight: 97.2 kg  Labs: Recent Labs    09/05/21 1521 09/05/21 1536 09/05/21 2344 09/06/21 0225 09/06/21 0707  HGB 10.6*  --   --  8.7* 8.0*  HCT 35.7*  --   --  28.8* 26.2*  PLT 268  --   --  176 168  APTT  --   --   --  51*  --   LABPROT 20.4*  --   --  24.3*  --   INR 1.7*  --   --  2.2*  --   HEPARINUNFRC  --   --   --  >1.10*  --   CREATININE 1.31*  --  1.23  --  1.26*  TROPONINIHS  --    < > 406* 624* 417*   < > = values in this interval not displayed.    Estimated Creatinine Clearance: 55.7 mL/min (A) (by C-G formula based on SCr of 1.26 mg/dL (H)).   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 Jerl Mina, [facility] in [place]   Intracranial bleed [facility])    december 2010   Left leg DVT (HCC) 09/2019   Migraines    "2 in my life" (09/17/2012)   Mitral regurgitation    a. 01/2019 Echo: EF 55-60%, mild conc LVH. RVSP 59.50mmHg. Mildly dil LA. Mild to mod MR. Mild AI.   PAH (pulmonary artery hypertension) (HCC)    Permanent atrial fibrillation (HCC)    a. CHA2DS2VASc = 5-->No OAC 2/2 h/o SAH; b. 03/2019 Zio: permanent Afib - 78 bpm (46-135). Occas PVCs (1.4% burden).   Retina disorder    hx of torn retina   Stroke (HCC) 2002   LLE "just a little bit weaker"  (09/17/2012)   Subarachnoid hemorrhage (HCC) 08/2009    Medications:  Apixaban 5 mg BID prior to admission. This was continued upon admission with last dose 12/12 at 2147  Assessment: Patient is an 80 y/o M with medical history as above and including Afib and history of VTE, CVA, SAH who is admitted with urosepsis in setting of recent urinary instrumentation (catheter removal). Patient is on apixaban prior to admission for Afib / history of VTE. Troponins elevated (406 >> 624 >> 417). Pharmacy consulted to initiate heparin infusion for suspected ACS. Apixaban has been placed on hold.  Baseline heparin level > 1.1, INR 2.2, aPTT 51s. CBC notable for down-trending Hgb & platelets which is likely dilutive.   Goal of Therapy:  aPTT 66-102 seconds Monitor platelets by anticoagulation protocol: Yes   Plan:  --Start heparin at 1200 units/hr with no bolus --Check aPTT 8 hours after initiation of infusion; plan to follow aPTT for now given interference of apixaban on anti-Xa level. Can switch over to anti-Xa monitoring when  correlation is established --Baseline elevated aPTT will make heparin monitoring challenging. Suspect elevation in setting of sepsis induced coagulopathy vs DIC vs possible anticoagulation induced effect --Daily CBC per protocol; monitor closely for bleeding  Tressie Ellis 09/06/2021,9:54 AM

## 2021-09-06 NOTE — ED Notes (Addendum)
PTT  >200--MD notified and aware. Pharmacy to change heparin dosage.

## 2021-09-06 NOTE — Progress Notes (Addendum)
[facility] [employer] Note - no charge  Received nursing secure chat that patient's guaiac is positive.   Chart review:  UA: large leukocytes Iron: 17, Iron saturation is 7, ferritin is 72 WBC 26 and increased to 26.4.  H/H was 10.6/35.7 to 8.7/28.8 Plt was 268 to 176 Lactic acid: 4.1 to 3.1 to 6.4 to 3.5   Repeat HS was 406 and increased to 624  Vitals: T 99.7, HR 91, RR 19, BP 95/56 (from 80/51), spO2 is 100% on HFNC  # Severe sepsis with cardiac and respiratory organ involvement # Hypotension responsive to IVF  - Treat with fluid and antibiotics per admitting team plan - Continue to monitor HS troponin - Per nursing staff, patient denies chest pain - Appreciate ICU following and further recommendations   # Elevated troponin - presumed NSTEMI secondary to severe sepsis - Heparin discontinued at this time, repeat HS Trop ordered - complete echo ordered - AM team to consider cardiology consultation pending echo result - Complete echo from 02/17/19 was read as Ejection fraction 55-60%. Left ventricular diastolic doppler parameters are indeterminate. LA mildly dilated. RA is normal in size. RVSP moderately elevated with estimated pressure of 595 mmHg.   # Mild anemia - consistent with ida - Repeat H/H declined to 8.7 from 10.6, platelet on repeat was 176 from 268, without reported BRBPR, this is presumed secondary to hemodilution in setting of IVF - Per nursing staff, patient stool is green in color - Low clinical suspicion for upper or lower GI bleed at this time  - CBC in AM  Dr. Sedalia Muta

## 2021-09-07 ENCOUNTER — Inpatient Hospital Stay: Payer: Medicare Other

## 2021-09-07 ENCOUNTER — Ambulatory Visit: Payer: Self-pay | Admitting: Urology

## 2021-09-07 ENCOUNTER — Encounter: Payer: Self-pay | Admitting: Internal Medicine

## 2021-09-07 ENCOUNTER — Other Ambulatory Visit: Payer: Self-pay

## 2021-09-07 DIAGNOSIS — N390 Urinary tract infection, site not specified: Secondary | ICD-10-CM | POA: Diagnosis present

## 2021-09-07 LAB — ECHOCARDIOGRAM COMPLETE
Height: 72 in
P 1/2 time: 325 msec
S' Lateral: 2.85 cm
Weight: 3439.99 oz

## 2021-09-07 LAB — APTT: aPTT: 79 seconds — ABNORMAL HIGH (ref 24–36)

## 2021-09-07 LAB — URINE CULTURE: Culture: NO GROWTH

## 2021-09-07 LAB — TYPE AND SCREEN
ABO/RH(D): O POS
Antibody Screen: NEGATIVE
Unit division: 0

## 2021-09-07 LAB — HEMOGLOBIN A1C
Hgb A1c MFr Bld: 5.8 % — ABNORMAL HIGH (ref 4.8–5.6)
Mean Plasma Glucose: 120 mg/dL

## 2021-09-07 LAB — BPAM RBC
Blood Product Expiration Date: 202301022359
ISSUE DATE / TIME: 202212131011
Unit Type and Rh: 5100

## 2021-09-07 LAB — CBC
HCT: 28.9 % — ABNORMAL LOW (ref 39.0–52.0)
Hemoglobin: 8.9 g/dL — ABNORMAL LOW (ref 13.0–17.0)
MCH: 24.1 pg — ABNORMAL LOW (ref 26.0–34.0)
MCHC: 30.8 g/dL (ref 30.0–36.0)
MCV: 78.1 fL — ABNORMAL LOW (ref 80.0–100.0)
Platelets: 150 10*3/uL (ref 150–400)
RBC: 3.7 MIL/uL — ABNORMAL LOW (ref 4.22–5.81)
RDW: 22.5 % — ABNORMAL HIGH (ref 11.5–15.5)
WBC: 19.1 10*3/uL — ABNORMAL HIGH (ref 4.0–10.5)
nRBC: 0 % (ref 0.0–0.2)

## 2021-09-07 LAB — HEPARIN LEVEL (UNFRACTIONATED): Heparin Unfractionated: >1.1 IU/mL — ABNORMAL HIGH (ref 0.30–0.70)

## 2021-09-07 LAB — CREATININE, SERUM
Creatinine, Ser: 1.14 mg/dL (ref 0.61–1.24)
GFR, Estimated: >60 mL/min (ref >60)

## 2021-09-07 MED ORDER — GABAPENTIN 600 MG PO TABS
300.0000 mg | ORAL_TABLET | Freq: Two times a day (BID) | ORAL | Status: DC
  Administered 2021-09-07 (×2): 300 mg via ORAL
  Filled 2021-09-07 (×2): qty 1

## 2021-09-07 MED ORDER — TAMSULOSIN HCL 0.4 MG PO CAPS
0.4000 mg | ORAL_CAPSULE | Freq: Every day | ORAL | Status: DC
  Administered 2021-09-07 – 2021-09-13 (×7): 0.4 mg via ORAL
  Filled 2021-09-07 (×7): qty 1

## 2021-09-07 MED ORDER — OXYBUTYNIN CHLORIDE 5 MG PO TABS
5.0000 mg | ORAL_TABLET | Freq: Three times a day (TID) | ORAL | Status: DC | PRN
  Filled 2021-09-07: qty 1

## 2021-09-07 MED ORDER — ACETAMINOPHEN 325 MG PO TABS
650.0000 mg | ORAL_TABLET | Freq: Four times a day (QID) | ORAL | Status: DC | PRN
  Administered 2021-09-10 (×2): 650 mg via ORAL
  Filled 2021-09-07 (×2): qty 2

## 2021-09-07 MED ORDER — OXYCODONE HCL 5 MG PO TABS
5.0000 mg | ORAL_TABLET | ORAL | Status: DC | PRN
  Administered 2021-09-08 (×2): 10 mg via ORAL
  Filled 2021-09-07 (×2): qty 2

## 2021-09-07 MED ORDER — SODIUM CHLORIDE 0.9 % IV SOLN
2.0000 g | INTRAVENOUS | Status: DC
  Administered 2021-09-07 – 2021-09-09 (×3): 2 g via INTRAVENOUS
  Filled 2021-09-07: qty 20
  Filled 2021-09-07: qty 2
  Filled 2021-09-07 (×2): qty 20

## 2021-09-07 MED ORDER — APIXABAN 5 MG PO TABS
5.0000 mg | ORAL_TABLET | Freq: Two times a day (BID) | ORAL | Status: DC
  Administered 2021-09-07 – 2021-09-13 (×14): 5 mg via ORAL
  Filled 2021-09-07 (×14): qty 1

## 2021-09-07 NOTE — Progress Notes (Signed)
Patient received from ED via bed.  Patient is awake, alert and oriented x 4. Wife and son at bedside.  Indwelling foley catheter in place draining clear, amber urine.  Left lower leg hard cast dry and intact, toes mobile and warm to touch.  Patient stated he has decreased sensation to his left leg due to neuropathy.  Assisted in position of comfort.  Oriented to room and unit routine.  Call bell within reach. Needs addressed.

## 2021-09-07 NOTE — Consult Note (Signed)
ANTICOAGULATION CONSULT NOTE  Pharmacy Consult for IV Heparin Indication: chest pain/ACS  Patient Measurements: Height: 6' (182.9 cm) Weight: 97.5 kg (215 lb) IBW/kg (Calculated) : 77.6 Heparin Dosing Weight: 97.2 kg  Labs: Recent Labs    09/05/21 1521 09/05/21 1536 09/06/21 0225 09/06/21 0707 09/06/21 1401 09/06/21 1837 09/07/21 0701  HGB 10.6*  --  8.7* 8.0* 8.9*  --  8.9*  HCT 35.7*  --  28.8* 26.2* 28.0*  --  28.9*  PLT 268  --  176 168 152  --  150  APTT  --   --  51*  --   --  >200* 79*  LABPROT 20.4*  --  24.3*  --   --   --   --   INR 1.7*  --  2.2*  --   --   --   --   HEPARINUNFRC  --   --  >1.10*  --   --   --  >1.10*  CREATININE 1.31*   < >  --  1.26* 1.14  --  1.14  TROPONINIHS  --    < > 624* 417* 233*  --   --    < > = values in this interval not displayed.     Estimated Creatinine Clearance: 61.5 mL/min (by C-G formula based on SCr of 1.14 mg/dL).   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 Jerl Mina, [facility] in [place]   Intracranial bleed [facility])    december 2010   Left leg DVT (HCC) 09/2019   Migraines    "2 in my life" (09/17/2012)   Mitral regurgitation    a. 01/2019 Echo: EF 55-60%, mild conc LVH. RVSP 59.8mmHg. Mildly dil LA. Mild to mod MR. Mild AI.   PAH (pulmonary artery hypertension) (HCC)    Permanent atrial fibrillation (HCC)    a. CHA2DS2VASc = 5-->No OAC 2/2 h/o SAH; b. 03/2019 Zio: permanent Afib - 78 bpm (46-135). Occas  PVCs (1.4% burden).   Retina disorder    hx of torn retina   Stroke (HCC) 2002   LLE "just a little bit weaker" (09/17/2012)   Subarachnoid hemorrhage (HCC) 08/2009    Medications:  Apixaban 5 mg BID prior to admission. This was continued upon admission with last dose 12/12 at 2147  Assessment: Patient is an 80 y/o M with medical history as above and including Afib and history of VTE, CVA, SAH who is admitted with urosepsis in setting of recent urinary instrumentation (catheter removal). Patient is on apixaban prior to admission for Afib / history of VTE. Troponins elevated (406 >> 624 >> 417). Pharmacy consulted to initiate heparin infusion for suspected ACS. Apixaban has been placed on hold.  Baseline heparin level > 1.1, INR 2.2, aPTT 51s. CBC notable for down-trending Hgb & platelets which is likely dilutive.   Date Time HL/aPTT Rate/Comment  12/13 1837 > 200s  Supratherapeutic 1200 > 900 units/hr  12/14 0701 aPTT=79, HL >1.10 Thera  Goal of Therapy:  aPTT 66-102 seconds Monitor platelets by anticoagulation protocol: Yes   Plan:  --12/14  0701  aPTT=79, HL >1.10 therapeutic (not correlating yet) -- continue current rate of 900 units/hr -- check confirmatory aPTT in 8 hours  -- Plan to follow aPTT for now given interference of apixaban on anti-Xa level. Can switch over to anti-Xa monitoring when correlation is established --Baseline elevated aPTT will make heparin monitoring challenging. Suspect elevation in setting of sepsis induced coagulopathy vs DIC vs possible anticoagulation induced effect --Daily CBC per protocol; monitor closely for bleeding  Angelique Blonder, PharmD Clinical Pharmacist   09/07/2021,8:11 AM

## 2021-09-07 NOTE — Progress Notes (Signed)
PROGRESS NOTE    [name]  JEH:[id number] DOB: 1941/04/28 DOA: 09/05/2021 PCP: [name], MD    Brief Narrative:  80 y.o. male seen in ed with complaints of  Dysuria fever and chills since Friday evening.Pt is not able to start a stream.  Pt also reports diarrhea. Incomplete emptying.  Patient has dementia and is able to give [place] history but briefly.  And was seen about a week ago for acute urinary retention and had a Foley placed and was discharged with urology follow-up which he has yet to do as the appointment is upcoming.  Currently patient denies any headaches blurred vision chest pain shortness of breath nausea vomiting.  Patient states that he has had an intracranial hemorrhage and had to have neurosurgery at [facility].  He is on Eliquis currently and has been cleared through his cardiologist.  The following morning patient states that he has some pain in the hip and left leg status post fracture.  No specific complaints of abdominal pain on my evaluation however per son at bedside patient had excruciating abdominal pain prior to presentation.  Blood cultures now positive for E. coli   Assessment & Plan:   Principal Problem:   Dysuria Active Problems:   Atrial fibrillation (HCC)   Hypertension   Seizures (HCC)   Stroke (HCC)   DVT (deep venous thrombosis) (HCC)   History of cerebral hemorrhage   Occult GI bleeding   Severe sepsis (HCC)   Anemia   AKI (acute kidney injury) ([place])   Elevated glucose   Acute pyelonephritis   Complicated UTI (urinary tract infection)  Complicated urinary tract infection E. coli bacteremia Urinary retention Severe sepsis secondary to above Retention followed hospitalization and subsequent rehab stay status post left leg fracture Severe sepsis met by heart rate, temperature, elevated lactic acid CT abdomen with cystitis and possible [place] No stone or abscess Hemodynamics reassuring Plan: DC vancomycin and cefepime De-escalate to  MedSurg IV Rocephin, 2 g daily IVF for now Continue Foley catheter Start nightly Flomax  Demand ischemia/elevated troponins ACS ruled out No changes on EKG, no chest pain Troponin peaked at 624, likely secondary to sepsis Plan: DC heparin Follow-up TTE Outpatient cardiology follow-up  Acute on chronic anemia No indication for transfusion currently  Left hip pain Acute on chronic, suspect referred pain from left ankle fracture As needed pain control Check plain film x-ray, rule out fracture  Left ankle fracture Status post operative repair.  Now in cast.  Coming from SNF Issues urinary retention started after hospitalization and no surgery Therapy evaluations as able  Paroxysmal atrial fibrillation DC heparin gtt. Restart home Eliquis  Seizure disorder Per patient no seizures since late 80s Continue home Tegretol  Essential hypertension Home antihypertensives on hold in setting of sepsis   DVT prophylaxis: Eliquis Code Status: Full Family Communication: Spouse and son at bedside 12/14 Disposition Plan: Status is: Inpatient  Remains inpatient appropriate because: Severe sepsis, E. coli bacteremia in the setting of urinary retention, complicated UTI       Level of care: Med-Surg  Consultants:  None  Procedures:  None  Antimicrobials: Rocephin   Subjective: Seen and examined.  Resting in bed.  No visible distress.  Does endorse pain in left hip and left leg.  Son and wife at bedside  Objective: Vitals:   09/07/21 0440 09/07/21 0530 09/07/21 0600 09/07/21 0817  BP: (!) 146/102 (!) 144/83 (!) 154/85 (!) 160/80  Pulse: 75 84 78 82  Resp: 20 (!)  22 (!) 25 20  Temp:    (!) 97.5 F (36.4 C)  TempSrc:    Oral  SpO2: 94% 92% 94% 94%  Weight:      Height:        Intake/Output Summary (Last 24 hours) at 09/07/2021 1249 Last data filed at 09/07/2021 0540 Gross per 24 hour  Intake 3473.44 ml  Output 925 ml  Net 2548.44 ml   Filed Weights    09/05/21 1518  Weight: 97.5 kg    Examination:  General exam: No acute distress Respiratory system: Scattered bibasilar crackles.  Normal work of breathing.  2 L Cardiovascular system: S1-S2, RRR, no murmurs, no pedal edema Gastrointestinal system: Soft, NT/ND, normal bowel sounds Central nervous system: Alert and oriented. No focal neurological deficits. Extremities: LLE in surgical cast Skin: No rashes, lesions or ulcers Psychiatry: Judgement and insight appear normal. Mood & affect appropriate.     Data Reviewed: I have personally reviewed following labs and imaging studies  CBC: Recent Labs  Lab 09/05/21 1521 09/06/21 0225 09/06/21 0707 09/06/21 1401 09/07/21 0701  WBC 26.0* 26.4* 26.9* 19.7* 19.1*  NEUTROABS 23.6*  --   --   --   --   HGB 10.6* 8.7* 8.0* 8.9* 8.9*  HCT 35.7* 28.8* 26.2* 28.0* 28.9*  MCV 77.3* 76.0* 76.2* 77.6* 78.1*  PLT 268 176 168 152 761   Basic Metabolic Panel: Recent Labs  Lab 09/05/21 1521 09/05/21 2344 09/06/21 0707 09/06/21 1401 09/07/21 0701  NA 134* 132* 131* 132*  --   K 3.7 3.3* 3.9 3.7  --   CL 96* 100 100 103  --   CO2 _0 --   GLUCOSE 137* 115* 152* 103*  --   BUN 23 27* 29* 29*  --   CREATININE 1.31* 1.23 1.26* 1.14 1.14  CALCIUM 9.3 8.4* 7.8* 7.8*  --   MG  --  1.6* 2.3  --   --   PHOS  --  1.5* 5.0*  --   --    GFR: Estimated Creatinine Clearance: 61.5 mL/min (by C-G formula based on SCr of 1.14 mg/dL). Liver Function Tests: Recent Labs  Lab 09/05/21 1521 09/05/21 2344 09/06/21 0707  AST 30 30 33  ALT _1 ALKPHOS 140* 120 99  BILITOT 1.1 1.3* 1.1  PROT 7.3 5.6* 5.2*  ALBUMIN 3.4* 2.7* 2.4*   Recent Labs  Lab 09/05/21 1536  LIPASE 25   No results for input(s): AMMONIA in the last 168 hours. Coagulation Profile: Recent Labs  Lab 09/05/21 1521 09/06/21 0225  INR 1.7* 2.2*   Cardiac Enzymes: No results for input(s): CKTOTAL, CKMB, CKMBINDEX, TROPONINI in the last 168 hours. BNP (last  3 results) No results for input(s): PROBNP in the last 8760 hours. HbA1C: Recent Labs    09/05/21 2121  HGBA1C 5.8*   CBG: Recent Labs  Lab 09/06/21 0230  GLUCAP 118*   Lipid Profile: No results for input(s): CHOL, HDL, LDLCALC, TRIG, CHOLHDL, LDLDIRECT in the last 72 hours. Thyroid Function Tests: Recent Labs    09/05/21 2121  TSH 1.536  FREET4 0.87   Anemia Panel: Recent Labs    09/05/21 2120 09/05/21 2121  VITAMINB12  --  655  FOLATE  --  8.8  FERRITIN  --  72  TIBC  --  263  IRON  --  17*  RETICCTPCT 1.1  --    Sepsis Labs: Recent Labs  Lab 09/05/21 1528 09/05/21 1536 09/05/21 2121  09/05/21 2344 09/06/21 0707 09/06/21 1401  PROCALCITON  --  26.01  --   --  42.04  --   LATICACIDVEN 3.1*  --  6.4* 3.5*  --  1.4    Recent Results (from the past 240 hour(s))  Culture, blood (Routine x 2)     Status: None (Preliminary result)   Collection Time: 09/05/21  3:20 PM   Specimen: BLOOD  Result Value Ref Range Status   Specimen Description   Final    BLOOD LEFT ANTECUBITAL Performed at [facility], [address], [place], [place] [address]    Special Requests   Final    BOTTLES DRAWN AEROBIC AND ANAEROBIC Blood Culture adequate volume Performed at [facility], [address] [place], [place] [address]    Culture  Setup Time   Final    Organism ID to follow [place] TO, READ BACK BY AND VERIFIED WITH: Virl Cagey _0  on 09/06/21 skl    Culture GRAM NEGATIVE RODS  Final   Report Status PENDING  Incomplete  Culture, blood (Routine x 2)     Status: None (Preliminary result)   Collection Time: 09/05/21  3:20 PM   Specimen: BLOOD  Result Value Ref Range Status   Specimen Description BLOOD BLOOD LEFT ARM  Final   Special Requests   Final    BOTTLES DRAWN AEROBIC AND ANAEROBIC Blood Culture adequate volume   Culture   Final    NO GROWTH 2 DAYS Performed at [facility], [address] [place], [place] [address]    Report Status PENDING  Incomplete  Blood Culture ID Panel (Reflexed)     Status: Abnormal   Collection Time: 09/05/21  3:20 PM  Result Value Ref Range Status   Enterococcus faecalis NOT DETECTED NOT DETECTED Final   Enterococcus Faecium NOT DETECTED NOT DETECTED Final   Listeria monocytogenes NOT DETECTED NOT DETECTED Final   Staphylococcus species NOT DETECTED NOT DETECTED Final   Staphylococcus aureus (BCID) NOT DETECTED NOT DETECTED Final   Staphylococcus epidermidis NOT DETECTED NOT DETECTED Final   Staphylococcus lugdunensis NOT DETECTED NOT DETECTED Final   Streptococcus species NOT DETECTED NOT DETECTED Final   Streptococcus agalactiae NOT DETECTED NOT DETECTED Final   Streptococcus pneumoniae NOT DETECTED NOT DETECTED Final   Streptococcus pyogenes NOT DETECTED NOT DETECTED Final   A.calcoaceticus-baumannii NOT DETECTED NOT DETECTED Final   Bacteroides fragilis NOT DETECTED NOT DETECTED Final   Enterobacterales DETECTED (A) NOT DETECTED Final    Comment: Enterobacterales represent a large order of gram negative bacteria, not a single organism. CRITICAL RESULT CALLED TO, READ BACK BY AND VERIFIED WITH: Virl Cagey _1  on 09/06/21 skl    Enterobacter cloacae complex NOT DETECTED NOT DETECTED Final   Escherichia coli DETECTED (A) NOT DETECTED Final    Comment: CRITICAL RESULT CALLED TO, READ BACK BY AND VERIFIED WITH: Virl Cagey _2  on 09/06/21 skl    Klebsiella aerogenes NOT DETECTED NOT DETECTED Final   Klebsiella oxytoca NOT DETECTED NOT DETECTED Final   Klebsiella pneumoniae NOT DETECTED NOT DETECTED Final   Proteus species NOT DETECTED NOT DETECTED Final   Salmonella species NOT DETECTED NOT DETECTED Final   Serratia marcescens NOT DETECTED NOT DETECTED Final   Haemophilus influenzae NOT DETECTED NOT DETECTED Final   Neisseria meningitidis NOT DETECTED NOT DETECTED Final   Pseudomonas aeruginosa NOT DETECTED  NOT DETECTED Final   Stenotrophomonas maltophilia NOT DETECTED NOT DETECTED Final  Candida albicans NOT DETECTED NOT DETECTED Final   Candida auris NOT DETECTED NOT DETECTED Final   Candida glabrata NOT DETECTED NOT DETECTED Final   Candida krusei NOT DETECTED NOT DETECTED Final   Candida parapsilosis NOT DETECTED NOT DETECTED Final   Candida tropicalis NOT DETECTED NOT DETECTED Final   Cryptococcus neoformans/gattii NOT DETECTED NOT DETECTED Final   CTX-M ESBL NOT DETECTED NOT DETECTED Final   Carbapenem resistance IMP NOT DETECTED NOT DETECTED Final   Carbapenem resistance KPC NOT DETECTED NOT DETECTED Final   Carbapenem resistance NDM NOT DETECTED NOT DETECTED Final   Carbapenem resist OXA 48 LIKE NOT DETECTED NOT DETECTED Final   Carbapenem resistance VIM NOT DETECTED NOT DETECTED Final    Comment: Performed at [facility], [address] [place], [place] [address]  Resp Panel by RT-PCR (Flu A&B, Covid) Nasopharyngeal Swab     Status: None   Collection Time: 09/05/21  3:21 PM   Specimen: Nasopharyngeal Swab; Nasopharyngeal(NP) swabs in vial transport medium  Result Value Ref Range Status   SARS Coronavirus 2 by RT PCR NEGATIVE NEGATIVE Final    Comment: (NOTE) SARS-CoV-2 target nucleic acids are NOT DETECTED.  The SARS-CoV-2 RNA is generally detectable in upper respiratory specimens during the acute phase of infection. The lowest concentration of SARS-CoV-2 viral copies this assay can detect is 138 copies/mL. A negative result does not preclude SARS-Cov-2 infection and should not be used as the sole basis for treatment or other patient management decisions. A negative result may occur with  improper specimen collection/handling, submission of specimen other than nasopharyngeal swab, presence of viral mutation(s) within the areas targeted by this assay, and inadequate number of viral copies(<138 copies/mL). A negative result must be combined with clinical  observations, patient history, and epidemiological information. The expected result is Negative.  Fact Sheet for Patients:  EntrepreneurPulse.com.au  Fact Sheet for Healthcare Providers:  IncredibleEmployment.be  This test is no t yet approved or cleared by the [place] FDA and  has been authorized for detection and/or diagnosis of SARS-CoV-2 by FDA under an Emergency Use Authorization (EUA). This EUA will remain  in effect (meaning this test can be used) for the duration of the COVID-19 declaration under Section 564(b)(1) of the Act, 21 U.S.C.section 360bbb-3(b)(1), unless the authorization is terminated  or revoked sooner.       Influenza A by PCR NEGATIVE NEGATIVE Final   Influenza B by PCR NEGATIVE NEGATIVE Final    Comment: (NOTE) The Xpert Xpress SARS-CoV-2/FLU/RSV plus assay is intended as an aid in the diagnosis of influenza from Nasopharyngeal swab specimens and should not be used as a sole basis for treatment. Nasal washings and aspirates are unacceptable for Xpert Xpress SARS-CoV-2/FLU/RSV testing.  Fact Sheet for Patients: EntrepreneurPulse.com.au  Fact Sheet for Healthcare Providers: IncredibleEmployment.be  This test is not yet approved or cleared by the [place] FDA and has been authorized for detection and/or diagnosis of SARS-CoV-2 by FDA under an Emergency Use Authorization (EUA). This EUA will remain in effect (meaning this test can be used) for the duration of the COVID-19 declaration under Section 564(b)(1) of the Act, 21 U.S.C. section 360bbb-3(b)(1), unless the authorization is terminated or revoked.  Performed at [facility], [address] [place], [place] [address]   Urine Culture     Status: None   Collection Time: 09/06/21  2:25 AM   Specimen: Urine, Random  Result Value Ref Range Status   Specimen Description   Final  URINE, RANDOM Performed at  [facility], [address] [place], [place] [address]    Special Requests   Final    NONE Performed at [facility], [address], [place], [place] [address]    Culture   Final    NO GROWTH Performed at [facility], [place] [place], [place] [address]    Report Status 09/07/2021 FINAL  Final         Radiology Studies: DG Chest 1 View  Result Date: 09/06/2021 CLINICAL DATA:  Acute respiratory failure. EXAM: CHEST  1 VIEW COMPARISON:  Chest radiograph dated 09/05/2021. FINDINGS: There is cardiomegaly with mild central vascular congestion. No focal consolidation, pleural effusion or pneumothorax. No acute osseous pathology. IMPRESSION: Cardiomegaly with mild central vascular congestion. Electronically Signed   By: Anner Crete M.D.   On: 09/06/2021 02:21   CT HEAD WO CONTRAST (5MM)  Result Date: 09/05/2021 CLINICAL DATA:  Mental status change, unknown cause EXAM: CT HEAD WITHOUT CONTRAST TECHNIQUE: Contiguous axial images were obtained from the base of the skull through the vertex without intravenous contrast. COMPARISON:  08/16/2021 FINDINGS: Brain: There is atrophy and chronic small vessel disease changes. No acute intracranial abnormality. Specifically, no hemorrhage, hydrocephalus, mass lesion, acute infarction, or significant intracranial injury. Vascular: No hyperdense vessel or unexpected calcification. Skull: No acute calvarial abnormality. Sinuses/Orbits: No acute findings Other: None IMPRESSION: Atrophy, chronic microvascular disease. No acute intracranial abnormality. Electronically Signed   By: Rolm Baptise M.D.   On: 09/05/2021 23:29   CT Abdomen Pelvis W Contrast  Result Date: 09/05/2021 CLINICAL DATA:  Sepsis, recent urinary catheter removal, nausea/vomiting/diarrhea, febrile EXAM: CT ABDOMEN AND PELVIS WITH CONTRAST TECHNIQUE: Multidetector CT imaging of the abdomen and pelvis was performed using the standard protocol following  bolus administration of intravenous contrast. CONTRAST:  13m OMNIPAQUE IOHEXOL 300 MG/ML  SOLN COMPARISON:  08/16/2021 FINDINGS: Lower chest: Stable cardiomegaly. No acute pleural or parenchymal lung disease. Hepatobiliary: No focal liver abnormality is seen. No gallstones, gallbladder wall thickening, or biliary dilatation. Pancreas: Unremarkable. No pancreatic ductal dilatation or surrounding inflammatory changes. Spleen: Normal in size without focal abnormality. Adrenals/Urinary Tract: Stable right adrenal myelolipoma. Mild nodular thickening of the left adrenal gland is unchanged. There is subtle heterogeneous decreased enhancement within the ventral aspect of the upper pole right kidney, concerning for focal pyelonephritis. No evidence of renal abscess. Left kidney enhances normally. Stable punctate bilateral nonobstructing renal calculi. No obstructive uropathy within either kidney. The bladder is decompressed, with nonspecific bladder wall thickening. Mild perivesicular fat stranding suggests cystitis. Stomach/Bowel: No bowel obstruction or ileus. No bowel wall thickening or inflammatory change. Vascular/Lymphatic: Aortic atherosclerosis. No enlarged abdominal or pelvic lymph nodes. Reproductive: Prostate is unremarkable. Other: No free fluid or free gas.  No abdominal wall hernia. Musculoskeletal: No acute or destructive bony lesions. Stable postsurgical changes lower lumbar spine. Reconstructed images demonstrate no additional findings. IMPRESSION: 1. Bladder wall thickening and perivesicular fat stranding compatible with cystitis. 2. Subtle heterogeneous decreased enhancement ventral aspect upper pole right kidney, concerning for focal pyelonephritis. No renal abscess. 3. Stable bilateral nonobstructing renal calculi. 4.  Aortic Atherosclerosis (ICD10-I70.0). Electronically Signed   By: MRanda NgoM.D.   On: 09/05/2021 18:01   DG Chest Portable 1 View  Result Date: 09/05/2021 CLINICAL DATA:   Sepsis, onset of nausea, vomiting, diarrhea and urinary pain 2 hours following urinary catheter removal on Friday, fever to 103 degrees EXAM: PORTABLE CHEST 1 VIEW COMPARISON:  Portable exam 1631 hours  compared to 08/16/2021 FINDINGS: Rotated to the LEFT. Borderline enlargement of cardiac silhouette. Mediastinal contours and pulmonary vascularity normal. Lungs clear. No pulmonary infiltrate, pleural effusion, or pneumothorax. No acute osseous findings. IMPRESSION: No acute abnormalities. Electronically Signed   By: Lavonia Dana M.D.   On: 09/05/2021 16:50   DG HIP UNILAT WITH PELVIS 2-3 VIEWS LEFT  Result Date: 09/07/2021 CLINICAL DATA:  Left hip pain.  No known injury. EXAM: DG HIP (WITH OR WITHOUT PELVIS) 2-3V LEFT COMPARISON:  None. FINDINGS: No evidence of joint space narrowing. No fracture or focal lesion. Other bones of the pelvis appear normal. Prior lumbar fusion. IMPRESSION: Negative pelvis and left hip. No evidence of degenerative or traumatic finding. Electronically Signed   By: Nelson Chimes M.D.   On: 09/07/2021 11:38        Scheduled Meds:  carbamazepine  200 mg Oral BID   gabapentin  300 mg Oral BID   metoprolol tartrate  5 mg Intravenous Once   pantoprazole (PROTONIX) IV  40 mg Intravenous Q12H   tamsulosin  0.4 mg Oral QPC supper   Continuous Infusions:  sodium chloride 75 mL/hr at 09/07/21 1049   cefTRIAXone (ROCEPHIN)  IV       LOS: 2 days    Time spent: 35 minutes    Sidney Ace, MD [facility]   If 7PM-7AM, please contact night-coverage  09/07/2021, 12:50 PM

## 2021-09-07 NOTE — ED Notes (Signed)
Removed tegaderm on patient's IV in the right hand due to it oozing blood. Cleansed skin, patted dry and applied new tegaderm. After restarting infusions, the IV begins to ooze blood again. Removed tegaderm and placed a sterile gauze under a new tegaderm. Moved infusions to the IV in the left hand, noted to be infusing without difficulty. Will recheck right hand IV in a little while.

## 2021-09-07 NOTE — Consult Note (Signed)
ANTICOAGULATION CONSULT NOTE  Pharmacy Consult for Apixaban Indication: afib  Patient Measurements: Height: 6' (182.9 cm) Weight: 97.5 kg (215 lb) IBW/kg (Calculated) : 77.6  Labs: Recent Labs    09/05/21 1521 09/05/21 1536 09/06/21 0225 09/06/21 0707 09/06/21 1401 09/06/21 1837 09/07/21 0701  HGB 10.6*  --  8.7* 8.0* 8.9*  --  8.9*  HCT 35.7*  --  28.8* 26.2* 28.0*  --  28.9*  PLT 268  --  176 168 152  --  150  APTT  --   --  51*  --   --  >200* 79*  LABPROT 20.4*  --  24.3*  --   --   --   --   INR 1.7*  --  2.2*  --   --   --   --   HEPARINUNFRC  --   --  >1.10*  --   --   --  >1.10*  CREATININE 1.31*   < >  --  1.26* 1.14  --  1.14  TROPONINIHS  --    < > 624* 417* 233*  --   --    < > = values in this interval not displayed.     Estimated Creatinine Clearance: 61.5 mL/min (by C-G formula based on SCr of 1.14 mg/dL).   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 Jerl Mina, [facility] in [place]   Intracranial bleed [facility])    december 2010   Left leg DVT (HCC) 09/2019   Migraines    "2 in my life" (09/17/2012)   Mitral regurgitation    a. 01/2019 Echo: EF 55-60%, mild conc LVH. RVSP 59.23mmHg. Mildly dil LA. Mild to mod MR. Mild AI.   PAH (pulmonary artery hypertension) (HCC)    Permanent atrial fibrillation (HCC)    a. CHA2DS2VASc = 5-->No OAC 2/2 h/o SAH; b. 03/2019 [facility]: permanent Afib - 78 bpm (46-135). Occas PVCs (1.4% burden).   Retina disorder     hx of torn retina   Stroke (HCC) 2002   LLE "just a little bit weaker" (09/17/2012)   Subarachnoid hemorrhage (HCC) 08/2009    Medications:  Apixaban 5 mg BID prior to admission  Assessment: Patient is an 80 y/o M with medical history as above and including Afib and history of VTE, CVA, SAH who is admitted with urosepsis in setting of recent urinary instrumentation (catheter removal). Patient is on apixaban prior to admission for Afib / history of VTE. Pt was previously on heparin drip. Pharmacy consulted for apixaban dosing.     Plan:  --Stop heparin infusion and start apixaban 5 mg BID --CBC per protocol; monitor closely for bleeding  Raiford Noble, PharmD Clinical Pharmacist   09/07/2021,12:58 PM

## 2021-09-07 NOTE — Plan of Care (Signed)
  Problem: Education: Goal: Knowledge of General Education information will improve Description: Including pain rating scale, medication(s)/side effects and non-pharmacologic comfort measures Outcome: Progressing   Problem: Health Behavior/Discharge Planning: Goal: Ability to manage health-related needs will improve Outcome: Progressing   Problem: Clinical Measurements: Goal: Ability to maintain clinical measurements within normal limits will improve Outcome: Progressing   Problem: Clinical Measurements: Goal: Will remain free from infection Outcome: Progressing   Problem: Clinical Measurements: Goal: Diagnostic test results will improve Outcome: Progressing   Problem: Clinical Measurements: Goal: Respiratory complications will improve Outcome: Progressing   Problem: Clinical Measurements: Goal: Cardiovascular complication will be avoided Outcome: Progressing   Problem: Activity: Goal: Risk for activity intolerance will decrease Outcome: Progressing   Problem: Nutrition: Goal: Adequate nutrition will be maintained Outcome: Progressing   Problem: Elimination: Goal: Will not experience complications related to bowel motility Outcome: Progressing   Problem: Elimination: Goal: Will not experience complications related to urinary retention Outcome: Progressing   Problem: Pain Managment: Goal: General experience of comfort will improve Outcome: Progressing   Problem: Safety: Goal: Ability to remain free from injury will improve Outcome: Progressing   Problem: Skin Integrity: Goal: Risk for impaired skin integrity will decrease Outcome: Progressing   

## 2021-09-08 LAB — CBC
HCT: 28.9 % — ABNORMAL LOW (ref 39.0–52.0)
Hemoglobin: 8.9 g/dL — ABNORMAL LOW (ref 13.0–17.0)
MCH: 24.1 pg — ABNORMAL LOW (ref 26.0–34.0)
MCHC: 30.8 g/dL (ref 30.0–36.0)
MCV: 78.3 fL — ABNORMAL LOW (ref 80.0–100.0)
Platelets: 158 10*3/uL (ref 150–400)
RBC: 3.69 MIL/uL — ABNORMAL LOW (ref 4.22–5.81)
RDW: 22.5 % — ABNORMAL HIGH (ref 11.5–15.5)
WBC: 13.4 10*3/uL — ABNORMAL HIGH (ref 4.0–10.5)
nRBC: 0 % (ref 0.0–0.2)

## 2021-09-08 MED ORDER — ALBUTEROL SULFATE (2.5 MG/3ML) 0.083% IN NEBU
3.0000 mL | INHALATION_SOLUTION | RESPIRATORY_TRACT | Status: DC | PRN
  Administered 2021-09-08 – 2021-09-09 (×2): 3 mL via RESPIRATORY_TRACT
  Filled 2021-09-08 (×2): qty 3

## 2021-09-08 MED ORDER — GABAPENTIN 300 MG PO CAPS
300.0000 mg | ORAL_CAPSULE | Freq: Two times a day (BID) | ORAL | Status: DC
  Administered 2021-09-08: 300 mg via ORAL
  Filled 2021-09-08: qty 1

## 2021-09-08 MED ORDER — GABAPENTIN 300 MG PO CAPS
300.0000 mg | ORAL_CAPSULE | Freq: Three times a day (TID) | ORAL | Status: DC
  Administered 2021-09-08 (×2): 300 mg via ORAL
  Filled 2021-09-08 (×3): qty 1

## 2021-09-08 NOTE — Care Management Important Message (Signed)
Important Message  Patient Details  Name: [name] MRN: [id number] Date of Birth: 05/01/1941   Medicare Important Message Given:  Yes     Johnell Comings 09/08/2021, 1:55 PM

## 2021-09-08 NOTE — Progress Notes (Signed)
PROGRESS NOTE    [name]  YQI:[id number] DOB: June 12, 1941 DOA: 09/05/2021 PCP: [name], MD    Brief Narrative:  80 y.o. male seen in ed with complaints of  Dysuria fever and chills since Friday evening.Pt is not able to start a stream.  Pt also reports diarrhea. Incomplete emptying.  Patient has dementia and is able to give [place] history but briefly.  And was seen about a week ago for acute urinary retention and had a Foley placed and was discharged with urology follow-up which he has yet to do as the appointment is upcoming.  Currently patient denies any headaches blurred vision chest pain shortness of breath nausea vomiting.  Patient states that he has had an intracranial hemorrhage and had to have neurosurgery at [facility].  He is on Eliquis currently and has been cleared through his cardiologist.  The following morning patient states that he has some pain in the hip and left leg status post fracture.  No specific complaints of abdominal pain on my evaluation however per son at bedside patient had excruciating abdominal pain prior to presentation.  Blood cultures now positive for E. coli   Assessment & Plan:   Principal Problem:   Dysuria Active Problems:   Atrial fibrillation (HCC)   Hypertension   Seizures (HCC)   Stroke (HCC)   DVT (deep venous thrombosis) (HCC)   History of cerebral hemorrhage   Occult GI bleeding   Severe sepsis (HCC)   Anemia   AKI (acute kidney injury) ([place])   Elevated glucose   Acute pyelonephritis   Complicated UTI (urinary tract infection)  Complicated urinary tract infection E. coli bacteremia Urinary retention Severe sepsis secondary to above Retention followed hospitalization and subsequent rehab stay status post left leg fracture Severe sepsis met by heart rate, temperature, elevated lactic acid CT abdomen with cystitis and possible pyelonephritis No stone or abscess Hemodynamics reassuring Plan: Continue Rocephin 2 g  daily Discontinue IV fluids Continue Foley catheter Continue daily Flomax  Demand ischemia/elevated troponins ACS ruled out No changes on EKG, no chest pain Troponin peaked at 624, likely secondary to sepsis TTE: EF 55-60 with no WMA Plan: DC telemetry Outpatient cardiology follow-up  Acute on chronic anemia No indication for transfusion currently  Left hip pain Acute on chronic, suspect referred pain from left ankle fracture Plain film x-ray negative for fracture Escalate pain regimen to treat control  Left ankle fracture Status post operative repair.  Now in cast.  Coming from SNF Issues urinary retention started after hospitalization and no surgery Therapy evaluations as able  Paroxysmal atrial fibrillation Continue home Eliquis  Seizure disorder Per patient no seizures since late 80s Continue home Tegretol  Essential hypertension Home antihypertensives on hold in setting of sepsis   DVT prophylaxis: Eliquis Code Status: Full Family Communication: Spouse and son at bedside 12/14, 12/15 Disposition Plan: Status is: Inpatient  Remains inpatient appropriate because: Severe sepsis, E. coli bacteremia in the setting of urinary retention, complicated UTI.  Anticipate medical readiness for discharge in 24 hours       Level of care: Med-Surg  Consultants:  None  Procedures:  None  Antimicrobials: Rocephin   Subjective: patient seen and examined.  Pain control improved.  Son at bedside.  Objective: Vitals:   09/07/21 1609 09/07/21 2018 09/08/21 0358 09/08/21 0811  BP: (!) 151/79 (!) 150/87 (!) 162/92 (!) 143/95  Pulse: 76 96 (!) 101 88  Resp: _0 Temp: 98.7 F (37.1 C) 98.4  F (36.9 C) 98.3 F (36.8 C) 98.5 F (36.9 C)  TempSrc: Oral  Oral Oral  SpO2: 98% 95% 93% 96%  Weight:      Height:        Intake/Output Summary (Last 24 hours) at 09/08/2021 1152 Last data filed at 09/08/2021 1029 Gross per 24 hour  Intake 2535.13 ml  Output  1150 ml  Net 1385.13 ml   Filed Weights   09/05/21 1518  Weight: 97.5 kg    Examination:  General exam: No acute distress Respiratory system: Lungs clear.  Normal work of breathing.  Room air Cardiovascular system: S1-S2, RRR, no murmurs, no pedal edema Gastrointestinal system: Soft, NT/ND, normal bowel sounds Central nervous system: Alert and oriented. No focal neurological deficits. Extremities: LLE in surgical cast Skin: No rashes, lesions or ulcers Psychiatry: Judgement and insight appear normal. Mood & affect appropriate.     Data Reviewed: I have personally reviewed following labs and imaging studies  CBC: Recent Labs  Lab 09/05/21 1521 09/06/21 0225 09/06/21 0707 09/06/21 1401 09/07/21 0701 09/08/21 0249  WBC 26.0* 26.4* 26.9* 19.7* 19.1* 13.4*  NEUTROABS 23.6*  --   --   --   --   --   HGB 10.6* 8.7* 8.0* 8.9* 8.9* 8.9*  HCT 35.7* 28.8* 26.2* 28.0* 28.9* 28.9*  MCV 77.3* 76.0* 76.2* 77.6* 78.1* 78.3*  PLT 268 176 168 152 150 536   Basic Metabolic Panel: Recent Labs  Lab 09/05/21 1521 09/05/21 2344 09/06/21 0707 09/06/21 1401 09/07/21 0701  NA 134* 132* 131* 132*  --   K 3.7 3.3* 3.9 3.7  --   CL 96* 100 100 103  --   CO2 _0 --   GLUCOSE 137* 115* 152* 103*  --   BUN 23 27* 29* 29*  --   CREATININE 1.31* 1.23 1.26* 1.14 1.14  CALCIUM 9.3 8.4* 7.8* 7.8*  --   MG  --  1.6* 2.3  --   --   PHOS  --  1.5* 5.0*  --   --    GFR: Estimated Creatinine Clearance: 61.5 mL/min (by C-G formula based on SCr of 1.14 mg/dL). Liver Function Tests: Recent Labs  Lab 09/05/21 1521 09/05/21 2344 09/06/21 0707  AST 30 30 33  ALT _1 ALKPHOS 140* 120 99  BILITOT 1.1 1.3* 1.1  PROT 7.3 5.6* 5.2*  ALBUMIN 3.4* 2.7* 2.4*   Recent Labs  Lab 09/05/21 1536  LIPASE 25   No results for input(s): AMMONIA in the last 168 hours. Coagulation Profile: Recent Labs  Lab 09/05/21 1521 09/06/21 0225  INR 1.7* 2.2*   Cardiac Enzymes: No results for  input(s): CKTOTAL, CKMB, CKMBINDEX, TROPONINI in the last 168 hours. BNP (last 3 results) No results for input(s): PROBNP in the last 8760 hours. HbA1C: Recent Labs    09/05/21 2121  HGBA1C 5.8*   CBG: Recent Labs  Lab 09/06/21 0230  GLUCAP 118*   Lipid Profile: No results for input(s): CHOL, HDL, LDLCALC, TRIG, CHOLHDL, LDLDIRECT in the last 72 hours. Thyroid Function Tests: Recent Labs    09/05/21 2121  TSH 1.536  FREET4 0.87   Anemia Panel: Recent Labs    09/05/21 2120 09/05/21 2121  VITAMINB12  --  655  FOLATE  --  8.8  FERRITIN  --  72  TIBC  --  263  IRON  --  17*  RETICCTPCT 1.1  --    Sepsis Labs: Recent Labs  Lab  09/05/21 1528 09/05/21 1536 09/05/21 2121 09/05/21 2344 09/06/21 0707 09/06/21 1401  PROCALCITON  --  26.01  --   --  42.04  --   LATICACIDVEN 3.1*  --  6.4* 3.5*  --  1.4    Recent Results (from the past 240 hour(s))  Culture, blood (Routine x 2)     Status: Abnormal (Preliminary result)   Collection Time: 09/05/21  3:20 PM   Specimen: BLOOD  Result Value Ref Range Status   Specimen Description   Final    BLOOD LEFT ANTECUBITAL Performed at [facility], [address], [place], [place] [address]    Special Requests   Final    BOTTLES DRAWN AEROBIC AND ANAEROBIC Blood Culture adequate volume Performed at [facility], [address] [place], [place] [address]    Culture  Setup Time   Final    GRAM NEGATIVE RODS ANAEROBIC BOTTLE ONLY CRITICAL RESULT CALLED TO, READ BACK BY AND VERIFIED WITH: Virl Cagey _0  on 09/06/21 skl    Culture (A)  Final    ESCHERICHIA COLI SUSCEPTIBILITIES TO FOLLOW Performed at [facility], [address], [place], [place] [address]    Report Status PENDING  Incomplete  Culture, blood (Routine x 2)     Status: None (Preliminary result)   Collection Time: 09/05/21  3:20 PM   Specimen: BLOOD  Result Value Ref Range Status   Specimen Description BLOOD BLOOD LEFT ARM   Final   Special Requests   Final    BOTTLES DRAWN AEROBIC AND ANAEROBIC Blood Culture adequate volume   Culture   Final    NO GROWTH 3 DAYS Performed at [facility], [address] [place], [place] [address]    Report Status PENDING  Incomplete  Blood Culture ID Panel (Reflexed)     Status: Abnormal   Collection Time: 09/05/21  3:20 PM  Result Value Ref Range Status   Enterococcus faecalis NOT DETECTED NOT DETECTED Final   Enterococcus Faecium NOT DETECTED NOT DETECTED Final   Listeria monocytogenes NOT DETECTED NOT DETECTED Final   Staphylococcus species NOT DETECTED NOT DETECTED Final   Staphylococcus aureus (BCID) NOT DETECTED NOT DETECTED Final   Staphylococcus epidermidis NOT DETECTED NOT DETECTED Final   Staphylococcus lugdunensis NOT DETECTED NOT DETECTED Final   Streptococcus species NOT DETECTED NOT DETECTED Final   Streptococcus agalactiae NOT DETECTED NOT DETECTED Final   Streptococcus pneumoniae NOT DETECTED NOT DETECTED Final   Streptococcus pyogenes NOT DETECTED NOT DETECTED Final   A.calcoaceticus-baumannii NOT DETECTED NOT DETECTED Final   Bacteroides fragilis NOT DETECTED NOT DETECTED Final   Enterobacterales DETECTED (A) NOT DETECTED Final    Comment: Enterobacterales represent a large order of gram negative bacteria, not a single organism. CRITICAL RESULT CALLED TO, READ BACK BY AND VERIFIED WITH: Virl Cagey _1  on 09/06/21 skl    Enterobacter cloacae complex NOT DETECTED NOT DETECTED Final   Escherichia coli DETECTED (A) NOT DETECTED Final    Comment: CRITICAL RESULT CALLED TO, READ BACK BY AND VERIFIED WITH: Virl Cagey _2  on 09/06/21 skl    Klebsiella aerogenes NOT DETECTED NOT DETECTED Final   Klebsiella oxytoca NOT DETECTED NOT DETECTED Final   Klebsiella pneumoniae NOT DETECTED NOT DETECTED Final   Proteus species NOT DETECTED NOT DETECTED Final   Salmonella species NOT DETECTED NOT DETECTED Final   Serratia marcescens NOT DETECTED  NOT DETECTED Final   Haemophilus influenzae NOT DETECTED NOT DETECTED Final   Neisseria meningitidis NOT DETECTED  NOT DETECTED Final   Pseudomonas aeruginosa NOT DETECTED NOT DETECTED Final   Stenotrophomonas maltophilia NOT DETECTED NOT DETECTED Final   Candida albicans NOT DETECTED NOT DETECTED Final   Candida auris NOT DETECTED NOT DETECTED Final   Candida glabrata NOT DETECTED NOT DETECTED Final   Candida krusei NOT DETECTED NOT DETECTED Final   Candida parapsilosis NOT DETECTED NOT DETECTED Final   Candida tropicalis NOT DETECTED NOT DETECTED Final   Cryptococcus neoformans/gattii NOT DETECTED NOT DETECTED Final   CTX-M ESBL NOT DETECTED NOT DETECTED Final   Carbapenem resistance IMP NOT DETECTED NOT DETECTED Final   Carbapenem resistance KPC NOT DETECTED NOT DETECTED Final   Carbapenem resistance NDM NOT DETECTED NOT DETECTED Final   Carbapenem resist OXA 48 LIKE NOT DETECTED NOT DETECTED Final   Carbapenem resistance VIM NOT DETECTED NOT DETECTED Final    Comment: Performed at [facility], [address] [place], [place] [address]  Resp Panel by RT-PCR (Flu A&B, Covid) Nasopharyngeal Swab     Status: None   Collection Time: 09/05/21  3:21 PM   Specimen: Nasopharyngeal Swab; Nasopharyngeal(NP) swabs in vial transport medium  Result Value Ref Range Status   SARS Coronavirus 2 by RT PCR NEGATIVE NEGATIVE Final    Comment: (NOTE) SARS-CoV-2 target nucleic acids are NOT DETECTED.  The SARS-CoV-2 RNA is generally detectable in upper respiratory specimens during the acute phase of infection. The lowest concentration of SARS-CoV-2 viral copies this assay can detect is 138 copies/mL. A negative result does not preclude SARS-Cov-2 infection and should not be used as the sole basis for treatment or other patient management decisions. A negative result may occur with  improper specimen collection/handling, submission of specimen other than nasopharyngeal swab, presence of  viral mutation(s) within the areas targeted by this assay, and inadequate number of viral copies(<138 copies/mL). A negative result must be combined with clinical observations, patient history, and epidemiological information. The expected result is Negative.  Fact Sheet for Patients:  EntrepreneurPulse.com.au  Fact Sheet for Healthcare Providers:  IncredibleEmployment.be  This test is no t yet approved or cleared by the [place] FDA and  has been authorized for detection and/or diagnosis of SARS-CoV-2 by FDA under an Emergency Use Authorization (EUA). This EUA will remain  in effect (meaning this test can be used) for the duration of the COVID-19 declaration under Section 564(b)(1) of the Act, 21 U.S.C.section 360bbb-3(b)(1), unless the authorization is terminated  or revoked sooner.       Influenza A by PCR NEGATIVE NEGATIVE Final   Influenza B by PCR NEGATIVE NEGATIVE Final    Comment: (NOTE) The Xpert Xpress SARS-CoV-2/FLU/RSV plus assay is intended as an aid in the diagnosis of influenza from Nasopharyngeal swab specimens and should not be used as a sole basis for treatment. Nasal washings and aspirates are unacceptable for Xpert Xpress SARS-CoV-2/FLU/RSV testing.  Fact Sheet for Patients: EntrepreneurPulse.com.au  Fact Sheet for Healthcare Providers: IncredibleEmployment.be  This test is not yet approved or cleared by the [place] FDA and has been authorized for detection and/or diagnosis of SARS-CoV-2 by FDA under an Emergency Use Authorization (EUA). This EUA will remain in effect (meaning this test can be used) for the duration of the COVID-19 declaration under Section 564(b)(1) of the Act, 21 U.S.C. section 360bbb-3(b)(1), unless the authorization is terminated or revoked.  Performed at [facility], [address] [place], [place] [address]   Urine Culture     Status:  None   Collection Time:  09/06/21  2:25 AM   Specimen: Urine, Random  Result Value Ref Range Status   Specimen Description   Final    URINE, RANDOM Performed at [facility], [address], [place], [place] [address]    Special Requests   Final    NONE Performed at [facility], [address], [place], [place] [address]    Culture   Final    NO GROWTH Performed at [facility], [place] [address], [place], [place] [address]    Report Status 09/07/2021 FINAL  Final         Radiology Studies: ECHOCARDIOGRAM COMPLETE  Result Date: 09/07/2021    ECHOCARDIOGRAM REPORT   Patient Name:   MANASSEH PITTSLEY Date of Exam: 09/06/2021 Medical Rec #:  [id number]         Height:       72.0 in Accession #:    [id number]        Weight:       215.0 lb Date of Birth:  December 12, 1940         BSA:          2.197 m Patient Age:    41 years          BP:           144/90 mmHg Patient Gender: M                 HR:           76 bpm. Exam Location:  [facility] Procedure: 2D Echo, Cardiac Doppler and Color Doppler Indications:     Elevated Troponin  History:         Patient has prior history of Echocardiogram examinations, most                  recent 02/17/2019. Stroke, Arrythmias:Atrial Fibrillation; Risk                  Factors:Hypertension and Dyslipidemia.  Sonographer:     Cresenciano Lick RDCS Referring Phys:  [id number] AMY N COX Diagnosing Phys: Kate Sable MD IMPRESSIONS  1. Left ventricular ejection fraction, by estimation, is 50 to 55%. The left ventricle has low normal function. The left ventricle has no regional wall motion abnormalities. There is mild left ventricular hypertrophy. Left ventricular diastolic parameters are indeterminate.  2. Right ventricular systolic function is normal. The right ventricular size is normal.  3. Left atrial size was mildly dilated.  4. Right atrial size was mildly dilated.  5. The mitral valve is normal in structure. Mild mitral valve  regurgitation.  6. The aortic valve is tricuspid. Aortic valve regurgitation is mild. Aortic valve sclerosis/calcification is present, without any evidence of aortic stenosis.  7. Aortic dilatation noted. There is mild dilatation of the ascending aorta, measuring 39 mm.  8. The inferior vena cava is dilated in size with <50% respiratory variability, suggesting right atrial pressure of 15 mmHg. FINDINGS  Left Ventricle: Left ventricular ejection fraction, by estimation, is 50 to 55%. The left ventricle has low normal function. The left ventricle has no regional wall motion abnormalities. The left ventricular internal cavity size was normal in size. There is mild left ventricular hypertrophy. Left ventricular diastolic parameters are indeterminate. Right Ventricle: The right ventricular size is normal. No increase in right ventricular wall thickness. Right ventricular systolic function is normal. Left Atrium: Left atrial size was mildly dilated. Right Atrium: Right atrial size was mildly dilated. Pericardium: There is no  evidence of pericardial effusion. Mitral Valve: The mitral valve is normal in structure. Mild mitral valve regurgitation. Tricuspid Valve: The tricuspid valve is normal in structure. Tricuspid valve regurgitation is mild. Aortic Valve: The aortic valve is tricuspid. Aortic valve regurgitation is mild. Aortic regurgitation PHT measures 325 msec. Aortic valve sclerosis/calcification is present, without any evidence of aortic stenosis. Pulmonic Valve: The pulmonic valve was normal in structure. Pulmonic valve regurgitation is not visualized. Aorta: Aortic dilatation noted. There is mild dilatation of the ascending aorta, measuring 39 mm. Venous: The inferior vena cava is dilated in size with less than 50% respiratory variability, suggesting right atrial pressure of 15 mmHg. IAS/Shunts: No atrial level shunt detected by color flow Doppler.  LEFT VENTRICLE PLAX 2D LVIDd:         3.90 cm LVIDs:         2.85  cm LV PW:         1.40 cm LV IVS:        1.80 cm LVOT diam:     2.10 cm LV SV:         72 LV SV Index:   33 LVOT Area:     3.46 cm  RIGHT VENTRICLE            IVC RV Basal diam:  5.00 cm    IVC diam: 2.50 cm RV S prime:     7.69 cm/s TAPSE (M-mode): 1.8 cm LEFT ATRIUM             Index        RIGHT ATRIUM           Index LA diam:        5.80 cm 2.64 cm/m   RA Area:     22.00 cm LA Vol (A2C):   94.0 ml 42.78 ml/m  RA Volume:   77.50 ml  35.27 ml/m LA Vol (A4C):   69.4 ml 31.59 ml/m LA Biplane Vol: 86.6 ml 39.42 ml/m  AORTIC VALVE LVOT Vmax:   115.00 cm/s LVOT Vmean:  90.900 cm/s LVOT VTI:    0.207 m AI PHT:      325 msec  AORTA Ao Root diam: 3.90 cm Ao Asc diam:  3.90 cm MV E velocity: 91.70 cm/s  TRICUSPID VALVE                            TR Peak grad:   39.9 mmHg                            TR Vmax:        316.00 cm/s                             SHUNTS                            Systemic VTI:  0.21 m                            Systemic Diam: 2.10 cm Kate Sable MD Electronically signed by Kate Sable MD Signature Date/Time: 09/07/2021/3:21:50 PM    Final    DG HIP UNILAT WITH PELVIS 2-3 VIEWS LEFT  Result Date: 09/07/2021 CLINICAL DATA:  Left hip pain.  No known injury. EXAM: DG HIP (  WITH OR WITHOUT PELVIS) 2-3V LEFT COMPARISON:  None. FINDINGS: No evidence of joint space narrowing. No fracture or focal lesion. Other bones of the pelvis appear normal. Prior lumbar fusion. IMPRESSION: Negative pelvis and left hip. No evidence of degenerative or traumatic finding. Electronically Signed   By: Nelson Chimes M.D.   On: 09/07/2021 11:38        Scheduled Meds:  apixaban  5 mg Oral BID   carbamazepine  200 mg Oral BID   gabapentin  300 mg Oral TID   metoprolol tartrate  5 mg Intravenous Once   pantoprazole (PROTONIX) IV  40 mg Intravenous Q12H   tamsulosin  0.4 mg Oral QPC supper   Continuous Infusions:  cefTRIAXone (ROCEPHIN)  IV 2 g (09/08/21 0929)     LOS: 3 days    Time spent:  25 minutes    Sidney Ace, MD [facility]   If 7PM-7AM, please contact night-coverage  09/08/2021, 11:52 AM

## 2021-09-08 NOTE — Progress Notes (Signed)
PT Cancellation Note  Patient Details Name: [name] MRN: [id number] DOB: Mar 17, 1941   Cancelled Treatment:    Reason Eval/Treat Not Completed: Other (comment). Consult received and chart reviewed. Pt snoring in room with wife present. She reports he has been lethargic s/p narcotics this date. She prefers to let him sleep as he has been hallucinating. Discussed need for assessment for rehab placement (she is hoping to change facilities at time of discharge). She request therapist to come tomorrow AM when pt more awake and alert. Will re-attempt.   Kymia Simi 09/08/2021, 3:50 PM Elizabeth Palau, PT, DPT [phone]

## 2021-09-08 NOTE — TOC Initial Note (Signed)
Transition of Care [facility]) - Initial/Assessment Note    Patient Details  Name: [name] MRN: [id number] Date of Birth: 03/25/1941  [facility] CM/SW Contact:    Chapman Fitch, RN Phone Number: 09/08/2021, 3:29 PM  Clinical Narrative:                  Patient admitted from STR at [facility].  Per MD note "Patient has dementia and is able to give [place] history but briefly"  VM left for wife to complete assessment   Requested PT eval        Patient Goals and CMS Choice        Expected Discharge Plan and Services                                                Prior Living Arrangements/Services                       Activities of Daily Living Home Assistive Devices/Equipment: Grab bars around toilet, Bedside commode/3-in-1, Walker (specify type), Cane (specify quad or straight) ADL Screening (condition at time of admission) Patient's cognitive ability adequate to safely complete daily activities?: Yes Is the patient deaf or have difficulty hearing?: No Does the patient have difficulty seeing, even when wearing glasses/contacts?: No Does the patient have difficulty concentrating, remembering, or making decisions?: No Patient able to express need for assistance with ADLs?: Yes Does the patient have difficulty dressing or bathing?: Yes Independently performs ADLs?: No Communication: Independent Dressing (OT): Needs assistance Is this a change from baseline?: Pre-admission baseline Grooming: Needs assistance Is this a change from baseline?: Pre-admission baseline Feeding: Independent Bathing: Dependent Is this a change from baseline?: Pre-admission baseline Toileting: Dependent Is this a change from baseline?: Pre-admission baseline In/Out Bed: Dependent Is this a change from baseline?: Pre-admission baseline Walks in Home: Dependent Is this a change from baseline?: Pre-admission baseline Does the patient have difficulty  walking or climbing stairs?: Yes Weakness of Legs: Both Weakness of Arms/Hands: None  Permission Sought/Granted                  Emotional Assessment              Admission diagnosis:  Dysuria [R30.0] Pyelonephritis [N12] Pain [R52] Acute respiratory failure with hypoxia (HCC) [J96.01] Complicated UTI (urinary tract infection) [N39.0] Severe sepsis (HCC) [A41.9, R65.20] Sepsis without acute organ dysfunction, due to unspecified organism [facility]) [A41.9] Patient Active Problem List   Diagnosis Date Noted   Complicated UTI (urinary tract infection) 09/07/2021   Dysuria 09/05/2021   Severe sepsis (HCC) 09/05/2021   Anemia 09/05/2021   AKI (acute kidney injury) (HCC) 09/05/2021   Elevated glucose 09/05/2021   Acute pyelonephritis 09/05/2021   Closed left ankle fracture 08/19/2021   Occult GI bleeding 08/16/2021   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 [place] [address] Phone: [phone] Fax: [phone]     Social Determinants of Health (SDOH) Interventions    Readmission Risk Interventions No flowsheet data found.

## 2021-09-09 LAB — CBC WITH DIFFERENTIAL/PLATELET
Abs Immature Granulocytes: 0.19 10*3/uL — ABNORMAL HIGH (ref 0.00–0.07)
Basophils Absolute: 0 10*3/uL (ref 0.0–0.1)
Basophils Relative: 0 %
Eosinophils Absolute: 0.1 10*3/uL (ref 0.0–0.5)
Eosinophils Relative: 1 %
HCT: 31 % — ABNORMAL LOW (ref 39.0–52.0)
Hemoglobin: 9.4 g/dL — ABNORMAL LOW (ref 13.0–17.0)
Immature Granulocytes: 2 %
Lymphocytes Relative: 13 %
Lymphs Abs: 1.4 10*3/uL (ref 0.7–4.0)
MCH: 24 pg — ABNORMAL LOW (ref 26.0–34.0)
MCHC: 30.3 g/dL (ref 30.0–36.0)
MCV: 79.1 fL — ABNORMAL LOW (ref 80.0–100.0)
Monocytes Absolute: 0.9 10*3/uL (ref 0.1–1.0)
Monocytes Relative: 9 %
Neutro Abs: 8.1 10*3/uL — ABNORMAL HIGH (ref 1.7–7.7)
Neutrophils Relative %: 75 %
Platelets: 159 10*3/uL (ref 150–400)
RBC: 3.92 MIL/uL — ABNORMAL LOW (ref 4.22–5.81)
RDW: 22.9 % — ABNORMAL HIGH (ref 11.5–15.5)
Smear Review: NORMAL
WBC: 10.7 10*3/uL — ABNORMAL HIGH (ref 4.0–10.5)
nRBC: 0.2 % (ref 0.0–0.2)

## 2021-09-09 LAB — CULTURE, BLOOD (ROUTINE X 2): Special Requests: ADEQUATE

## 2021-09-09 LAB — BASIC METABOLIC PANEL
Anion gap: 4 — ABNORMAL LOW (ref 5–15)
BUN: 31 mg/dL — ABNORMAL HIGH (ref 8–23)
CO2: 23 mmol/L (ref 22–32)
Calcium: 8.5 mg/dL — ABNORMAL LOW (ref 8.9–10.3)
Chloride: 107 mmol/L (ref 98–111)
Creatinine, Ser: 1 mg/dL (ref 0.61–1.24)
GFR, Estimated: >60 mL/min (ref >60)
Glucose, Bld: 110 mg/dL — ABNORMAL HIGH (ref 70–99)
Potassium: 4.6 mmol/L (ref 3.5–5.1)
Sodium: 134 mmol/L — ABNORMAL LOW (ref 135–145)

## 2021-09-09 MED ORDER — VITAMIN B-12 1000 MCG PO TABS
1000.0000 ug | ORAL_TABLET | Freq: Every day | ORAL | Status: DC
  Administered 2021-09-09 – 2021-09-13 (×5): 1000 ug via ORAL
  Filled 2021-09-09 (×5): qty 1

## 2021-09-09 MED ORDER — PANTOPRAZOLE SODIUM 40 MG PO TBEC
40.0000 mg | DELAYED_RELEASE_TABLET | Freq: Every day | ORAL | Status: DC
  Administered 2021-09-09: 40 mg via ORAL
  Filled 2021-09-09: qty 1

## 2021-09-09 MED ORDER — IRBESARTAN 150 MG PO TABS
300.0000 mg | ORAL_TABLET | Freq: Every day | ORAL | Status: DC
  Administered 2021-09-09 – 2021-09-13 (×5): 300 mg via ORAL
  Filled 2021-09-09 (×5): qty 2

## 2021-09-09 MED ORDER — CHLORHEXIDINE GLUCONATE CLOTH 2 % EX PADS
6.0000 | MEDICATED_PAD | Freq: Every day | CUTANEOUS | Status: DC
  Administered 2021-09-10: 6 via TOPICAL

## 2021-09-09 MED ORDER — CEFAZOLIN SODIUM-DEXTROSE 2-4 GM/100ML-% IV SOLN
2.0000 g | Freq: Three times a day (TID) | INTRAVENOUS | Status: DC
  Administered 2021-09-09 – 2021-09-12 (×10): 2 g via INTRAVENOUS
  Filled 2021-09-09 (×12): qty 100

## 2021-09-09 MED ORDER — GABAPENTIN 300 MG PO CAPS: 300.0000 mg | ORAL_CAPSULE | Freq: Two times a day (BID) | ORAL | Status: DC

## 2021-09-09 MED ORDER — FUROSEMIDE 40 MG PO TABS
40.0000 mg | ORAL_TABLET | Freq: Every day | ORAL | Status: DC
  Administered 2021-09-09 – 2021-09-13 (×5): 40 mg via ORAL
  Filled 2021-09-09 (×5): qty 1

## 2021-09-09 NOTE — Progress Notes (Signed)
PROGRESS NOTE    [name]  QAS:[id number] DOB: 1940-11-09 DOA: 09/05/2021 PCP: [name], MD    Brief Narrative:  80 y.o. male seen in ed with complaints of  Dysuria fever and chills since Friday evening.Pt is not able to start a stream.  Pt also reports diarrhea. Incomplete emptying.  Patient has dementia and is able to give [place] history but briefly.  And was seen about a week ago for acute urinary retention and had a Foley placed and was discharged with urology follow-up which he has yet to do as the appointment is upcoming.  Currently patient denies any headaches blurred vision chest pain shortness of breath nausea vomiting.  Patient states that he has had an intracranial hemorrhage and had to have neurosurgery at [facility].  He is on Eliquis currently and has been cleared through his cardiologist.  The following morning patient states that he has some pain in the hip and left leg status post fracture.  No specific complaints of abdominal pain on my evaluation however per son at bedside patient had excruciating abdominal pain prior to presentation.  Blood cultures now positive for E. Coli  Patient hemodynamics have been in proved.  Abdominal pain and orthopedic pain also improved.  Pain regimen has been difficult to achieve as patient's mental status is readily affected.  Narcotics and gabapentin seem to have deleterious effects on his mentation.   Assessment & Plan:   Principal Problem:   Dysuria Active Problems:   Atrial fibrillation (HCC)   Hypertension   Seizures (HCC)   Stroke (HCC)   DVT (deep venous thrombosis) (HCC)   History of cerebral hemorrhage   Occult GI bleeding   Severe sepsis (HCC)   Anemia   AKI (acute kidney injury) ([place])   Elevated glucose   Acute pyelonephritis   Complicated UTI (urinary tract infection)   Complicated urinary tract infection E. coli bacteremia Urinary retention Severe sepsis secondary to above Retention followed hospitalization  and subsequent rehab stay status post left leg fracture Severe sepsis met by heart rate, temperature, elevated lactic acid CT abdomen with cystitis and possible pyelonephritis No stone or abscess Hemodynamics reassuring E. coli pansensitive Plan: IV antibiotics per pharmacy recommendations No IV fluids Continue Foley catheter Continue daily Flomax Consider voiding trial in the next 24 hours  Demand ischemia/elevated troponins ACS ruled out No changes on EKG, no chest pain Troponin peaked at 624, likely secondary to sepsis TTE: EF 55-60 with no WMA Plan: DC telemetry Outpatient cardiology follow-up  Acute on chronic anemia No indication for transfusion currently  Left hip pain Acute on chronic, suspect referred pain from left ankle fracture Plain film x-ray negative for fracture Does not respond well to pain medication.  Will discontinue all sedating pain medications and proceed with Tylenol only  Left ankle fracture Status post operative repair.  Now in cast.  Coming from SNF Issues urinary retention started after hospitalization and no surgery Therapy evaluations as able  Paroxysmal atrial fibrillation Continue home Eliquis  Seizure disorder Per patient no seizures since late 80s Continue home Tegretol  Essential hypertension Home antihypertensives on hold in setting of sepsis   DVT prophylaxis: Eliquis Code Status: Full Family Communication: Spouse and son at bedside 12/14, 12/15, spouse at bedside 12/16 Disposition Plan: Status is: Inpatient  Remains inpatient appropriate because: Severe sepsis, E. coli bacteremia in the setting of urinary retention, complicated UTI.  Patient having some altered mentation either medication effect or hospital delirium.  Reassess in 24 hours  for disposition plan.      Level of care: Med-Surg  Consultants:  None  Procedures:  None  Antimicrobials: Rocephin   Subjective: patient seen and examined.  Pain control  improved.  Wife at bedside  Objective: Vitals:   09/08/21 1557 09/08/21 1942 09/09/21 0525 09/09/21 0752  BP: (!) 145/78 (!) 124/97 129/83 (!) 134/97  Pulse: 97 97 92 85  Resp: $Remo'18 18 20 20  'xsaua$ Temp: 97.8 F (36.6 C) 98.1 F (36.7 C) 97.6 F (36.4 C) (!) 97.4 F (36.3 C)  TempSrc: Oral  Oral Oral  SpO2: 94% 92% 96% 97%  Weight:      Height:        Intake/Output Summary (Last 24 hours) at 09/09/2021 1311 Last data filed at 09/09/2021 1000 Gross per 24 hour  Intake 115.92 ml  Output 2000 ml  Net -1884.08 ml   Filed Weights   09/05/21 1518  Weight: 97.5 kg    Examination:  General exam: No apparent distress Respiratory system: Lungs clear.  Normal work of breathing.  Room air Cardiovascular system: S1-S2, RRR, no murmurs, no pedal edema Gastrointestinal system: Soft, NT/ND, normal bowel sounds Central nervous system: Alert and oriented. No focal neurological deficits. Extremities: LLE in surgical cast Skin: No rashes, lesions or ulcers Psychiatry: Judgement and insight appear impaired. Mood & affect flattened.     Data Reviewed: I have personally reviewed following labs and imaging studies  CBC: Recent Labs  Lab 09/05/21 1521 09/06/21 0225 09/06/21 0707 09/06/21 1401 09/07/21 0701 09/08/21 0249 09/09/21 0753  WBC 26.0*   < > 26.9* 19.7* 19.1* 13.4* 10.7*  NEUTROABS 23.6*  --   --   --   --   --  8.1*  HGB 10.6*   < > 8.0* 8.9* 8.9* 8.9* 9.4*  HCT 35.7*   < > 26.2* 28.0* 28.9* 28.9* 31.0*  MCV 77.3*   < > 76.2* 77.6* 78.1* 78.3* 79.1*  PLT 268   < > 168 152 150 158 159   < > = values in this interval not displayed.   Basic Metabolic Panel: Recent Labs  Lab 09/05/21 1521 09/05/21 2344 09/06/21 0707 09/06/21 1401 09/07/21 0701 09/09/21 0753  NA 134* 132* 131* 132*  --  134*  K 3.7 3.3* 3.9 3.7  --  4.6  CL 96* 100 100 103  --  107  CO2 $Re'27 25 26 25  'toP$ --  23  GLUCOSE 137* 115* 152* 103*  --  110*  BUN 23 27* 29* 29*  --  31*  CREATININE 1.31* 1.23  1.26* 1.14 1.14 1.00  CALCIUM 9.3 8.4* 7.8* 7.8*  --  8.5*  MG  --  1.6* 2.3  --   --   --   PHOS  --  1.5* 5.0*  --   --   --    GFR: Estimated Creatinine Clearance: 70.1 mL/min (by C-G formula based on SCr of 1 mg/dL). Liver Function Tests: Recent Labs  Lab 09/05/21 1521 09/05/21 2344 09/06/21 0707  AST 30 30 33  ALT $Re'19 17 17  'VYv$ ALKPHOS 140* 120 99  BILITOT 1.1 1.3* 1.1  PROT 7.3 5.6* 5.2*  ALBUMIN 3.4* 2.7* 2.4*   Recent Labs  Lab 09/05/21 1536  LIPASE 25   No results for input(s): AMMONIA in the last 168 hours. Coagulation Profile: Recent Labs  Lab 09/05/21 1521 09/06/21 0225  INR 1.7* 2.2*   Cardiac Enzymes: No results for input(s): CKTOTAL, CKMB, CKMBINDEX, TROPONINI in the last 168  hours. BNP (last 3 results) No results for input(s): PROBNP in the last 8760 hours. HbA1C: No results for input(s): HGBA1C in the last 72 hours.  CBG: Recent Labs  Lab 09/06/21 0230  GLUCAP 118*   Lipid Profile: No results for input(s): CHOL, HDL, LDLCALC, TRIG, CHOLHDL, LDLDIRECT in the last 72 hours. Thyroid Function Tests: No results for input(s): TSH, T4TOTAL, FREET4, T3FREE, THYROIDAB in the last 72 hours.  Anemia Panel: No results for input(s): VITAMINB12, FOLATE, FERRITIN, TIBC, IRON, RETICCTPCT in the last 72 hours.  Sepsis Labs: Recent Labs  Lab 09/05/21 1528 09/05/21 1536 09/05/21 2121 09/05/21 2344 09/06/21 0707 09/06/21 1401  PROCALCITON  --  26.01  --   --  42.04  --   LATICACIDVEN 3.1*  --  6.4* 3.5*  --  1.4    Recent Results (from the past 240 hour(s))  Culture, blood (Routine x 2)     Status: Abnormal   Collection Time: 09/05/21  3:20 PM   Specimen: BLOOD  Result Value Ref Range Status   Specimen Description   Final    BLOOD LEFT ANTECUBITAL Performed at [facility], [address] [place], [place] [address]    Special Requests   Final    BOTTLES DRAWN AEROBIC AND ANAEROBIC Blood Culture adequate volume Performed at [facility], [address] [place], [place] [address]    Culture  Setup Time   Final    GRAM NEGATIVE RODS ANAEROBIC BOTTLE ONLY CRITICAL RESULT CALLED TO, READ BACK BY AND VERIFIED WITH: Virl Cagey $[email or web address]'[email or web address]$  on 09/06/21 skl    Culture ESCHERICHIA COLI (A)  Final   Report Status 09/09/2021 FINAL  Final   Organism ID, Bacteria ESCHERICHIA COLI  Final      Susceptibility   Escherichia coli - MIC*    AMPICILLIN 4 SENSITIVE Sensitive     CEFAZOLIN <=4 SENSITIVE Sensitive     CEFEPIME <=0.12 SENSITIVE Sensitive     CEFTAZIDIME <=1 SENSITIVE Sensitive     CEFTRIAXONE <=0.25 SENSITIVE Sensitive     CIPROFLOXACIN <=0.25 SENSITIVE Sensitive     GENTAMICIN <=1 SENSITIVE Sensitive     IMIPENEM <=0.25 SENSITIVE Sensitive     TRIMETH/SULFA <=20 SENSITIVE Sensitive     AMPICILLIN/SULBACTAM <=2 SENSITIVE Sensitive     PIP/TAZO <=4 SENSITIVE Sensitive     * ESCHERICHIA COLI  Culture, blood (Routine x 2)     Status: None (Preliminary result)   Collection Time: 09/05/21  3:20 PM   Specimen: BLOOD  Result Value Ref Range Status   Specimen Description BLOOD BLOOD LEFT ARM  Final   Special Requests   Final    BOTTLES DRAWN AEROBIC AND ANAEROBIC Blood Culture adequate volume   Culture   Final    NO GROWTH 3 DAYS Performed at [facility], [address] [place], [place] [address]    Report Status PENDING  Incomplete  Blood Culture ID Panel (Reflexed)     Status: Abnormal   Collection Time: 09/05/21  3:20 PM  Result Value Ref Range Status   Enterococcus faecalis NOT DETECTED NOT DETECTED Final   Enterococcus Faecium NOT DETECTED NOT DETECTED Final   Listeria monocytogenes NOT DETECTED NOT DETECTED Final   Staphylococcus species NOT DETECTED NOT DETECTED Final   Staphylococcus aureus (BCID) NOT DETECTED NOT DETECTED Final   Staphylococcus epidermidis NOT DETECTED NOT DETECTED Final   Staphylococcus lugdunensis NOT DETECTED NOT DETECTED Final   Streptococcus species NOT DETECTED  NOT DETECTED Final   Streptococcus  agalactiae NOT DETECTED NOT DETECTED Final   Streptococcus pneumoniae NOT DETECTED NOT DETECTED Final   Streptococcus pyogenes NOT DETECTED NOT DETECTED Final   A.calcoaceticus-baumannii NOT DETECTED NOT DETECTED Final   Bacteroides fragilis NOT DETECTED NOT DETECTED Final   Enterobacterales DETECTED (A) NOT DETECTED Final    Comment: Enterobacterales represent a large order of gram negative bacteria, not a single organism. CRITICAL RESULT CALLED TO, READ BACK BY AND VERIFIED WITH: Virl Cagey $RemoveBe'@2146'[email or web address]$  on 09/06/21 skl    Enterobacter cloacae complex NOT DETECTED NOT DETECTED Final   Escherichia coli DETECTED (A) NOT DETECTED Final    Comment: CRITICAL RESULT CALLED TO, READ BACK BY AND VERIFIED WITH: Virl Cagey $RemoveBe'@2146'[email or web address]$  on 09/06/21 skl    Klebsiella aerogenes NOT DETECTED NOT DETECTED Final   Klebsiella oxytoca NOT DETECTED NOT DETECTED Final   Klebsiella pneumoniae NOT DETECTED NOT DETECTED Final   Proteus species NOT DETECTED NOT DETECTED Final   Salmonella species NOT DETECTED NOT DETECTED Final   Serratia marcescens NOT DETECTED NOT DETECTED Final   Haemophilus influenzae NOT DETECTED NOT DETECTED Final   Neisseria meningitidis NOT DETECTED NOT DETECTED Final   Pseudomonas aeruginosa NOT DETECTED NOT DETECTED Final   Stenotrophomonas maltophilia NOT DETECTED NOT DETECTED Final   Candida albicans NOT DETECTED NOT DETECTED Final   Candida auris NOT DETECTED NOT DETECTED Final   Candida glabrata NOT DETECTED NOT DETECTED Final   Candida krusei NOT DETECTED NOT DETECTED Final   Candida parapsilosis NOT DETECTED NOT DETECTED Final   Candida tropicalis NOT DETECTED NOT DETECTED Final   Cryptococcus neoformans/gattii NOT DETECTED NOT DETECTED Final   CTX-M ESBL NOT DETECTED NOT DETECTED Final   Carbapenem resistance IMP NOT DETECTED NOT DETECTED Final   Carbapenem resistance KPC NOT DETECTED NOT DETECTED Final   Carbapenem resistance NDM NOT DETECTED  NOT DETECTED Final   Carbapenem resist OXA 48 LIKE NOT DETECTED NOT DETECTED Final   Carbapenem resistance VIM NOT DETECTED NOT DETECTED Final    Comment: Performed at [facility], [address] [place],  [address]  Resp Panel by RT-PCR (Flu A&B, Covid) Nasopharyngeal Swab     Status: None   Collection Time: 09/05/21  3:21 PM   Specimen: Nasopharyngeal Swab; Nasopharyngeal(NP) swabs in vial transport medium  Result Value Ref Range Status   SARS Coronavirus 2 by RT PCR NEGATIVE NEGATIVE Final    Comment: (NOTE) SARS-CoV-2 target nucleic acids are NOT DETECTED.  The SARS-CoV-2 RNA is generally detectable in upper respiratory specimens during the acute phase of infection. The lowest concentration of SARS-CoV-2 viral copies this assay can detect is 138 copies/mL. A negative result does not preclude SARS-Cov-2 infection and should not be used as the sole basis for treatment or other patient management decisions. A negative result may occur with  improper specimen collection/handling, submission of specimen other than nasopharyngeal swab, presence of viral mutation(s) within the areas targeted by this assay, and inadequate number of viral copies(<138 copies/mL). A negative result must be combined with clinical observations, patient history, and epidemiological information. The expected result is Negative.  Fact Sheet for Patients:  EntrepreneurPulse.com.au  Fact Sheet for Healthcare Providers:  IncredibleEmployment.be  This test is no t yet approved or cleared by the [place] FDA and  has been authorized for detection and/or diagnosis of SARS-CoV-2 by FDA under an Emergency Use Authorization (EUA). This EUA will remain  in effect (meaning this test can be used) for the duration of the COVID-19 declaration under Section 564(b)(1) of  the Act, 21 U.S.C.section 360bbb-3(b)(1), unless the authorization is terminated  or revoked  sooner.       Influenza A by PCR NEGATIVE NEGATIVE Final   Influenza B by PCR NEGATIVE NEGATIVE Final    Comment: (NOTE) The Xpert Xpress SARS-CoV-2/FLU/RSV plus assay is intended as an aid in the diagnosis of influenza from Nasopharyngeal swab specimens and should not be used as a sole basis for treatment. Nasal washings and aspirates are unacceptable for Xpert Xpress SARS-CoV-2/FLU/RSV testing.  Fact Sheet for Patients: EntrepreneurPulse.com.au  Fact Sheet for Healthcare Providers: IncredibleEmployment.be  This test is not yet approved or cleared by the [place] FDA and has been authorized for detection and/or diagnosis of SARS-CoV-2 by FDA under an Emergency Use Authorization (EUA). This EUA will remain in effect (meaning this test can be used) for the duration of the COVID-19 declaration under Section 564(b)(1) of the Act, 21 U.S.C. section 360bbb-3(b)(1), unless the authorization is terminated or revoked.  Performed at [facility], [address], [place], [place] [address]   Urine Culture     Status: None   Collection Time: 09/06/21  2:25 AM   Specimen: Urine, Random  Result Value Ref Range Status   Specimen Description   Final    URINE, RANDOM Performed at [facility], [address] [place], [place] [address]    Special Requests   Final    NONE Performed at [facility], [address], [place], [place] [address]    Culture   Final    NO GROWTH Performed at [facility], [address], [place], [place] [address]    Report Status 09/07/2021 FINAL  Final         Radiology Studies: No results found.      Scheduled Meds:  apixaban  5 mg Oral BID   carbamazepine  200 mg Oral BID   furosemide  40 mg Oral Daily   irbesartan  300 mg Oral Daily   metoprolol tartrate  5 mg Intravenous Once   pantoprazole  40 mg Oral QHS   tamsulosin  0.4 mg Oral QPC supper   vitamin B-12   1,000 mcg Oral Daily   Continuous Infusions:   ceFAZolin (ANCEF) IV 2 g (09/09/21 1135)     LOS: 4 days    Time spent: 25 minutes    Sidney Ace, MD [facility]   If 7PM-7AM, please contact night-coverage  09/09/2021, 1:11 PM

## 2021-09-09 NOTE — Evaluation (Signed)
Physical Therapy Evaluation Patient Details Name: [name] MRN: [id number] DOB: 01-Aug-1941 Today's Date: 09/09/2021  History of Present Illness  Pt is an 80 y.o. male presenting to hospital 12/12 from [facility] with fever; foley catheter (for urinary retention and bladder spasms) removed 3 days prior and since then pt demonstrating N/V/D and dysuria.  Pt admitted with complicated UTI, urinary retention, severe sepsis, E. Coli bacteremia, demand ischemia/elevated troponins (ACS ruled out), acute on chronic anemia, and L hip pain (imaging negative for fx).  Of note, pt s/p ORIF L bimalleolar ankle fx 08/19/21 (NWB'ing).  PMH includes htn, HLD, sz's, a-fib on Eliquis, DVT, chronic LBP, PAH, stroke (L LE weakness), SAH, peripheral vascular thrombectomy, L TKA, retinal detachment surgery, and posterior lumbar fusion.  Clinical Impression  Prior to hospital admission, pt was recently at rehab and able to perform w/c level transfers with assist; prior to fall in November 2022 pt was ambulatory with RW.  R LE weakness noted especially R DF (pt and pt's wife reports R DF weakness is new since this Monday although significant R DF strength impairments also noted on PT eval 08/20/21).  Pt with limited L hip A/PROM flexion d/t c/o sharp L hip pain with movement and pt had difficulty sitting onto L hip (sitting edge of bed) d/t pain causing pt to lean more towards R side.  Twitching/jerking type movements noted during session with movement (mostly R UE with use in sitting but also noted with R hip flexion AROM and minimal with L UE).  MD and pt's nurse notified of pt's twitching/jerking type movements, strength impairments, and hip pain.  Currently pt is max assist x2 for bed mobility and min assist for sitting balance plus SBA of 2nd for safety.  Unable to perform lateral scoot to R along bed with 2 assist and max cueing d/t pt's overall weakness, NWB'ing L LE, and difficulty assisting with transfer.  Pt  would benefit from skilled PT to address noted impairments and functional limitations (see below for any additional details).  Upon hospital discharge, pt would benefit from SNF.    Recommendations for follow up therapy are one component of a multi-disciplinary discharge planning process, led by the attending physician.  Recommendations may be updated based on patient status, additional functional criteria and insurance authorization.  Follow Up Recommendations Skilled nursing-short term rehab (<3 hours/day)    Assistance Recommended at Discharge Frequent or constant Supervision/Assistance  Functional Status Assessment Patient has had a recent decline in their functional status and demonstrates the ability to make significant improvements in function in a reasonable and predictable amount of time.  Equipment Recommendations  Wheelchair (measurements PT);Wheelchair cushion (measurements PT);BSC/3in1    Recommendations for Other Services OT consult     Precautions / Restrictions Precautions Precautions: Fall Precaution Comments: Seizure precautions; Aspiration precautions Restrictions Weight Bearing Restrictions: Yes LLE Weight Bearing: Non weight bearing      Mobility  Bed Mobility Overal bed mobility: Needs Assistance Bed Mobility: Supine to Sit;Sit to Supine     Supine to sit: Max assist;+2 for physical assistance Sit to supine: Max assist;+2 for physical assistance   General bed mobility comments: pt unable to move B LE's towards edge of bed without significant assist; assist for trunk and B LE's; vc's for technique    Transfers Overall transfer level: Needs assistance Equipment used: None Transfers: Bed to chair/wheelchair/BSC            Lateral/Scoot Transfers: Total assist General transfer comment: pt set  up for lateral scoot (sitting edge of bed) towards R but unable to perform transfer with 2 assist and max cueing for technique (d/t pt's overall weakness and pt's  difficulty assisting with transfer)    Ambulation/Gait               General Gait Details: not appropriate at this time  Stairs            Wheelchair Mobility    Modified Rankin (Stroke Patients Only)       Balance Overall balance assessment: Needs assistance Sitting-balance support: Bilateral upper extremity supported (R LE support (L LE NWB'ing)) Sitting balance-Leahy Scale: Poor Sitting balance - Comments: min assist for static sitting balance; pt's R UE twitching/jerking when either attempting to support self with R UE on bed or holding onto bed rail                                     Pertinent Vitals/Pain Faces Pain Scale: Hurts a little bit (2/10 at rest B hips; 8-9/10 L hip and 5/10 R hip pain with activities) Pain Location: L>R hip Pain Descriptors / Indicators: Sharp Pain Intervention(s): Limited activity within patient's tolerance;Monitored during session;Repositioned Vitals (HR and O2 on room air) stable and WFL throughout treatment session.    Home Living Family/patient expects to be discharged to:: Skilled nursing facility Living Arrangements: Spouse/significant other Available Help at Discharge: Family Type of Home: House Home Access: Stairs to enter Entrance Stairs-Rails: Right Entrance Stairs-Number of Steps: 2   Home Layout: One level Home Equipment: Agricultural consultant (2 wheels);Cane - single point;Grab bars - tub/shower;Grab bars - toilet;Shower seat - built in      Prior Function Prior Level of Function : Independent/Modified Independent             Mobility Comments: Prior to fall in November 2022, pt was ambulating with RW.  Most recently requiring assist for w/c level transfers at rehab facility (NWB'ing R LE). ADLs Comments: Pt's wife does cooking and cleaning.  Pt was able to dress self prior to November hospitalization.     Hand Dominance   Dominant Hand: Right    Extremity/Trunk Assessment   Upper Extremity  Assessment Upper Extremity Assessment: RUE deficits/detail;LUE deficits/detail RUE Deficits / Details: good R hand grip strength; 4/5 shoulder flexion; 4/5 elbow flexion/extension LUE Deficits / Details: Good L hand grip strength; 4/5 shoulder flexion, 4/5 elbow flexion/extension    Lower Extremity Assessment Lower Extremity Assessment: RLE deficits/detail;LLE deficits/detail RLE Deficits / Details: R DF 2-/5 (per PT eval about 2 weeks prior, DF 0-1/5); hip flexion 2+/5; knee flexion/extension 3/5 LLE Deficits / Details: able to wiggle toes; hip flexion unable to be tested d/t significant L hip pain with movement (even PROM); at least 2/5 knee flexion/extension (complicated d/t heavy L LE cast) LLE: Unable to fully assess due to immobilization (L LE in cast)    Cervical / Trunk Assessment Cervical / Trunk Assessment: Other exceptions Cervical / Trunk Exceptions: forward head/shoulders  Communication   Communication: No difficulties  Cognition Arousal/Alertness: Awake/alert Behavior During Therapy: WFL for tasks assessed/performed Overall Cognitive Status: Within Functional Limits for tasks assessed                                 General Comments: Oriented to person, place, general time, and general situation.  Increased time  to respond to questions and initiate movement for activities.        General Comments  Nursing cleared pt for participation in physical therapy.  Pt and pt's wife agreeable to PT session.     Exercises Total Joint Exercises Heel Slides: AAROM;Strengthening;Right;10 reps;Supine   Assessment/Plan    PT Assessment Patient needs continued PT services  PT Problem List Decreased strength;Decreased range of motion;Decreased activity tolerance;Decreased balance;Decreased mobility;Decreased knowledge of use of DME;Decreased safety awareness;Decreased knowledge of precautions;Pain       PT Treatment Interventions DME instruction;Gait  training;Functional mobility training;Therapeutic activities;Therapeutic exercise;Balance training;Patient/family education    PT Goals (Current goals can be found in the Care Plan section)  Acute Rehab PT Goals Patient Stated Goal: to improve strength and mobility PT Goal Formulation: With patient/family Time For Goal Achievement: 09/23/21 Potential to Achieve Goals: Fair    Frequency Min 2X/week   Barriers to discharge Decreased caregiver support      Co-evaluation PT/OT/SLP Co-Evaluation/Treatment: Yes Reason for Co-Treatment: Complexity of the patient's impairments (multi-system involvement);For patient/therapist safety;To address functional/ADL transfers PT goals addressed during session: Mobility/safety with mobility;Balance OT goals addressed during session: ADL's and self-care       AM-PAC PT "6 Clicks" Mobility  Outcome Measure Help needed turning from your back to your side while in a flat bed without using bedrails?: A Lot Help needed moving from lying on your back to sitting on the side of a flat bed without using bedrails?: Total Help needed moving to and from a bed to a chair (including a wheelchair)?: Total Help needed standing up from a chair using your arms (e.g., wheelchair or bedside chair)?: Total Help needed to walk in hospital room?: Total Help needed climbing 3-5 steps with a railing? : Total 6 Click Score: 7    End of Session   Activity Tolerance: Patient limited by fatigue Patient left: in bed;with call bell/phone within reach;with bed alarm set;with family/visitor present;Other (comment) (B LE's elevated on pillows with heels floating) Nurse Communication: Mobility status;Precautions;Weight bearing status;Other (comment) (pt's weakness and twitching/jerking concerns) PT Visit Diagnosis: Other abnormalities of gait and mobility (R26.89);Muscle weakness (generalized) (M62.81);History of falling (Z91.81);Difficulty in walking, not elsewhere classified  (R26.2);Pain Pain - Right/Left: Left Pain - part of body: Hip    Time: 7858-8502 PT Time Calculation (min) (ACUTE ONLY): 42 min   Charges:   PT Evaluation $PT Eval Low Complexity: 1 Low PT Treatments $Therapeutic Activity: 8-22 mins       Hendricks Limes, PT 09/09/21, 1:41 PM

## 2021-09-09 NOTE — TOC Initial Note (Signed)
Transition of Care [facility]) - Initial/Assessment Note    Patient Details  Name: [name] MRN: [id number] Date of Birth: 10/06/1940  Transition [facility] CM/SW Contact:    Maree Krabbe, LCSW Phone Number: 09/09/2021, 3:07 PM  Clinical Narrative:    CSW spoke with pt's spouse present at bedside. Pt was at [facility] however, does not prefer to go back there. They would prefer [place] placement. CSW has sent out a referral to [facility] and [facility] awaiting response. Will provide bed offers once available.                Expected Discharge Plan: Skilled Nursing Facility Barriers to Discharge: Continued Medical Work up   Patient Goals and CMS Choice Patient states their goals for this hospitalization and ongoing recovery are:: to go to snf   Choice offered to / list presented to : Spouse  Expected Discharge Plan and Services Expected Discharge Plan: Skilled Nursing Facility In-house Referral: NA   Post Acute Care Choice: Skilled Nursing Facility Living arrangements for the past 2 months: Skilled Nursing Facility                                      Prior Living Arrangements/Services Living arrangements for the past 2 months: Skilled Nursing Facility Lives with:: Facility Resident Patient language and need for interpreter reviewed:: Yes Do you feel safe going back to the place where you live?: Yes      Need for Family Participation in Patient Care: Yes (Comment) Care giver support system in place?: Yes (comment)   Criminal Activity/Legal Involvement Pertinent to Current Situation/Hospitalization: No - Comment as needed  Activities of Daily Living Home Assistive Devices/Equipment: Grab bars around toilet, Bedside commode/3-in-1, Walker (specify type), Cane (specify quad or straight) ADL Screening (condition at time of admission) Patient's cognitive ability adequate to safely complete daily activities?: Yes Is the patient deaf or have difficulty  hearing?: No Does the patient have difficulty seeing, even when wearing glasses/contacts?: No Does the patient have difficulty concentrating, remembering, or making decisions?: No Patient able to express need for assistance with ADLs?: Yes Does the patient have difficulty dressing or bathing?: Yes Independently performs ADLs?: No Communication: Independent Dressing (OT): Needs assistance Is this a change from baseline?: Pre-admission baseline Grooming: Needs assistance Is this a change from baseline?: Pre-admission baseline Feeding: Independent Bathing: Dependent Is this a change from baseline?: Pre-admission baseline Toileting: Dependent Is this a change from baseline?: Pre-admission baseline In/Out Bed: Dependent Is this a change from baseline?: Pre-admission baseline Walks in Home: Dependent Is this a change from baseline?: Pre-admission baseline Does the patient have difficulty walking or climbing stairs?: Yes Weakness of Legs: Both Weakness of Arms/Hands: None  Permission Sought/Granted Permission sought to share information with : Family Supports Permission granted to share information with : Yes, Verbal Permission Granted  Share Information with NAME: [name]     Permission granted to share info w Relationship: spouse     Emotional Assessment Appearance:: Appears stated age Attitude/Demeanor/Rapport: Engaged   Orientation: : Oriented to Self, Oriented to Place, Oriented to  Time, Oriented to Situation Alcohol / Substance Use: Not Applicable Psych Involvement: No (comment)  Admission diagnosis:  Dysuria [R30.0] Pyelonephritis [N12] Pain [R52] Acute respiratory failure with hypoxia (HCC) [J96.01] Complicated UTI (urinary tract infection) [N39.0] Severe sepsis (HCC) [A41.9, R65.20] Sepsis without acute organ dysfunction, due to unspecified organism (HCC) [A41.9]  Patient Active Problem List   Diagnosis Date Noted   Complicated UTI (urinary tract infection) 09/07/2021    Dysuria 09/05/2021   Severe sepsis (HCC) 09/05/2021   Anemia 09/05/2021   AKI (acute kidney injury) (HCC) 09/05/2021   Elevated glucose 09/05/2021   Acute pyelonephritis 09/05/2021   Closed left ankle fracture 08/19/2021   Occult GI bleeding 08/16/2021   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/10/2020   History of cerebral hemorrhage 10/27/2019   Arthritis 10/21/2019   Hyperlipidemia 10/21/2019   Hypertension 10/21/2019   Seizures (HCC) 10/21/2019   Stroke (HCC) 10/21/2019   DVT (deep venous thrombosis) (HCC) 10/21/2019   Atrial fibrillation (HCC) 02/17/2019   Bilateral swelling of feet 01/23/2009   Swelling of both ankles 09/25/2001   PCP:  Jerl Mina, MD Pharmacy:   [id number] [place], [place] - [address] AT [facility] [address] [place] [address] Phone: [phone] Fax: [phone]     Social Determinants of Health (SDOH) Interventions    Readmission Risk Interventions No flowsheet data found.

## 2021-09-09 NOTE — Progress Notes (Signed)
Stool sample for Gastrointestinal panel not obtained this shift.  Nurse will collect when patient has stool. MD made aware.

## 2021-09-09 NOTE — Evaluation (Signed)
Occupational Therapy Evaluation Patient Details Name: [name] MRN: [id number] DOB: Mar 06, 1941 Today's Date: 09/09/2021   History of Present Illness Pt is an 80 y.o. male presenting to hospital 12/12 from [facility] with fever; foley catheter (for urinary retention and bladder spasms) removed 3 days prior and since then pt demonstrating N/V/D and dysuria.  Pt admitted with complicated UTI, urinary retention, severe sepsis, E. Coli bacteremia, demand ischemia/elevated troponins (ACS ruled out), acute on chronic anemia, and L hip pain (imaging negative for fx).  Of note, pt s/p ORIF L bimalleolar ankle fx 08/19/21 (NWB'ing).  PMH includes htn, HLD, sz's, a-fib on Eliquis, DVT, chronic LBP, PAH, stroke (L LE weakness), SAH, peripheral vascular thrombectomy, L TKA, retinal detachment surgery, and posterior lumbar fusion.   Clinical Impression   [name] was seen for OT/PT co-evaluation this date. Prior to hospital admission, pt was recently at rehab transferring to/from w/c (prior to STR stay pt was MOD I using RW). Pt presents to acute OT demonstrating impaired ADL performance and functional mobility 2/2 decreased activity tolerance and functional strength/ROM/balance deficits. Upon arrival pt reclined in bed with wife and PT at bedside/  Pt currently requires MAX A don B socks at bed level. MAX A x2 sup<>sit. MIN A sitting balance in preparation for grooming tasks, pt unable to maintain grasp on cup using BUE for self drinking 2/2 uncontrollable jerking/tremor. Pt would benefit from skilled OT to address noted impairments and functional limitations (see below for any additional details) in order to maximize safety and independence while minimizing falls risk and caregiver burden. Upon hospital discharge, recommend STR to maximize pt safety and return to PLOF.       Recommendations for follow up therapy are one component of a multi-disciplinary discharge planning process, led by the  attending physician.  Recommendations may be updated based on patient status, additional functional criteria and insurance authorization.   Follow Up Recommendations  Skilled nursing-short term rehab (<3 hours/day)    Assistance Recommended at Discharge Frequent or constant Supervision/Assistance  Functional Status Assessment  Patient has had a recent decline in their functional status and demonstrates the ability to make significant improvements in function in a reasonable and predictable amount of time.  Equipment Recommendations  Other (comment) (defer to next venue of care)    Recommendations for Other Services       Precautions / Restrictions Precautions Precautions: Fall Precaution Comments: Seizure precautions; Aspiration precautions Required Braces or Orthoses: Splint/Cast Splint/Cast: LLE cast Restrictions Weight Bearing Restrictions: Yes LLE Weight Bearing: Non weight bearing      Mobility Bed Mobility Overal bed mobility: Needs Assistance Bed Mobility: Supine to Sit;Sit to Supine     Supine to sit: Max assist;+2 for physical assistance Sit to supine: Max assist;+2 for physical assistance   General bed mobility comments: pt unable to move B LE's towards edge of bed without significant assist; assist for trunk and B LE's; vc's for technique    Transfers Overall transfer level: Needs assistance Equipment used: None Transfers: Bed to chair/wheelchair/BSC            Lateral/Scoot Transfers: Total assist General transfer comment: pt set up for lateral scoot (sitting edge of bed) towards R but unable to perform transfer with 2 assist and max cueing for technique (d/t pt's overall weakness and pt's difficulty assisting with transfer)      Balance Overall balance assessment: Needs assistance Sitting-balance support: Bilateral upper extremity supported Sitting balance-Leahy Scale: Poor Sitting balance - Comments:  min assist for static sitting balance; pt's R UE  twitching/jerking when either attempting to support self with R UE on bed or holding onto bed rail                                   ADL either performed or assessed with clinical judgement   ADL Overall ADL's : Needs assistance/impaired                                       General ADL Comments: MAX A don B socks at bed level. MIN A sitting balance in preparation for grooming tasks, pt unable to maintain grasp on cup using BUE for self drinking 2/2 uncontrollable jerking/tremor.      Pertinent Vitals/Pain Pain Assessment: Faces Faces Pain Scale: Hurts even more Pain Location: L hip pain with mobility Pain Descriptors / Indicators: Sharp Pain Intervention(s): Limited activity within patient's tolerance;Repositioned     Hand Dominance Right   Extremity/Trunk Assessment Upper Extremity Assessment Upper Extremity Assessment: Generalized weakness RUE Deficits / Details: good R hand grip strength; 4/5 shoulder flexion; 4/5 elbow flexion/extension LUE Deficits / Details: Good L hand grip strength; 4/5 shoulder flexion, 4/5 elbow flexion/extension   Lower Extremity Assessment Lower Extremity Assessment: Defer to PT evaluation RLE Deficits / Details: R DF 2-/5 (per PT eval about 2 weeks prior, DF 0-1/5); hip flexion 2+/5; knee flexion/extension 3/5 LLE Deficits / Details: able to wiggle toes; hip flexion unable to be tested d/t significant L hip pain with movement (even PROM); at least 2/5 knee flexion/extension (complicated d/t heavy L LE cast) LLE: Unable to fully assess due to immobilization   Cervical / Trunk Assessment Cervical / Trunk Assessment: Other exceptions Cervical / Trunk Exceptions: forward head/shoulders   Communication Communication Communication: No difficulties   Cognition Arousal/Alertness: Awake/alert Behavior During Therapy: WFL for tasks assessed/performed Overall Cognitive Status: Within Functional Limits for tasks assessed                                  General Comments: talkative, requires redirection     General Comments       Exercises Exercises: Other exercises Total Joint Exercises Heel Slides: AAROM;Strengthening;Right;10 reps;Supine Other Exercises Other Exercises: Pt/family eudcated re: OT role, DME recs, d/c recs, falls prevention, ECS Other Exercises: LBD, sup<>sit, lateral scoot, sitting balance/tolerance   Shoulder Instructions      Home Living Family/patient expects to be discharged to:: Skilled nursing facility Living Arrangements: Spouse/significant other Available Help at Discharge: Family Type of Home: House Home Access: Stairs to enter Secretary/administrator of Steps: 2 Entrance Stairs-Rails: Right Home Layout: One level     Bathroom Shower/Tub: Producer, television/film/video: Handicapped height     Home Equipment: Agricultural consultant (2 wheels);Cane - single point;Grab bars - tub/shower;Grab bars - toilet;Shower seat - built in          Prior Functioning/Environment Prior Level of Function : Independent/Modified Independent             Mobility Comments: Prior to fall in November 2022, pt was ambulating with RW.  Most recently requiring assist for w/c level transfers at rehab facility (NWB'ing R LE). ADLs Comments: Pt's wife does cooking and cleaning.  Pt was able to dress  self prior to November hospitalization.        OT Problem List: Decreased strength;Decreased activity tolerance;Impaired balance (sitting and/or standing);Decreased knowledge of precautions;Decreased knowledge of use of DME or AE;Decreased safety awareness      OT Treatment/Interventions: Self-care/ADL training;Therapeutic exercise;Therapeutic activities;Energy conservation;DME and/or AE instruction;Manual therapy;Balance training;Patient/family education    OT Goals(Current goals can be found in the care plan section) Acute Rehab OT Goals Patient Stated Goal: to return to  PLOF OT Goal Formulation: With patient/family Time For Goal Achievement: 09/23/21 Potential to Achieve Goals: Fair ADL Goals Pt Will Perform Grooming: with set-up;with supervision;sitting Pt Will Perform Lower Body Dressing: with min assist;sitting/lateral leans Pt Will Transfer to Toilet: with max assist;squat pivot transfer;bedside commode  OT Frequency: Min 2X/week   Barriers to D/C:            Co-evaluation PT/OT/SLP Co-Evaluation/Treatment: Yes Reason for Co-Treatment: Complexity of the patient's impairments (multi-system involvement);For patient/therapist safety;To address functional/ADL transfers PT goals addressed during session: Mobility/safety with mobility OT goals addressed during session: ADL's and self-care      AM-PAC OT "6 Clicks" Daily Activity     Outcome Measure Help from another person eating meals?: A Lot Help from another person taking care of personal grooming?: A Lot Help from another person toileting, which includes using toliet, bedpan, or urinal?: A Lot Help from another person bathing (including washing, rinsing, drying)?: A Lot Help from another person to put on and taking off regular upper body clothing?: A Lot Help from another person to put on and taking off regular lower body clothing?: A Lot 6 Click Score: 12   End of Session Nurse Communication: Mobility status  Activity Tolerance: Patient tolerated treatment well Patient left: in bed;with call bell/phone within reach;with bed alarm set;with family/visitor present  OT Visit Diagnosis: Unsteadiness on feet (R26.81);Repeated falls (R29.6);Muscle weakness (generalized) (M62.81)                Time: 1638-4536 OT Time Calculation (min): 19 min Charges:  OT General Charges $OT Visit: 1 Visit OT Evaluation $OT Eval Moderate Complexity: 1 Mod  Kathie Dike, M.S. OTR/L  09/09/21, 1:39 PM  ascom [phone]

## 2021-09-09 NOTE — Plan of Care (Signed)

## 2021-09-09 NOTE — NC FL2 (Signed)
[place] MEDICAID FL2 LEVEL OF CARE SCREENING TOOL     IDENTIFICATION  Patient Name: [name] Birthdate: 1941-08-09 Sex: male Admission Date (Current Location): 09/05/2021  [facility] and [place] Number:  Chiropodist and Address:  [facility], [address] [place], [place] [address]      Provider Number: [id number]  Attending Physician Name and Address:  Tresa Moore, MD  Relative Name and Phone Number:       Current Level of Care: Hospital Recommended Level of Care: Skilled Nursing Facility Prior Approval Number:    Date Approved/Denied:   PASRR Number: [id number]  Discharge Plan: SNF    Current Diagnoses: Patient Active Problem List   Diagnosis Date Noted   Complicated UTI (urinary tract infection) 09/07/2021   Dysuria 09/05/2021   Severe sepsis (HCC) 09/05/2021   Anemia 09/05/2021   AKI (acute kidney injury) (HCC) 09/05/2021   Elevated glucose 09/05/2021   Acute pyelonephritis 09/05/2021   Closed left ankle fracture 08/19/2021   Occult GI bleeding 08/16/2021   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/10/2020   History of cerebral hemorrhage 10/27/2019   Arthritis 10/21/2019   Hyperlipidemia 10/21/2019   Hypertension 10/21/2019   Seizures (HCC) 10/21/2019   Stroke (HCC) 10/21/2019   DVT (deep venous thrombosis) (HCC) 10/21/2019   Atrial fibrillation (HCC) 02/17/2019   Bilateral swelling of feet 01/23/2009   Swelling of both ankles 09/25/2001    Orientation RESPIRATION BLADDER Height & Weight     Self, Time, Situation, Place  Normal Continent Weight: 215 lb (97.5 kg) Height:  6' (182.9 cm)  BEHAVIORAL SYMPTOMS/MOOD NEUROLOGICAL BOWEL NUTRITION STATUS      Incontinent Diet (heart healthy, thin liquids)  AMBULATORY STATUS COMMUNICATION OF NEEDS Skin   Extensive Assist Verbally Other (Comment) (closed incision left foot, compression wrapped)                       Personal Care Assistance Level of  Assistance  Bathing, Feeding, Dressing Bathing Assistance: Maximum assistance Feeding assistance: Independent Dressing Assistance: Maximum assistance     Functional Limitations Info  Sight, Hearing, Speech Sight Info: Adequate Hearing Info: Adequate Speech Info: Adequate    SPECIAL CARE FACTORS FREQUENCY  PT (By licensed PT), OT (By licensed OT)     PT Frequency: 5x OT Frequency: 5x            Contractures Contractures Info: Not present    Additional Factors Info  Code Status, Allergies Code Status Info: Full code Allergies Info: Codeine, Fentanyl, Regadenoson, Verapamil, Acetaminophen           Current Medications (09/09/2021):  This is the current hospital active medication list Current Facility-Administered Medications  Medication Dose Route Frequency Provider Last Rate Last Admin   acetaminophen (TYLENOL) tablet 650 mg  650 mg Oral Q6H PRN Sreenath, Sudheer B, MD       albuterol (PROVENTIL) (2.5 MG/3ML) 0.083% nebulizer solution 3 mL  3 mL Inhalation Q4H PRN Georgeann Oppenheim, Sudheer B, MD   3 mL at 09/09/21 0908   apixaban (ELIQUIS) tablet 5 mg  5 mg Oral BID Rauer, Robyne Peers, RPH   5 mg at 09/09/21 0905   carbamazepine (TEGRETOL) tablet 200 mg  200 mg Oral BID Irena Cords V, MD   200 mg at 09/09/21 0904   ceFAZolin (ANCEF) IVPB 2g/100 mL premix  2 g Intravenous Q8H Sreenath, Sudheer B, MD 200 mL/hr at 09/09/21 1135 2 g at 09/09/21 1135   furosemide (  LASIX) tablet 40 mg  40 mg Oral Daily Georgeann Oppenheim, Sudheer B, MD   40 mg at 09/09/21 1152   irbesartan (AVAPRO) tablet 300 mg  300 mg Oral Daily Sreenath, Sudheer B, MD   300 mg at 09/09/21 1152   metoprolol tartrate (LOPRESSOR) injection 5 mg  5 mg Intravenous Once Gertha Calkin, MD       ondansetron (ZOFRAN-ODT) disintegrating tablet 4 mg  4 mg Oral Q6H PRN Kathrynn Running, MD   4 mg at 09/07/21 1248   oxybutynin (DITROPAN) tablet 5 mg  5 mg Oral Q8H PRN Sreenath, Sudheer B, MD       pantoprazole (PROTONIX) EC tablet 40 mg   40 mg Oral QHS Sreenath, Sudheer B, MD       tamsulosin (FLOMAX) capsule 0.4 mg  0.4 mg Oral QPC supper Georgeann Oppenheim, Sudheer B, MD   0.4 mg at 09/08/21 1710   vitamin B-12 (CYANOCOBALAMIN) tablet 1,000 mcg  1,000 mcg Oral Daily Lolita Patella B, MD   1,000 mcg at 09/09/21 1152     Discharge Medications: Please see discharge summary for a list of discharge medications.  Relevant Imaging Results:  Relevant Lab Results:   Additional Information SSN:[id number]  Reuel Boom Elize Pinon, LCSW

## 2021-09-10 ENCOUNTER — Inpatient Hospital Stay: Payer: Medicare Other

## 2021-09-10 LAB — BASIC METABOLIC PANEL
Anion gap: 7 (ref 5–15)
BUN: 24 mg/dL — ABNORMAL HIGH (ref 8–23)
CO2: 22 mmol/L (ref 22–32)
Calcium: 8.1 mg/dL — ABNORMAL LOW (ref 8.9–10.3)
Chloride: 102 mmol/L (ref 98–111)
Creatinine, Ser: 1.07 mg/dL (ref 0.61–1.24)
GFR, Estimated: >60 mL/min (ref >60)
Glucose, Bld: 161 mg/dL — ABNORMAL HIGH (ref 70–99)
Potassium: 3.2 mmol/L — ABNORMAL LOW (ref 3.5–5.1)
Sodium: 131 mmol/L — ABNORMAL LOW (ref 135–145)

## 2021-09-10 LAB — CBC WITH DIFFERENTIAL/PLATELET
Abs Immature Granulocytes: 0.48 10*3/uL — ABNORMAL HIGH (ref 0.00–0.07)
Basophils Absolute: 0 10*3/uL (ref 0.0–0.1)
Basophils Relative: 0 %
Eosinophils Absolute: 0.2 10*3/uL (ref 0.0–0.5)
Eosinophils Relative: 2 %
HCT: 32.9 % — ABNORMAL LOW (ref 39.0–52.0)
Hemoglobin: 10.3 g/dL — ABNORMAL LOW (ref 13.0–17.0)
Immature Granulocytes: 4 %
Lymphocytes Relative: 10 %
Lymphs Abs: 1.1 10*3/uL (ref 0.7–4.0)
MCH: 24.1 pg — ABNORMAL LOW (ref 26.0–34.0)
MCHC: 31.3 g/dL (ref 30.0–36.0)
MCV: 77 fL — ABNORMAL LOW (ref 80.0–100.0)
Monocytes Absolute: 0.7 10*3/uL (ref 0.1–1.0)
Monocytes Relative: 6 %
Neutro Abs: 8.5 10*3/uL — ABNORMAL HIGH (ref 1.7–7.7)
Neutrophils Relative %: 78 %
Platelets: 182 10*3/uL (ref 150–400)
RBC: 4.27 MIL/uL (ref 4.22–5.81)
RDW: 23.3 % — ABNORMAL HIGH (ref 11.5–15.5)
Smear Review: NORMAL
WBC: 10.9 10*3/uL — ABNORMAL HIGH (ref 4.0–10.5)
nRBC: 0.3 % — ABNORMAL HIGH (ref 0.0–0.2)

## 2021-09-10 LAB — CULTURE, BLOOD (ROUTINE X 2)
Culture: NO GROWTH
Special Requests: ADEQUATE

## 2021-09-10 LAB — MAGNESIUM: Magnesium: 1.8 mg/dL (ref 1.7–2.4)

## 2021-09-10 MED ORDER — PANTOPRAZOLE SODIUM 40 MG PO TBEC
40.0000 mg | DELAYED_RELEASE_TABLET | Freq: Two times a day (BID) | ORAL | Status: DC
  Administered 2021-09-10 – 2021-09-13 (×8): 40 mg via ORAL
  Filled 2021-09-10 (×8): qty 1

## 2021-09-10 MED ORDER — POTASSIUM CHLORIDE CRYS ER 20 MEQ PO TBCR
40.0000 meq | EXTENDED_RELEASE_TABLET | Freq: Once | ORAL | Status: AC
  Administered 2021-09-10: 40 meq via ORAL
  Filled 2021-09-10: qty 2

## 2021-09-10 MED ORDER — HYDROXYZINE HCL 25 MG PO TABS
25.0000 mg | ORAL_TABLET | Freq: Three times a day (TID) | ORAL | Status: DC | PRN
  Administered 2021-09-10 (×2): 25 mg via ORAL
  Filled 2021-09-10 (×2): qty 1

## 2021-09-10 MED ORDER — HYDRALAZINE HCL 20 MG/ML IJ SOLN
10.0000 mg | INTRAMUSCULAR | Status: DC | PRN
  Administered 2021-09-10: 10 mg via INTRAVENOUS
  Filled 2021-09-10: qty 1

## 2021-09-10 MED ORDER — ENSURE ENLIVE PO LIQD
237.0000 mL | Freq: Two times a day (BID) | ORAL | Status: DC
Start: 2021-09-10 — End: 2021-09-14
  Administered 2021-09-10 – 2021-09-13 (×7): 237 mL via ORAL

## 2021-09-10 NOTE — TOC Progression Note (Signed)
Transition of Care [facility]) - Progression Note    Patient Details  Name: [name] MRN: [id number] Date of Birth: Aug 11, 1941  Transition of Care [facility]) CM/SW Contact  Margarito Liner, LCSW Phone Number: 09/10/2021, 8:31 AM  Clinical Narrative:   No bed offers this morning.  Expected Discharge Plan: Skilled Nursing Facility Barriers to Discharge: Continued Medical Work up  Expected Discharge Plan and Services Expected Discharge Plan: Skilled Nursing Facility In-house Referral: NA   Post Acute Care Choice: Skilled Nursing Facility Living arrangements for the past 2 months: Skilled Nursing Facility                                       Social Determinants of Health (SDOH) Interventions    Readmission Risk Interventions No flowsheet data found.

## 2021-09-10 NOTE — Progress Notes (Signed)
PROGRESS NOTE    [name]  NGE:[id number] DOB: 11-08-1940 DOA: 09/05/2021 PCP: [name], MD    Brief Narrative:  80 y.o. male seen in ed with complaints of  Dysuria fever and chills since Friday evening.Pt is not able to start a stream.  Pt also reports diarrhea. Incomplete emptying.  Patient has dementia and is able to give [place] history but briefly.  And was seen about a week ago for acute urinary retention and had a Foley placed and was discharged with urology follow-up which he has yet to do as the appointment is upcoming.  Currently patient denies any headaches blurred vision chest pain shortness of breath nausea vomiting.  Patient states that he has had an intracranial hemorrhage and had to have neurosurgery at [facility].  He is on Eliquis currently and has been cleared through his cardiologist.  The following morning patient states that he has some pain in the hip and left leg status post fracture.  No specific complaints of abdominal pain on my evaluation however per son at bedside patient had excruciating abdominal pain prior to presentation.  Blood cultures now positive for E. Coli  Patient hemodynamics have been improving.  Abdominal pain and orthopedic pain also improved.  Pain regimen has been difficult to achieve as patient's mental status is readily affected.  Narcotics and gabapentin seem to have deleterious effects on his mentation.   Assessment & Plan:   Principal Problem:   Dysuria Active Problems:   Atrial fibrillation (HCC)   Hypertension   Seizures (HCC)   Stroke (HCC)   DVT (deep venous thrombosis) (HCC)   History of cerebral hemorrhage   Occult GI bleeding   Severe sepsis (HCC)   Anemia   AKI (acute kidney injury) ([place])   Elevated glucose   Acute pyelonephritis   Complicated UTI (urinary tract infection)   Complicated urinary tract infection E. coli bacteremia Urinary retention Severe sepsis secondary to above Retention followed hospitalization  and subsequent rehab stay status post left leg fracture Severe sepsis met by heart rate, temperature, elevated lactic acid CT abdomen with cystitis and possible pyelonephritis No stone or abscess Hemodynamics reassuring E. coli pansensitive Plan: IV Ancef per sensitivities No IV fluids Continue Foley catheter Continue daily Flomax will consider voiding trial tomorrow 12/18 outpatient cardiology follow-up Consider voiding trial in the next 24 hours  Demand ischemia/elevated troponins ACS ruled out No changes on EKG, no chest pain Troponin peaked at 624, likely secondary to sepsis TTE: EF 55-60 with no WMA Plan: DC telemetry Outpatient cardiology follow-up  Acute on chronic anemia No indication for transfusion currently  Left hip pain Acute on chronic, suspect referred pain from left ankle fracture Plain film x-ray negative for fracture Does not respond well to pain medication.  Will discontinue all sedating pain medications and proceed with Tylenol only  Left ankle fracture Status post operative repair.  Now in cast.  Coming from SNF Issues urinary retention started after hospitalization and no surgery Therapy evaluations as able  Paroxysmal atrial fibrillation Continue home Eliquis  Seizure disorder Per patient no seizures since late 80s Continue home Tegretol  Essential hypertension Home antihypertensives on hold in setting of sepsis   DVT prophylaxis: Eliquis Code Status: Full Family Communication: Spouse and son at bedside 12/14, 12/15, spouse at bedside 12/16, spouse at bedside 12/17 Disposition Plan: Status is: Inpatient  Remains inpatient appropriate because: Severe sepsis, E. coli bacteremia in the setting of urinary retention, complicated UTI.  Mentation appears to be improving.  Will symptomatically optimize for discharge in 24 to 48 hours      Level of care: Med-Surg  Consultants:  None  Procedures:   None  Antimicrobials: Ancef   Subjective: patient seen and examined.  No acute distress.  Does endorse some anxiety.  Wife at bedside  Objective: Vitals:   09/09/21 2011 09/10/21 0628 09/10/21 0759 09/10/21 1111  BP: (!) 146/90 (!) 182/92 (!) 173/90 115/68  Pulse: 83 84 83 76  Resp: _0 Temp: 98.2 F (36.8 C) (!) 97.3 F (36.3 C) 97.8 F (36.6 C)   TempSrc:  Oral Oral   SpO2: 96% 99% 94% 95%  Weight:      Height:        Intake/Output Summary (Last 24 hours) at 09/10/2021 1137 Last data filed at 09/10/2021 0800 Gross per 24 hour  Intake 100 ml  Output 2900 ml  Net -2800 ml   Filed Weights   09/05/21 1518  Weight: 97.5 kg    Examination:  General exam: No acute distress.  Sitting up in bed Respiratory system: Clear lungs.  Normal work of breathing.  Room air Cardiovascular system: S1-S2, RRR, no murmurs, no pedal edema Gastrointestinal system: Soft, NT/ND, normal bowel sounds Central nervous system: Alert and oriented. No focal neurological deficits. Extremities: LLE in surgical cast Skin: No rashes, lesions or ulcers Psychiatry: Judgement and insight appear impaired. Mood & affect flattened.     Data Reviewed: I have personally reviewed following labs and imaging studies  CBC: Recent Labs  Lab 09/05/21 1521 09/06/21 0225 09/06/21 1401 09/07/21 0701 09/08/21 0249 09/09/21 0753 09/10/21 1013  WBC 26.0*   < > 19.7* 19.1* 13.4* 10.7* 10.9*  NEUTROABS 23.6*  --   --   --   --  8.1* 8.5*  HGB 10.6*   < > 8.9* 8.9* 8.9* 9.4* 10.3*  HCT 35.7*   < > 28.0* 28.9* 28.9* 31.0* 32.9*  MCV 77.3*   < > 77.6* 78.1* 78.3* 79.1* 77.0*  PLT 268   < > 152 150 158 159 182   < > = values in this interval not displayed.   Basic Metabolic Panel: Recent Labs  Lab 09/05/21 2344 09/06/21 0707 09/06/21 1401 09/07/21 0701 09/09/21 0753 09/10/21 1013  NA 132* 131* 132*  --  134* 131*  K 3.3* 3.9 3.7  --  4.6 3.2*  CL 100 100 103  --  107 102  CO2 _1 --  23 22  GLUCOSE 115* 152* 103*  --  110* 161*  BUN 27* 29* 29*  --  31* 24*  CREATININE 1.23 1.26* 1.14 1.14 1.00 1.07  CALCIUM 8.4* 7.8* 7.8*  --  8.5* 8.1*  MG 1.6* 2.3  --   --   --  1.8  PHOS 1.5* 5.0*  --   --   --   --    GFR: Estimated Creatinine Clearance: 65.6 mL/min (by C-G formula based on SCr of 1.07 mg/dL). Liver Function Tests: Recent Labs  Lab 09/05/21 1521 09/05/21 2344 09/06/21 0707  AST 30 30 33  ALT _2 ALKPHOS 140* 120 99  BILITOT 1.1 1.3* 1.1  PROT 7.3 5.6* 5.2*  ALBUMIN 3.4* 2.7* 2.4*   Recent Labs  Lab 09/05/21 1536  LIPASE 25   No results for input(s): AMMONIA in the last 168 hours. Coagulation Profile: Recent Labs  Lab 09/05/21 1521 09/06/21 0225  INR 1.7* 2.2*   Cardiac Enzymes: No results  for input(s): CKTOTAL, CKMB, CKMBINDEX, TROPONINI in the last 168 hours. BNP (last 3 results) No results for input(s): PROBNP in the last 8760 hours. HbA1C: No results for input(s): HGBA1C in the last 72 hours.  CBG: Recent Labs  Lab 09/06/21 0230  GLUCAP 118*   Lipid Profile: No results for input(s): CHOL, HDL, LDLCALC, TRIG, CHOLHDL, LDLDIRECT in the last 72 hours. Thyroid Function Tests: No results for input(s): TSH, T4TOTAL, FREET4, T3FREE, THYROIDAB in the last 72 hours.  Anemia Panel: No results for input(s): VITAMINB12, FOLATE, FERRITIN, TIBC, IRON, RETICCTPCT in the last 72 hours.  Sepsis Labs: Recent Labs  Lab 09/05/21 1528 09/05/21 1536 09/05/21 2121 09/05/21 2344 09/06/21 0707 09/06/21 1401  PROCALCITON  --  26.01  --   --  42.04  --   LATICACIDVEN 3.1*  --  6.4* 3.5*  --  1.4    Recent Results (from the past 240 hour(s))  Culture, blood (Routine x 2)     Status: Abnormal   Collection Time: 09/05/21  3:20 PM   Specimen: BLOOD  Result Value Ref Range Status   Specimen Description   Final    BLOOD LEFT ANTECUBITAL Performed at [facility], [address] [place], [place] [address]    Special  Requests   Final    BOTTLES DRAWN AEROBIC AND ANAEROBIC Blood Culture adequate volume Performed at [facility], [address] [place], [place] [address]    Culture  Setup Time   Final    GRAM NEGATIVE RODS ANAEROBIC BOTTLE ONLY CRITICAL RESULT CALLED TO, READ BACK BY AND VERIFIED WITH: Virl Cagey _0  on 09/06/21 skl    Culture ESCHERICHIA COLI (A)  Final   Report Status 09/09/2021 FINAL  Final   Organism ID, Bacteria ESCHERICHIA COLI  Final      Susceptibility   Escherichia coli - MIC*    AMPICILLIN 4 SENSITIVE Sensitive     CEFAZOLIN <=4 SENSITIVE Sensitive     CEFEPIME <=0.12 SENSITIVE Sensitive     CEFTAZIDIME <=1 SENSITIVE Sensitive     CEFTRIAXONE <=0.25 SENSITIVE Sensitive     CIPROFLOXACIN <=0.25 SENSITIVE Sensitive     GENTAMICIN <=1 SENSITIVE Sensitive     IMIPENEM <=0.25 SENSITIVE Sensitive     TRIMETH/SULFA <=20 SENSITIVE Sensitive     AMPICILLIN/SULBACTAM <=2 SENSITIVE Sensitive     PIP/TAZO <=4 SENSITIVE Sensitive     * ESCHERICHIA COLI  Culture, blood (Routine x 2)     Status: None   Collection Time: 09/05/21  3:20 PM   Specimen: BLOOD  Result Value Ref Range Status   Specimen Description BLOOD BLOOD LEFT ARM  Final   Special Requests   Final    BOTTLES DRAWN AEROBIC AND ANAEROBIC Blood Culture adequate volume   Culture   Final    NO GROWTH 5 DAYS Performed at [facility], [address] [place], [place] [address]    Report Status 09/10/2021 FINAL  Final  Blood Culture ID Panel (Reflexed)     Status: Abnormal   Collection Time: 09/05/21  3:20 PM  Result Value Ref Range Status   Enterococcus faecalis NOT DETECTED NOT DETECTED Final   Enterococcus Faecium NOT DETECTED NOT DETECTED Final   Listeria monocytogenes NOT DETECTED NOT DETECTED Final   Staphylococcus species NOT DETECTED NOT DETECTED Final   Staphylococcus aureus (BCID) NOT DETECTED NOT DETECTED Final   Staphylococcus epidermidis NOT DETECTED NOT DETECTED Final    Staphylococcus lugdunensis NOT DETECTED NOT DETECTED Final   Streptococcus  species NOT DETECTED NOT DETECTED Final   Streptococcus agalactiae NOT DETECTED NOT DETECTED Final   Streptococcus pneumoniae NOT DETECTED NOT DETECTED Final   Streptococcus pyogenes NOT DETECTED NOT DETECTED Final   A.calcoaceticus-baumannii NOT DETECTED NOT DETECTED Final   Bacteroides fragilis NOT DETECTED NOT DETECTED Final   Enterobacterales DETECTED (A) NOT DETECTED Final    Comment: Enterobacterales represent a large order of gram negative bacteria, not a single organism. CRITICAL RESULT CALLED TO, READ BACK BY AND VERIFIED WITH: Virl Cagey _0  on 09/06/21 skl    Enterobacter cloacae complex NOT DETECTED NOT DETECTED Final   Escherichia coli DETECTED (A) NOT DETECTED Final    Comment: CRITICAL RESULT CALLED TO, READ BACK BY AND VERIFIED WITH: Virl Cagey _1  on 09/06/21 skl    Klebsiella aerogenes NOT DETECTED NOT DETECTED Final   Klebsiella oxytoca NOT DETECTED NOT DETECTED Final   Klebsiella pneumoniae NOT DETECTED NOT DETECTED Final   Proteus species NOT DETECTED NOT DETECTED Final   Salmonella species NOT DETECTED NOT DETECTED Final   Serratia marcescens NOT DETECTED NOT DETECTED Final   Haemophilus influenzae NOT DETECTED NOT DETECTED Final   Neisseria meningitidis NOT DETECTED NOT DETECTED Final   Pseudomonas aeruginosa NOT DETECTED NOT DETECTED Final   Stenotrophomonas maltophilia NOT DETECTED NOT DETECTED Final   Candida albicans NOT DETECTED NOT DETECTED Final   Candida auris NOT DETECTED NOT DETECTED Final   Candida glabrata NOT DETECTED NOT DETECTED Final   Candida krusei NOT DETECTED NOT DETECTED Final   Candida parapsilosis NOT DETECTED NOT DETECTED Final   Candida tropicalis NOT DETECTED NOT DETECTED Final   Cryptococcus neoformans/gattii NOT DETECTED NOT DETECTED Final   CTX-M ESBL NOT DETECTED NOT DETECTED Final   Carbapenem resistance IMP NOT DETECTED NOT DETECTED Final    Carbapenem resistance KPC NOT DETECTED NOT DETECTED Final   Carbapenem resistance NDM NOT DETECTED NOT DETECTED Final   Carbapenem resist OXA 48 LIKE NOT DETECTED NOT DETECTED Final   Carbapenem resistance VIM NOT DETECTED NOT DETECTED Final    Comment: Performed at [facility], [address] [place], [place] [address]  Resp Panel by RT-PCR (Flu A&B, Covid) Nasopharyngeal Swab     Status: None   Collection Time: 09/05/21  3:21 PM   Specimen: Nasopharyngeal Swab; Nasopharyngeal(NP) swabs in vial transport medium  Result Value Ref Range Status   SARS Coronavirus 2 by RT PCR NEGATIVE NEGATIVE Final    Comment: (NOTE) SARS-CoV-2 target nucleic acids are NOT DETECTED.  The SARS-CoV-2 RNA is generally detectable in upper respiratory specimens during the acute phase of infection. The lowest concentration of SARS-CoV-2 viral copies this assay can detect is 138 copies/mL. A negative result does not preclude SARS-Cov-2 infection and should not be used as the sole basis for treatment or other patient management decisions. A negative result may occur with  improper specimen collection/handling, submission of specimen other than nasopharyngeal swab, presence of viral mutation(s) within the areas targeted by this assay, and inadequate number of viral copies(<138 copies/mL). A negative result must be combined with clinical observations, patient history, and epidemiological information. The expected result is Negative.  Fact Sheet for Patients:  EntrepreneurPulse.com.au  Fact Sheet for Healthcare Providers:  IncredibleEmployment.be  This test is no t yet approved or cleared by the [place] FDA and  has been authorized for detection and/or diagnosis of SARS-CoV-2 by FDA under an Emergency Use Authorization (EUA). This EUA will remain  in effect (meaning this test can be used) for the  duration of the COVID-19 declaration under Section 564(b)(1)  of the Act, 21 U.S.C.section 360bbb-3(b)(1), unless the authorization is terminated  or revoked sooner.       Influenza A by PCR NEGATIVE NEGATIVE Final   Influenza B by PCR NEGATIVE NEGATIVE Final    Comment: (NOTE) The Xpert Xpress SARS-CoV-2/FLU/RSV plus assay is intended as an aid in the diagnosis of influenza from Nasopharyngeal swab specimens and should not be used as a sole basis for treatment. Nasal washings and aspirates are unacceptable for Xpert Xpress SARS-CoV-2/FLU/RSV testing.  Fact Sheet for Patients: EntrepreneurPulse.com.au  Fact Sheet for Healthcare Providers: IncredibleEmployment.be  This test is not yet approved or cleared by the [place] FDA and has been authorized for detection and/or diagnosis of SARS-CoV-2 by FDA under an Emergency Use Authorization (EUA). This EUA will remain in effect (meaning this test can be used) for the duration of the COVID-19 declaration under Section 564(b)(1) of the Act, 21 U.S.C. section 360bbb-3(b)(1), unless the authorization is terminated or revoked.  Performed at [facility], [address] [place], [place] [address]   Urine Culture     Status: None   Collection Time: 09/06/21  2:25 AM   Specimen: Urine, Random  Result Value Ref Range Status   Specimen Description   Final    URINE, RANDOM Performed at [facility], [address], [place], [place] [address]    Special Requests   Final    NONE Performed at [facility], [address], [place], [place] [address]    Culture   Final    NO GROWTH Performed at [facility], [place] [address] [place], [place] [address]    Report Status 09/07/2021 FINAL  Final         Radiology Studies: DG Chest Port 1 View  Result Date: 09/10/2021 CLINICAL DATA:  Chest pain EXAM: PORTABLE CHEST 1 VIEW COMPARISON:  Chest x-ray 09/06/2021 FINDINGS: Heart is enlarged. Mediastinum appears stable.  Pulmonary vasculature is within normal limits. No focal consolidation identified. No pleural effusion or pneumothorax visualized. IMPRESSION: Cardiomegaly with no acute process identified. Electronically Signed   By: Ofilia Neas M.D.   On: 09/10/2021 09:41        Scheduled Meds:  apixaban  5 mg Oral BID   carbamazepine  200 mg Oral BID   Chlorhexidine Gluconate Cloth  6 each Topical Daily   feeding supplement  237 mL Oral BID BM   furosemide  40 mg Oral Daily   irbesartan  300 mg Oral Daily   metoprolol tartrate  5 mg Intravenous Once   pantoprazole  40 mg Oral BID   potassium chloride  40 mEq Oral Once   tamsulosin  0.4 mg Oral QPC supper   vitamin B-12  1,000 mcg Oral Daily   Continuous Infusions:   ceFAZolin (ANCEF) IV 2 g (09/10/21 0628)     LOS: 5 days    Time spent: 25 minutes    Sidney Ace, MD [facility]   If 7PM-7AM, please contact night-coverage  09/10/2021, 11:37 AM

## 2021-09-11 NOTE — Progress Notes (Signed)
PROGRESS NOTE    [name]  HUT:[id number] DOB: 27-Oct-1940 DOA: 09/05/2021 PCP: [name], MD    Brief Narrative:  80 y.o. male seen in ed with complaints of  Dysuria fever and chills since Friday evening.Pt is not able to start a stream.  Pt also reports diarrhea. Incomplete emptying.  Patient has dementia and is able to give [place] history but briefly.  And was seen about a week ago for acute urinary retention and had a Foley placed and was discharged with urology follow-up which he has yet to do as the appointment is upcoming.  Currently patient denies any headaches blurred vision chest pain shortness of breath nausea vomiting.  Patient states that he has had an intracranial hemorrhage and had to have neurosurgery at [facility].  He is on Eliquis currently and has been cleared through his cardiologist.  The following morning patient states that he has some pain in the hip and left leg status post fracture.  No specific complaints of abdominal pain on my evaluation however per son at bedside patient had excruciating abdominal pain prior to presentation.  Blood cultures now positive for E. Coli  Patient hemodynamics have been improving.  Abdominal pain and orthopedic pain also improved.  Pain regimen has been difficult to achieve as patient's mental status is readily affected.  Narcotics and gabapentin seem to have deleterious effects on his mentation.   Assessment & Plan:   Principal Problem:   Dysuria Active Problems:   Atrial fibrillation (HCC)   Hypertension   Seizures (HCC)   Stroke (HCC)   DVT (deep venous thrombosis) (HCC)   History of cerebral hemorrhage   Occult GI bleeding   Severe sepsis (HCC)   Anemia   AKI (acute kidney injury) ([place])   Elevated glucose   Acute pyelonephritis   Complicated UTI (urinary tract infection)   Complicated urinary tract infection E. coli bacteremia Urinary retention Severe sepsis secondary to above Retention followed hospitalization  and subsequent rehab stay status post left leg fracture Severe sepsis met by heart rate, temperature, elevated lactic acid CT abdomen with cystitis and possible pyelonephritis No stone or abscess Hemodynamics reassuring E. coli pansensitive Plan: Continue IV Ancef No IV fluids Proceed with voiding trial today DC Foley catheter Bladder scans Q 6 hours Straight cath as needed bladder volume greater than 350 cc  continue daily Flomax  Demand ischemia/elevated troponins ACS ruled out No changes on EKG, no chest pain Troponin peaked at 624, likely secondary to sepsis TTE: EF 55-60 with no WMA Plan: DC telemetry Outpatient cardiology follow-up  Acute on chronic anemia No indication for transfusion currently  Left hip pain Acute on chronic, suspect referred pain from left ankle fracture Plain film x-ray negative for fracture Does not respond well to pain medication.   Pain control improved  Left ankle fracture Status post operative repair.  Now in cast.  Coming from SNF Issues urinary retention started after hospitalization and no surgery Therapy evaluations as able DC Foley catheter  Paroxysmal atrial fibrillation Continue home Eliquis  Seizure disorder Per patient no seizures since late 80s Continue home Tegretol  Essential hypertension Home antihypertensives on hold in setting of sepsis   DVT prophylaxis: Eliquis Code Status: Full Family Communication: Spouse and son at bedside 12/14, 12/15, spouse at bedside 12/16, spouse at bedside 12/17, spouse at bedside 12/18 Disposition Plan: Status is: Inpatient  Remains inpatient appropriate because: Severe sepsis, E. coli bacteremia in the setting of urinary retention, complicated UTI.  Mentation appears  to be improving.  DC Foley catheter.  Optimized for discharge in the next 24 hours      Level of care: Med-Surg  Consultants:  None  Procedures:  None  Antimicrobials: Ancef   Subjective: Patient seen and  examined.  No acute distress.  More communicative this morning.  Wife at bedside.  Objective: Vitals:   09/10/21 1558 09/10/21 1929 09/11/21 0445 09/11/21 0742  BP: 130/69 (!) 155/98 (!) 159/83 (!) 170/81  Pulse: 72 74 82 80  Resp: $Remo'16 20 18 20  'oUrEG$ Temp: 97.7 F (36.5 C) 99.8 F (37.7 C) 98.4 F (36.9 C) 97.8 F (36.6 C)  TempSrc:  Oral Oral   SpO2: 98% 99% 98% 97%  Weight:      Height:        Intake/Output Summary (Last 24 hours) at 09/11/2021 1144 Last data filed at 09/11/2021 0700 Gross per 24 hour  Intake 560 ml  Output 4500 ml  Net -3940 ml   Filed Weights   09/05/21 1518  Weight: 97.5 kg    Examination:  General exam: No acute distress.  Sitting up in bed.  More alert and communicative this morning Respiratory system: Clear lungs.  Normal work of breathing.  Room air Cardiovascular system: S1-S2, RRR, no murmurs, no pedal edema Gastrointestinal system: Soft, NT/ND, normal bowel sounds Central nervous system: Alert, oriented x2, no focal deficits Extremities: LLE in surgical cast Skin: No rashes, lesions or ulcers Psychiatry: Judgement and insight appear impaired. Mood & affect flattened.     Data Reviewed: I have personally reviewed following labs and imaging studies  CBC: Recent Labs  Lab 09/05/21 1521 09/06/21 0225 09/06/21 1401 09/07/21 0701 09/08/21 0249 09/09/21 0753 09/10/21 1013  WBC 26.0*   < > 19.7* 19.1* 13.4* 10.7* 10.9*  NEUTROABS 23.6*  --   --   --   --  8.1* 8.5*  HGB 10.6*   < > 8.9* 8.9* 8.9* 9.4* 10.3*  HCT 35.7*   < > 28.0* 28.9* 28.9* 31.0* 32.9*  MCV 77.3*   < > 77.6* 78.1* 78.3* 79.1* 77.0*  PLT 268   < > 152 150 158 159 182   < > = values in this interval not displayed.   Basic Metabolic Panel: Recent Labs  Lab 09/05/21 2344 09/06/21 0707 09/06/21 1401 09/07/21 0701 09/09/21 0753 09/10/21 1013  NA 132* 131* 132*  --  134* 131*  K 3.3* 3.9 3.7  --  4.6 3.2*  CL 100 100 103  --  107 102  CO2 $Re'25 26 25  'zct$ --  23 22   GLUCOSE 115* 152* 103*  --  110* 161*  BUN 27* 29* 29*  --  31* 24*  CREATININE 1.23 1.26* 1.14 1.14 1.00 1.07  CALCIUM 8.4* 7.8* 7.8*  --  8.5* 8.1*  MG 1.6* 2.3  --   --   --  1.8  PHOS 1.5* 5.0*  --   --   --   --    GFR: Estimated Creatinine Clearance: 65.6 mL/min (by C-G formula based on SCr of 1.07 mg/dL). Liver Function Tests: Recent Labs  Lab 09/05/21 1521 09/05/21 2344 09/06/21 0707  AST 30 30 33  ALT $Re'19 17 17  'xxW$ ALKPHOS 140* 120 99  BILITOT 1.1 1.3* 1.1  PROT 7.3 5.6* 5.2*  ALBUMIN 3.4* 2.7* 2.4*   Recent Labs  Lab 09/05/21 1536  LIPASE 25   No results for input(s): AMMONIA in the last 168 hours. Coagulation Profile: Recent Labs  Lab  09/05/21 1521 09/06/21 0225  INR 1.7* 2.2*   Cardiac Enzymes: No results for input(s): CKTOTAL, CKMB, CKMBINDEX, TROPONINI in the last 168 hours. BNP (last 3 results) No results for input(s): PROBNP in the last 8760 hours. HbA1C: No results for input(s): HGBA1C in the last 72 hours.  CBG: Recent Labs  Lab 09/06/21 0230  GLUCAP 118*   Lipid Profile: No results for input(s): CHOL, HDL, LDLCALC, TRIG, CHOLHDL, LDLDIRECT in the last 72 hours. Thyroid Function Tests: No results for input(s): TSH, T4TOTAL, FREET4, T3FREE, THYROIDAB in the last 72 hours.  Anemia Panel: No results for input(s): VITAMINB12, FOLATE, FERRITIN, TIBC, IRON, RETICCTPCT in the last 72 hours.  Sepsis Labs: Recent Labs  Lab 09/05/21 1528 09/05/21 1536 09/05/21 2121 09/05/21 2344 09/06/21 0707 09/06/21 1401  PROCALCITON  --  26.01  --   --  42.04  --   LATICACIDVEN 3.1*  --  6.4* 3.5*  --  1.4    Recent Results (from the past 240 hour(s))  Culture, blood (Routine x 2)     Status: Abnormal   Collection Time: 09/05/21  3:20 PM   Specimen: BLOOD  Result Value Ref Range Status   Specimen Description   Final    BLOOD LEFT ANTECUBITAL Performed at [facility], [address] [place], [place] [address]    Special Requests   Final     BOTTLES DRAWN AEROBIC AND ANAEROBIC Blood Culture adequate volume Performed at [facility], [address], [place], [place] [address]    Culture  Setup Time   Final    GRAM NEGATIVE RODS ANAEROBIC BOTTLE ONLY CRITICAL RESULT CALLED TO, READ BACK BY AND VERIFIED WITH: Virl Cagey $RemoveBe'@2146'[email or web address]$  on 09/06/21 skl    Culture ESCHERICHIA COLI (A)  Final   Report Status 09/09/2021 FINAL  Final   Organism ID, Bacteria ESCHERICHIA COLI  Final      Susceptibility   Escherichia coli - MIC*    AMPICILLIN 4 SENSITIVE Sensitive     CEFAZOLIN <=4 SENSITIVE Sensitive     CEFEPIME <=0.12 SENSITIVE Sensitive     CEFTAZIDIME <=1 SENSITIVE Sensitive     CEFTRIAXONE <=0.25 SENSITIVE Sensitive     CIPROFLOXACIN <=0.25 SENSITIVE Sensitive     GENTAMICIN <=1 SENSITIVE Sensitive     IMIPENEM <=0.25 SENSITIVE Sensitive     TRIMETH/SULFA <=20 SENSITIVE Sensitive     AMPICILLIN/SULBACTAM <=2 SENSITIVE Sensitive     PIP/TAZO <=4 SENSITIVE Sensitive     * ESCHERICHIA COLI  Culture, blood (Routine x 2)     Status: None   Collection Time: 09/05/21  3:20 PM   Specimen: BLOOD  Result Value Ref Range Status   Specimen Description BLOOD BLOOD LEFT ARM  Final   Special Requests   Final    BOTTLES DRAWN AEROBIC AND ANAEROBIC Blood Culture adequate volume   Culture   Final    NO GROWTH 5 DAYS Performed at [facility], [address] [place], [place] [address]    Report Status 09/10/2021 FINAL  Final  Blood Culture ID Panel (Reflexed)     Status: Abnormal   Collection Time: 09/05/21  3:20 PM  Result Value Ref Range Status   Enterococcus faecalis NOT DETECTED NOT DETECTED Final   Enterococcus Faecium NOT DETECTED NOT DETECTED Final   Listeria monocytogenes NOT DETECTED NOT DETECTED Final   Staphylococcus species NOT DETECTED NOT DETECTED Final   Staphylococcus aureus (BCID) NOT DETECTED NOT DETECTED Final   Staphylococcus epidermidis NOT DETECTED NOT DETECTED  Final   Staphylococcus  lugdunensis NOT DETECTED NOT DETECTED Final   Streptococcus species NOT DETECTED NOT DETECTED Final   Streptococcus agalactiae NOT DETECTED NOT DETECTED Final   Streptococcus pneumoniae NOT DETECTED NOT DETECTED Final   Streptococcus pyogenes NOT DETECTED NOT DETECTED Final   A.calcoaceticus-baumannii NOT DETECTED NOT DETECTED Final   Bacteroides fragilis NOT DETECTED NOT DETECTED Final   Enterobacterales DETECTED (A) NOT DETECTED Final    Comment: Enterobacterales represent a large order of gram negative bacteria, not a single organism. CRITICAL RESULT CALLED TO, READ BACK BY AND VERIFIED WITH: Virl Cagey $RemoveBe'@2146'[id number]$  on 09/06/21 skl    Enterobacter cloacae complex NOT DETECTED NOT DETECTED Final   Escherichia coli DETECTED (A) NOT DETECTED Final    Comment: CRITICAL RESULT CALLED TO, READ BACK BY AND VERIFIED WITH: Virl Cagey $RemoveBe'@2146'[email or web address]$  on 09/06/21 skl    Klebsiella aerogenes NOT DETECTED NOT DETECTED Final   Klebsiella oxytoca NOT DETECTED NOT DETECTED Final   Klebsiella pneumoniae NOT DETECTED NOT DETECTED Final   Proteus species NOT DETECTED NOT DETECTED Final   Salmonella species NOT DETECTED NOT DETECTED Final   Serratia marcescens NOT DETECTED NOT DETECTED Final   Haemophilus influenzae NOT DETECTED NOT DETECTED Final   Neisseria meningitidis NOT DETECTED NOT DETECTED Final   Pseudomonas aeruginosa NOT DETECTED NOT DETECTED Final   Stenotrophomonas maltophilia NOT DETECTED NOT DETECTED Final   Candida albicans NOT DETECTED NOT DETECTED Final   Candida auris NOT DETECTED NOT DETECTED Final   Candida glabrata NOT DETECTED NOT DETECTED Final   Candida krusei NOT DETECTED NOT DETECTED Final   Candida parapsilosis NOT DETECTED NOT DETECTED Final   Candida tropicalis NOT DETECTED NOT DETECTED Final   Cryptococcus neoformans/gattii NOT DETECTED NOT DETECTED Final   CTX-M ESBL NOT DETECTED NOT DETECTED Final   Carbapenem resistance IMP NOT DETECTED NOT DETECTED Final   Carbapenem  resistance KPC NOT DETECTED NOT DETECTED Final   Carbapenem resistance NDM NOT DETECTED NOT DETECTED Final   Carbapenem resist OXA 48 LIKE NOT DETECTED NOT DETECTED Final   Carbapenem resistance VIM NOT DETECTED NOT DETECTED Final    Comment: Performed at [facility], [address] [place], [place] [address]  Resp Panel by RT-PCR (Flu A&B, Covid) Nasopharyngeal Swab     Status: None   Collection Time: 09/05/21  3:21 PM   Specimen: Nasopharyngeal Swab; Nasopharyngeal(NP) swabs in vial transport medium  Result Value Ref Range Status   SARS Coronavirus 2 by RT PCR NEGATIVE NEGATIVE Final    Comment: (NOTE) SARS-CoV-2 target nucleic acids are NOT DETECTED.  The SARS-CoV-2 RNA is generally detectable in upper respiratory specimens during the acute phase of infection. The lowest concentration of SARS-CoV-2 viral copies this assay can detect is 138 copies/mL. A negative result does not preclude SARS-Cov-2 infection and should not be used as the sole basis for treatment or other patient management decisions. A negative result may occur with  improper specimen collection/handling, submission of specimen other than nasopharyngeal swab, presence of viral mutation(s) within the areas targeted by this assay, and inadequate number of viral copies(<138 copies/mL). A negative result must be combined with clinical observations, patient history, and epidemiological information. The expected result is Negative.  Fact Sheet for Patients:  EntrepreneurPulse.com.au  Fact Sheet for Healthcare Providers:  IncredibleEmployment.be  This test is no t yet approved or cleared by the [place] FDA and  has been authorized for detection and/or diagnosis of SARS-CoV-2 by FDA under an Emergency Use Authorization (EUA). This  EUA will remain  in effect (meaning this test can be used) for the duration of the COVID-19 declaration under Section 564(b)(1) of the Act,  21 U.S.C.section 360bbb-3(b)(1), unless the authorization is terminated  or revoked sooner.       Influenza A by PCR NEGATIVE NEGATIVE Final   Influenza B by PCR NEGATIVE NEGATIVE Final    Comment: (NOTE) The Xpert Xpress SARS-CoV-2/FLU/RSV plus assay is intended as an aid in the diagnosis of influenza from Nasopharyngeal swab specimens and should not be used as a sole basis for treatment. Nasal washings and aspirates are unacceptable for Xpert Xpress SARS-CoV-2/FLU/RSV testing.  Fact Sheet for Patients: EntrepreneurPulse.com.au  Fact Sheet for Healthcare Providers: IncredibleEmployment.be  This test is not yet approved or cleared by the [place] FDA and has been authorized for detection and/or diagnosis of SARS-CoV-2 by FDA under an Emergency Use Authorization (EUA). This EUA will remain in effect (meaning this test can be used) for the duration of the COVID-19 declaration under Section 564(b)(1) of the Act, 21 U.S.C. section 360bbb-3(b)(1), unless the authorization is terminated or revoked.  Performed at [facility], [address], [place], [place] [address]   Urine Culture     Status: None   Collection Time: 09/06/21  2:25 AM   Specimen: Urine, Random  Result Value Ref Range Status   Specimen Description   Final    URINE, RANDOM Performed at [facility], [address] [place], [place] [address]    Special Requests   Final    NONE Performed at [facility], [address] [place], [place] [address]    Culture   Final    NO GROWTH Performed at [facility], [place] [address] [place], [place] [address]    Report Status 09/07/2021 FINAL  Final         Radiology Studies: DG Chest Port 1 View  Result Date: 09/10/2021 CLINICAL DATA:  Chest pain EXAM: PORTABLE CHEST 1 VIEW COMPARISON:  Chest x-ray 09/06/2021 FINDINGS: Heart is enlarged. Mediastinum appears stable. Pulmonary  vasculature is within normal limits. No focal consolidation identified. No pleural effusion or pneumothorax visualized. IMPRESSION: Cardiomegaly with no acute process identified. Electronically Signed   By: Ofilia Neas M.D.   On: 09/10/2021 09:41        Scheduled Meds:  apixaban  5 mg Oral BID   carbamazepine  200 mg Oral BID   Chlorhexidine Gluconate Cloth  6 each Topical Daily   feeding supplement  237 mL Oral BID BM   furosemide  40 mg Oral Daily   irbesartan  300 mg Oral Daily   pantoprazole  40 mg Oral BID   tamsulosin  0.4 mg Oral QPC supper   vitamin B-12  1,000 mcg Oral Daily   Continuous Infusions:   ceFAZolin (ANCEF) IV 2 g (09/11/21 0508)     LOS: 6 days    Time spent: 25 minutes    Sidney Ace, MD [facility]   If 7PM-7AM, please contact night-coverage  09/11/2021, 11:44 AM

## 2021-09-12 LAB — CBC WITH DIFFERENTIAL/PLATELET
Abs Immature Granulocytes: 0.38 10*3/uL — ABNORMAL HIGH (ref 0.00–0.07)
Basophils Absolute: 0 10*3/uL (ref 0.0–0.1)
Basophils Relative: 0 %
Eosinophils Absolute: 0.2 10*3/uL (ref 0.0–0.5)
Eosinophils Relative: 3 %
HCT: 33.3 % — ABNORMAL LOW (ref 39.0–52.0)
Hemoglobin: 10.4 g/dL — ABNORMAL LOW (ref 13.0–17.0)
Immature Granulocytes: 4 %
Lymphocytes Relative: 14 %
Lymphs Abs: 1.3 10*3/uL (ref 0.7–4.0)
MCH: 23.8 pg — ABNORMAL LOW (ref 26.0–34.0)
MCHC: 31.2 g/dL (ref 30.0–36.0)
MCV: 76.2 fL — ABNORMAL LOW (ref 80.0–100.0)
Monocytes Absolute: 0.8 10*3/uL (ref 0.1–1.0)
Monocytes Relative: 8 %
Neutro Abs: 6.5 10*3/uL (ref 1.7–7.7)
Neutrophils Relative %: 71 %
Platelets: 223 10*3/uL (ref 150–400)
RBC: 4.37 MIL/uL (ref 4.22–5.81)
RDW: 24.2 % — ABNORMAL HIGH (ref 11.5–15.5)
WBC: 9.3 10*3/uL (ref 4.0–10.5)
nRBC: 0 % (ref 0.0–0.2)

## 2021-09-12 LAB — GASTROINTESTINAL PANEL BY PCR, STOOL (REPLACES STOOL CULTURE)

## 2021-09-12 LAB — BASIC METABOLIC PANEL
Anion gap: 6 (ref 5–15)
BUN: 13 mg/dL (ref 8–23)
CO2: 28 mmol/L (ref 22–32)
Calcium: 8.3 mg/dL — ABNORMAL LOW (ref 8.9–10.3)
Chloride: 101 mmol/L (ref 98–111)
Creatinine, Ser: 0.68 mg/dL (ref 0.61–1.24)
GFR, Estimated: >60 mL/min (ref >60)
Glucose, Bld: 111 mg/dL — ABNORMAL HIGH (ref 70–99)
Potassium: 3.6 mmol/L (ref 3.5–5.1)
Sodium: 135 mmol/L (ref 135–145)

## 2021-09-12 MED ORDER — LOPERAMIDE HCL 2 MG PO CAPS: 2.0000 mg | ORAL_CAPSULE | ORAL | Status: DC | PRN

## 2021-09-12 MED ORDER — SODIUM CHLORIDE 0.9 % IV SOLN: INTRAVENOUS | Status: DC | PRN

## 2021-09-12 MED ORDER — CIPROFLOXACIN HCL 500 MG PO TABS
500.0000 mg | ORAL_TABLET | Freq: Two times a day (BID) | ORAL | Status: DC
  Administered 2021-09-12 – 2021-09-13 (×3): 500 mg via ORAL
  Filled 2021-09-12 (×3): qty 1

## 2021-09-12 MED ORDER — RISAQUAD PO CAPS
1.0000 | ORAL_CAPSULE | Freq: Every day | ORAL | Status: DC
  Administered 2021-09-12 – 2021-09-13 (×2): 1 via ORAL
  Filled 2021-09-12 (×2): qty 1

## 2021-09-12 MED ORDER — PSYLLIUM 95 % PO PACK
1.0000 | PACK | Freq: Every day | ORAL | Status: DC
  Administered 2021-09-12 – 2021-09-13 (×2): 1 via ORAL
  Filled 2021-09-12 (×3): qty 1

## 2021-09-12 NOTE — TOC Progression Note (Signed)
Transition of Care [facility]) - Progression Note    Patient Details  Name: [name] MRN: [id number] Date of Birth: [birth year]  [facility] CM/SW Contact  Maree Krabbe, LCSW Phone Number: 09/12/2021, 2:27 PM  Clinical Narrative:   At this time pt has no alternative bed offers. CSW has called pt's spouse to discuss--left vmail, awaiting call back.    Expected Discharge Plan: Skilled Nursing Facility Barriers to Discharge: Continued Medical Work up  Expected Discharge Plan and Services Expected Discharge Plan: Skilled Nursing Facility In-house Referral: NA   Post Acute Care Choice: Skilled Nursing Facility Living arrangements for the past 2 months: Skilled Nursing Facility                                       Social Determinants of Health (SDOH) Interventions    Readmission Risk Interventions No flowsheet data found.

## 2021-09-12 NOTE — TOC Progression Note (Addendum)
Transition of Care [facility]) - Progression Note    Patient Details  Name: DEVANSH RIESE MRN: [id number] Date of Birth: October 31, 1940  Transition [facility] Care [facility]) CM/SW Contact  Maree Krabbe, LCSW Phone Number: 09/12/2021, 3:05 PM  Clinical Narrative:   CSW spoke with pt's spouse and she is agreeable for pt to return to [facility] as pt has no alternative bed offers. [facility] states [facility] can take pt back. However, will need updated PT note to start auth.  4:21: [facility] started auth  Expected Discharge Plan: Skilled Nursing Facility Barriers to Discharge: Continued Medical Work up  Expected Discharge Plan and Services Expected Discharge Plan: Skilled Nursing Facility In-house Referral: NA   Post Acute Care Choice: Skilled Nursing Facility Living arrangements for the past 2 months: Skilled Nursing Facility                                       Social Determinants of Health (SDOH) Interventions    Readmission Risk Interventions No flowsheet data found.

## 2021-09-12 NOTE — Progress Notes (Signed)
Occupational Therapy Treatment Patient Details Name: [name] MRN: [id number] DOB: 07-11-1941 Today's Date: 09/12/2021   History of present illness Pt is an 80 y.o. male presenting to hospital 12/12 from [facility] with fever; foley catheter (for urinary retention and bladder spasms) removed 3 days prior and since then pt demonstrating N/V/D and dysuria.  Pt admitted with complicated UTI, urinary retention, severe sepsis, E. Coli bacteremia, demand ischemia/elevated troponins (ACS ruled out), acute on chronic anemia, and L hip pain (imaging negative for fx).  Of note, pt s/p ORIF L bimalleolar ankle fx 08/19/21 (NWB'ing).  PMH includes htn, HLD, sz's, a-fib on Eliquis, DVT, chronic LBP, PAH, stroke (L LE weakness), SAH, peripheral vascular thrombectomy, L TKA, retinal detachment surgery, and posterior lumbar fusion.   OT comments  Mr. Palardy has made good progress since his rehab sessions last week. Today he is able to maintain good sitting balance for almost 20 minutes while engaging in fxl mobility tasks. He is able to perform repositioning/rolling in bed fairly INDly by using bed rails. Reports occasional sharp pain in R hip with movement, but mostly pain-free. Despite 3 attempts with +2 support, pt unable to come into standing; instead can lift buttocks off mattress by ~ 1-2 inches. Unable to scoot in sitting along EOB. Will continue with POC while pt is hospitalized, DC to SNF.   Recommendations for follow up therapy are one component of a multi-disciplinary discharge planning process, led by the attending physician.  Recommendations may be updated based on patient status, additional functional criteria and insurance authorization.    Follow Up Recommendations  Skilled nursing-short term rehab (<3 hours/day)    Assistance Recommended at Discharge Frequent or constant Supervision/Assistance  Equipment Recommendations  None recommended by OT    Recommendations for Other Services       Precautions / Restrictions Precautions Precautions: Fall Precaution Comments: Seizure precautions; Aspiration precautions Required Braces or Orthoses: Splint/Cast Splint/Cast: LLE cast Restrictions Weight Bearing Restrictions: Yes LLE Weight Bearing: Non weight bearing       Mobility Bed Mobility Overal bed mobility: Needs Assistance Bed Mobility: Supine to Sit;Sit to Supine;Rolling Rolling: Min assist   Supine to sit: Mod assist Sit to supine: Max assist   General bed mobility comments: Max for elevating B LE into bed. Heavy use of bedrails. Able to assist with repositioning towards HOB in supine. Unable to scoot along edge of bed in sitting.    Transfers Overall transfer level: Needs assistance Equipment used: Rolling walker (2 wheels) Transfers: Sit to/from Stand Sit to Stand: From elevated surface;+2 physical assistance;Max assist          Lateral/Scoot Transfers: Total assist General transfer comment: Unable to come into standing     Balance Overall balance assessment: Needs assistance Sitting-balance support: No upper extremity supported;Bilateral upper extremity supported;Single extremity supported Sitting balance-Leahy Scale: Good Sitting balance - Comments: able to maintain good sitting balance for 15+ minutes, with occassional cueing for upright posture. Able to engage in grooming and UB dressing in sitting, with no UE support.   Standing balance support: Bilateral upper extremity supported Standing balance-Leahy Scale: Zero                             ADL either performed or assessed with clinical judgement   ADL Overall ADL's : Needs assistance/impaired     Grooming: Set up;Wash/dry hands;Wash/dry face;Brushing hair;Sitting;Supervision/safety           Upper  Body Dressing : Supervision/safety         Toilet Transfer Details (indicate cue type and reason): unable to safely transfer this session Toileting- Clothing  Manipulation and Hygiene: Maximal assistance Toileting - Clothing Manipulation Details (indicate cue type and reason): pt able to anticipate when he needs to urinate or defecate. Can engage in rolling in bed to assist with clean up            Extremity/Trunk Assessment Upper Extremity Assessment Upper Extremity Assessment: Generalized weakness RUE Deficits / Details: good R hand grip strength; 4/5 shoulder flexion; 4/5 elbow flexion/extension LUE Deficits / Details: Good L hand grip strength; 4/5 shoulder flexion, 4/5 elbow flexion/extension   Lower Extremity Assessment Lower Extremity Assessment: Defer to PT evaluation   Cervical / Trunk Assessment Cervical / Trunk Exceptions: forward head/shoulders    Vision       Perception     Praxis      Cognition Arousal/Alertness: Awake/alert Behavior During Therapy: WFL for tasks assessed/performed Overall Cognitive Status: Within Functional Limits for tasks assessed                                 General Comments: talkative, requires redirection          Exercises Other Exercises Other Exercises: Bed mobility, sup<>sit, scooting, sitting balance tolerance, UB dressing, grooming. Pt/family educ re: importance of OOB activity, ECS, d/c recs.   Shoulder Instructions       General Comments skin on buttocks, scrotom red and raw. Barrier cream applied.    Pertinent Vitals/ Pain       Pain Assessment: 0-10 Pain Score: 4  Pain Location: L hip pain with mobility Pain Descriptors / Indicators: Sharp Pain Intervention(s): Limited activity within patient's tolerance;Repositioned  Home Living                                          Prior Functioning/Environment              Frequency  Min 2X/week        Progress Toward Goals  OT Goals(current goals can now be found in the care plan section)  Progress towards OT goals: Progressing toward goals  Acute Rehab OT Goals Patient Stated  Goal: to return to PLOF OT Goal Formulation: With patient/family Time For Goal Achievement: 09/23/21 Potential to Achieve Goals: Good  Plan Discharge plan remains appropriate    Co-evaluation                 AM-PAC OT "6 Clicks" Daily Activity     Outcome Measure   Help from another person eating meals?: A Little Help from another person taking care of personal grooming?: A Little Help from another person toileting, which includes using toliet, bedpan, or urinal?: A Lot Help from another person bathing (including washing, rinsing, drying)?: A Lot Help from another person to put on and taking off regular upper body clothing?: A Little Help from another person to put on and taking off regular lower body clothing?: A Lot 6 Click Score: 15    End of Session Equipment Utilized During Treatment: Rolling walker (2 wheels);Gait belt  OT Visit Diagnosis: Unsteadiness on feet (R26.81);Muscle weakness (generalized) (M62.81)   Activity Tolerance Patient tolerated treatment well   Patient Left in bed;with call bell/phone within reach;with bed alarm  set;with family/visitor present;with nursing/sitter in room   Nurse Communication          Time: [phone] OT Time Calculation (min): 54 min  Charges: OT General Charges $OT Visit: 1 Visit OT Treatments $Self Care/Home Management : 53-67 mins  Latina Craver, PhD, MS, OTR/L 09/12/21, 3:18 PM

## 2021-09-12 NOTE — Progress Notes (Signed)
Physical Therapy Treatment Patient Details Name: [name] MRN: [id number] DOB: 09/03/1941 Today's Date: 09/12/2021   History of Present Illness Pt is an 80 y.o. male presenting to hospital 12/12 from [facility] with fever; foley catheter (for urinary retention and bladder spasms) removed 3 days prior and since then pt demonstrating N/V/D and dysuria.  Pt admitted with complicated UTI, urinary retention, severe sepsis, E. Coli bacteremia, demand ischemia/elevated troponins (ACS ruled out), acute on chronic anemia, and L hip pain (imaging negative for fx).  Of note, pt s/p ORIF L bimalleolar ankle fx 08/19/21 (NWB'ing).  PMH includes htn, HLD, sz's, a-fib on Eliquis, DVT, chronic LBP, PAH, stroke (L LE weakness), SAH, peripheral vascular thrombectomy, L TKA, retinal detachment surgery, and posterior lumbar fusion.    PT Comments    Pt resting in bed upon PT arrival; pt's wife present for session.  No twitching/jerking movement of extremities noted today and pt reporting feeling better.  Mild L hip pain noted with activity/mobility but 0/10 at rest end of session.  Mod to max assist x1 with bed mobility and max assist x1 to attempt to stand x5 trials from significantly elevated bed height up to RW (pt able to clear bottom 3/5 trials about 1-2 inches off of bed).  Pt appearing motivated to participate in therapy.  Overall pt did better with 1 step cues than multi-step cues.  Will continue to focus on strengthening and progressive functional mobility during pt's hospitalization.   Recommendations for follow up therapy are one component of a multi-disciplinary discharge planning process, led by the attending physician.  Recommendations may be updated based on patient status, additional functional criteria and insurance authorization.  Follow Up Recommendations  Skilled nursing-short term rehab (<3 hours/day)     Assistance Recommended at Discharge Frequent or constant Supervision/Assistance   Equipment Recommendations  Wheelchair (measurements PT);Wheelchair cushion (measurements PT);BSC/3in1    Recommendations for Other Services OT consult     Precautions / Restrictions Precautions Precautions: Fall Precaution Comments: Seizure precautions; Aspiration precautions Required Braces or Orthoses: Splint/Cast Splint/Cast: LLE cast Restrictions Weight Bearing Restrictions: Yes LLE Weight Bearing: Non weight bearing     Mobility  Bed Mobility Overal bed mobility: Needs Assistance Bed Mobility: Supine to Sit;Sit to Supine   Supine to sit: Mod assist;HOB elevated (assist for trunk) Sit to supine: Mod assist;Max assist;HOB elevated (assist for trunk and B LE's)   General bed mobility comments: vc's for technique and use of bed rail; pt able to pull himself up in bed (to reposition end of session) using bedrail with bed flat    Transfers Overall transfer level: Needs assistance Equipment used: Rolling walker (2 wheels) Transfers: Sit to/from Stand Sit to Stand: Max assist;From elevated surface         General transfer comment: bed height significantly elevated; vc's for UE/LE placement; assist for L LE NWB'ing status; able to clear bottom 3/5 trials (about 1-2 inches off of bed)    Ambulation/Gait               General Gait Details: not appropriate at this time   Stairs             Wheelchair Mobility    Modified Rankin (Stroke Patients Only)       Balance Overall balance assessment: Needs assistance Sitting-balance support: No upper extremity supported (L LE NWB'ing) Sitting balance-Leahy Scale: Good Sitting balance - Comments: steady static sitting and reaching within BOS  Cognition Arousal/Alertness: Awake/alert Behavior During Therapy: Flat affect Overall Cognitive Status: Within Functional Limits for tasks assessed                                 General Comments: Fairly  consistent with 1 step cues but requires extra cueing for multi-step cues        Exercises   General Comments General comments (skin integrity, edema, etc.): L LE cast in place.      Pertinent Vitals/Pain Pain Assessment: 0-10 Pain Score: 0-No pain (0/10 at rest; 2-4/10 L hip pain with activity) Pain Location: L hip pain with activity Pain Descriptors / Indicators: Sharp Pain Intervention(s): Limited activity within patient's tolerance;Monitored during session;Repositioned Vitals (HR and O2 on room air) stable and WFL throughout treatment session.    Home Living                          Prior Function            PT Goals (current goals can now be found in the care plan section) Acute Rehab PT Goals Patient Stated Goal: to improve strength and mobility PT Goal Formulation: With patient/family Time For Goal Achievement: 09/23/21 Potential to Achieve Goals: Fair Additional Goals Additional Goal #1: Pt able to propel w/c x150 feet modified independently for safe home mobility. Progress towards PT goals: Progressing toward goals    Frequency    Min 2X/week      PT Plan Current plan remains appropriate    Co-evaluation              AM-PAC PT "6 Clicks" Mobility   Outcome Measure  Help needed turning from your back to your side while in a flat bed without using bedrails?: A Lot Help needed moving from lying on your back to sitting on the side of a flat bed without using bedrails?: A Lot Help needed moving to and from a bed to a chair (including a wheelchair)?: Total Help needed standing up from a chair using your arms (e.g., wheelchair or bedside chair)?: Total Help needed to walk in hospital room?: Total Help needed climbing 3-5 steps with a railing? : Total 6 Click Score: 8    End of Session Equipment Utilized During Treatment: Gait belt Activity Tolerance: Patient tolerated treatment well Patient left: in bed;with call bell/phone within  reach;with bed alarm set;with family/visitor present;Other (comment) (L LE elevated on pillow) Nurse Communication: Mobility status;Precautions;Weight bearing status PT Visit Diagnosis: Other abnormalities of gait and mobility (R26.89);Muscle weakness (generalized) (M62.81);History of falling (Z91.81);Difficulty in walking, not elsewhere classified (R26.2);Pain Pain - Right/Left: Left Pain - part of body: Hip     Time: [id number] PT Time Calculation (min) (ACUTE ONLY): 28 min  Charges:  $Therapeutic Activity: 23-37 mins                     Hendricks Limes, PT 09/12/21, 3:56 PM

## 2021-09-12 NOTE — Care Management Important Message (Signed)
Important Message  Patient Details  Name: [name] MRN: [id number] Date of Birth: Jun 21, 1941   Medicare Important Message Given:  Yes     Johnell Comings 09/12/2021, 11:43 AM

## 2021-09-12 NOTE — Progress Notes (Signed)
PROGRESS NOTE    [name]  XNA:[id number] DOB: [birth year] DOA: 09/05/2021 PCP: [name], MD    Brief Narrative:  80 y.o. male seen in ed with complaints of  Dysuria fever and chills since Friday evening.Pt is not able to start a stream.  Pt also reports diarrhea. Incomplete emptying.  Patient has dementia and is able to give [place] history but briefly.  And was seen about a week ago for acute urinary retention and had a Foley placed and was discharged with urology follow-up which he has yet to do as the appointment is upcoming.  Currently patient denies any headaches blurred vision chest pain shortness of breath nausea vomiting.  Patient states that he has had an intracranial hemorrhage and had to have neurosurgery at [facility].  He is on Eliquis currently and has been cleared through his cardiologist.  The following morning patient states that he has some pain in the hip and left leg status post fracture.  No specific complaints of abdominal pain on my evaluation however per son at bedside patient had excruciating abdominal pain prior to presentation.  Blood cultures now positive for E. Coli  Patient hemodynamics have been improving.  Abdominal pain and orthopedic pain also improved.  Pain regimen has been difficult to achieve as patient's mental status is readily affected.  Narcotics and gabapentin seem to have deleterious effects on his mentation.   Assessment & Plan:   Principal Problem:   Dysuria Active Problems:   Atrial fibrillation (HCC)   Hypertension   Seizures (HCC)   Stroke (HCC)   DVT (deep venous thrombosis) (HCC)   History of cerebral hemorrhage   Occult GI bleeding   Severe sepsis (HCC)   Anemia   AKI (acute kidney injury) ([place])   Elevated glucose   Acute pyelonephritis   Complicated UTI (urinary tract infection)   Complicated urinary tract infection E. coli bacteremia Urinary retention Severe sepsis secondary to above Retention followed hospitalization  and subsequent rehab stay status post left leg fracture Severe sepsis met by heart rate, temperature, elevated lactic acid CT abdomen with cystitis and possible pyelonephritis No stone or abscess Hemodynamics reassuring E. coli pansensitive Voiding trial successful.  Patient urinating spontaneously Plan: Continue IV Ancef, plan for 10-day total antibiotic course No IV fluids Bladder scan as needed Continue daily Flomax  Demand ischemia/elevated troponins ACS ruled out No changes on EKG, no chest pain Troponin peaked at 624, likely secondary to sepsis TTE: EF 55-60 with no WMA Plan: DC telemetry Outpatient cardiology follow-up  Acute on chronic anemia No indication for transfusion currently  Left hip pain Acute on chronic, suspect referred pain from left ankle fracture Plain film x-ray negative for fracture Does not respond well to pain medication.   Pain control improved  Left ankle fracture Status post operative repair.  Now in cast.  Coming from SNF Issues urinary retention started after hospitalization and no surgery Therapy evaluations as able Foley catheter discontinued  Paroxysmal atrial fibrillation Continue home Eliquis  Seizure disorder Per patient no seizures since late 80s Continue home Tegretol  Essential hypertension Home antihypertensives on hold in setting of sepsis   DVT prophylaxis: Eliquis Code Status: Full Family Communication: Spouse and son at bedside 12/14, 12/15, spouse at bedside 12/16, spouse at bedside 12/17, spouse at bedside 12/18, 12/19 Disposition Plan: Status is: Inpatient  Remains inpatient appropriate because: Severe sepsis, E. coli bacteremia in the setting of urinary retention, complicated UTI.  Mentation appears to be improving.  Foley catheter  discontinued.  Patient medically ready for discharge at this time.  Pending placement      Level of care: Med-Surg  Consultants:  None  Procedures:   None  Antimicrobials: Ancef   Subjective: Patient seen and examined.  No acute distress.  Wife at bedside  Objective: Vitals:   09/11/21 1551 09/11/21 2106 09/12/21 0343 09/12/21 0940  BP: (!) 166/82 (!) 157/80 (!) 167/83 (!) 150/87  Pulse: 75 80 90 80  Resp: $Remo'18 18 20 18  'PFdMi$ Temp: 98.1 F (36.7 C) 98.4 F (36.9 C) 98.6 F (37 C) 98.3 F (36.8 C)  TempSrc:   Oral Oral  SpO2: 98% 98% 93% 97%  Weight:      Height:        Intake/Output Summary (Last 24 hours) at 09/12/2021 1125 Last data filed at 09/12/2021 0515 Gross per 24 hour  Intake 278.05 ml  Output 900 ml  Net -621.95 ml   Filed Weights   09/05/21 1518  Weight: 97.5 kg    Examination:  General exam: No acute distress.  Lying in bed Respiratory system: Clear lungs.  Normal work of breathing.  Room air Cardiovascular system: S1-S2, RRR, no murmurs, no pedal edema Gastrointestinal system: Soft, NT/ND, normal bowel sounds Central nervous system: Alert.  Oriented x2.  No focal deficits Extremities: LLE in surgical cast Skin: No rashes, lesions or ulcers Psychiatry: Judgement and insight appear impaired. Mood & affect flattened.     Data Reviewed: I have personally reviewed following labs and imaging studies  CBC: Recent Labs  Lab 09/05/21 1521 09/06/21 0225 09/07/21 0701 09/08/21 0249 09/09/21 0753 09/10/21 1013 09/12/21 0825  WBC 26.0*   < > 19.1* 13.4* 10.7* 10.9* 9.3  NEUTROABS 23.6*  --   --   --  8.1* 8.5* 6.5  HGB 10.6*   < > 8.9* 8.9* 9.4* 10.3* 10.4*  HCT 35.7*   < > 28.9* 28.9* 31.0* 32.9* 33.3*  MCV 77.3*   < > 78.1* 78.3* 79.1* 77.0* 76.2*  PLT 268   < > 150 158 159 182 223   < > = values in this interval not displayed.   Basic Metabolic Panel: Recent Labs  Lab 09/05/21 2344 09/06/21 0707 09/06/21 1401 09/07/21 0701 09/09/21 0753 09/10/21 1013 09/12/21 0825  NA 132* 131* 132*  --  134* 131* 135  K 3.3* 3.9 3.7  --  4.6 3.2* 3.6  CL 100 100 103  --  107 102 101  CO2 $Re'25 26 25   'SOO$ --  $R'23 22 28  'ye$ GLUCOSE 115* 152* 103*  --  110* 161* 111*  BUN 27* 29* 29*  --  31* 24* 13  CREATININE 1.23 1.26* 1.14 1.14 1.00 1.07 0.68  CALCIUM 8.4* 7.8* 7.8*  --  8.5* 8.1* 8.3*  MG 1.6* 2.3  --   --   --  1.8  --   PHOS 1.5* 5.0*  --   --   --   --   --    GFR: Estimated Creatinine Clearance: 87.7 mL/min (by C-G formula based on SCr of 0.68 mg/dL). Liver Function Tests: Recent Labs  Lab 09/05/21 1521 09/05/21 2344 09/06/21 0707  AST 30 30 33  ALT $Re'19 17 17  'eVZ$ ALKPHOS 140* 120 99  BILITOT 1.1 1.3* 1.1  PROT 7.3 5.6* 5.2*  ALBUMIN 3.4* 2.7* 2.4*   Recent Labs  Lab 09/05/21 1536  LIPASE 25   No results for input(s): AMMONIA in the last 168 hours. Coagulation Profile: Recent Labs  Lab 09/05/21 1521 09/06/21 0225  INR 1.7* 2.2*   Cardiac Enzymes: No results for input(s): CKTOTAL, CKMB, CKMBINDEX, TROPONINI in the last 168 hours. BNP (last 3 results) No results for input(s): PROBNP in the last 8760 hours. HbA1C: No results for input(s): HGBA1C in the last 72 hours.  CBG: Recent Labs  Lab 09/06/21 0230  GLUCAP 118*   Lipid Profile: No results for input(s): CHOL, HDL, LDLCALC, TRIG, CHOLHDL, LDLDIRECT in the last 72 hours. Thyroid Function Tests: No results for input(s): TSH, T4TOTAL, FREET4, T3FREE, THYROIDAB in the last 72 hours.  Anemia Panel: No results for input(s): VITAMINB12, FOLATE, FERRITIN, TIBC, IRON, RETICCTPCT in the last 72 hours.  Sepsis Labs: Recent Labs  Lab 09/05/21 1528 09/05/21 1536 09/05/21 2121 09/05/21 2344 09/06/21 0707 09/06/21 1401  PROCALCITON  --  26.01  --   --  42.04  --   LATICACIDVEN 3.1*  --  6.4* 3.5*  --  1.4    Recent Results (from the past 240 hour(s))  Culture, blood (Routine x 2)     Status: Abnormal   Collection Time: 09/05/21  3:20 PM   Specimen: BLOOD  Result Value Ref Range Status   Specimen Description   Final    BLOOD LEFT ANTECUBITAL Performed at [facility], [address]  [place], [place] [address]    Special Requests   Final    BOTTLES DRAWN AEROBIC AND ANAEROBIC Blood Culture adequate volume Performed at [facility], [address] [place], [place] [address]    Culture  Setup Time   Final    GRAM NEGATIVE RODS ANAEROBIC BOTTLE ONLY CRITICAL RESULT CALLED TO, READ BACK BY AND VERIFIED WITH: Virl Cagey $RemoveBe'@2146'[email or web address]$  on 09/06/21 skl    Culture ESCHERICHIA COLI (A)  Final   Report Status 09/09/2021 FINAL  Final   Organism ID, Bacteria ESCHERICHIA COLI  Final      Susceptibility   Escherichia coli - MIC*    AMPICILLIN 4 SENSITIVE Sensitive     CEFAZOLIN <=4 SENSITIVE Sensitive     CEFEPIME <=0.12 SENSITIVE Sensitive     CEFTAZIDIME <=1 SENSITIVE Sensitive     CEFTRIAXONE <=0.25 SENSITIVE Sensitive     CIPROFLOXACIN <=0.25 SENSITIVE Sensitive     GENTAMICIN <=1 SENSITIVE Sensitive     IMIPENEM <=0.25 SENSITIVE Sensitive     TRIMETH/SULFA <=20 SENSITIVE Sensitive     AMPICILLIN/SULBACTAM <=2 SENSITIVE Sensitive     PIP/TAZO <=4 SENSITIVE Sensitive     * ESCHERICHIA COLI  Culture, blood (Routine x 2)     Status: None   Collection Time: 09/05/21  3:20 PM   Specimen: BLOOD  Result Value Ref Range Status   Specimen Description BLOOD BLOOD LEFT ARM  Final   Special Requests   Final    BOTTLES DRAWN AEROBIC AND ANAEROBIC Blood Culture adequate volume   Culture   Final    NO GROWTH 5 DAYS Performed at [facility], [address] [place], [place] [address]    Report Status 09/10/2021 FINAL  Final  Blood Culture ID Panel (Reflexed)     Status: Abnormal   Collection Time: 09/05/21  3:20 PM  Result Value Ref Range Status   Enterococcus faecalis NOT DETECTED NOT DETECTED Final   Enterococcus Faecium NOT DETECTED NOT DETECTED Final   Listeria monocytogenes NOT DETECTED NOT DETECTED Final   Staphylococcus species NOT DETECTED NOT DETECTED Final   Staphylococcus aureus (BCID) NOT DETECTED NOT DETECTED Final   Staphylococcus epidermidis NOT  DETECTED  NOT DETECTED Final   Staphylococcus lugdunensis NOT DETECTED NOT DETECTED Final   Streptococcus species NOT DETECTED NOT DETECTED Final   Streptococcus agalactiae NOT DETECTED NOT DETECTED Final   Streptococcus pneumoniae NOT DETECTED NOT DETECTED Final   Streptococcus pyogenes NOT DETECTED NOT DETECTED Final   A.calcoaceticus-baumannii NOT DETECTED NOT DETECTED Final   Bacteroides fragilis NOT DETECTED NOT DETECTED Final   Enterobacterales DETECTED (A) NOT DETECTED Final    Comment: Enterobacterales represent a large order of gram negative bacteria, not a single organism. CRITICAL RESULT CALLED TO, READ BACK BY AND VERIFIED WITH: Virl Cagey $RemoveBe'@2146'[email or web address]$  on 09/06/21 skl    Enterobacter cloacae complex NOT DETECTED NOT DETECTED Final   Escherichia coli DETECTED (A) NOT DETECTED Final    Comment: CRITICAL RESULT CALLED TO, READ BACK BY AND VERIFIED WITH: Virl Cagey $RemoveBe'@2146'[email or web address]$  on 09/06/21 skl    Klebsiella aerogenes NOT DETECTED NOT DETECTED Final   Klebsiella oxytoca NOT DETECTED NOT DETECTED Final   Klebsiella pneumoniae NOT DETECTED NOT DETECTED Final   Proteus species NOT DETECTED NOT DETECTED Final   Salmonella species NOT DETECTED NOT DETECTED Final   Serratia marcescens NOT DETECTED NOT DETECTED Final   Haemophilus influenzae NOT DETECTED NOT DETECTED Final   Neisseria meningitidis NOT DETECTED NOT DETECTED Final   Pseudomonas aeruginosa NOT DETECTED NOT DETECTED Final   Stenotrophomonas maltophilia NOT DETECTED NOT DETECTED Final   Candida albicans NOT DETECTED NOT DETECTED Final   Candida auris NOT DETECTED NOT DETECTED Final   Candida glabrata NOT DETECTED NOT DETECTED Final   Candida krusei NOT DETECTED NOT DETECTED Final   Candida parapsilosis NOT DETECTED NOT DETECTED Final   Candida tropicalis NOT DETECTED NOT DETECTED Final   Cryptococcus neoformans/gattii NOT DETECTED NOT DETECTED Final   CTX-M ESBL NOT DETECTED NOT DETECTED Final   Carbapenem resistance IMP NOT  DETECTED NOT DETECTED Final   Carbapenem resistance KPC NOT DETECTED NOT DETECTED Final   Carbapenem resistance NDM NOT DETECTED NOT DETECTED Final   Carbapenem resist OXA 48 LIKE NOT DETECTED NOT DETECTED Final   Carbapenem resistance VIM NOT DETECTED NOT DETECTED Final    Comment: Performed at [facility], [address] [place],  [address]  Resp Panel by RT-PCR (Flu A&B, Covid) Nasopharyngeal Swab     Status: None   Collection Time: 09/05/21  3:21 PM   Specimen: Nasopharyngeal Swab; Nasopharyngeal(NP) swabs in vial transport medium  Result Value Ref Range Status   SARS Coronavirus 2 by RT PCR NEGATIVE NEGATIVE Final    Comment: (NOTE) SARS-CoV-2 target nucleic acids are NOT DETECTED.  The SARS-CoV-2 RNA is generally detectable in upper respiratory specimens during the acute phase of infection. The lowest concentration of SARS-CoV-2 viral copies this assay can detect is 138 copies/mL. A negative result does not preclude SARS-Cov-2 infection and should not be used as the sole basis for treatment or other patient management decisions. A negative result may occur with  improper specimen collection/handling, submission of specimen other than nasopharyngeal swab, presence of viral mutation(s) within the areas targeted by this assay, and inadequate number of viral copies(<138 copies/mL). A negative result must be combined with clinical observations, patient history, and epidemiological information. The expected result is Negative.  Fact Sheet for Patients:  EntrepreneurPulse.com.au  Fact Sheet for Healthcare Providers:  IncredibleEmployment.be  This test is no t yet approved or cleared by the [place] FDA and  has been authorized for detection and/or diagnosis of SARS-CoV-2 by FDA under an Emergency Use Authorization (EUA).  This EUA will remain  in effect (meaning this test can be used) for the duration of the COVID-19  declaration under Section 564(b)(1) of the Act, 21 U.S.C.section 360bbb-3(b)(1), unless the authorization is terminated  or revoked sooner.       Influenza A by PCR NEGATIVE NEGATIVE Final   Influenza B by PCR NEGATIVE NEGATIVE Final    Comment: (NOTE) The Xpert Xpress SARS-CoV-2/FLU/RSV plus assay is intended as an aid in the diagnosis of influenza from Nasopharyngeal swab specimens and should not be used as a sole basis for treatment. Nasal washings and aspirates are unacceptable for Xpert Xpress SARS-CoV-2/FLU/RSV testing.  Fact Sheet for Patients: BloggerCourse.com  Fact Sheet for Healthcare Providers: SeriousBroker.it  This test is not yet approved or cleared by the [place] FDA and has been authorized for detection and/or diagnosis of SARS-CoV-2 by FDA under an Emergency Use Authorization (EUA). This EUA will remain in effect (meaning this test can be used) for the duration of the COVID-19 declaration under Section 564(b)(1) of the Act, 21 U.S.C. section 360bbb-3(b)(1), unless the authorization is terminated or revoked.  Performed at [facility], [address], [place], [place] [address]   Urine Culture     Status: None   Collection Time: 09/06/21  2:25 AM   Specimen: Urine, Random  Result Value Ref Range Status   Specimen Description   Final    URINE, RANDOM Performed at [facility], [address], [place], [place] [address]    Special Requests   Final    NONE Performed at [facility], [address], [place], [place] [address]    Culture   Final    NO GROWTH Performed at [facility], [address] [place], [place] [address]    Report Status 09/07/2021 FINAL  Final  Gastrointestinal Panel by PCR , Stool     Status: None   Collection Time: 09/12/21 12:04 AM   Specimen: Stool  Result Value Ref Range Status   Campylobacter species NOT DETECTED NOT DETECTED Final    Plesimonas shigelloides NOT DETECTED NOT DETECTED Final   Salmonella species NOT DETECTED NOT DETECTED Final   Yersinia enterocolitica NOT DETECTED NOT DETECTED Final   Vibrio species NOT DETECTED NOT DETECTED Final   Vibrio cholerae NOT DETECTED NOT DETECTED Final   Enteroaggregative E coli (EAEC) NOT DETECTED NOT DETECTED Final   Enteropathogenic E coli (EPEC) NOT DETECTED NOT DETECTED Final   Enterotoxigenic E coli (ETEC) NOT DETECTED NOT DETECTED Final   Shiga like toxin producing E coli (STEC) NOT DETECTED NOT DETECTED Final   Shigella/Enteroinvasive E coli (EIEC) NOT DETECTED NOT DETECTED Final   Cryptosporidium NOT DETECTED NOT DETECTED Final   Cyclospora cayetanensis NOT DETECTED NOT DETECTED Final   Entamoeba histolytica NOT DETECTED NOT DETECTED Final   Giardia lamblia NOT DETECTED NOT DETECTED Final   Adenovirus F40/41 NOT DETECTED NOT DETECTED Final   Astrovirus NOT DETECTED NOT DETECTED Final   Norovirus GI/GII NOT DETECTED NOT DETECTED Final   Rotavirus A NOT DETECTED NOT DETECTED Final   Sapovirus (I, II, IV, and V) NOT DETECTED NOT DETECTED Final    Comment: Performed at [facility], [address], [place], [place] [address]         Radiology Studies: No results found.      Scheduled Meds:  acidophilus  1 capsule Oral Daily   apixaban  5 mg Oral BID   carbamazepine  200 mg Oral BID  Chlorhexidine Gluconate Cloth  6 each Topical Daily   feeding supplement  237 mL Oral BID BM   furosemide  40 mg Oral Daily   irbesartan  300 mg Oral Daily   pantoprazole  40 mg Oral BID   psyllium  1 packet Oral Daily   tamsulosin  0.4 mg Oral QPC supper   vitamin B-12  1,000 mcg Oral Daily   Continuous Infusions:  sodium chloride Stopped (09/12/21 0508)    ceFAZolin (ANCEF) IV 200 mL/hr at 09/12/21 0515     LOS: 7 days    Time spent: 25 minutes    Sidney Ace, MD [facility]   If 7PM-7AM, please contact  night-coverage  09/12/2021, 11:25 AM

## 2021-09-13 MED ORDER — CIPROFLOXACIN HCL 500 MG PO TABS: 500.0000 mg | ORAL_TABLET | Freq: Two times a day (BID) | ORAL | 0 refills | Status: AC

## 2021-09-13 MED ORDER — TAMSULOSIN HCL 0.4 MG PO CAPS: 0.4000 mg | ORAL_CAPSULE | Freq: Every day | ORAL | 0 refills | Status: AC

## 2021-09-13 NOTE — Clinical Social Work Note (Cosign Needed)
°  °  Durable Medical Equipment  (From admission, onward)           Start     Ordered   09/13/21 0849  For home use only DME Hospital bed  Once       Question Answer Comment  Length of Need 12 Months   The above medical condition requires: Patient requires the ability to reposition frequently   Bed type Semi-electric      09/13/21 0848

## 2021-09-13 NOTE — TOC Progression Note (Signed)
Transition of Care [facility]) - Progression Note    Patient Details  Name: [name] MRN: [id number] Date of Birth: 1940-12-17  Transition [facility]) CM/SW Contact  Maree Krabbe, LCSW Phone Number: 09/13/2021, 8:46 AM  Clinical Narrative: CSW spoke with pt's spouse and at this time pt is refusing to go to SNF. Pt's spouse is requesting [facility] for [facility]. Pt's spouse also states she has a brand new wheelchair, a bsc, and a 3n1. Pt's spouse is requesting a hospital bed. MD notified.      Expected Discharge Plan: Skilled Nursing Facility Barriers to Discharge: Continued Medical Work up  Expected Discharge Plan and Services Expected Discharge Plan: Skilled Nursing Facility In-house Referral: NA   Post Acute Care Choice: Skilled Nursing Facility Living arrangements for the past 2 months: Skilled Nursing Facility                                       Social Determinants of Health (SDOH) Interventions    Readmission Risk Interventions No flowsheet data found.

## 2021-09-13 NOTE — TOC Transition Note (Signed)
Transition of Care [facility]) - CM/SW Discharge Note   Patient Details  Name: DANFORD TAT MRN: [id number] Date of Birth: 1941-08-15  Transition of Care [facility]) CM/SW Contact:  Maree Krabbe, LCSW Phone Number: 09/13/2021, 2:46 PM   Clinical Narrative:   [facility] arranged for transport home.    Final next level of care: Home w Home Health Services Barriers to Discharge: No Barriers Identified   Patient Goals and CMS Choice Patient states their goals for this hospitalization and ongoing recovery are:: to go to snf   Choice offered to / list presented to : Spouse  Discharge Placement              Patient chooses bed at:  (Pt to be transfered to pt's home) Patient to be transferred to facility by: [facility] Name of family member notified: spouse Patient and family notified of of transfer: 09/13/21  Discharge Plan and Services In-house Referral: NA   Post Acute Care Choice: Skilled Nursing Facility                               Social Determinants of Health (SDOH) Interventions     Readmission Risk Interventions No flowsheet data found.

## 2021-09-13 NOTE — Progress Notes (Signed)
EMS arrived and will transport pt to home. External catheter removed. IV access removed by day shift nurse. Discharge packet given to pt wife by day nurse.

## 2021-09-13 NOTE — TOC Progression Note (Signed)
Transition of Care [facility]) - Progression Note    Patient Details  Name: [name] MRN: [id number] Date of Birth: 1941/01/30  Transition of Care [facility]) CM/SW Contact  Maree Krabbe, LCSW Phone Number: 09/13/2021, 12:11 PM  Clinical Narrative:   [name] with [employer] called spouse regarding hospital bed and the wife states she is not agreeable yet and would let [employer] know. Pt's wife is requesting a wheelchair with removable arms--[employer] notified.    Expected Discharge Plan: Skilled Nursing Facility Barriers to Discharge: Continued Medical Work up  Expected Discharge Plan and Services Expected Discharge Plan: Skilled Nursing Facility In-house Referral: NA   Post Acute Care Choice: Skilled Nursing Facility Living arrangements for the past 2 months: Skilled Nursing Facility                                       Social Determinants of Health (SDOH) Interventions    Readmission Risk Interventions No flowsheet data found.

## 2021-09-13 NOTE — Discharge Summary (Signed)
Physician Discharge Summary  [name] QQV:[id number] DOB: [birth year] DOA: 09/05/2021  PCP: [name], MD  Admit date: 09/05/2021 Discharge date: 09/13/2021  Admitted From: Home Disposition: Home with home health  Recommendations for Outpatient Follow-up:  Follow up with PCP in 1-2 weeks Consider referral to urology  Home Health: Yes PT, OT, RN, aide Equipment/Devices: Wheelchair  Discharge Condition: Stable CODE STATUS: Full Diet recommendation: Regular  Brief/Interim Summary:  80 y.o. male seen in ed with complaints of  Dysuria fever and chills since Friday evening.Pt is not able to start a stream.  Pt also reports diarrhea. Incomplete emptying.  Patient has dementia and is able to give [place] history but briefly.  And was seen about a week ago for acute urinary retention and had a Foley placed and was discharged with urology follow-up which he has yet to do as the appointment is upcoming.  Currently patient denies any headaches blurred vision chest pain shortness of breath nausea vomiting.  Patient states that he has had an intracranial hemorrhage and had to have neurosurgery at [facility].  He is on Eliquis currently and has been cleared through his cardiologist.   The following morning patient states that he has some pain in the hip and left leg status post fracture.  No specific complaints of abdominal pain on my evaluation however per son at bedside patient had excruciating abdominal pain prior to presentation.  Blood cultures now positive for E. Coli   Patient hemodynamics have been improving.  Abdominal pain and orthopedic pain also improved.  Pain regimen has been difficult to achieve as patient's mental status is readily affected.  Narcotics and gabapentin seem to have deleterious effects on his mentation.  On day of discharge patient is mentating clearly.  Had a lengthy discussion regarding his disposition.  My recommendations are for patient to return to [facility]  however patient and wife elected to return home with home health services.  Full scope of home health services ordered including PT, OT, RN, aide.  Home DME ordered per TOC recommendations.  Discharged home at this time.  Will complete additional 3 days of p.o. ciprofloxacin for 10-day total treatment for gram-negative sepsis.  Home doxazosin switched for Flomax as patient seem to be responding to it.  He is urinating spontaneously time of discharge.   Discharge Diagnoses:  Principal Problem:   Dysuria Active Problems:   Atrial fibrillation (HCC)   Hypertension   Seizures (HCC)   Stroke (HCC)   DVT (deep venous thrombosis) (HCC)   History of cerebral hemorrhage   Occult GI bleeding   Severe sepsis (HCC)   Anemia   AKI (acute kidney injury) (Burden)   Elevated glucose   Acute pyelonephritis   Complicated UTI (urinary tract infection)  Complicated urinary tract infection E. coli bacteremia Urinary retention Severe sepsis secondary to above Retention followed hospitalization and subsequent rehab stay status post left leg fracture Severe sepsis met by heart rate, temperature, elevated lactic acid CT abdomen with cystitis and possible pyelonephritis No stone or abscess Hemodynamics reassuring E. coli pansensitive Voiding trial successful.  Patient urinating spontaneously Plan: DC IV Ancef.  Transition to p.o. ciprofloxacin.  Complete additional 3 days post discharge for total 10-day course.  Home Cardura switched for Flomax given improved response to this medication.  Follow-up outpatient PCP within 1 to 2 weeks.   Demand ischemia/elevated troponins ACS ruled out No changes on EKG, no chest pain Troponin peaked at 624, likely secondary to sepsis TTE: EF 55-60 with  no WMA Plan: Outpatient cardiology follow-up   Acute on chronic anemia No indication for transfusion currently   Left hip pain Acute on chronic, suspect referred pain from left ankle fracture Plain film x-ray  negative for fracture Does not respond well to pain medication.   Pain control improved at time of discharge   Left ankle fracture Status post operative repair.  Now in cast.  Coming from SNF Issues urinary retention started after hospitalization and no surgery Therapy evaluations as able Foley catheter discontinued Patient able to void   Paroxysmal atrial fibrillation Continue home Eliquis   Seizure disorder Per patient no seizures since late 80s Continue home Tegretol   Essential hypertension Home antihypertensives held in the setting of sepsis.  Can resume on discharge  Discharge Instructions  Discharge Instructions     Diet - low sodium heart healthy   Complete by: As directed    Increase activity slowly   Complete by: As directed    No wound care   Complete by: As directed       Allergies as of 09/13/2021       Reactions   Codeine Nausea Only, Other (See Comments)   Fentanyl Other (See Comments)   Given before knee surgery, blood pressure became extremely low   Regadenoson Other (See Comments)   Created cardiac arrest   Verapamil Other (See Comments)   Per patient: Medication has caused low heart rate and caused patient to have afib that he currently has   Acetaminophen Other (See Comments)   Pt states he feels hot when he takes acetaminophen        Medication List     STOP taking these medications    amoxicillin 500 MG tablet Commonly known as: AMOXIL   doxazosin 1 MG tablet Commonly known as: CARDURA       TAKE these medications    acetaminophen 325 MG tablet Commonly known as: TYLENOL Take 1-2 tablets (325-650 mg total) by mouth every 6 (six) hours as needed for mild pain (pain score 1-3 or temp > 100.5).   aliskiren 300 MG tablet Commonly known as: TEKTURNA Take 300 mg by mouth daily.   carbamazepine 200 MG tablet Commonly known as: TEGRETOL Take 200 mg by mouth 2 (two) times a day.   ciprofloxacin 500 MG tablet Commonly known  as: CIPRO Take 1 tablet (500 mg total) by mouth 2 (two) times daily for 3 days.   docusate sodium 100 MG capsule Commonly known as: COLACE Take 1 capsule (100 mg total) by mouth 2 (two) times daily.   Eliquis 5 MG Tabs tablet Generic drug: apixaban Take 5 mg by mouth 2 (two) times daily.   Fish Oil 1200 MG Caps Take 1,200 mg by mouth daily.   furosemide 40 MG tablet Commonly known as: LASIX Take 40 mg by mouth daily.   HYDROcodone-acetaminophen 5-325 MG tablet Commonly known as: NORCO/VICODIN Take 1 tablet by mouth every 6 (six) hours as needed for moderate pain.   irbesartan 300 MG tablet Commonly known as: AVAPRO Take 300 mg by mouth daily.   pantoprazole 40 MG tablet Commonly known as: PROTONIX Take 40 mg by mouth at bedtime.   senna 8.6 MG Tabs tablet Commonly known as: SENOKOT Take 1 tablet (8.6 mg total) by mouth 2 (two) times daily.   simvastatin 20 MG tablet Commonly known as: ZOCOR Take 20 mg by mouth 2 (two) times daily.   tamsulosin 0.4 MG Caps capsule Commonly known as: FLOMAX Take 1  capsule (0.4 mg total) by mouth daily after supper.   vitamin B-12 1000 MCG tablet Commonly known as: CYANOCOBALAMIN Take 1,000 mcg by mouth daily.               Durable Medical Equipment  (From admission, onward)           Start     Ordered   09/13/21 1241  For home use only DME standard manual wheelchair with seat cushion  Once       Comments: Patient suffers from weakness, functional decline which impairs their ability to perform daily activities like bathing, dressing, feeding, grooming, and toileting in the home.  A cane, crutch, or walker will not resolve issue with performing activities of daily living. A wheelchair will allow patient to safely perform daily activities. Patient can safely propel the wheelchair in the home or has a caregiver who can provide assistance. Length of need Lifetime. Accessories: elevating leg rests (ELRs), wheel locks,  extensions and anti-tippers. Needs removable arms   09/13/21 1240            Contact information for after-discharge care     Destination     [facility] .   Service: Skilled Nursing Contact information: [address] [place] [address] [phone]                    Allergies  Allergen Reactions   Codeine Nausea Only and Other (See Comments)   Fentanyl Other (See Comments)    Given before knee surgery, blood pressure became extremely low   Regadenoson Other (See Comments)    Created cardiac arrest   Verapamil Other (See Comments)    Per patient: Medication has caused low heart rate and caused patient to have afib that he currently has   Acetaminophen Other (See Comments)    Pt states he feels hot when he takes acetaminophen    Consultations: None   Procedures/Studies: DG Chest 1 View  Result Date: 09/06/2021 CLINICAL DATA:  Acute respiratory failure. EXAM: CHEST  1 VIEW COMPARISON:  Chest radiograph dated 09/05/2021. FINDINGS: There is cardiomegaly with mild central vascular congestion. No focal consolidation, pleural effusion or pneumothorax. No acute osseous pathology. IMPRESSION: Cardiomegaly with mild central vascular congestion. Electronically Signed   By: Anner Crete M.D.   On: 09/06/2021 02:21   DG Chest 2 View  Result Date: 08/16/2021 CLINICAL DATA:  81 year old male with a history of syncope EXAM: CHEST - 2 VIEW COMPARISON:  02/17/2019 FINDINGS: The cardiac diameter appears enlarged compared to the prior, with more globular configuration and straightening of the left heart border. Stigmata of emphysema, with increased retrosternal airspace, flattened hemidiaphragms, increased AP diameter, and hyperinflation on the AP view. Hazy opacity at the bilateral lung bases with partial obscuration of the hemidiaphragms and the heart borders. Coarsened interstitial markings bilaterally. No pneumothorax.  Degenerative changes of the spine.  No acute displaced fracture IMPRESSION: Increased diameter of the heart with a configuration suspicious for pericardial effusion. Correlation with either chest CT or cardiac echo may be considered. Opacities at the bilateral lung bases, likely a combination of pleural effusion and associated atelectasis/consolidation. Coarsened interstitial markings, potentially early pulmonary edema Electronically Signed   By: Corrie Mckusick D.O.   On: 08/16/2021 15:09   DG Tibia/Fibula Left  Result Date: 08/16/2021 CLINICAL DATA:  Pain after fall. EXAM: LEFT TIBIA AND FIBULA - 2 VIEW; LEFT FOOT - COMPLETE 3+ VIEW COMPARISON:  X-ray left tibia fibula  and left femur 01/30/2019 FINDINGS: Right foot: No evidence of fracture, dislocation, or joint effusion. No evidence of severe arthropathy. No aggressive appearing focal bone abnormality. Subcutaneus soft tissue edema. Right tibia fibula: Acute laterally displaced fracture of the transsyndesmotic distal fibula. Acute laterally displaced medial malleolar fracture. No definite dislocation of the left ankle. Cortical irregularity of the intra-articular anterior tibia. No acute displaced fracture or dislocation of the proximal tibia fibula. The knee is grossly unremarkable. Total left knee arthroplasty. Associated subcutaneus soft tissue edema. IMPRESSION: 1. Acute laterally displaced fracture of the transsyndesmotic distal fibula. 2. Acute laterally displaced medial malleolar fracture. 3. Cortical irregularity of the intra-articular anterior tibia. Recommend dedicated left ankle radiograph. 4. No acute displaced fracture or dislocation of the proximal tibia fibula in the setting of a total left knee arthroplasty. 5. No acute displaced fracture or dislocation of the bones of the left foot. Electronically Signed   By: Iven Finn M.D.   On: 08/16/2021 15:11   DG Ankle Complete Left  Result Date: 08/19/2021 CLINICAL DATA:  Postoperative, ORIF  EXAM: LEFT ANKLE COMPLETE - 3+ VIEW COMPARISON:  08/16/2021 FINDINGS: Status post plate and screw fixation of the distal left fibula and screw fixation of the medial malleolus with anatomic alignment of fractures. Soft tissue edema about the foot and ankle. Cast material about the foot and ankle. IMPRESSION: Status post plate and screw fixation of the distal left fibula and screw fixation of the medial malleolus with anatomic alignment of fractures. Electronically Signed   By: Delanna Ahmadi M.D.   On: 08/19/2021 13:52   DG Ankle Complete Left  Result Date: 08/16/2021 CLINICAL DATA:  Radiologist recommended dedicated ankle film to further evaluate injury EXAM: LEFT ANKLE COMPLETE - 3+ VIEW COMPARISON:  Same day radiographs. FINDINGS: Redemonstrated acute laterally displaced oblique fracture of the distal fibula involving the syndesmosis. Redemonstrated acute laterally displaced medial malleolar fracture. Similar ill-defined cortical regularity of the intra-articular anterior tibia. Possible small joint effusion. Vascular calcifications. Calcaneal enthesophytes. IMPRESSION: 1. Redemonstrated acute laterally displaced oblique fracture of the distal fibula involving the syndesmosis. 2. Redemonstrated acute laterally displaced medial malleolar fracture. 3. Similar ill-defined cortical regularity of the intra-articular anterior tibia, possibly representing fracture given trauma and small effusion. CT of the ankle could confirm and better evaluate if clinically indicated. Electronically Signed   By: Margaretha Sheffield M.D.   On: 08/16/2021 16:20   CT HEAD WO CONTRAST (5MM)  Result Date: 09/05/2021 CLINICAL DATA:  Mental status change, unknown cause EXAM: CT HEAD WITHOUT CONTRAST TECHNIQUE: Contiguous axial images were obtained from the base of the skull through the vertex without intravenous contrast. COMPARISON:  08/16/2021 FINDINGS: Brain: There is atrophy and chronic small vessel disease changes. No acute  intracranial abnormality. Specifically, no hemorrhage, hydrocephalus, mass lesion, acute infarction, or significant intracranial injury. Vascular: No hyperdense vessel or unexpected calcification. Skull: No acute calvarial abnormality. Sinuses/Orbits: No acute findings Other: None IMPRESSION: Atrophy, chronic microvascular disease. No acute intracranial abnormality. Electronically Signed   By: Rolm Baptise M.D.   On: 09/05/2021 23:29   CT Head Wo Contrast  Result Date: 08/16/2021 CLINICAL DATA:  Fall, head trauma EXAM: CT HEAD WITHOUT CONTRAST TECHNIQUE: Contiguous axial images were obtained from the base of the skull through the vertex without intravenous contrast. COMPARISON:  CT head 08/06/2019 FINDINGS: Brain: No acute intracranial hemorrhage, mass effect, or herniation. No extra-axial fluid collections. No evidence of acute territorial infarct. No hydrocephalus. Mild-to-moderate cortical volume loss. Patchy hypodensities in the periventricular and subcortical  white matter, likely secondary to chronic microvascular ischemic changes. Vascular: Calcified plaques in the carotid siphons. Skull: Normal. Negative for fracture or focal lesion. Sinuses/Orbits: No acute finding. Other: None. IMPRESSION: Chronic changes with no acute intracranial process identified. Electronically Signed   By: Ofilia Neas M.D.   On: 08/16/2021 14:41   CT Angio Chest PE W and/or Wo Contrast  Result Date: 08/16/2021 CLINICAL DATA:  Witnessed fall, dizziness, anemia EXAM: CT ANGIOGRAPHY CHEST CT ABDOMEN AND PELVIS WITH CONTRAST TECHNIQUE: Multidetector CT imaging of the chest was performed using the standard protocol during bolus administration of intravenous contrast. Multiplanar CT image reconstructions and MIPs were obtained to evaluate the vascular anatomy. Multidetector CT imaging of the abdomen and pelvis was performed using the standard protocol during bolus administration of intravenous contrast. CONTRAST:  153mL  OMNIPAQUE IOHEXOL 350 MG/ML SOLN COMPARISON:  08/16/2021 FINDINGS: CTA CHEST FINDINGS Cardiovascular: This is a technically adequate evaluation of the pulmonary vasculature. No filling defects or pulmonary emboli. The heart is enlarged, with prominent biatrial dilation. No pericardial effusion. Extensive atherosclerosis of the coronary vasculature. No evidence of thoracic aortic aneurysm or dissection. Mild aortic atherosclerosis. Mediastinum/Nodes: No enlarged mediastinal, hilar, or axillary lymph nodes. Thyroid gland, trachea, and esophagus demonstrate no significant findings. Lungs/Pleura: No airspace disease, effusion, or pneumothorax. Central airways are patent. Musculoskeletal: No acute displaced fractures. Reconstructed images demonstrate no additional findings. Review of the MIP images confirms the above findings. CT ABDOMEN and PELVIS FINDINGS Hepatobiliary: No focal liver abnormality is seen. No gallstones, gallbladder wall thickening, or biliary dilatation. Pancreas: Unremarkable. No pancreatic ductal dilatation or surrounding inflammatory changes. Spleen: Normal in size without focal abnormality. 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 Mild nonspecific thickening of the left adrenal gland measuring up to 1.4 cm. 4 mm nonobstructing calculus mid right kidney. 2 mm nonobstructing calculus lower pole left kidney. There is mild bilateral renal cortical thinning. No obstructive uropathy. The bladder is unremarkable. Stomach/Bowel: No bowel obstruction or ileus. Normal appendix right lower quadrant. No bowel wall thickening or inflammatory change. Vascular/Lymphatic: Aortic atherosclerosis. No enlarged abdominal or pelvic lymph nodes. Reproductive: Prostate is unremarkable. Other: No free fluid or free gas.  No abdominal wall hernia. Musculoskeletal: No acute displaced fractures. Postsurgical changes at L4-5. Reconstructed images demonstrate no additional findings. Review of the MIP images  confirms the above findings. IMPRESSION: 1. No evidence of pulmonary embolus. 2. Cardiomegaly, with no evidence of pericardial effusion. 3. No acute intrathoracic, intra-abdominal, or intrapelvic trauma. 4. Punctate bilateral nonobstructing renal calculi. 5.  Aortic Atherosclerosis (ICD10-I70.0). Electronically Signed   By: Randa Ngo M.D.   On: 08/16/2021 16:04   CT Cervical Spine Wo Contrast  Result Date: 08/16/2021 CLINICAL DATA:  Neck pain EXAM: CT CERVICAL SPINE WITHOUT CONTRAST TECHNIQUE: Multidetector CT imaging of the cervical spine was performed without intravenous contrast. Multiplanar CT image reconstructions were also generated. COMPARISON:  MRI cervical spine 08/18/2014 FINDINGS: Alignment: Grade 1 anterolisthesis of C3 on C4 and C4 on C5. Skull base and vertebrae: No acute fracture. No primary bone lesion or focal pathologic process. Soft tissues and spinal canal: No prevertebral fluid or swelling. No visible canal hematoma. Disc levels: Moderate intervertebral disc height loss at C5-C6 and mild-to-moderate at C6-C7. Dorsal endplate osteophytes and uncovertebral spurring most prominent at C5-C6. Facet arthropathy which is worse on the right. Multilevel neural foraminal narrowing most significant at C5-C6. Upper chest: No acute process identified. Other: None. IMPRESSION: Chronic degenerative changes with no acute fracture or subluxation identified. Electronically Signed  By: Ofilia Neas M.D.   On: 08/16/2021 14:44   CT Abdomen Pelvis W Contrast  Result Date: 09/05/2021 CLINICAL DATA:  Sepsis, recent urinary catheter removal, nausea/vomiting/diarrhea, febrile EXAM: CT ABDOMEN AND PELVIS WITH CONTRAST TECHNIQUE: Multidetector CT imaging of the abdomen and pelvis was performed using the standard protocol following bolus administration of intravenous contrast. CONTRAST:  128mL OMNIPAQUE IOHEXOL 300 MG/ML  SOLN COMPARISON:  08/16/2021 FINDINGS: Lower chest: Stable cardiomegaly. No acute  pleural or parenchymal lung disease. Hepatobiliary: No focal liver abnormality is seen. No gallstones, gallbladder wall thickening, or biliary dilatation. Pancreas: Unremarkable. No pancreatic ductal dilatation or surrounding inflammatory changes. Spleen: Normal in size without focal abnormality. Adrenals/Urinary Tract: Stable right adrenal myelolipoma. Mild nodular thickening of the left adrenal gland is unchanged. There is subtle heterogeneous decreased enhancement within the ventral aspect of the upper pole right kidney, concerning for focal pyelonephritis. No evidence of renal abscess. Left kidney enhances normally. Stable punctate bilateral nonobstructing renal calculi. No obstructive uropathy within either kidney. The bladder is decompressed, with nonspecific bladder wall thickening. Mild perivesicular fat stranding suggests cystitis. Stomach/Bowel: No bowel obstruction or ileus. No bowel wall thickening or inflammatory change. Vascular/Lymphatic: Aortic atherosclerosis. No enlarged abdominal or pelvic lymph nodes. Reproductive: Prostate is unremarkable. Other: No free fluid or free gas.  No abdominal wall hernia. Musculoskeletal: No acute or destructive bony lesions. Stable postsurgical changes lower lumbar spine. Reconstructed images demonstrate no additional findings. IMPRESSION: 1. Bladder wall thickening and perivesicular fat stranding compatible with cystitis. 2. Subtle heterogeneous decreased enhancement ventral aspect upper pole right kidney, concerning for focal pyelonephritis. No renal abscess. 3. Stable bilateral nonobstructing renal calculi. 4.  Aortic Atherosclerosis (ICD10-I70.0). Electronically Signed   By: Randa Ngo M.D.   On: 09/05/2021 18:01   CT Abdomen Pelvis W Contrast  Result Date: 08/16/2021 CLINICAL DATA:  Witnessed fall, dizziness, anemia EXAM: CT ANGIOGRAPHY CHEST CT ABDOMEN AND PELVIS WITH CONTRAST TECHNIQUE: Multidetector CT imaging of the chest was performed using the  standard protocol during bolus administration of intravenous contrast. Multiplanar CT image reconstructions and MIPs were obtained to evaluate the vascular anatomy. Multidetector CT imaging of the abdomen and pelvis was performed using the standard protocol during bolus administration of intravenous contrast. CONTRAST:  153mL OMNIPAQUE IOHEXOL 350 MG/ML SOLN COMPARISON:  08/16/2021 FINDINGS: CTA CHEST FINDINGS Cardiovascular: This is a technically adequate evaluation of the pulmonary vasculature. No filling defects or pulmonary emboli. The heart is enlarged, with prominent biatrial dilation. No pericardial effusion. Extensive atherosclerosis of the coronary vasculature. No evidence of thoracic aortic aneurysm or dissection. Mild aortic atherosclerosis. Mediastinum/Nodes: No enlarged mediastinal, hilar, or axillary lymph nodes. Thyroid gland, trachea, and esophagus demonstrate no significant findings. Lungs/Pleura: No airspace disease, effusion, or pneumothorax. Central airways are patent. Musculoskeletal: No acute displaced fractures. Reconstructed images demonstrate no additional findings. Review of the MIP images confirms the above findings. CT ABDOMEN and PELVIS FINDINGS Hepatobiliary: No focal liver abnormality is seen. No gallstones, gallbladder wall thickening, or biliary dilatation. Pancreas: Unremarkable. No pancreatic ductal dilatation or surrounding inflammatory changes. Spleen: Normal in size without focal abnormality. 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 Mild nonspecific thickening of the left adrenal gland measuring up to 1.4 cm. 4 mm nonobstructing calculus mid right kidney. 2 mm nonobstructing calculus lower pole left kidney. There is mild bilateral renal cortical thinning. No obstructive uropathy. The bladder is unremarkable. Stomach/Bowel: No bowel obstruction or ileus. Normal appendix right lower quadrant. No bowel wall thickening or inflammatory change. Vascular/Lymphatic:  Aortic atherosclerosis. No  enlarged abdominal or pelvic lymph nodes. Reproductive: Prostate is unremarkable. Other: No free fluid or free gas.  No abdominal wall hernia. Musculoskeletal: No acute displaced fractures. Postsurgical changes at L4-5. Reconstructed images demonstrate no additional findings. Review of the MIP images confirms the above findings. IMPRESSION: 1. No evidence of pulmonary embolus. 2. Cardiomegaly, with no evidence of pericardial effusion. 3. No acute intrathoracic, intra-abdominal, or intrapelvic trauma. 4. Punctate bilateral nonobstructing renal calculi. 5.  Aortic Atherosclerosis (ICD10-I70.0). Electronically Signed   By: Randa Ngo M.D.   On: 08/16/2021 16:04   DG Chest Port 1 View  Result Date: 09/10/2021 CLINICAL DATA:  Chest pain EXAM: PORTABLE CHEST 1 VIEW COMPARISON:  Chest x-ray 09/06/2021 FINDINGS: Heart is enlarged. Mediastinum appears stable. Pulmonary vasculature is within normal limits. No focal consolidation identified. No pleural effusion or pneumothorax visualized. IMPRESSION: Cardiomegaly with no acute process identified. Electronically Signed   By: Ofilia Neas M.D.   On: 09/10/2021 09:41   DG Chest Portable 1 View  Result Date: 09/05/2021 CLINICAL DATA:  Sepsis, onset of nausea, vomiting, diarrhea and urinary pain 2 hours following urinary catheter removal on Friday, fever to 103 degrees EXAM: PORTABLE CHEST 1 VIEW COMPARISON:  Portable exam 1631 hours compared to 08/16/2021 FINDINGS: Rotated to the LEFT. Borderline enlargement of cardiac silhouette. Mediastinal contours and pulmonary vascularity normal. Lungs clear. No pulmonary infiltrate, pleural effusion, or pneumothorax. No acute osseous findings. IMPRESSION: No acute abnormalities. Electronically Signed   By: Lavonia Dana M.D.   On: 09/05/2021 16:50   DG Foot Complete Left  Result Date: 08/16/2021 CLINICAL DATA:  Pain after fall. EXAM: LEFT TIBIA AND FIBULA - 2 VIEW; LEFT FOOT - COMPLETE 3+ VIEW  COMPARISON:  X-ray left tibia fibula and left femur 01/30/2019 FINDINGS: Right foot: No evidence of fracture, dislocation, or joint effusion. No evidence of severe arthropathy. No aggressive appearing focal bone abnormality. Subcutaneus soft tissue edema. Right tibia fibula: Acute laterally displaced fracture of the transsyndesmotic distal fibula. Acute laterally displaced medial malleolar fracture. No definite dislocation of the left ankle. Cortical irregularity of the intra-articular anterior tibia. No acute displaced fracture or dislocation of the proximal tibia fibula. The knee is grossly unremarkable. Total left knee arthroplasty. Associated subcutaneus soft tissue edema. IMPRESSION: 1. Acute laterally displaced fracture of the transsyndesmotic distal fibula. 2. Acute laterally displaced medial malleolar fracture. 3. Cortical irregularity of the intra-articular anterior tibia. Recommend dedicated left ankle radiograph. 4. No acute displaced fracture or dislocation of the proximal tibia fibula in the setting of a total left knee arthroplasty. 5. No acute displaced fracture or dislocation of the bones of the left foot. Electronically Signed   By: Iven Finn M.D.   On: 08/16/2021 15:11   DG MINI C-ARM IMAGE ONLY  Result Date: 08/19/2021 There is no interpretation for this exam.  This order is for images obtained during a surgical procedure.  Please See "Surgeries" Tab for more information regarding the procedure.   ECHOCARDIOGRAM COMPLETE  Result Date: 09/07/2021    ECHOCARDIOGRAM REPORT   Patient Name:   [name] Date of Exam: 09/06/2021 Medical Rec #:  [id number]         Height:       72.0 in Accession #:    [id number]        Weight:       215.0 lb Date of Birth:  Aug 20, 1941         BSA:          2.197 m Patient  Age:    80 years          BP:           144/90 mmHg Patient Gender: M                 HR:           76 bpm. Exam Location:  [facility] Procedure: 2D Echo, Cardiac Doppler and Color  Doppler Indications:     Elevated Troponin  History:         Patient has prior history of Echocardiogram examinations, most                  recent 02/17/2019. Stroke, Arrythmias:Atrial Fibrillation; Risk                  Factors:Hypertension and Dyslipidemia.  Sonographer:     Daphine Deutscher RDCS Referring Phys:  [id number] AMY N COX Diagnosing Phys: Debbe Odea MD IMPRESSIONS  1. Left ventricular ejection fraction, by estimation, is 50 to 55%. The left ventricle has low normal function. The left ventricle has no regional wall motion abnormalities. There is mild left ventricular hypertrophy. Left ventricular diastolic parameters are indeterminate.  2. Right ventricular systolic function is normal. The right ventricular size is normal.  3. Left atrial size was mildly dilated.  4. Right atrial size was mildly dilated.  5. The mitral valve is normal in structure. Mild mitral valve regurgitation.  6. The aortic valve is tricuspid. Aortic valve regurgitation is mild. Aortic valve sclerosis/calcification is present, without any evidence of aortic stenosis.  7. Aortic dilatation noted. There is mild dilatation of the ascending aorta, measuring 39 mm.  8. The inferior vena cava is dilated in size with <50% respiratory variability, suggesting right atrial pressure of 15 mmHg. FINDINGS  Left Ventricle: Left ventricular ejection fraction, by estimation, is 50 to 55%. The left ventricle has low normal function. The left ventricle has no regional wall motion abnormalities. The left ventricular internal cavity size was normal in size. There is mild left ventricular hypertrophy. Left ventricular diastolic parameters are indeterminate. Right Ventricle: The right ventricular size is normal. No increase in right ventricular wall thickness. Right ventricular systolic function is normal. Left Atrium: Left atrial size was mildly dilated. Right Atrium: Right atrial size was mildly dilated. Pericardium: There is no evidence of  pericardial effusion. Mitral Valve: The mitral valve is normal in structure. Mild mitral valve regurgitation. Tricuspid Valve: The tricuspid valve is normal in structure. Tricuspid valve regurgitation is mild. Aortic Valve: The aortic valve is tricuspid. Aortic valve regurgitation is mild. Aortic regurgitation PHT measures 325 msec. Aortic valve sclerosis/calcification is present, without any evidence of aortic stenosis. Pulmonic Valve: The pulmonic valve was normal in structure. Pulmonic valve regurgitation is not visualized. Aorta: Aortic dilatation noted. There is mild dilatation of the ascending aorta, measuring 39 mm. Venous: The inferior vena cava is dilated in size with less than 50% respiratory variability, suggesting right atrial pressure of 15 mmHg. IAS/Shunts: No atrial level shunt detected by color flow Doppler.  LEFT VENTRICLE PLAX 2D LVIDd:         3.90 cm LVIDs:         2.85 cm LV PW:         1.40 cm LV IVS:        1.80 cm LVOT diam:     2.10 cm LV SV:         72 LV SV Index:   33 LVOT Area:  3.46 cm  RIGHT VENTRICLE            IVC RV Basal diam:  5.00 cm    IVC diam: 2.50 cm RV S prime:     7.69 cm/s TAPSE (M-mode): 1.8 cm LEFT ATRIUM             Index        RIGHT ATRIUM           Index LA diam:        5.80 cm 2.64 cm/m   RA Area:     22.00 cm LA Vol (A2C):   94.0 ml 42.78 ml/m  RA Volume:   77.50 ml  35.27 ml/m LA Vol (A4C):   69.4 ml 31.59 ml/m LA Biplane Vol: 86.6 ml 39.42 ml/m  AORTIC VALVE LVOT Vmax:   115.00 cm/s LVOT Vmean:  90.900 cm/s LVOT VTI:    0.207 m AI PHT:      325 msec  AORTA Ao Root diam: 3.90 cm Ao Asc diam:  3.90 cm MV E velocity: 91.70 cm/s  TRICUSPID VALVE                            TR Peak grad:   39.9 mmHg                            TR Vmax:        316.00 cm/s                             SHUNTS                            Systemic VTI:  0.21 m                            Systemic Diam: 2.10 cm Kate Sable MD Electronically signed by Kate Sable MD  Signature Date/Time: 09/07/2021/3:21:50 PM    Final    DG HIP UNILAT WITH PELVIS 2-3 VIEWS LEFT  Result Date: 09/07/2021 CLINICAL DATA:  Left hip pain.  No known injury. EXAM: DG HIP (WITH OR WITHOUT PELVIS) 2-3V LEFT COMPARISON:  None. FINDINGS: No evidence of joint space narrowing. No fracture or focal lesion. Other bones of the pelvis appear normal. Prior lumbar fusion. IMPRESSION: Negative pelvis and left hip. No evidence of degenerative or traumatic finding. Electronically Signed   By: Nelson Chimes M.D.   On: 09/07/2021 11:38      Subjective: Seen and examined the day of discharge.  Mentating at baseline.  Stable for discharge home.  Discharge Exam: Vitals:   09/13/21 0444 09/13/21 0815  BP: (!) 159/86 (!) 163/99  Pulse: 85 81  Resp: 16 16  Temp: 98.6 F (37 C) 98.1 F (36.7 C)  SpO2: 93% 93%   Vitals:   09/12/21 1550 09/12/21 2053 09/13/21 0444 09/13/21 0815  BP: (!) 149/89 (!) 165/83 (!) 159/86 (!) 163/99  Pulse: 82 79 85 81  Resp: $Remo'18 16 16 16  'jbptP$ Temp: 99 F (37.2 C) 98.9 F (37.2 C) 98.6 F (37 C) 98.1 F (36.7 C)  TempSrc:  Oral Oral Oral  SpO2: 97% 96% 93% 93%  Weight:      Height:        General: Pt is alert, awake,  not in acute distress Cardiovascular: RRR, S1/S2 +, no rubs, no gallops Respiratory: CTA bilaterally, no wheezing, no rhonchi Abdominal: Soft, NT, ND, bowel sounds + Extremities: no edema, no cyanosis    The results of significant diagnostics from this hospitalization (including imaging, microbiology, ancillary and laboratory) are listed below for reference.     Microbiology: Recent Results (from the past 240 hour(s))  Culture, blood (Routine x 2)     Status: Abnormal   Collection Time: 09/05/21  3:20 PM   Specimen: BLOOD  Result Value Ref Range Status   Specimen Description   Final    BLOOD LEFT ANTECUBITAL Performed at [facility], [address] [place], [place] [address]    Special Requests   Final    BOTTLES DRAWN  AEROBIC AND ANAEROBIC Blood Culture adequate volume Performed at [facility], [address], [place], [place] [address]    Culture  Setup Time   Final    GRAM NEGATIVE RODS ANAEROBIC BOTTLE ONLY CRITICAL RESULT CALLED TO, READ BACK BY AND VERIFIED WITH: Virl Cagey $RemoveBe'@2146'[id number]$  on 09/06/21 skl    Culture ESCHERICHIA COLI (A)  Final   Report Status 09/09/2021 FINAL  Final   Organism ID, Bacteria ESCHERICHIA COLI  Final      Susceptibility   Escherichia coli - MIC*    AMPICILLIN 4 SENSITIVE Sensitive     CEFAZOLIN <=4 SENSITIVE Sensitive     CEFEPIME <=0.12 SENSITIVE Sensitive     CEFTAZIDIME <=1 SENSITIVE Sensitive     CEFTRIAXONE <=0.25 SENSITIVE Sensitive     CIPROFLOXACIN <=0.25 SENSITIVE Sensitive     GENTAMICIN <=1 SENSITIVE Sensitive     IMIPENEM <=0.25 SENSITIVE Sensitive     TRIMETH/SULFA <=20 SENSITIVE Sensitive     AMPICILLIN/SULBACTAM <=2 SENSITIVE Sensitive     PIP/TAZO <=4 SENSITIVE Sensitive     * ESCHERICHIA COLI  Culture, blood (Routine x 2)     Status: None   Collection Time: 09/05/21  3:20 PM   Specimen: BLOOD  Result Value Ref Range Status   Specimen Description BLOOD BLOOD LEFT ARM  Final   Special Requests   Final    BOTTLES DRAWN AEROBIC AND ANAEROBIC Blood Culture adequate volume   Culture   Final    NO GROWTH 5 DAYS Performed at [facility], [address] [place], [place] [address]    Report Status 09/10/2021 FINAL  Final  Blood Culture ID Panel (Reflexed)     Status: Abnormal   Collection Time: 09/05/21  3:20 PM  Result Value Ref Range Status   Enterococcus faecalis NOT DETECTED NOT DETECTED Final   Enterococcus Faecium NOT DETECTED NOT DETECTED Final   Listeria monocytogenes NOT DETECTED NOT DETECTED Final   Staphylococcus species NOT DETECTED NOT DETECTED Final   Staphylococcus aureus (BCID) NOT DETECTED NOT DETECTED Final   Staphylococcus epidermidis NOT DETECTED NOT DETECTED Final   Staphylococcus lugdunensis NOT DETECTED  NOT DETECTED Final   Streptococcus species NOT DETECTED NOT DETECTED Final   Streptococcus agalactiae NOT DETECTED NOT DETECTED Final   Streptococcus pneumoniae NOT DETECTED NOT DETECTED Final   Streptococcus pyogenes NOT DETECTED NOT DETECTED Final   A.calcoaceticus-baumannii NOT DETECTED NOT DETECTED Final   Bacteroides fragilis NOT DETECTED NOT DETECTED Final   Enterobacterales DETECTED (A) NOT DETECTED Final    Comment: Enterobacterales represent a large order of gram negative bacteria, not a single organism. CRITICAL RESULT CALLED TO, READ BACK BY AND VERIFIED WITH: Virl Cagey $RemoveBe'@2146'[email or web address]$  on 09/06/21 skl  Enterobacter cloacae complex NOT DETECTED NOT DETECTED Final   Escherichia coli DETECTED (A) NOT DETECTED Final    Comment: CRITICAL RESULT CALLED TO, READ BACK BY AND VERIFIED WITH: Virl Cagey $RemoveBe'@2146'[email or web address]$  on 09/06/21 skl    Klebsiella aerogenes NOT DETECTED NOT DETECTED Final   Klebsiella oxytoca NOT DETECTED NOT DETECTED Final   Klebsiella pneumoniae NOT DETECTED NOT DETECTED Final   Proteus species NOT DETECTED NOT DETECTED Final   Salmonella species NOT DETECTED NOT DETECTED Final   Serratia marcescens NOT DETECTED NOT DETECTED Final   Haemophilus influenzae NOT DETECTED NOT DETECTED Final   Neisseria meningitidis NOT DETECTED NOT DETECTED Final   Pseudomonas aeruginosa NOT DETECTED NOT DETECTED Final   Stenotrophomonas maltophilia NOT DETECTED NOT DETECTED Final   Candida albicans NOT DETECTED NOT DETECTED Final   Candida auris NOT DETECTED NOT DETECTED Final   Candida glabrata NOT DETECTED NOT DETECTED Final   Candida krusei NOT DETECTED NOT DETECTED Final   Candida parapsilosis NOT DETECTED NOT DETECTED Final   Candida tropicalis NOT DETECTED NOT DETECTED Final   Cryptococcus neoformans/gattii NOT DETECTED NOT DETECTED Final   CTX-M ESBL NOT DETECTED NOT DETECTED Final   Carbapenem resistance IMP NOT DETECTED NOT DETECTED Final   Carbapenem resistance KPC NOT DETECTED NOT  DETECTED Final   Carbapenem resistance NDM NOT DETECTED NOT DETECTED Final   Carbapenem resist OXA 48 LIKE NOT DETECTED NOT DETECTED Final   Carbapenem resistance VIM NOT DETECTED NOT DETECTED Final    Comment: Performed at [facility], [address] [place], [place] [address]  Resp Panel by RT-PCR (Flu A&B, Covid) Nasopharyngeal Swab     Status: None   Collection Time: 09/05/21  3:21 PM   Specimen: Nasopharyngeal Swab; Nasopharyngeal(NP) swabs in vial transport medium  Result Value Ref Range Status   SARS Coronavirus 2 by RT PCR NEGATIVE NEGATIVE Final    Comment: (NOTE) SARS-CoV-2 target nucleic acids are NOT DETECTED.  The SARS-CoV-2 RNA is generally detectable in upper respiratory specimens during the acute phase of infection. The lowest concentration of SARS-CoV-2 viral copies this assay can detect is 138 copies/mL. A negative result does not preclude SARS-Cov-2 infection and should not be used as the sole basis for treatment or other patient management decisions. A negative result may occur with  improper specimen collection/handling, submission of specimen other than nasopharyngeal swab, presence of viral mutation(s) within the areas targeted by this assay, and inadequate number of viral copies(<138 copies/mL). A negative result must be combined with clinical observations, patient history, and epidemiological information. The expected result is Negative.  Fact Sheet for Patients:  EntrepreneurPulse.com.au  Fact Sheet for Healthcare Providers:  IncredibleEmployment.be  This test is no t yet approved or cleared by the [place] FDA and  has been authorized for detection and/or diagnosis of SARS-CoV-2 by FDA under an Emergency Use Authorization (EUA). This EUA will remain  in effect (meaning this test can be used) for the duration of the COVID-19 declaration under Section 564(b)(1) of the Act, 21 U.S.C.section  360bbb-3(b)(1), unless the authorization is terminated  or revoked sooner.       Influenza A by PCR NEGATIVE NEGATIVE Final   Influenza B by PCR NEGATIVE NEGATIVE Final    Comment: (NOTE) The Xpert Xpress SARS-CoV-2/FLU/RSV plus assay is intended as an aid in the diagnosis of influenza from Nasopharyngeal swab specimens and should not be used as a sole basis for treatment. Nasal washings and aspirates are unacceptable for Xpert Xpress SARS-CoV-2/FLU/RSV testing.  Fact Sheet for Patients: EntrepreneurPulse.com.au  Fact Sheet for Healthcare Providers: IncredibleEmployment.be  This test is not yet approved or cleared by the [place] FDA and has been authorized for detection and/or diagnosis of SARS-CoV-2 by FDA under an Emergency Use Authorization (EUA). This EUA will remain in effect (meaning this test can be used) for the duration of the COVID-19 declaration under Section 564(b)(1) of the Act, 21 U.S.C. section 360bbb-3(b)(1), unless the authorization is terminated or revoked.  Performed at [facility], [address] [place], [place] [address]   Urine Culture     Status: None   Collection Time: 09/06/21  2:25 AM   Specimen: Urine, Random  Result Value Ref Range Status   Specimen Description   Final    URINE, RANDOM Performed at [facility], [address] [place], [place] [address]    Special Requests   Final    NONE Performed at [facility], [address], [place], [place] [address]    Culture   Final    NO GROWTH Performed at [facility], [place] [address] [place], [place] [address]    Report Status 09/07/2021 FINAL  Final  Gastrointestinal Panel by PCR , Stool     Status: None   Collection Time: 09/12/21 12:04 AM   Specimen: Stool  Result Value Ref Range Status   Campylobacter species NOT DETECTED NOT DETECTED Final   Plesimonas shigelloides NOT DETECTED NOT DETECTED Final    Salmonella species NOT DETECTED NOT DETECTED Final   Yersinia enterocolitica NOT DETECTED NOT DETECTED Final   Vibrio species NOT DETECTED NOT DETECTED Final   Vibrio cholerae NOT DETECTED NOT DETECTED Final   Enteroaggregative E coli (EAEC) NOT DETECTED NOT DETECTED Final   Enteropathogenic E coli (EPEC) NOT DETECTED NOT DETECTED Final   Enterotoxigenic E coli (ETEC) NOT DETECTED NOT DETECTED Final   Shiga like toxin producing E coli (STEC) NOT DETECTED NOT DETECTED Final   Shigella/Enteroinvasive E coli (EIEC) NOT DETECTED NOT DETECTED Final   Cryptosporidium NOT DETECTED NOT DETECTED Final   Cyclospora cayetanensis NOT DETECTED NOT DETECTED Final   Entamoeba histolytica NOT DETECTED NOT DETECTED Final   Giardia lamblia NOT DETECTED NOT DETECTED Final   Adenovirus F40/41 NOT DETECTED NOT DETECTED Final   Astrovirus NOT DETECTED NOT DETECTED Final   Norovirus GI/GII NOT DETECTED NOT DETECTED Final   Rotavirus A NOT DETECTED NOT DETECTED Final   Sapovirus (I, II, IV, and V) NOT DETECTED NOT DETECTED Final    Comment: Performed at [facility], [address] [place], [place] [address]     Labs: BNP (last 3 results) Recent Labs    09/05/21 1551 09/06/21 0225  BNP 426.2* 384.6*   Basic Metabolic Panel: Recent Labs  Lab 09/06/21 1401 09/07/21 0701 09/09/21 0753 09/10/21 1013 09/12/21 0825  NA 132*  --  134* 131* 135  K 3.7  --  4.6 3.2* 3.6  CL 103  --  107 102 101  CO2 25  --  $R'23 22 28  'sk$ GLUCOSE 103*  --  110* 161* 111*  BUN 29*  --  31* 24* 13  CREATININE 1.14 1.14 1.00 1.07 0.68  CALCIUM 7.8*  --  8.5* 8.1* 8.3*  MG  --   --   --  1.8  --    Liver Function Tests: No results for input(s): AST, ALT, ALKPHOS, BILITOT, PROT, ALBUMIN in the last 168 hours. No results for input(s): LIPASE, AMYLASE in the  last 168 hours. No results for input(s): AMMONIA in the last 168 hours. CBC: Recent Labs  Lab 09/07/21 0701 09/08/21 0249 09/09/21 0753 09/10/21 1013  09/12/21 0825  WBC 19.1* 13.4* 10.7* 10.9* 9.3  NEUTROABS  --   --  8.1* 8.5* 6.5  HGB 8.9* 8.9* 9.4* 10.3* 10.4*  HCT 28.9* 28.9* 31.0* 32.9* 33.3*  MCV 78.1* 78.3* 79.1* 77.0* 76.2*  PLT 150 158 159 182 223   Cardiac Enzymes: No results for input(s): CKTOTAL, CKMB, CKMBINDEX, TROPONINI in the last 168 hours. BNP: Invalid input(s): POCBNP CBG: No results for input(s): GLUCAP in the last 168 hours. D-Dimer No results for input(s): DDIMER in the last 72 hours. Hgb A1c No results for input(s): HGBA1C in the last 72 hours. Lipid Profile No results for input(s): CHOL, HDL, LDLCALC, TRIG, CHOLHDL, LDLDIRECT in the last 72 hours. Thyroid function studies No results for input(s): TSH, T4TOTAL, T3FREE, THYROIDAB in the last 72 hours.  Invalid input(s): FREET3 Anemia work up No results for input(s): VITAMINB12, FOLATE, FERRITIN, TIBC, IRON, RETICCTPCT in the last 72 hours. Urinalysis    Component Value Date/Time   COLORURINE YELLOW (A) 09/06/2021 0225   APPEARANCEUR CLOUDY (A) 09/06/2021 0225   LABSPEC >1.046 (H) 09/06/2021 0225   PHURINE 5.0 09/06/2021 0225   GLUCOSEU NEGATIVE 09/06/2021 0225   HGBUR LARGE (A) 09/06/2021 0225   BILIRUBINUR NEGATIVE 09/06/2021 0225   KETONESUR 5 (A) 09/06/2021 0225   PROTEINUR 100 (A) 09/06/2021 0225   NITRITE NEGATIVE 09/06/2021 0225   LEUKOCYTESUR LARGE (A) 09/06/2021 0225   Sepsis Labs Invalid input(s): PROCALCITONIN,  WBC,  LACTICIDVEN Microbiology Recent Results (from the past 240 hour(s))  Culture, blood (Routine x 2)     Status: Abnormal   Collection Time: 09/05/21  3:20 PM   Specimen: BLOOD  Result Value Ref Range Status   Specimen Description   Final    BLOOD LEFT ANTECUBITAL Performed at [facility], [address], [place], [place] [address]    Special Requests   Final    BOTTLES DRAWN AEROBIC AND ANAEROBIC Blood Culture adequate volume Performed at [facility], [address] [place], [place]  [address]    Culture  Setup Time   Final    GRAM NEGATIVE RODS ANAEROBIC BOTTLE ONLY CRITICAL RESULT CALLED TO, READ BACK BY AND VERIFIED WITH: Virl Cagey $RemoveBe'[email or web address]'[id number]$  on 09/06/21 skl    Culture ESCHERICHIA COLI (A)  Final   Report Status 09/09/2021 FINAL  Final   Organism ID, Bacteria ESCHERICHIA COLI  Final      Susceptibility   Escherichia coli - MIC*    AMPICILLIN 4 SENSITIVE Sensitive     CEFAZOLIN <=4 SENSITIVE Sensitive     CEFEPIME <=0.12 SENSITIVE Sensitive     CEFTAZIDIME <=1 SENSITIVE Sensitive     CEFTRIAXONE <=0.25 SENSITIVE Sensitive     CIPROFLOXACIN <=0.25 SENSITIVE Sensitive     GENTAMICIN <=1 SENSITIVE Sensitive     IMIPENEM <=0.25 SENSITIVE Sensitive     TRIMETH/SULFA <=20 SENSITIVE Sensitive     AMPICILLIN/SULBACTAM <=2 SENSITIVE Sensitive     PIP/TAZO <=4 SENSITIVE Sensitive     * ESCHERICHIA COLI  Culture, blood (Routine x 2)     Status: None   Collection Time: 09/05/21  3:20 PM   Specimen: BLOOD  Result Value Ref Range Status   Specimen Description BLOOD BLOOD LEFT ARM  Final   Special Requests   Final    BOTTLES DRAWN AEROBIC AND ANAEROBIC Blood Culture adequate volume  Culture   Final    NO GROWTH 5 DAYS Performed at [facility], [address] [place], [place] [address]    Report Status 09/10/2021 FINAL  Final  Blood Culture ID Panel (Reflexed)     Status: Abnormal   Collection Time: 09/05/21  3:20 PM  Result Value Ref Range Status   Enterococcus faecalis NOT DETECTED NOT DETECTED Final   Enterococcus Faecium NOT DETECTED NOT DETECTED Final   Listeria monocytogenes NOT DETECTED NOT DETECTED Final   Staphylococcus species NOT DETECTED NOT DETECTED Final   Staphylococcus aureus (BCID) NOT DETECTED NOT DETECTED Final   Staphylococcus epidermidis NOT DETECTED NOT DETECTED Final   Staphylococcus lugdunensis NOT DETECTED NOT DETECTED Final   Streptococcus species NOT DETECTED NOT DETECTED Final   Streptococcus agalactiae NOT DETECTED NOT  DETECTED Final   Streptococcus pneumoniae NOT DETECTED NOT DETECTED Final   Streptococcus pyogenes NOT DETECTED NOT DETECTED Final   A.calcoaceticus-baumannii NOT DETECTED NOT DETECTED Final   Bacteroides fragilis NOT DETECTED NOT DETECTED Final   Enterobacterales DETECTED (A) NOT DETECTED Final    Comment: Enterobacterales represent a large order of gram negative bacteria, not a single organism. CRITICAL RESULT CALLED TO, READ BACK BY AND VERIFIED WITH: Virl Cagey $RemoveBe'@2146'[email or web address]$  on 09/06/21 skl    Enterobacter cloacae complex NOT DETECTED NOT DETECTED Final   Escherichia coli DETECTED (A) NOT DETECTED Final    Comment: CRITICAL RESULT CALLED TO, READ BACK BY AND VERIFIED WITH: Virl Cagey $RemoveBe'@2146'[id number]$  on 09/06/21 skl    Klebsiella aerogenes NOT DETECTED NOT DETECTED Final   Klebsiella oxytoca NOT DETECTED NOT DETECTED Final   Klebsiella pneumoniae NOT DETECTED NOT DETECTED Final   Proteus species NOT DETECTED NOT DETECTED Final   Salmonella species NOT DETECTED NOT DETECTED Final   Serratia marcescens NOT DETECTED NOT DETECTED Final   Haemophilus influenzae NOT DETECTED NOT DETECTED Final   Neisseria meningitidis NOT DETECTED NOT DETECTED Final   Pseudomonas aeruginosa NOT DETECTED NOT DETECTED Final   Stenotrophomonas maltophilia NOT DETECTED NOT DETECTED Final   Candida albicans NOT DETECTED NOT DETECTED Final   Candida auris NOT DETECTED NOT DETECTED Final   Candida glabrata NOT DETECTED NOT DETECTED Final   Candida krusei NOT DETECTED NOT DETECTED Final   Candida parapsilosis NOT DETECTED NOT DETECTED Final   Candida tropicalis NOT DETECTED NOT DETECTED Final   Cryptococcus neoformans/gattii NOT DETECTED NOT DETECTED Final   CTX-M ESBL NOT DETECTED NOT DETECTED Final   Carbapenem resistance IMP NOT DETECTED NOT DETECTED Final   Carbapenem resistance KPC NOT DETECTED NOT DETECTED Final   Carbapenem resistance NDM NOT DETECTED NOT DETECTED Final   Carbapenem resist OXA 48 LIKE NOT  DETECTED NOT DETECTED Final   Carbapenem resistance VIM NOT DETECTED NOT DETECTED Final    Comment: Performed at [facility], [address] [place], [place] [address]  Resp Panel by RT-PCR (Flu A&B, Covid) Nasopharyngeal Swab     Status: None   Collection Time: 09/05/21  3:21 PM   Specimen: Nasopharyngeal Swab; Nasopharyngeal(NP) swabs in vial transport medium  Result Value Ref Range Status   SARS Coronavirus 2 by RT PCR NEGATIVE NEGATIVE Final    Comment: (NOTE) SARS-CoV-2 target nucleic acids are NOT DETECTED.  The SARS-CoV-2 RNA is generally detectable in upper respiratory specimens during the acute phase of infection. The lowest concentration of SARS-CoV-2 viral copies this assay can detect is 138 copies/mL. A negative result does not preclude SARS-Cov-2 infection and should not be used as the sole  basis for treatment or other patient management decisions. A negative result may occur with  improper specimen collection/handling, submission of specimen other than nasopharyngeal swab, presence of viral mutation(s) within the areas targeted by this assay, and inadequate number of viral copies(<138 copies/mL). A negative result must be combined with clinical observations, patient history, and epidemiological information. The expected result is Negative.  Fact Sheet for Patients:  EntrepreneurPulse.com.au  Fact Sheet for Healthcare Providers:  IncredibleEmployment.be  This test is no t yet approved or cleared by the [place] FDA and  has been authorized for detection and/or diagnosis of SARS-CoV-2 by FDA under an Emergency Use Authorization (EUA). This EUA will remain  in effect (meaning this test can be used) for the duration of the COVID-19 declaration under Section 564(b)(1) of the Act, 21 U.S.C.section 360bbb-3(b)(1), unless the authorization is terminated  or revoked sooner.       Influenza A by PCR NEGATIVE NEGATIVE  Final   Influenza B by PCR NEGATIVE NEGATIVE Final    Comment: (NOTE) The Xpert Xpress SARS-CoV-2/FLU/RSV plus assay is intended as an aid in the diagnosis of influenza from Nasopharyngeal swab specimens and should not be used as a sole basis for treatment. Nasal washings and aspirates are unacceptable for Xpert Xpress SARS-CoV-2/FLU/RSV testing.  Fact Sheet for Patients: EntrepreneurPulse.com.au  Fact Sheet for Healthcare Providers: IncredibleEmployment.be  This test is not yet approved or cleared by the [place] FDA and has been authorized for detection and/or diagnosis of SARS-CoV-2 by FDA under an Emergency Use Authorization (EUA). This EUA will remain in effect (meaning this test can be used) for the duration of the COVID-19 declaration under Section 564(b)(1) of the Act, 21 U.S.C. section 360bbb-3(b)(1), unless the authorization is terminated or revoked.  Performed at [facility], [address], [place], [place] [address]   Urine Culture     Status: None   Collection Time: 09/06/21  2:25 AM   Specimen: Urine, Random  Result Value Ref Range Status   Specimen Description   Final    URINE, RANDOM Performed at [facility], [address], [place], [place] [address]    Special Requests   Final    NONE Performed at [facility], [address] [place], [place] [address]    Culture   Final    NO GROWTH Performed at [facility], [place] [address] [place], [place] [address]    Report Status 09/07/2021 FINAL  Final  Gastrointestinal Panel by PCR , Stool     Status: None   Collection Time: 09/12/21 12:04 AM   Specimen: Stool  Result Value Ref Range Status   Campylobacter species NOT DETECTED NOT DETECTED Final   Plesimonas shigelloides NOT DETECTED NOT DETECTED Final   Salmonella species NOT DETECTED NOT DETECTED Final   Yersinia enterocolitica NOT DETECTED NOT DETECTED Final   Vibrio species  NOT DETECTED NOT DETECTED Final   Vibrio cholerae NOT DETECTED NOT DETECTED Final   Enteroaggregative E coli (EAEC) NOT DETECTED NOT DETECTED Final   Enteropathogenic E coli (EPEC) NOT DETECTED NOT DETECTED Final   Enterotoxigenic E coli (ETEC) NOT DETECTED NOT DETECTED Final   Shiga like toxin producing E coli (STEC) NOT DETECTED NOT DETECTED Final   Shigella/Enteroinvasive E coli (EIEC) NOT DETECTED NOT DETECTED Final   Cryptosporidium NOT DETECTED NOT DETECTED Final   Cyclospora cayetanensis NOT DETECTED NOT DETECTED Final   Entamoeba histolytica NOT DETECTED NOT DETECTED Final   Giardia lamblia NOT DETECTED NOT  DETECTED Final   Adenovirus F40/41 NOT DETECTED NOT DETECTED Final   Astrovirus NOT DETECTED NOT DETECTED Final   Norovirus GI/GII NOT DETECTED NOT DETECTED Final   Rotavirus A NOT DETECTED NOT DETECTED Final   Sapovirus (I, II, IV, and V) NOT DETECTED NOT DETECTED Final    Comment: Performed at [facility], [address], [place], [place] [address]     Time coordinating discharge: Over 30 minutes  SIGNED:   Sidney Ace, MD  [facility] 09/13/2021, 1:44 PM Pager   If 7PM-7AM, please contact night-coverage

## 2021-09-13 NOTE — Clinical Social Work Note (Cosign Needed)
°  °  Durable Medical Equipment  (From admission, onward)           Start     Ordered   09/13/21 1241  For home use only DME standard manual wheelchair with seat cushion  Once       Comments: Patient suffers from weakness, functional decline which impairs their ability to perform daily activities like bathing, dressing, feeding, grooming, and toileting in the home.  A cane, crutch, or walker will not resolve issue with performing activities of daily living. A wheelchair will allow patient to safely perform daily activities. Patient can safely propel the wheelchair in the home or has a caregiver who can provide assistance. Length of need Lifetime. Accessories: elevating leg rests (ELRs), wheel locks, extensions and anti-tippers. Needs removable arms   09/13/21 1240   09/13/21 0849  For home use only DME Hospital bed  Once       Question Answer Comment  Length of Need 12 Months   The above medical condition requires: Patient requires the ability to reposition frequently   Bed type Semi-electric      09/13/21 0848

## 2021-09-15 DIAGNOSIS — B9620 Unspecified Escherichia coli [E. coli] as the cause of diseases classified elsewhere: Secondary | ICD-10-CM | POA: Diagnosis not present

## 2021-09-15 DIAGNOSIS — D649 Anemia, unspecified: Secondary | ICD-10-CM | POA: Diagnosis not present

## 2021-09-15 DIAGNOSIS — G8929 Other chronic pain: Secondary | ICD-10-CM | POA: Diagnosis not present

## 2021-09-15 DIAGNOSIS — I69344 Monoplegia of lower limb following cerebral infarction affecting left non-dominant side: Secondary | ICD-10-CM | POA: Diagnosis not present

## 2021-09-15 DIAGNOSIS — M50322 Other cervical disc degeneration at C5-C6 level: Secondary | ICD-10-CM | POA: Diagnosis not present

## 2021-09-15 DIAGNOSIS — I480 Paroxysmal atrial fibrillation: Secondary | ICD-10-CM | POA: Diagnosis not present

## 2021-09-15 DIAGNOSIS — W19XXXD Unspecified fall, subsequent encounter: Secondary | ICD-10-CM | POA: Diagnosis not present

## 2021-09-15 DIAGNOSIS — I82402 Acute embolism and thrombosis of unspecified deep veins of left lower extremity: Secondary | ICD-10-CM | POA: Diagnosis not present

## 2021-09-15 DIAGNOSIS — F0390 Unspecified dementia without behavioral disturbance: Secondary | ICD-10-CM | POA: Diagnosis not present

## 2021-09-15 DIAGNOSIS — N3090 Cystitis, unspecified without hematuria: Secondary | ICD-10-CM | POA: Diagnosis not present

## 2021-09-15 DIAGNOSIS — G40909 Epilepsy, unspecified, not intractable, without status epilepticus: Secondary | ICD-10-CM | POA: Diagnosis not present

## 2021-09-15 DIAGNOSIS — S82432D Displaced oblique fracture of shaft of left fibula, subsequent encounter for closed fracture with routine healing: Secondary | ICD-10-CM | POA: Diagnosis not present

## 2021-09-15 DIAGNOSIS — I10 Essential (primary) hypertension: Secondary | ICD-10-CM | POA: Diagnosis not present

## 2021-09-15 DIAGNOSIS — M5020 Other cervical disc displacement, unspecified cervical region: Secondary | ICD-10-CM | POA: Diagnosis not present

## 2021-09-15 DIAGNOSIS — N179 Acute kidney failure, unspecified: Secondary | ICD-10-CM | POA: Diagnosis not present

## 2021-09-15 DIAGNOSIS — N10 Acute tubulo-interstitial nephritis: Secondary | ICD-10-CM | POA: Diagnosis not present

## 2021-09-30 DIAGNOSIS — S82852A Displaced trimalleolar fracture of left lower leg, initial encounter for closed fracture: Secondary | ICD-10-CM | POA: Diagnosis not present

## 2021-10-03 DIAGNOSIS — N3090 Cystitis, unspecified without hematuria: Secondary | ICD-10-CM | POA: Diagnosis not present

## 2021-10-03 DIAGNOSIS — B9620 Unspecified Escherichia coli [E. coli] as the cause of diseases classified elsewhere: Secondary | ICD-10-CM | POA: Diagnosis not present

## 2021-10-03 DIAGNOSIS — I10 Essential (primary) hypertension: Secondary | ICD-10-CM | POA: Diagnosis not present

## 2021-10-03 DIAGNOSIS — N390 Urinary tract infection, site not specified: Secondary | ICD-10-CM | POA: Diagnosis not present

## 2021-10-03 DIAGNOSIS — N10 Acute tubulo-interstitial nephritis: Secondary | ICD-10-CM | POA: Diagnosis not present

## 2021-10-14 DIAGNOSIS — N390 Urinary tract infection, site not specified: Secondary | ICD-10-CM | POA: Diagnosis not present

## 2021-10-14 DIAGNOSIS — A419 Sepsis, unspecified organism: Secondary | ICD-10-CM | POA: Diagnosis not present

## 2021-10-14 DIAGNOSIS — N179 Acute kidney failure, unspecified: Secondary | ICD-10-CM | POA: Diagnosis not present

## 2021-10-14 DIAGNOSIS — R6520 Severe sepsis without septic shock: Secondary | ICD-10-CM | POA: Diagnosis not present

## 2021-10-15 DIAGNOSIS — N179 Acute kidney failure, unspecified: Secondary | ICD-10-CM | POA: Diagnosis not present

## 2021-10-15 DIAGNOSIS — I69344 Monoplegia of lower limb following cerebral infarction affecting left non-dominant side: Secondary | ICD-10-CM | POA: Diagnosis not present

## 2021-10-15 DIAGNOSIS — D649 Anemia, unspecified: Secondary | ICD-10-CM | POA: Diagnosis not present

## 2021-10-15 DIAGNOSIS — F0390 Unspecified dementia without behavioral disturbance: Secondary | ICD-10-CM | POA: Diagnosis not present

## 2021-10-15 DIAGNOSIS — G40909 Epilepsy, unspecified, not intractable, without status epilepticus: Secondary | ICD-10-CM | POA: Diagnosis not present

## 2021-10-15 DIAGNOSIS — B9620 Unspecified Escherichia coli [E. coli] as the cause of diseases classified elsewhere: Secondary | ICD-10-CM | POA: Diagnosis not present

## 2021-10-15 DIAGNOSIS — I10 Essential (primary) hypertension: Secondary | ICD-10-CM | POA: Diagnosis not present

## 2021-10-15 DIAGNOSIS — S82432D Displaced oblique fracture of shaft of left fibula, subsequent encounter for closed fracture with routine healing: Secondary | ICD-10-CM | POA: Diagnosis not present

## 2021-10-15 DIAGNOSIS — M5020 Other cervical disc displacement, unspecified cervical region: Secondary | ICD-10-CM | POA: Diagnosis not present

## 2021-10-15 DIAGNOSIS — N10 Acute tubulo-interstitial nephritis: Secondary | ICD-10-CM | POA: Diagnosis not present

## 2021-10-15 DIAGNOSIS — G8929 Other chronic pain: Secondary | ICD-10-CM | POA: Diagnosis not present

## 2021-10-15 DIAGNOSIS — I82402 Acute embolism and thrombosis of unspecified deep veins of left lower extremity: Secondary | ICD-10-CM | POA: Diagnosis not present

## 2021-10-15 DIAGNOSIS — W19XXXD Unspecified fall, subsequent encounter: Secondary | ICD-10-CM | POA: Diagnosis not present

## 2021-10-15 DIAGNOSIS — N3090 Cystitis, unspecified without hematuria: Secondary | ICD-10-CM | POA: Diagnosis not present

## 2021-10-15 DIAGNOSIS — M50322 Other cervical disc degeneration at C5-C6 level: Secondary | ICD-10-CM | POA: Diagnosis not present

## 2021-10-15 DIAGNOSIS — I480 Paroxysmal atrial fibrillation: Secondary | ICD-10-CM | POA: Diagnosis not present

## 2021-10-28 DIAGNOSIS — S82852A Displaced trimalleolar fracture of left lower leg, initial encounter for closed fracture: Secondary | ICD-10-CM | POA: Diagnosis not present

## 2021-10-28 DIAGNOSIS — L03116 Cellulitis of left lower limb: Secondary | ICD-10-CM | POA: Diagnosis not present

## 2021-11-14 DIAGNOSIS — G40909 Epilepsy, unspecified, not intractable, without status epilepticus: Secondary | ICD-10-CM | POA: Diagnosis not present

## 2021-11-14 DIAGNOSIS — F0280 Dementia in other diseases classified elsewhere without behavioral disturbance: Secondary | ICD-10-CM | POA: Diagnosis not present

## 2021-11-14 DIAGNOSIS — N390 Urinary tract infection, site not specified: Secondary | ICD-10-CM | POA: Diagnosis not present

## 2021-11-14 DIAGNOSIS — S82432D Displaced oblique fracture of shaft of left fibula, subsequent encounter for closed fracture with routine healing: Secondary | ICD-10-CM | POA: Diagnosis not present

## 2021-11-14 DIAGNOSIS — G8929 Other chronic pain: Secondary | ICD-10-CM | POA: Diagnosis not present

## 2021-11-14 DIAGNOSIS — M25552 Pain in left hip: Secondary | ICD-10-CM | POA: Diagnosis not present

## 2021-11-14 DIAGNOSIS — M5020 Other cervical disc displacement, unspecified cervical region: Secondary | ICD-10-CM | POA: Diagnosis not present

## 2021-11-14 DIAGNOSIS — W19XXXD Unspecified fall, subsequent encounter: Secondary | ICD-10-CM | POA: Diagnosis not present

## 2021-11-14 DIAGNOSIS — I080 Rheumatic disorders of both mitral and aortic valves: Secondary | ICD-10-CM | POA: Diagnosis not present

## 2021-11-14 DIAGNOSIS — I10 Essential (primary) hypertension: Secondary | ICD-10-CM | POA: Diagnosis not present

## 2021-11-14 DIAGNOSIS — L89626 Pressure-induced deep tissue damage of left heel: Secondary | ICD-10-CM | POA: Diagnosis not present

## 2021-11-14 DIAGNOSIS — A419 Sepsis, unspecified organism: Secondary | ICD-10-CM | POA: Diagnosis not present

## 2021-11-14 DIAGNOSIS — R6520 Severe sepsis without septic shock: Secondary | ICD-10-CM | POA: Diagnosis not present

## 2021-11-14 DIAGNOSIS — T8131XD Disruption of external operation (surgical) wound, not elsewhere classified, subsequent encounter: Secondary | ICD-10-CM | POA: Diagnosis not present

## 2021-11-14 DIAGNOSIS — M50322 Other cervical disc degeneration at C5-C6 level: Secondary | ICD-10-CM | POA: Diagnosis not present

## 2021-11-14 DIAGNOSIS — M159 Polyosteoarthritis, unspecified: Secondary | ICD-10-CM | POA: Diagnosis not present

## 2021-11-14 DIAGNOSIS — N179 Acute kidney failure, unspecified: Secondary | ICD-10-CM | POA: Diagnosis not present

## 2021-11-14 DIAGNOSIS — D649 Anemia, unspecified: Secondary | ICD-10-CM | POA: Diagnosis not present

## 2021-11-14 DIAGNOSIS — I69344 Monoplegia of lower limb following cerebral infarction affecting left non-dominant side: Secondary | ICD-10-CM | POA: Diagnosis not present

## 2021-11-14 DIAGNOSIS — I480 Paroxysmal atrial fibrillation: Secondary | ICD-10-CM | POA: Diagnosis not present

## 2021-11-18 DIAGNOSIS — L03116 Cellulitis of left lower limb: Secondary | ICD-10-CM | POA: Diagnosis not present

## 2021-11-29 DIAGNOSIS — T8131XD Disruption of external operation (surgical) wound, not elsewhere classified, subsequent encounter: Secondary | ICD-10-CM | POA: Diagnosis not present

## 2021-11-29 DIAGNOSIS — W19XXXD Unspecified fall, subsequent encounter: Secondary | ICD-10-CM | POA: Diagnosis not present

## 2021-11-29 DIAGNOSIS — L89626 Pressure-induced deep tissue damage of left heel: Secondary | ICD-10-CM | POA: Diagnosis not present

## 2021-11-29 DIAGNOSIS — S82432D Displaced oblique fracture of shaft of left fibula, subsequent encounter for closed fracture with routine healing: Secondary | ICD-10-CM | POA: Diagnosis not present

## 2021-12-02 DIAGNOSIS — S82842D Displaced bimalleolar fracture of left lower leg, subsequent encounter for closed fracture with routine healing: Secondary | ICD-10-CM | POA: Diagnosis not present

## 2021-12-02 DIAGNOSIS — L03116 Cellulitis of left lower limb: Secondary | ICD-10-CM | POA: Diagnosis not present

## 2021-12-02 DIAGNOSIS — T8130XD Disruption of wound, unspecified, subsequent encounter: Secondary | ICD-10-CM | POA: Diagnosis not present

## 2021-12-14 DIAGNOSIS — I10 Essential (primary) hypertension: Secondary | ICD-10-CM | POA: Diagnosis not present

## 2021-12-14 DIAGNOSIS — M5020 Other cervical disc displacement, unspecified cervical region: Secondary | ICD-10-CM | POA: Diagnosis not present

## 2021-12-14 DIAGNOSIS — W19XXXD Unspecified fall, subsequent encounter: Secondary | ICD-10-CM | POA: Diagnosis not present

## 2021-12-14 DIAGNOSIS — F0280 Dementia in other diseases classified elsewhere without behavioral disturbance: Secondary | ICD-10-CM | POA: Diagnosis not present

## 2021-12-14 DIAGNOSIS — G40909 Epilepsy, unspecified, not intractable, without status epilepticus: Secondary | ICD-10-CM | POA: Diagnosis not present

## 2021-12-14 DIAGNOSIS — I480 Paroxysmal atrial fibrillation: Secondary | ICD-10-CM | POA: Diagnosis not present

## 2021-12-14 DIAGNOSIS — G8929 Other chronic pain: Secondary | ICD-10-CM | POA: Diagnosis not present

## 2021-12-14 DIAGNOSIS — M159 Polyosteoarthritis, unspecified: Secondary | ICD-10-CM | POA: Diagnosis not present

## 2021-12-14 DIAGNOSIS — T8131XD Disruption of external operation (surgical) wound, not elsewhere classified, subsequent encounter: Secondary | ICD-10-CM | POA: Diagnosis not present

## 2021-12-14 DIAGNOSIS — S82432D Displaced oblique fracture of shaft of left fibula, subsequent encounter for closed fracture with routine healing: Secondary | ICD-10-CM | POA: Diagnosis not present

## 2021-12-14 DIAGNOSIS — M25552 Pain in left hip: Secondary | ICD-10-CM | POA: Diagnosis not present

## 2021-12-14 DIAGNOSIS — I080 Rheumatic disorders of both mitral and aortic valves: Secondary | ICD-10-CM | POA: Diagnosis not present

## 2021-12-14 DIAGNOSIS — L89626 Pressure-induced deep tissue damage of left heel: Secondary | ICD-10-CM | POA: Diagnosis not present

## 2021-12-14 DIAGNOSIS — I69344 Monoplegia of lower limb following cerebral infarction affecting left non-dominant side: Secondary | ICD-10-CM | POA: Diagnosis not present

## 2021-12-14 DIAGNOSIS — M50322 Other cervical disc degeneration at C5-C6 level: Secondary | ICD-10-CM | POA: Diagnosis not present

## 2021-12-14 DIAGNOSIS — D649 Anemia, unspecified: Secondary | ICD-10-CM | POA: Diagnosis not present

## 2022-01-05 DIAGNOSIS — E785 Hyperlipidemia, unspecified: Secondary | ICD-10-CM | POA: Diagnosis not present

## 2022-01-05 DIAGNOSIS — I10 Essential (primary) hypertension: Secondary | ICD-10-CM | POA: Diagnosis not present

## 2022-01-05 DIAGNOSIS — Z125 Encounter for screening for malignant neoplasm of prostate: Secondary | ICD-10-CM | POA: Diagnosis not present

## 2022-01-05 DIAGNOSIS — R569 Unspecified convulsions: Secondary | ICD-10-CM | POA: Diagnosis not present

## 2022-01-13 DIAGNOSIS — T8131XD Disruption of external operation (surgical) wound, not elsewhere classified, subsequent encounter: Secondary | ICD-10-CM | POA: Diagnosis not present

## 2022-01-13 DIAGNOSIS — M159 Polyosteoarthritis, unspecified: Secondary | ICD-10-CM | POA: Diagnosis not present

## 2022-01-13 DIAGNOSIS — F0280 Dementia in other diseases classified elsewhere without behavioral disturbance: Secondary | ICD-10-CM | POA: Diagnosis not present

## 2022-01-13 DIAGNOSIS — G40909 Epilepsy, unspecified, not intractable, without status epilepticus: Secondary | ICD-10-CM | POA: Diagnosis not present

## 2022-01-13 DIAGNOSIS — I10 Essential (primary) hypertension: Secondary | ICD-10-CM | POA: Diagnosis not present

## 2022-01-13 DIAGNOSIS — G8929 Other chronic pain: Secondary | ICD-10-CM | POA: Diagnosis not present

## 2022-01-13 DIAGNOSIS — I480 Paroxysmal atrial fibrillation: Secondary | ICD-10-CM | POA: Diagnosis not present

## 2022-01-13 DIAGNOSIS — S82432D Displaced oblique fracture of shaft of left fibula, subsequent encounter for closed fracture with routine healing: Secondary | ICD-10-CM | POA: Diagnosis not present

## 2022-01-13 DIAGNOSIS — D649 Anemia, unspecified: Secondary | ICD-10-CM | POA: Diagnosis not present

## 2022-01-13 DIAGNOSIS — I080 Rheumatic disorders of both mitral and aortic valves: Secondary | ICD-10-CM | POA: Diagnosis not present

## 2022-01-13 DIAGNOSIS — M50322 Other cervical disc degeneration at C5-C6 level: Secondary | ICD-10-CM | POA: Diagnosis not present

## 2022-01-13 DIAGNOSIS — W19XXXD Unspecified fall, subsequent encounter: Secondary | ICD-10-CM | POA: Diagnosis not present

## 2022-01-13 DIAGNOSIS — I69344 Monoplegia of lower limb following cerebral infarction affecting left non-dominant side: Secondary | ICD-10-CM | POA: Diagnosis not present

## 2022-01-13 DIAGNOSIS — M5020 Other cervical disc displacement, unspecified cervical region: Secondary | ICD-10-CM | POA: Diagnosis not present

## 2022-01-13 DIAGNOSIS — M964 Postsurgical lordosis: Secondary | ICD-10-CM | POA: Diagnosis not present

## 2022-01-13 DIAGNOSIS — M25552 Pain in left hip: Secondary | ICD-10-CM | POA: Diagnosis not present

## 2022-02-12 DIAGNOSIS — I69344 Monoplegia of lower limb following cerebral infarction affecting left non-dominant side: Secondary | ICD-10-CM | POA: Diagnosis not present

## 2022-02-12 DIAGNOSIS — I10 Essential (primary) hypertension: Secondary | ICD-10-CM | POA: Diagnosis not present

## 2022-02-12 DIAGNOSIS — G40909 Epilepsy, unspecified, not intractable, without status epilepticus: Secondary | ICD-10-CM | POA: Diagnosis not present

## 2022-02-12 DIAGNOSIS — I080 Rheumatic disorders of both mitral and aortic valves: Secondary | ICD-10-CM | POA: Diagnosis not present

## 2022-02-12 DIAGNOSIS — G8929 Other chronic pain: Secondary | ICD-10-CM | POA: Diagnosis not present

## 2022-02-12 DIAGNOSIS — W19XXXD Unspecified fall, subsequent encounter: Secondary | ICD-10-CM | POA: Diagnosis not present

## 2022-02-12 DIAGNOSIS — M25552 Pain in left hip: Secondary | ICD-10-CM | POA: Diagnosis not present

## 2022-02-12 DIAGNOSIS — M964 Postsurgical lordosis: Secondary | ICD-10-CM | POA: Diagnosis not present

## 2022-02-12 DIAGNOSIS — M5020 Other cervical disc displacement, unspecified cervical region: Secondary | ICD-10-CM | POA: Diagnosis not present

## 2022-02-12 DIAGNOSIS — S82432D Displaced oblique fracture of shaft of left fibula, subsequent encounter for closed fracture with routine healing: Secondary | ICD-10-CM | POA: Diagnosis not present

## 2022-02-12 DIAGNOSIS — D649 Anemia, unspecified: Secondary | ICD-10-CM | POA: Diagnosis not present

## 2022-02-12 DIAGNOSIS — M159 Polyosteoarthritis, unspecified: Secondary | ICD-10-CM | POA: Diagnosis not present

## 2022-02-12 DIAGNOSIS — I480 Paroxysmal atrial fibrillation: Secondary | ICD-10-CM | POA: Diagnosis not present

## 2022-02-12 DIAGNOSIS — M50322 Other cervical disc degeneration at C5-C6 level: Secondary | ICD-10-CM | POA: Diagnosis not present

## 2022-02-12 DIAGNOSIS — F0280 Dementia in other diseases classified elsewhere without behavioral disturbance: Secondary | ICD-10-CM | POA: Diagnosis not present

## 2022-02-12 DIAGNOSIS — T8131XD Disruption of external operation (surgical) wound, not elsewhere classified, subsequent encounter: Secondary | ICD-10-CM | POA: Diagnosis not present

## 2022-02-17 DIAGNOSIS — L03116 Cellulitis of left lower limb: Secondary | ICD-10-CM | POA: Diagnosis not present

## 2022-03-16 DIAGNOSIS — W19XXXD Unspecified fall, subsequent encounter: Secondary | ICD-10-CM | POA: Diagnosis not present

## 2022-03-16 DIAGNOSIS — I69344 Monoplegia of lower limb following cerebral infarction affecting left non-dominant side: Secondary | ICD-10-CM | POA: Diagnosis not present

## 2022-03-16 DIAGNOSIS — S82432D Displaced oblique fracture of shaft of left fibula, subsequent encounter for closed fracture with routine healing: Secondary | ICD-10-CM | POA: Diagnosis not present

## 2022-03-16 DIAGNOSIS — T8131XD Disruption of external operation (surgical) wound, not elsewhere classified, subsequent encounter: Secondary | ICD-10-CM | POA: Diagnosis not present

## 2022-04-19 DIAGNOSIS — I5032 Chronic diastolic (congestive) heart failure: Secondary | ICD-10-CM | POA: Diagnosis not present

## 2022-04-19 DIAGNOSIS — D649 Anemia, unspecified: Secondary | ICD-10-CM | POA: Diagnosis not present

## 2022-04-19 DIAGNOSIS — Z0000 Encounter for general adult medical examination without abnormal findings: Secondary | ICD-10-CM | POA: Diagnosis not present

## 2022-04-19 DIAGNOSIS — I110 Hypertensive heart disease with heart failure: Secondary | ICD-10-CM | POA: Diagnosis not present

## 2022-04-19 DIAGNOSIS — R5381 Other malaise: Secondary | ICD-10-CM | POA: Diagnosis not present

## 2022-04-19 DIAGNOSIS — M62838 Other muscle spasm: Secondary | ICD-10-CM | POA: Diagnosis not present

## 2022-04-19 DIAGNOSIS — I4821 Permanent atrial fibrillation: Secondary | ICD-10-CM | POA: Diagnosis not present

## 2022-04-19 DIAGNOSIS — Z79899 Other long term (current) drug therapy: Secondary | ICD-10-CM | POA: Diagnosis not present

## 2022-05-02 DIAGNOSIS — I639 Cerebral infarction, unspecified: Secondary | ICD-10-CM | POA: Diagnosis not present

## 2022-05-02 DIAGNOSIS — I4821 Permanent atrial fibrillation: Secondary | ICD-10-CM | POA: Diagnosis not present

## 2022-05-02 DIAGNOSIS — I824Y9 Acute embolism and thrombosis of unspecified deep veins of unspecified proximal lower extremity: Secondary | ICD-10-CM | POA: Diagnosis not present

## 2022-05-02 DIAGNOSIS — I10 Essential (primary) hypertension: Secondary | ICD-10-CM | POA: Diagnosis not present

## 2022-05-03 DIAGNOSIS — L600 Ingrowing nail: Secondary | ICD-10-CM | POA: Diagnosis not present

## 2022-05-03 DIAGNOSIS — L97322 Non-pressure chronic ulcer of left ankle with fat layer exposed: Secondary | ICD-10-CM | POA: Diagnosis not present

## 2022-05-05 ENCOUNTER — Other Ambulatory Visit: Payer: Self-pay | Admitting: Orthopedic Surgery

## 2022-05-05 DIAGNOSIS — T8130XA Disruption of wound, unspecified, initial encounter: Secondary | ICD-10-CM | POA: Diagnosis not present

## 2022-05-05 DIAGNOSIS — Z01818 Encounter for other preprocedural examination: Secondary | ICD-10-CM

## 2022-05-08 MED ORDER — CHLORHEXIDINE GLUCONATE CLOTH 2 % EX PADS: 6.0000 | MEDICATED_PAD | Freq: Once | CUTANEOUS | Status: DC

## 2022-05-08 MED ORDER — CEFAZOLIN SODIUM-DEXTROSE 2-4 GM/100ML-% IV SOLN
2.0000 g | INTRAVENOUS | Status: AC
  Administered 2022-05-09: 2 g via INTRAVENOUS

## 2022-05-09 ENCOUNTER — Ambulatory Visit
Admission: RE | Admit: 2022-05-09 | Discharge: 2022-05-09 | Disposition: A | Discharge status: Home / Self Care | Payer: Medicare Other | Attending: Internal Medicine | Admitting: Internal Medicine

## 2022-05-09 ENCOUNTER — Ambulatory Visit: Payer: Medicare Other | Admitting: Anesthesiology

## 2022-05-09 ENCOUNTER — Encounter
Admission: RE | Disposition: A | Discharge status: Home / Self Care | Payer: Self-pay | Source: Home / Self Care | Attending: Internal Medicine

## 2022-05-09 ENCOUNTER — Observation Stay: Payer: Medicare Other

## 2022-05-09 ENCOUNTER — Other Ambulatory Visit: Payer: Self-pay

## 2022-05-09 ENCOUNTER — Encounter: Payer: Self-pay | Admitting: Orthopedic Surgery

## 2022-05-09 DIAGNOSIS — G629 Polyneuropathy, unspecified: Secondary | ICD-10-CM | POA: Diagnosis not present

## 2022-05-09 DIAGNOSIS — T8130XA Disruption of wound, unspecified, initial encounter: Secondary | ICD-10-CM | POA: Diagnosis not present

## 2022-05-09 DIAGNOSIS — Z8674 Personal history of sudden cardiac arrest: Secondary | ICD-10-CM | POA: Insufficient documentation

## 2022-05-09 DIAGNOSIS — K922 Gastrointestinal hemorrhage, unspecified: Secondary | ICD-10-CM | POA: Diagnosis present

## 2022-05-09 DIAGNOSIS — I4821 Permanent atrial fibrillation: Secondary | ICD-10-CM | POA: Diagnosis not present

## 2022-05-09 DIAGNOSIS — I10 Essential (primary) hypertension: Secondary | ICD-10-CM | POA: Diagnosis not present

## 2022-05-09 DIAGNOSIS — R601 Generalized edema: Secondary | ICD-10-CM | POA: Diagnosis not present

## 2022-05-09 DIAGNOSIS — Z8673 Personal history of transient ischemic attack (TIA), and cerebral infarction without residual deficits: Secondary | ICD-10-CM | POA: Diagnosis not present

## 2022-05-09 DIAGNOSIS — R569 Unspecified convulsions: Secondary | ICD-10-CM | POA: Insufficient documentation

## 2022-05-09 DIAGNOSIS — X58XXXA Exposure to other specified factors, initial encounter: Secondary | ICD-10-CM | POA: Diagnosis not present

## 2022-05-09 DIAGNOSIS — T84197A Other mechanical complication of internal fixation device of bone of left lower leg, initial encounter: Secondary | ICD-10-CM | POA: Diagnosis not present

## 2022-05-09 DIAGNOSIS — Z8781 Personal history of (healed) traumatic fracture: Secondary | ICD-10-CM | POA: Diagnosis not present

## 2022-05-09 DIAGNOSIS — R7309 Other abnormal glucose: Secondary | ICD-10-CM

## 2022-05-09 DIAGNOSIS — K219 Gastro-esophageal reflux disease without esophagitis: Secondary | ICD-10-CM | POA: Insufficient documentation

## 2022-05-09 DIAGNOSIS — N182 Chronic kidney disease, stage 2 (mild): Secondary | ICD-10-CM | POA: Insufficient documentation

## 2022-05-09 DIAGNOSIS — Z86718 Personal history of other venous thrombosis and embolism: Secondary | ICD-10-CM | POA: Diagnosis not present

## 2022-05-09 DIAGNOSIS — Z7901 Long term (current) use of anticoagulants: Secondary | ICD-10-CM | POA: Insufficient documentation

## 2022-05-09 DIAGNOSIS — Z01818 Encounter for other preprocedural examination: Secondary | ICD-10-CM

## 2022-05-09 DIAGNOSIS — I639 Cerebral infarction, unspecified: Secondary | ICD-10-CM | POA: Diagnosis not present

## 2022-05-09 DIAGNOSIS — E785 Hyperlipidemia, unspecified: Secondary | ICD-10-CM | POA: Insufficient documentation

## 2022-05-09 DIAGNOSIS — I129 Hypertensive chronic kidney disease with stage 1 through stage 4 chronic kidney disease, or unspecified chronic kidney disease: Secondary | ICD-10-CM | POA: Diagnosis not present

## 2022-05-09 HISTORY — PX: HARDWARE REMOVAL: SHX979

## 2022-05-09 LAB — CBC WITH DIFFERENTIAL/PLATELET
Abs Immature Granulocytes: 0.02 10*3/uL (ref 0.00–0.07)
Basophils Absolute: 0 10*3/uL (ref 0.0–0.1)
Basophils Relative: 0 %
Eosinophils Absolute: 0.2 10*3/uL (ref 0.0–0.5)
Eosinophils Relative: 3 %
HCT: 28.3 % — ABNORMAL LOW (ref 39.0–52.0)
Hemoglobin: 8 g/dL — ABNORMAL LOW (ref 13.0–17.0)
Immature Granulocytes: 0 %
Lymphocytes Relative: 23 %
Lymphs Abs: 1.5 10*3/uL (ref 0.7–4.0)
MCH: 21.4 pg — ABNORMAL LOW (ref 26.0–34.0)
MCHC: 28.3 g/dL — ABNORMAL LOW (ref 30.0–36.0)
MCV: 75.9 fL — ABNORMAL LOW (ref 80.0–100.0)
Monocytes Absolute: 0.6 10*3/uL (ref 0.1–1.0)
Monocytes Relative: 9 %
Neutro Abs: 4.1 10*3/uL (ref 1.7–7.7)
Neutrophils Relative %: 65 %
Platelets: 189 10*3/uL (ref 150–400)
RBC: 3.73 MIL/uL — ABNORMAL LOW (ref 4.22–5.81)
RDW: 28.9 % — ABNORMAL HIGH (ref 11.5–15.5)
WBC: 6.3 10*3/uL (ref 4.0–10.5)
nRBC: 0 % (ref 0.0–0.2)

## 2022-05-09 LAB — BASIC METABOLIC PANEL
Anion gap: 7 (ref 5–15)
BUN: 17 mg/dL (ref 8–23)
CO2: 27 mmol/L (ref 22–32)
Calcium: 9.3 mg/dL (ref 8.9–10.3)
Chloride: 102 mmol/L (ref 98–111)
Creatinine, Ser: 1.22 mg/dL (ref 0.61–1.24)
GFR, Estimated: 59 mL/min — ABNORMAL LOW (ref >60)
Glucose, Bld: 101 mg/dL — ABNORMAL HIGH (ref 70–99)
Potassium: 5.2 mmol/L — ABNORMAL HIGH (ref 3.5–5.1)
Sodium: 136 mmol/L (ref 135–145)

## 2022-05-09 LAB — GLUCOSE, CAPILLARY: Glucose-Capillary: 100 mg/dL — ABNORMAL HIGH (ref 70–99)

## 2022-05-09 SURGERY — REMOVAL, HARDWARE
Anesthesia: General | Laterality: Left

## 2022-05-09 MED ORDER — 0.9 % SODIUM CHLORIDE (POUR BTL) OPTIME
TOPICAL | Status: DC | PRN
  Administered 2022-05-09: 1000 mL

## 2022-05-09 MED ORDER — CHLORHEXIDINE GLUCONATE 0.12 % MT SOLN
OROMUCOSAL | Status: AC
  Filled 2022-05-09: qty 15

## 2022-05-09 MED ORDER — CEFAZOLIN SODIUM-DEXTROSE 2-4 GM/100ML-% IV SOLN
INTRAVENOUS | Status: AC
  Filled 2022-05-09: qty 100

## 2022-05-09 MED ORDER — ACETAMINOPHEN 10 MG/ML IV SOLN
INTRAVENOUS | Status: DC | PRN
  Administered 2022-05-09: 1000 mg via INTRAVENOUS

## 2022-05-09 MED ORDER — ONDANSETRON HCL 4 MG PO TABS: 4.0000 mg | ORAL_TABLET | Freq: Three times a day (TID) | ORAL | 0 refills | Status: DC | PRN

## 2022-05-09 MED ORDER — NEOMYCIN-POLYMYXIN B GU 40-200000 IR SOLN
Status: DC | PRN
  Administered 2022-05-09: 4 mL

## 2022-05-09 MED ORDER — ACETAMINOPHEN 10 MG/ML IV SOLN: 1000.0000 mg | Freq: Once | INTRAVENOUS | Status: DC | PRN

## 2022-05-09 MED ORDER — FENTANYL CITRATE (PF) 100 MCG/2ML IJ SOLN
INTRAMUSCULAR | Status: AC
  Filled 2022-05-09: qty 2

## 2022-05-09 MED ORDER — LIDOCAINE HCL 1 % IJ SOLN
INTRAMUSCULAR | Status: DC | PRN
  Administered 2022-05-09: 15 mL

## 2022-05-09 MED ORDER — ONDANSETRON HCL 4 MG/2ML IJ SOLN
INTRAMUSCULAR | Status: DC | PRN
  Administered 2022-05-09: 4 mg via INTRAVENOUS

## 2022-05-09 MED ORDER — LACTATED RINGERS IV SOLN: INTRAVENOUS | Status: DC

## 2022-05-09 MED ORDER — PROPOFOL 10 MG/ML IV BOLUS
INTRAVENOUS | Status: DC | PRN
  Administered 2022-05-09: 60 mg via INTRAVENOUS
  Administered 2022-05-09: 30 mg via INTRAVENOUS

## 2022-05-09 MED ORDER — CHLORHEXIDINE GLUCONATE 0.12 % MT SOLN
15.0000 mL | Freq: Once | OROMUCOSAL | Status: AC
  Administered 2022-05-09: 15 mL via OROMUCOSAL

## 2022-05-09 MED ORDER — OXYCODONE HCL 5 MG PO TABS
ORAL_TABLET | ORAL | Status: AC
  Filled 2022-05-09: qty 1

## 2022-05-09 MED ORDER — FENTANYL CITRATE (PF) 100 MCG/2ML IJ SOLN: 25.0000 ug | INTRAMUSCULAR | Status: DC | PRN

## 2022-05-09 MED ORDER — BUPIVACAINE HCL (PF) 0.25 % IJ SOLN
INTRAMUSCULAR | Status: AC
  Filled 2022-05-09: qty 30

## 2022-05-09 MED ORDER — LIDOCAINE HCL (CARDIAC) PF 100 MG/5ML IV SOSY
PREFILLED_SYRINGE | INTRAVENOUS | Status: DC | PRN
  Administered 2022-05-09: 100 mg via INTRAVENOUS

## 2022-05-09 MED ORDER — ACETAMINOPHEN 10 MG/ML IV SOLN
INTRAVENOUS | Status: AC
  Filled 2022-05-09: qty 100

## 2022-05-09 MED ORDER — LIDOCAINE HCL (PF) 1 % IJ SOLN
INTRAMUSCULAR | Status: AC
  Filled 2022-05-09: qty 30

## 2022-05-09 MED ORDER — FENTANYL CITRATE (PF) 100 MCG/2ML IJ SOLN
INTRAMUSCULAR | Status: DC | PRN
  Administered 2022-05-09: 25 ug via INTRAVENOUS

## 2022-05-09 MED ORDER — SODIUM CHLORIDE 0.9 % IV SOLN: INTRAVENOUS | Status: DC

## 2022-05-09 MED ORDER — OXYCODONE HCL 5 MG PO TABS
5.0000 mg | ORAL_TABLET | Freq: Once | ORAL | Status: AC | PRN
  Administered 2022-05-09: 5 mg via ORAL

## 2022-05-09 MED ORDER — SULFAMETHOXAZOLE-TRIMETHOPRIM 800-160 MG PO TABS: 1.0000 | ORAL_TABLET | Freq: Two times a day (BID) | ORAL | 0 refills | Status: DC

## 2022-05-09 MED ORDER — OXYCODONE HCL 5 MG/5ML PO SOLN: 5.0000 mg | Freq: Once | ORAL | Status: AC | PRN

## 2022-05-09 MED ORDER — ORAL CARE MOUTH RINSE: 15.0000 mL | Freq: Once | OROMUCOSAL | Status: AC

## 2022-05-09 MED ORDER — BUPIVACAINE HCL (PF) 0.25 % IJ SOLN
INTRAMUSCULAR | Status: DC | PRN
  Administered 2022-05-09: 15 mL

## 2022-05-09 MED ORDER — LACTATED RINGERS IV SOLN
INTRAVENOUS | Status: DC
Start: 2022-05-09 — End: 2022-05-09

## 2022-05-09 MED ORDER — ONDANSETRON HCL 4 MG/2ML IJ SOLN: 4.0000 mg | Freq: Once | INTRAMUSCULAR | Status: DC | PRN

## 2022-05-09 MED ORDER — OXYCODONE HCL 5 MG PO TABS: 5.0000 mg | ORAL_TABLET | ORAL | 0 refills | Status: DC | PRN

## 2022-05-09 SURGICAL SUPPLY — 44 items
BLADE SURG 15 STRL LF DISP TIS (BLADE) ×1 IMPLANT
BLADE SURG 15 STRL SS (BLADE)
BNDG COHESIVE 4X5 TAN STRL LF (GAUZE/BANDAGES/DRESSINGS) IMPLANT
BNDG ESMARK 6X12 TAN STRL LF (GAUZE/BANDAGES/DRESSINGS) ×2 IMPLANT
CUFF TOURN SGL QUICK 18X4 (TOURNIQUET CUFF) IMPLANT
CUFF TOURN SGL QUICK 24 (TOURNIQUET CUFF)
CUFF TRNQT CYL 24X4X16.5-23 (TOURNIQUET CUFF) IMPLANT
DRAPE FLUOR MINI C-ARM 54X84 (DRAPES) ×2 IMPLANT
DRAPE INCISE IOBAN 66X45 STRL (DRAPES) ×1 IMPLANT
DRAPE U-SHAPE 47X51 STRL (DRAPES) ×2 IMPLANT
DURAPREP 26ML APPLICATOR (WOUND CARE) ×2 IMPLANT
ELECT REM PT RETURN 9FT ADLT (ELECTROSURGICAL) ×2
ELECTRODE REM PT RTRN 9FT ADLT (ELECTROSURGICAL) ×1 IMPLANT
GAUZE SPONGE 4X4 12PLY STRL (GAUZE/BANDAGES/DRESSINGS) ×2 IMPLANT
GAUZE XEROFORM 1X8 LF (GAUZE/BANDAGES/DRESSINGS) ×2 IMPLANT
GLOVE BIOGEL PI IND STRL 9 (GLOVE) ×1 IMPLANT
GLOVE BIOGEL PI INDICATOR 9 (GLOVE) ×1
GLOVE BIOGEL PI ORTHO SZ9 (GLOVE) ×4 IMPLANT
GOWN STRL REUS TWL 2XL XL LVL4 (GOWN DISPOSABLE) ×2 IMPLANT
GOWN STRL REUS W/ TWL LRG LVL3 (GOWN DISPOSABLE) ×1 IMPLANT
GOWN STRL REUS W/TWL LRG LVL3 (GOWN DISPOSABLE) ×1
KIT TURNOVER KIT A (KITS) ×2 IMPLANT
LABEL OR SOLS (LABEL) ×1 IMPLANT
MANIFOLD NEPTUNE II (INSTRUMENTS) ×2 IMPLANT
NS IRRIG 1000ML POUR BTL (IV SOLUTION) ×2 IMPLANT
PACK EXTREMITY ARMC (MISCELLANEOUS) ×2 IMPLANT
PAD ABD DERMACEA PRESS 5X9 (GAUZE/BANDAGES/DRESSINGS) ×2 IMPLANT
PAD CAST CTTN 4X4 STRL (SOFTGOODS) ×1 IMPLANT
PADDING CAST COTTON 4X4 STRL (SOFTGOODS) ×1
SPLINT CAST 1 STEP 4X30 (MISCELLANEOUS) ×1 IMPLANT
SPONGE T-LAP 18X18 ~~LOC~~+RFID (SPONGE) ×1 IMPLANT
STAPLER SKIN PROX 35W (STAPLE) ×1 IMPLANT
STOCKINETTE IMPERVIOUS 9X36 MD (GAUZE/BANDAGES/DRESSINGS) IMPLANT
STOCKINETTE STRL 6IN 960660 (GAUZE/BANDAGES/DRESSINGS) ×2 IMPLANT
STRIP CLOSURE SKIN 1/2X4 (GAUZE/BANDAGES/DRESSINGS) ×2 IMPLANT
SUT ETHILON 3-0 FS-10 30 BLK (SUTURE) ×4
SUT PDS 2-0 27IN (SUTURE) ×1 IMPLANT
SUT VIC AB 2-0 SH 27 (SUTURE)
SUT VIC AB 2-0 SH 27XBRD (SUTURE) ×1 IMPLANT
SUTURE EHLN 3-0 FS-10 30 BLK (SUTURE) IMPLANT
SYR 30ML LL (SYRINGE) ×1 IMPLANT
TAPE PAPER 2X10 WHT MICROPORE (GAUZE/BANDAGES/DRESSINGS) ×1 IMPLANT
TRAP FLUID SMOKE EVACUATOR (MISCELLANEOUS) ×2 IMPLANT
WATER STERILE IRR 500ML POUR (IV SOLUTION) ×1 IMPLANT

## 2022-05-09 NOTE — Transfer of Care (Signed)
Immediate Anesthesia Transfer of Care Note  Patient: [name]  Procedure(s) Performed: HARDWARE REMOVAL LEFT ANKLE (Left)  Patient Location: PACU  Anesthesia Type:General  Level of Consciousness: awake and drowsy  Airway & Oxygen Therapy: Patient Spontanous Breathing and Patient connected to face mask oxygen  Post-op Assessment: Report given to RN and Post -op Vital signs reviewed and stable  Post vital signs: Reviewed and stable  Last Vitals:  Vitals Value Taken Time  BP 179/92 05/09/22 1132  Temp    Pulse 34 05/09/22 1132  Resp 31 05/09/22 1132  SpO2 100 % 05/09/22 1132    Last Pain:  Vitals:   05/09/22 0827  TempSrc: Temporal  PainSc: 0-No pain         Complications: No notable events documented.

## 2022-05-09 NOTE — Anesthesia Preprocedure Evaluation (Addendum)
Anesthesia Evaluation  Patient identified by MRN, date of birth, ID band Patient awake    Reviewed: Allergy & Precautions, NPO status , Patient's Chart, lab work & pertinent test results  History of Anesthesia Complications (+) history of anesthetic complications (cardiac arrest with dye from lexiscan (04/15/10 records indicate bradycardia, no ischemia, EF 56%), unstable BP with anesthesia)  Airway Mallampati: II   Neck ROM: Full    Dental no notable dental hx.    Pulmonary neg pulmonary ROS,    Pulmonary exam normal breath sounds clear to auscultation       Cardiovascular hypertension, + dysrhythmias (a fib on Eliquis) + Valvular Problems/Murmurs MR  Rhythm:Regular Rate:Bradycardia  DVT 2021  ECG 09/05/21:  Atrial fibrillation Incomplete left bundle branch block Anterior Q waves, possibly due to ILBBB   Neuro/Psych Seizures -,  [facility] 2010  Neuromuscular disease (neuropathy) CVA (2002, residual left foot numbness)    GI/Hepatic GERD  ,  Endo/Other  Obesity   Renal/GU      Musculoskeletal  (+) Arthritis ,   Abdominal   Peds  Hematology  (+) Blood dyscrasia, anemia ,   Anesthesia Other Findings Cardiology note 05/02/22:  81 y.o. male with  1. Permanent atrial fibrillation (CMS-HCC)  2. Primary hypertension  3. Deep vein thrombosis (DVT) of proximal lower extremity, unspecified chronicity, unspecified laterality (CMS-HCC)  4. Cerebrovascular accident (CVA), unspecified mechanism (CMS-HCC)  5. Hyperlipidemia, unspecified hyperlipidemia type  6. History of cerebral hemorrhage  7. Gastroesophageal reflux disease, unspecified whether esophagitis present  8. Obesity due to excess calories with serious comorbidity, unspecified classification   Plan  1 permanent atrial fibrillation patient is maintained on Eliquis for anticoagulation is not on any medications for rate control and is not necessary 2 recommend reducing  Eliquis to 2.5 twice a day to reduce his risk of bleeding 3 bradycardia with intermittent PVCs bigeminy trigeminy asymptomatic no clear indication for permanent pacemaker at this point 4 72-hour Holter monitor for evaluation of bradycardia 5 echocardiogram for evaluation of persistent atrial fibrillation 6 Lexiscan Myoview for evaluation of ischemia 7 profound anemia hemoglobin under 7 would recommend transfusion evaluation by GI and/or hematology 8 recommend conservative management until anemia evaluated and treated 9 continue Protonix therapy for reflux type symptoms 10 hyperlipidemia agree with simvastatin therapy for lipid management 11 follow-up in 1 month  Return in about 2 months (around 07/02/2022).   Reproductive/Obstetrics                            Anesthesia Physical Anesthesia Plan  ASA: 3  Anesthesia Plan: General   Post-op Pain Management:    Induction: Intravenous  PONV Risk Score and Plan: 2 and Ondansetron, Dexamethasone and Treatment may vary due to age or medical condition  Airway Management Planned: LMA  Additional Equipment:   Intra-op Plan:   Post-operative Plan: Extubation in OR  Informed Consent: I have reviewed the patients History and Physical, chart, labs and discussed the procedure including the risks, benefits and alternatives for the proposed anesthesia with the patient or authorized representative who has indicated his/her understanding and acceptance.     Dental advisory given  Plan Discussed with: CRNA  Anesthesia Plan Comments: (Patient consented for risks of anesthesia including but not limited to:  - adverse reactions to medications - damage to eyes, teeth, lips or other oral mucosa - nerve damage due to positioning  - sore throat or hoarseness - damage to heart, brain, nerves, lungs,  other parts of body or loss of life  Informed patient about role of CRNA in peri- and intra-operative care.  Patient voiced  understanding.)        Anesthesia Quick Evaluation

## 2022-05-09 NOTE — Progress Notes (Signed)
Patient awake/alert x4. LLE elevated, toes exposed warm to touch, able to wiggle. Per Dr. Martha Clan patient has appointment with pcp/labs in am, GI appointment Thursday. On Eliquis for atrial fib. Patient c/o's "spasms" left foot area, not new. Reviewed plan of care with patient, verbalizes understanding.

## 2022-05-09 NOTE — Anesthesia Postprocedure Evaluation (Signed)
Anesthesia Post Note  Patient: [name]  Procedure(s) Performed: HARDWARE REMOVAL LEFT ANKLE (Left)  Patient location during evaluation: PACU Anesthesia Type: General Level of consciousness: awake and alert, oriented and patient cooperative Pain management: pain level controlled Vital Signs Assessment: post-procedure vital signs reviewed and stable Respiratory status: spontaneous breathing, nonlabored ventilation and respiratory function stable Cardiovascular status: blood pressure returned to baseline and stable Postop Assessment: adequate PO intake Anesthetic complications: no   No notable events documented.   Last Vitals:  Vitals:   05/09/22 1132 05/09/22 1145  BP: (!) 179/92 (!) 148/62  Pulse: (!) 34 78  Resp: (!) 22 15  Temp:  (!) 36.3 C  SpO2: 100% 99%    Last Pain:  Vitals:   05/09/22 1145  TempSrc:   PainSc: 4                  Reed Breech

## 2022-05-09 NOTE — Progress Notes (Signed)
This is a no charge note    Patient is 81 year old male with past medical history of hypertension, hyperlipidemia, stroke with poor balance, GERD, subdural hematoma, left leg DVT and atrial fibrillation on Eliquis (last dose of Eliquis was on Friday), PAH, mitral valve regurgitation, seizure, iron deficiency anemia, CKD-2, GI bleeding, possible dCHF (patient is on Lasix, 2D echo on 09/06/2021 showed EF of 50-55% with indeterminant diastolic function), history of left ankle fracture with hardware in place, who is here for scheduled surgery for removing hardware in the left ankle due to possible infection by Dr. Martha Clan of Ortho.  Patient reports that he had noticed black stool in Sunday and Monday, but no further black stool or rectal bleeding today.  Denies chest pain, cough, shortness breath.  No nausea, vomiting, diarrhea or abdominal pain.  No symptoms of UTI.  Patient has mild pain in left ankle.  His left ankle is mildly erythematous.  He states that he stopped taking Eliquis on Friday morning for preparation for the surgery.   Due to black stool, Dr. Martha Clan requested for possible admission.  After discussed with patient and his family (his son and wife at the bedside), patient states that he had made appointments with GI, PCP and cardiology already.  Patient wants to go home rather than being admitted today.  Per Dr. Martha Clan, patient will be observed closely after surgery.  If patient is doing well, she will be discharged home.  If patient does not do well after surgery, we will be asked to admit patient. I told pt and his family and Dr. Martha Clan that we are happy to admit patient if needed.  Patient was found to have WBC 6.3, hemoglobin 8.0 (hemoglobin 6.8 on 04/19/2022), slightly worsening renal function with creatinine 1.22, BUN 17, GFR 59 ( recent creatinine 0.68), potassium of 5.2, temperature normal, blood pressure 190/105, heart rate 36, RR 16, oxygen saturation 97% on room air.    Addendum:  after the procedure, I was told by Dr. Martha Clan that pt did not need to be admitted, so we were not involved of this pt care finally.     Please call manager of [facility] at [phone] when pt arrives to floor   Lorretta Harp, MD  [facility]   If 7PM-7AM, please contact night-coverage [email or web address] 05/09/2022, 10:57 AM

## 2022-05-09 NOTE — Op Note (Signed)
05/09/2022  11:45 AM  PATIENT:  [name]    PRE-OPERATIVE DIAGNOSIS: Left lateral ankle wound dehiscence  POST-OPERATIVE DIAGNOSIS:  Same  PROCEDURE: Removal of hardware left ankle  SURGEON:  Thornton Park, MD  ANESTHESIA:   General  TOURNIQUET TIME:  60 minutes.    EBL:  50 cc.  SPECIMEN: Culture swab and soft tissue sample from the lateral wound sent to microbiology for Gram stain and culture.  PREOPERATIVE INDICATIONS:  [name] is a  81 y.o. male with a diagnosis of left wound dehiscence of a lateral incision from a previous ankle ORIF on 08/19/2021.      I discussed the risks and benefits of surgery. The risks include but are not limited to infection, bleeding, nerve or blood vessel injury, joint stiffness or loss of motion, persistent pain, weakness or instability, fracture and the need for further surgery. Medical risks include but are not limited to DVT and pulmonary embolism, myocardial infarction, stroke, pneumonia, respiratory failure and death. Patient understood these risks and wished to proceed.   OPERATIVE FINDINGS: No active drainage or abscess was encountered during surgery.  Patient's fibula fracture was well-healed.  OPERATIVE PROCEDURE: Patient was met in the preoperative area.  A preop history and physical was performed.  The left leg was marked according to hospital's correct protocol.  Patient was brought to the operating room was placed supine on the operative table.  He underwent general anesthesia.  Patient had a bump placed under his left hip and his left lower extremity was prepped and draped in a sterile fashion.  A timeout was performed to verify the patient's name, date of birth, correct surgical site, and correct side of surgery.  Once all in attendance were in agreement the case began.  Patient had a tourniquet inflated without an Esmarch given the concern for possible infection.  Tourniquet was inflated for a total of 60 minutes.   Patient's previous incision was reopened with a #15 blade.  Full-thickness skin flaps were developed.  There is no purulent fluid encountered.  The surgical wound was swabbed with a culture swab which was sent to the lab for Gram stain and culture.  Tissue samples were also sent for analysis.  Patient then received 2 g of Ancef IV.  The underlying plate and screws from his previous ankle fracture was identified.  A small fragment screwdriver was used to remove all the screws and the plate was easily removed from the lateral fibula.  Patient's lateral malleolus fracture was well-healed.  A curette was used to remove soft tissue under the plate.  The screw holes were also debrided with a curette.  No purulent fluid was encountered.  There is no areas of soft bone concerning for osteomyelitis.  Wound was copiously irrigated.  The patient's wound was then closed using 3-0 nylon in a horizontal mattress fashion.  A dry sterile dressing was applied.  The tourniquet was deflated at 60 minutes.  I scrubbed and present for the entire case.  All sharp and instrument counts were correct at the conclusion the case.  Patient was brought to the PACU in stable condition.  I spoke with the family postoperatively to let them know the patient was stable in recovery and the surgery was performed successfully.

## 2022-05-09 NOTE — Anesthesia Procedure Notes (Signed)
Procedure Name: LMA Insertion Date/Time: 05/09/2022 10:00 AM  Performed by: Ginger Carne, CRNAPre-anesthesia Checklist: Patient identified, Emergency Drugs available, Suction available, Patient being monitored and Timeout performed Patient Re-evaluated:Patient Re-evaluated prior to induction Oxygen Delivery Method: Circle system utilized Preoxygenation: Pre-oxygenation with 100% oxygen Induction Type: IV induction Ventilation: Mask ventilation without difficulty LMA: LMA inserted LMA Size: 5.0 Tube type: Oral Number of attempts: 1 Placement Confirmation: positive ETCO2 and breath sounds checked- equal and bilateral Tube secured with: Tape Dental Injury: Teeth and Oropharynx as per pre-operative assessment

## 2022-05-09 NOTE — H&P (Signed)
PREOPERATIVE H&P  Chief Complaint: Left ankle wound dehiscence with underlying hardware  HPI: [name] is a 81 y.o. male who presents for preoperative history and physical with a diagnosis of left ankle wound dehiscence.  Patient is status post ORIF of a left bimalleolar ankle fracture on 08/19/2021.  His postoperative course has been complicated by areas of wound dehiscence.  Patient demonstrated no active drainage in the office.  He has small areas of dehiscence of his incision without visualized hardware.  There is mild to moderate generalized edema in his left lower extremity.  He has baseline neuropathy and mild erythema over the lower leg.   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 Jerl Mina, [facility] in [place]   Intracranial bleed [facility])    december 2010   Left leg DVT (HCC) 09/2019   Migraines    "2 in my life" (09/17/2012)   Mitral regurgitation    a. 01/2019 Echo: EF 55-60%, mild conc LVH. RVSP 59.38mmHg. Mildly dil LA. Mild to mod MR. Mild AI.   PAH (pulmonary artery hypertension) (HCC)    Permanent atrial fibrillation (HCC)    a. CHA2DS2VASc = 5-->No OAC 2/2 h/o SAH; b. 03/2019 Zio: permanent Afib - 78 bpm (46-135). Occas PVCs (1.4% burden).   Retina disorder    hx of torn retina   Stroke (HCC) 2002   LLE "just a little bit weaker" (09/17/2012)   Subarachnoid hemorrhage (HCC) 08/2009   Past Surgical History:  Procedure Laterality Date   BLEPHAROPLASTY  1990's   "right  eye" (09/16/2012)   CARDIOVASCULAR STRESS TEST     nl perfusion w/o ischemia, EF 56%, bradycardia without syncope 04/15/10 (Dr. Harold Hedge)   CATARACT EXTRACTION W/ INTRAOCULAR LENS IMPLANT  1980's   "right" (09/17/2012)   COLONOSCOPY     06/26/09   EYE MUSCLE SURGERY  1980's   "right eye" (09/17/2012)   INGUINAL HERNIA REPAIR  04/10/2011   "right" (09/17/2012)   JOINT REPLACEMENT     ORIF ANKLE FRACTURE Left 08/19/2021   Procedure: OPEN REDUCTION INTERNAL FIXATION (ORIF) ANKLE FRACTURE;  Surgeon: Juanell Fairly, MD;  Location: [facility] ORS;  Service: Orthopedics;  Laterality: Left;   PERIPHERAL VASCULAR THROMBECTOMY Left 10/27/2019   Procedure: PERIPHERAL VASCULAR THROMBECTOMY;  Surgeon: Annice Needy, MD;  Location: [facility] INVASIVE CV LAB;  Service: Cardiovascular;  Laterality: Left;   POSTERIOR LUMBAR FUSION  09/16/2012   "L4-5" (09/17/2012)   RETINAL DETACHMENT SURGERY  1980's   "right" (09/17/2012)   TOTAL KNEE ARTHROPLASTY  04/28/2010   "left" (09/17/2012)   Social History   Socioeconomic History   Marital status: Married    Spouse name: Not on file   Number of children: Not on file   Years of education: Not on file   Highest education level: Not on file  Occupational History   Not on file  Tobacco Use   Smoking status: Never   Smokeless tobacco:  Never  Vaping Use   Vaping Use: Never used  Substance and Sexual Activity   Alcohol use: No   Drug use: No   Sexual activity: Not Currently  Other Topics Concern   Not on file  Social History Narrative   Lives at home with wife in private residence   Social Determinants of Health   Financial Resource Strain: Not on file  Food Insecurity: Not on file  Transportation Needs: Not on file  Physical Activity: Not on file  Stress: Not on file  Social Connections: Not on file   Family History  Problem Relation Age of Onset   Heart disease Mother    Allergies  Allergen Reactions   Codeine Nausea Only and Other (See Comments)    Fentanyl Other (See Comments)    Given before knee surgery, blood pressure became extremely low   Regadenoson Other (See Comments)    Created cardiac arrest   Verapamil Other (See Comments)    Per patient: Medication has caused low heart rate and caused patient to have afib that he currently has   Acetaminophen Other (See Comments)    Pt states he feels hot when he takes acetaminophen   Prior to Admission medications   Medication Sig Start Date End Date Taking? Authorizing Provider  aliskiren (TEKTURNA) 300 MG tablet Take 300 mg by mouth daily.    Yes [provider]  carbamazepine (TEGRETOL) 200 MG tablet Take 200 mg by mouth 2 (two) times a day.    Yes [provider]  doxazosin (CARDURA) 1 MG tablet Take 1 mg by mouth 2 (two) times daily. 04/20/22  Yes [provider]  furosemide (LASIX) 20 MG tablet Take 20 mg by mouth 2 (two) times daily. 03/08/22  Yes [provider]  furosemide (LASIX) 40 MG tablet Take 40 mg by mouth daily. 08/24/21  Yes [provider]  irbesartan (AVAPRO) 300 MG tablet Take 300 mg by mouth daily. 11/19/17  Yes [provider]  Omega-3 Fatty Acids (FISH OIL) 1200 MG CAPS Take 1,200 mg by mouth daily.   Yes [provider]  pantoprazole (PROTONIX) 40 MG tablet Take 40 mg by mouth at bedtime.    Yes [provider]  simvastatin (ZOCOR) 20 MG tablet Take 20 mg by mouth 2 (two) times daily.    Yes [provider]  vitamin B-12 (CYANOCOBALAMIN) 1000 MCG tablet Take 1,000 mcg by mouth daily.   Yes [provider]  acetaminophen (TYLENOL) 325 MG tablet Take 1-2 tablets (325-650 mg total) by mouth every 6 (six) hours as needed for mild pain (pain score 1-3 or temp > 100.5). 08/23/21   Lurene Shadow, MD  docusate sodium (COLACE) 100 MG capsule Take 1 capsule (100 mg total) by mouth 2 (two) times daily. 08/23/21   Lurene Shadow, MD  ELIQUIS 5 MG TABS tablet Take 5 mg by mouth 2 (two)  times daily. 10/06/19   [provider]  HYDROcodone-acetaminophen (NORCO/VICODIN) 5-325 MG tablet Take 1 tablet by mouth every 6 (six) hours as needed for moderate pain. 08/23/21   Lurene Shadow, MD  senna (SENOKOT) 8.6 MG TABS tablet Take 1 tablet (8.6 mg total) by mouth 2 (two) times daily. 08/23/21   Lurene Shadow, MD     Positive ROS: All other systems have been reviewed and were otherwise negative with the exception of those mentioned in the HPI and as above.  Physical Exam: General: Alert, no acute distress Cardiovascular: Regular rate and rhythm, no murmurs  rubs or gallops.  No pedal edema Respiratory: Clear to auscultation bilaterally, no wheezes rales or rhonchi. No cyanosis, no use of accessory musculature GI: No organomegaly, abdomen is soft and non-tender nondistended with positive bowel sounds. Skin: Skin intact, no lesions within the operative field. Neurologic: Sensation intact distally Psychiatric: Patient is competent for consent with normal mood and affect Lymphatic: No cervical lymphadenopathy  MUSCULOSKELETAL: Left ankle: Lateral incision with small areas of wound dehiscence without drainage.  There is faint erythema around the lateral incision.  Patient has peripheral neuropathy with decreased sensation in both feet.  Can flex and extend his toes and dorsiflex and plantarflex his ankle.  Assessment: Left ankle wound dehiscence with possible infection in the setting of underlying hardware from previous ankle ORIF.  Plan: Plan for Procedure(s): LEFT ANKLE HARDWARE REMOVAL  The patient had mentioned today that he was having black stools recently.  He states he was taking Pepto-Bismol for some diarrhea over the weekend.  Patient has a hemoglobin of 8 today.  I discussed this case with Dr. Clyde Lundborg the hospitalist who evaluated the patient at the bedside as well as Dr. Ronni Rumble and anesthesia.  I also discussed our concerns with the patient and his family.  I feel that  it is important to remove the lateral plate and washout the lateral wound as there is a possible underlying infection.  We will use a tourniquet and I do not expect any significant blood loss from this case.  Patient has follow-up appointments with his PCP tomorrow and with GI and cardiology on Thursday.  The patient and his family agreed with the plan for discharge home and follow-up with his outpatient specialist later this week.  If he is having any issues in the PACU today Dr. Clyde Lundborg is happy to admit him.  Cultures of the patient's ankle wound will be taken during the case.  Patient will be discharged home on antibiotics until culture results are available.  I discussed the risks and benefits of surgery with the patient and his family. The risks include but are not limited to infection, bleeding, nerve or blood vessel injury, joint stiffness or loss of motion, persistent pain, weakness or instability, fracture, nonunion and hardware failure cleaning plate or screw breakage and the need for further surgery. Medical risks include but are not limited to DVT and pulmonary embolism, myocardial infarction, stroke, pneumonia, respiratory failure and death. Patient understood these risks and wished to proceed.     Juanell Fairly, MD   05/09/2022 9:38 AM

## 2022-05-09 NOTE — Discharge Instructions (Signed)

## 2022-05-10 ENCOUNTER — Other Ambulatory Visit: Payer: Self-pay

## 2022-05-10 ENCOUNTER — Encounter: Payer: Self-pay | Admitting: Orthopedic Surgery

## 2022-05-10 ENCOUNTER — Other Ambulatory Visit: Payer: Self-pay | Admitting: Internal Medicine

## 2022-05-10 DIAGNOSIS — I208 Other forms of angina pectoris: Secondary | ICD-10-CM

## 2022-05-11 DIAGNOSIS — I4821 Permanent atrial fibrillation: Secondary | ICD-10-CM | POA: Diagnosis not present

## 2022-05-14 LAB — AEROBIC/ANAEROBIC CULTURE W GRAM STAIN (SURGICAL/DEEP WOUND): Gram Stain: NONE SEEN

## 2022-05-17 ENCOUNTER — Telehealth (HOSPITAL_COMMUNITY): Payer: Self-pay | Admitting: Emergency Medicine

## 2022-05-17 NOTE — Telephone Encounter (Signed)
Unable to leave vm but sent mychart message with instructions  Rockwell Alexandria RN Navigator Cardiac Imaging [facility] [phone] Office  [phone] Cell

## 2022-05-18 ENCOUNTER — Ambulatory Visit
Admission: RE | Admit: 2022-05-18 | Discharge: 2022-05-18 | Disposition: A | Discharge status: Home / Self Care | Payer: Medicare Other | Source: Ambulatory Visit | Attending: Internal Medicine | Admitting: Internal Medicine

## 2022-05-18 DIAGNOSIS — I208 Other forms of angina pectoris: Secondary | ICD-10-CM

## 2022-05-18 NOTE — Progress Notes (Signed)
Patient arrived for Cardiac CT. Patient in A-Fib. HR 78-88. Dr Azucena Cecil notified and cancelled Cardiac CT and referred back to Dr Juliann Pares for alternative testing. Patient and son notified verbalized understanding.

## 2022-05-22 DIAGNOSIS — T8130XD Disruption of wound, unspecified, subsequent encounter: Secondary | ICD-10-CM | POA: Diagnosis not present

## 2022-05-22 DIAGNOSIS — Z09 Encounter for follow-up examination after completed treatment for conditions other than malignant neoplasm: Secondary | ICD-10-CM | POA: Diagnosis not present

## 2022-06-14 ENCOUNTER — Encounter (HOSPITAL_BASED_OUTPATIENT_CLINIC_OR_DEPARTMENT_OTHER): Payer: Medicare Other | Attending: General Surgery | Admitting: General Surgery

## 2022-06-14 DIAGNOSIS — I10 Essential (primary) hypertension: Secondary | ICD-10-CM | POA: Insufficient documentation

## 2022-06-14 DIAGNOSIS — X58XXXA Exposure to other specified factors, initial encounter: Secondary | ICD-10-CM | POA: Insufficient documentation

## 2022-06-14 DIAGNOSIS — L97326 Non-pressure chronic ulcer of left ankle with bone involvement without evidence of necrosis: Secondary | ICD-10-CM | POA: Insufficient documentation

## 2022-06-14 DIAGNOSIS — T8131XA Disruption of external operation (surgical) wound, not elsewhere classified, initial encounter: Secondary | ICD-10-CM | POA: Insufficient documentation

## 2022-06-14 NOTE — Progress Notes (Signed)
ZALE, MARCOTTE ([id number]) Visit Report for 06/14/2022 Allergy List Details Patient Name: Date of Service: EMERIC, NOVINGER 06/14/2022 9:00 A M Medical Record Number: [id number] Patient Account Number: [id number] Date of Birth/Sex: Treating RN: 29-Feb-1941 (81 y.o. Ernestene Mention Primary Care Toshio Slusher: [name] Other Clinician: Referring Dulcemaria Bula: Treating Mahari Strahm/Extender: Lenox Ponds in Treatment: 0 Allergies Active Allergies codeine Reaction: nausea Severity: Moderate fentanyl Reaction: "too strong" regadenoson Reaction: unknown verapamil Reaction: unknown acetaminophen Sulfa (Sulfonamide Antibiotics) Reaction: hives Allergy Notes Electronic Signature(s) Signed: 06/14/2022 5:41:34 PM By: Baruch Gouty RN, BSN Entered By: Baruch Gouty on 06/14/2022 10:29:44 -------------------------------------------------------------------------------- Arrival Information Details Patient Name: Date of Service: Harley Alto E. 06/14/2022 9:00 A M Medical Record Number: [id number] Patient Account Number: [id number] Date of Birth/Sex: Treating RN: [birth year] (81 y.o. Ernestene Mention Primary Care Felma Pfefferle: [name] Other Clinician: Referring Parke Jandreau: Treating Chidera Thivierge/Extender: Lenox Ponds in Treatment: 0 Visit Information Patient Arrived: Wheel Chair Arrival Time: 09:19 Accompanied By: spouse Transfer Assistance: None Patient Identification Verified: Yes Secondary Verification Process Completed: Yes Patient Requires Transmission-Based Precautions: No Patient Has Alerts: Yes Patient Alerts: Patient on Blood Thinner Electronic Signature(s) Signed: 06/14/2022 5:41:34 PM By: Baruch Gouty RN, BSN Entered By: Baruch Gouty on 06/14/2022 09:35:40 -------------------------------------------------------------------------------- Lower Extremity Assessment Details Patient Name: Date of  Service: Harley Alto E. 06/14/2022 9:00 A M Medical Record Number: [id number] Patient Account Number: [id number] Date of Birth/Sex: Treating RN: 04-16-1941 (81 y.o. Ernestene Mention Primary Care Mitsue Peery: [name] Other Clinician: Referring Heiress Williamson: Treating Ajwa Kimberley/Extender: Lenox Ponds in Treatment: 0 Edema Assessment Assessed: Shirlyn Goltz: No] [Right: No] Edema: [Left: Ye] [Right: s] Calf Left: Right: Point of Measurement: From Medial Instep 36.4 cm Ankle Left: Right: Point of Measurement: From Medial Instep 23 cm Knee To Floor Left: Right: From Medial Instep 44 cm Vascular Assessment Pulses: Dorsalis Pedis Palpable: [Left:No] Blood Pressure: Brachial: [Left:180] Dorsalis Pedis: 130 Ankle: [Left:Posterior Tibial: 154 0.86] Electronic Signature(s) Signed: 06/14/2022 5:41:34 PM By: Baruch Gouty RN, BSN Entered By: Baruch Gouty on 06/14/2022 10:16:28 -------------------------------------------------------------------------------- Multi Wound Chart Details Patient Name: Date of Service: Harley Alto E. 06/14/2022 9:00 A M Medical Record Number: [id number] Patient Account Number: [id number] Date of Birth/Sex: Treating RN: 09-01-1941 (81 y.o. M) Primary Care Pola Furno: [name] Other Clinician: Referring Zaidee Rion: Treating Ferris Fielden/Extender: Lenox Ponds in Treatment: 0 Vital Signs Height(in): 72 Pulse(bpm): 39 Weight(lbs): 205 Blood Pressure(mmHg): 196/85 Body Mass Index(BMI): 27.8 Temperature(F): 97.9 Respiratory Rate(breaths/min): 18 Photos: [N/A:N/A] Left, Lateral Lower Leg N/A N/A Wound Location: Surgical Injury N/A N/A Wounding Event: Open Surgical Wound N/A N/A Primary Etiology: Cataracts, Arrhythmia, Deep Vein N/A N/A Comorbid History: Thrombosis, Hypertension, Osteoarthritis, Neuropathy, Seizure Disorder 05/09/2022 N/A N/A Date Acquired: 0 N/A N/A Weeks of  Treatment: Open N/A N/A Wound Status: No N/A N/A Wound Recurrence: Yes N/A N/A Clustered Wound: 3 N/A N/A Clustered Quantity: 10.4x1.6x0.8 N/A N/A Measurements L x W x D (cm) 13.069 N/A N/A A (cm) : rea 10.455 N/A N/A Volume (cm) : 0.00% N/A N/A % Reduction in A rea: 0.00% N/A N/A % Reduction in Volume: Full Thickness With Exposed Support N/A N/A Classification: Structures Medium N/A N/A Exudate A mount: Serosanguineous N/A N/A Exudate Type: red, brown N/A N/A Exudate Color: Distinct, outline attached N/A N/A Wound Margin: Medium (34-66%) N/A N/A Granulation A mount: Red N/A N/A Granulation Quality: Medium (34-66%) N/A N/A Necrotic A mount: Fat Layer (Subcutaneous  Tissue): Yes N/A N/A Exposed Structures: Bone: Yes Fascia: No Tendon: No Muscle: No Joint: No Small (1-33%) N/A N/A Epithelialization: Debridement - Excisional N/A N/A Debridement: Pre-procedure Verification/Time Out 10:25 N/A N/A Taken: Lidocaine 4% Topical Solution N/A N/A Pain Control: Subcutaneous, Slough N/A N/A Tissue Debrided: Skin/Subcutaneous Tissue N/A N/A Level: 12.8 N/A N/A Debridement A (sq cm): rea Curette N/A N/A Instrument: Minimum N/A N/A Bleeding: Pressure N/A N/A Hemostasis A chieved: 0 N/A N/A Procedural Pain: 0 N/A N/A Post Procedural Pain: Procedure was tolerated well N/A N/A Debridement Treatment Response: 10.4x1.6x0.8 N/A N/A Post Debridement Measurements L x W x D (cm) 10.455 N/A N/A Post Debridement Volume: (cm) Debridement N/A N/A Procedures Performed: Treatment Notes Electronic Signature(s) Signed: 06/14/2022 10:51:07 AM By: Duanne Guess MD FACS Entered By: Duanne Guess on 06/14/2022 10:51:07 -------------------------------------------------------------------------------- Multi-Disciplinary Care Plan Details Patient Name: Date of Service: [name] 06/14/2022 9:00 A M Medical Record Number: [id number] Patient Account  Number: [id number] Date of Birth/Sex: Treating RN: 1941-02-18 (81 y.o. [name] Primary Care Marialuisa Basara: Jerl Mina Other Clinician: Referring Madison Albea: Treating Mikki Ziff/Extender: Cinda Quest in Treatment: 0 Multidisciplinary Care Plan reviewed with physician Active Inactive Wound/Skin Impairment Nursing Diagnoses: Impaired tissue integrity Knowledge deficit related to ulceration/compromised skin integrity Goals: Patient/caregiver will verbalize understanding of skin care regimen Date Initiated: 06/14/2022 Target Resolution Date: 07/12/2022 Goal Status: Active Ulcer/skin breakdown will have a volume reduction of 30% by week 4 Date Initiated: 06/14/2022 Target Resolution Date: 07/12/2022 Goal Status: Active Interventions: Assess patient/caregiver ability to obtain necessary supplies Assess patient/caregiver ability to perform ulcer/skin care regimen upon admission and as needed Assess ulceration(s) every visit Provide education on ulcer and skin care Treatment Activities: Skin care regimen initiated : 06/14/2022 Topical wound management initiated : 06/14/2022 Notes: Electronic Signature(s) Signed: 06/14/2022 5:41:34 PM By: Zenaida Deed RN, BSN Entered By: Zenaida Deed on 06/14/2022 10:28:39 -------------------------------------------------------------------------------- Pain Assessment Details Patient Name: Date of Service: [name] 06/14/2022 9:00 A M Medical Record Number: [id number] Patient Account Number: [id number] Date of Birth/Sex: Treating RN: 1941-02-10 (80 y.o. [name] Primary Care Pamila Mendibles: Jerl Mina Other Clinician: Referring Ferd Horrigan: Treating Furious Chiarelli/Extender: Cinda Quest in Treatment: 0 Active Problems Location of Pain Severity and Description of Pain Patient Has Paino No Site Locations Rate the pain. Rate the pain. Current Pain Level: 0 Pain Management  and Medication Current Pain Management: Electronic Signature(s) Signed: 06/14/2022 5:41:34 PM By: Zenaida Deed RN, BSN Entered By: Zenaida Deed on 06/14/2022 10:20:35 -------------------------------------------------------------------------------- Patient/Caregiver Education Details Patient Name: Date of Service: Wilson Singer 9/20/2023andnbsp9:00 A M Medical Record Number: [id number] Patient Account Number: [id number] Date of Birth/Gender: Treating RN: 26-Mar-1941 (81 y.o. [name] Primary Care Physician: Jerl Mina Other Clinician: Referring Physician: Treating Physician/Extender: Cinda Quest in Treatment: 0 Education Assessment Education Provided To: Patient Education Topics Provided [facility]: o Handouts: Welcome [facility] o Methods: Explain/Verbal, Printed Responses: Reinforcements needed, State content correctly Wound/Skin Impairment: Handouts: Caring for Your Ulcer, Skin Care Do's and Dont's Methods: Explain/Verbal, Printed Responses: Reinforcements needed, State content correctly Electronic Signature(s) Signed: 06/14/2022 5:41:34 PM By: Zenaida Deed RN, BSN Entered By: Zenaida Deed on 06/14/2022 10:29:19 -------------------------------------------------------------------------------- Wound Assessment Details Patient Name: Date of Service: [name] 06/14/2022 9:00 A M Medical Record Number: [id number] Patient Account Number: [id number] Date of Birth/Sex: Treating RN: 05-06-1941 (81 y.o. Bayard Hugger,  Linda Primary Care Tattiana Fakhouri: Jerl Mina Other Clinician: Referring Srihari Shellhammer: Treating Tristan Proto/Extender: Cinda Quest in Treatment: 0 Wound Status Wound Number: 1 Primary Open Surgical Wound Etiology: Wound Location: Left, Lateral Lower Leg Wound Open Wounding Event: Surgical Injury Status: Date Acquired: 05/09/2022 Comorbid  Cataracts, Arrhythmia, Deep Vein Thrombosis, Hypertension, Weeks Of Treatment: 0 History: Osteoarthritis, Neuropathy, Seizure Disorder Clustered Wound: Yes Photos Wound Measurements Length: (cm) 10.4 Width: (cm) 1.6 Depth: (cm) 0.8 Clustered Quantity: 3 Area: (cm) 13.069 Volume: (cm) 10.455 % Reduction in Area: 0% % Reduction in Volume: 0% Epithelialization: Small (1-33%) Tunneling: No Undermining: No Wound Description Classification: Full Thickness With Exposed Support Structures Wound Margin: Distinct, outline attached Exudate Amount: Medium Exudate Type: Serosanguineous Exudate Color: red, brown Foul Odor After Cleansing: No Slough/Fibrino Yes Wound Bed Granulation Amount: Medium (34-66%) Exposed Structure Granulation Quality: Red Fascia Exposed: No Necrotic Amount: Medium (34-66%) Fat Layer (Subcutaneous Tissue) Exposed: Yes Necrotic Quality: Adherent Slough Tendon Exposed: No Muscle Exposed: No Joint Exposed: No Bone Exposed: Yes Electronic Signature(s) Signed: 06/14/2022 5:41:34 PM By: Zenaida Deed RN, BSN Entered By: Zenaida Deed on 06/14/2022 10:20:18 -------------------------------------------------------------------------------- Vitals Details Patient Name: Date of Service: [name] 06/14/2022 9:00 A M Medical Record Number: [id number] Patient Account Number: [id number] Date of Birth/Sex: Treating RN: June 09, 1941 (81 y.o. [name] Primary Care Rozell Kettlewell: Jerl Mina Other Clinician: Referring Sieara Bremer: Treating Robi Dewolfe/Extender: Cinda Quest in Treatment: 0 Vital Signs Time Taken: 09:42 Temperature (F): 97.9 Height (in): 72 Pulse (bpm): 79 Source: Stated Respiratory Rate (breaths/min): 18 Weight (lbs): 205 Blood Pressure (mmHg): 196/85 Source: Stated Reference Range: 80 - 120 mg / dl Body Mass Index (BMI): 27.8 Electronic Signature(s) Signed: 06/14/2022 5:41:34 PM By: Zenaida Deed  RN, BSN Entered By: Zenaida Deed on 06/14/2022 09:43:18

## 2022-06-14 NOTE — Progress Notes (Signed)
DANEIL, BEEM ([id number]) Visit Report for 06/14/2022 Abuse Risk Screen Details Patient Name: Date of Service: [name], [name] 06/14/2022 9:00 A M Medical Record Number: [id number] Patient Account Number: [id number] Date of Birth/Sex: Treating RN: [birth year] (81 y.o. [name] Primary Care Teretha Chalupa: [name] Other Clinician: Referring Shay Jhaveri: Treating Bartow Zylstra/Extender: Lenox Ponds in Treatment: 0 Abuse Risk Screen Items Answer ABUSE RISK SCREEN: Has anyone close to you tried to hurt or harm you recentlyo No Do you feel uncomfortable with anyone in your familyo No Has anyone forced you do things that you didnt want to doo No Electronic Signature(s) Signed: 06/14/2022 5:41:34 PM By: Baruch Gouty RN, BSN Entered By: Baruch Gouty on 06/14/2022 10:03:04 -------------------------------------------------------------------------------- Activities of Daily Living Details Patient Name: Date of Service: [name], Zachary E. 06/14/2022 9:00 [place] Record Number: [id number] Patient Account Number: [id number] Date of Birth/Sex: Treating RN: July 17, 1941 (81 y.o. [name] Primary Care Nazaiah Navarrete: [name] Other Clinician: Referring Xaria Judon: Treating Emaley Applin/Extender: Lenox Ponds in Treatment: 0 Activities of Daily Living Items Answer Activities of Daily Living (Please select one for each item) Drive Automobile Not Able T Medications ake Completely Able Use T elephone Completely Able Care for Appearance Completely Able Use T oilet Completely Able Bath / Shower Completely Able Dress Self Completely Able Feed Self Completely Able Walk Need Assistance Get In / Out Bed Completely Able Housework Need Assistance Prepare Meals Need Assistance Handle Money Completely Able Shop for Self Need Assistance Electronic Signature(s) Signed: 06/14/2022 5:41:34 PM By: Baruch Gouty RN,  BSN Entered By: Baruch Gouty on 06/14/2022 10:03:46 -------------------------------------------------------------------------------- Education Screening Details Patient Name: Date of Service: Zachary Alto E. 06/14/2022 9:00 [place] Record Number: [id number] Patient Account Number: [id number] Date of Birth/Sex: Treating RN: 04/24/1941 (81 y.o. [name] Primary Care Senia Even: [name] Other Clinician: Referring Stclair Szymborski: Treating Deleon Passe/Extender: Lenox Ponds in Treatment: 0 Primary Learner Assessed: Patient Learning Preferences/Education Level/Primary Language Learning Preference: Explanation, Demonstration, Printed Material Highest Education Level: College or Above Preferred Language: English Cognitive Barrier Language Barrier: No Translator Needed: No Memory Deficit: No Emotional Barrier: No Cultural/Religious Beliefs Affecting Medical Care: No Physical Barrier Impaired Vision: Yes Glasses Impaired Hearing: No Decreased Hand dexterity: No Knowledge/Comprehension Knowledge Level: High Comprehension Level: High Ability to understand written instructions: High Ability to understand verbal instructions: High Motivation Anxiety Level: Calm Cooperation: Cooperative Education Importance: Acknowledges Need Interest in Health Problems: Asks Questions Perception: Coherent Willingness to Engage in Self-Management High Activities: Readiness to Engage in Self-Management High Activities: Electronic Signature(s) Signed: 06/14/2022 5:41:34 PM By: Baruch Gouty RN, BSN Entered By: Baruch Gouty on 06/14/2022 10:04:21 -------------------------------------------------------------------------------- Fall Risk Assessment Details Patient Name: Date of Service: Zachary Alto E. 06/14/2022 9:00 [place] Record Number: [id number] Patient Account Number: [id number] Date of Birth/Sex: Treating RN: October 05, 1940 (81 y.o. [name] Primary Care Mischa Pollard: [name] Other Clinician: Referring Karilynn Carranza: Treating Johnwilliam Shepperson/Extender: Lenox Ponds in Treatment: 0 Fall Risk Assessment Items Have you had 2 or more falls in the last 12 monthso 0 No Have you had any fall that resulted in injury in the last 12 monthso 0 Yes FALLS RISK SCREEN History of falling - immediate or within 3 months 0 No Secondary diagnosis (Do you have 2 or more medical diagnoseso) 0 No Ambulatory aid None/bed rest/wheelchair/nurse 0 No Crutches/cane/walker 15 Yes Furniture 0 No Intravenous therapy Access/Saline/Heparin  Lock 0 No Gait/Transferring Normal/ bed rest/ wheelchair 0 No Weak (short steps with or without shuffle, stooped but able to lift head while walking, may seek 10 Yes support from furniture) Impaired (short steps with shuffle, may have difficulty arising from chair, head down, impaired 0 No balance) Mental Status Oriented to own ability 0 Yes Electronic Signature(s) Signed: 06/14/2022 5:41:34 PM By: Zenaida Deed RN, BSN Entered By: Zenaida Deed on 06/14/2022 10:04:50 -------------------------------------------------------------------------------- Foot Assessment Details Patient Name: Date of Service: Zachary Clock E. 06/14/2022 9:00 A M Medical Record Number: [id number] Patient Account Number: [id number] Date of Birth/Sex: Treating RN: [birth year] (81 y.o. [name] Primary Care Lizmary Nader: Jerl Mina Other Clinician: Referring Klay Sobotka: Treating Gionna Polak/Extender: Cinda Quest in Treatment: 0 Foot Assessment Items Site Locations + = Sensation present, - = Sensation absent, C = Callus, U = Ulcer R = Redness, W = Warmth, M = Maceration, PU = Pre-ulcerative lesion F = Fissure, S = Swelling, D = Dryness Assessment Right: Left: Other Deformity: No No Prior Foot Ulcer: No No Prior Amputation: No No Charcot Joint: No  No Ambulatory Status: Ambulatory With Help Assistance Device: Walker Gait: Steady Electronic Signature(s) Signed: 06/14/2022 5:41:34 PM By: Zenaida Deed RN, BSN Entered By: Zenaida Deed on 06/14/2022 10:07:42 -------------------------------------------------------------------------------- Nutrition Risk Screening Details Patient Name: Date of Service: Zachary Clock E. 06/14/2022 9:00 A M Medical Record Number: [id number] Patient Account Number: [id number] Date of Birth/Sex: Treating RN: December 03, 1940 (81 y.o. [name] Primary Care Breionna Punt: Jerl Mina Other Clinician: Referring Belmont Valli: Treating Temika Sutphin/Extender: Cinda Quest in Treatment: 0 Height (in): 72 Weight (lbs): 205 Body Mass Index (BMI): 27.8 Nutrition Risk Screening Items Score Screening NUTRITION RISK SCREEN: I have an illness or condition that made me change the kind and/or amount of food I eat 0 No I eat fewer than two meals per day 0 No I eat few fruits and vegetables, or milk products 0 No I have three or more drinks of beer, liquor or wine almost every day 0 No I have tooth or mouth problems that make it hard for me to eat 0 No I don't always have enough money to buy the food I need 0 No I eat alone most of the time 0 No I take three or more different prescribed or over-the-counter drugs a day 1 Yes Without wanting to, I have lost or gained 10 pounds in the last six months 0 No I am not always physically able to shop, cook and/or feed myself 2 Yes Nutrition Protocols Good Risk Protocol Moderate Risk Protocol 0 Provide education on nutrition High Risk Proctocol Risk Level: Moderate Risk Score: 3 Electronic Signature(s) Signed: 06/14/2022 5:41:34 PM By: Zenaida Deed RN, BSN Entered By: Zenaida Deed on 06/14/2022 10:05:35

## 2022-06-14 NOTE — Progress Notes (Signed)
[name], [name] ([id number]) Visit Report for 06/14/2022 Problem List Details Patient Name: Date of Service: [name], [name] 06/14/2022 9:00 A M Medical Record Number: [id number] Patient Account Number: [id number] Date of Birth/Sex: Treating RN: 1941-06-15 (81 y.o. 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

## 2022-06-15 ENCOUNTER — Other Ambulatory Visit (INDEPENDENT_AMBULATORY_CARE_PROVIDER_SITE_OTHER): Payer: Self-pay | Admitting: Nurse Practitioner

## 2022-06-15 DIAGNOSIS — S91002S Unspecified open wound, left ankle, sequela: Secondary | ICD-10-CM

## 2022-06-16 ENCOUNTER — Ambulatory Visit (INDEPENDENT_AMBULATORY_CARE_PROVIDER_SITE_OTHER): Payer: Medicare Other

## 2022-06-16 ENCOUNTER — Encounter (INDEPENDENT_AMBULATORY_CARE_PROVIDER_SITE_OTHER): Payer: Self-pay | Admitting: Nurse Practitioner

## 2022-06-16 ENCOUNTER — Ambulatory Visit (INDEPENDENT_AMBULATORY_CARE_PROVIDER_SITE_OTHER): Payer: Medicare Other | Admitting: Nurse Practitioner

## 2022-06-16 VITALS — BP 131/58 | HR 66 | Resp 15

## 2022-06-16 DIAGNOSIS — I10 Essential (primary) hypertension: Secondary | ICD-10-CM | POA: Diagnosis not present

## 2022-06-16 DIAGNOSIS — I739 Peripheral vascular disease, unspecified: Secondary | ICD-10-CM | POA: Diagnosis not present

## 2022-06-16 DIAGNOSIS — I70202 Unspecified atherosclerosis of native arteries of extremities, left leg: Secondary | ICD-10-CM

## 2022-06-16 DIAGNOSIS — S91002S Unspecified open wound, left ankle, sequela: Secondary | ICD-10-CM | POA: Diagnosis not present

## 2022-06-17 ENCOUNTER — Encounter (INDEPENDENT_AMBULATORY_CARE_PROVIDER_SITE_OTHER): Payer: Self-pay | Admitting: Nurse Practitioner

## 2022-06-17 NOTE — Progress Notes (Signed)
Subjective:    Patient ID: [name], male    DOB: June 13, 1941, 81 y.o.   MRN: [id number] Chief Complaint  Patient presents with   Follow-up    Ref Yetta Barre consult disruption of wound left ankle    [name] is a 81 year old male who presents today as a referral due to a slow healing left ankle wound.  The wound is due to slow healing incision due to ankle fracture that was repaired with ORIF.  It was determined that the wound was having difficulty with healing due to the screw at the bottom of the incision.  Ultimately the patient's orthopedic surgeon decided to remove the plate and screws.  He has not had any issues since then but the patient is continuing to have a wound that is slow healing.  He recently went to wound care and started working with them.  He had his first session several days ago.  Today noninvasive studies show an ABI of 1.13 on the right and 0.90 on the left.  He has biphasic tibial artery waveforms bilaterally with slightly dampened toe waveforms bilaterally.    Review of Systems  Skin:  Positive for wound.  Neurological:  Positive for weakness.  All other systems reviewed and are negative.      Objective:   Physical Exam Vitals reviewed.  HENT:     Head: Normocephalic.  Cardiovascular:     Rate and Rhythm: Normal rate.  Pulmonary:     Effort: Pulmonary effort is normal.  Skin:    General: Skin is warm and dry.  Neurological:     Mental Status: He is alert and oriented to person, place, and time.     Motor: Weakness present.  Psychiatric:        Mood and Affect: Mood normal.        Behavior: Behavior normal.        Thought Content: Thought content normal.        Judgment: Judgment normal.     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 Jerl Mina, [facility] in [place]   Intracranial bleed [facility])    december 2010   Left leg DVT (HCC) 09/2019   Migraines    "2 in my life" (09/17/2012)   Mitral regurgitation    a. 01/2019 Echo: EF 55-60%, mild conc LVH. RVSP 59.71mmHg. Mildly dil LA. Mild to mod MR. Mild AI.   PAH (pulmonary artery hypertension) (HCC)    Permanent atrial fibrillation (HCC)    a. CHA2DS2VASc = 5-->No OAC 2/2 h/o SAH; b. 03/2019 Zio: permanent Afib - 78 bpm (46-135). Occas PVCs (1.4% burden).   Retina disorder    hx of torn retina   Stroke (HCC) 2002   LLE "just a little bit weaker" (09/17/2012)   Subarachnoid hemorrhage (HCC) 08/2009  Social History   Socioeconomic History   Marital status: Married    Spouse name: Not on file   Number of children: Not on file   Years of education: Not on file   Highest education level: Not on file  Occupational History   Not on file  Tobacco Use   Smoking status: Never   Smokeless tobacco: Never  Vaping Use   Vaping Use: Never used  Substance and Sexual Activity   Alcohol use: No   Drug use: No   Sexual activity: Not Currently  Other Topics Concern   Not on file  Social History Narrative   Lives at home with wife in private residence   Social Determinants of Health   Financial Resource Strain: Not on file  Food Insecurity: Not on file  Transportation Needs: Not on file  Physical Activity: Not on file  Stress: Not on file  Social Connections: Not on file  Intimate Partner Violence: Not on file    Past Surgical History:  Procedure Laterality Date   BLEPHAROPLASTY  1990's   "right eye"  (09/16/2012)   CARDIOVASCULAR STRESS TEST     nl perfusion w/o ischemia, EF 56%, bradycardia without syncope 04/15/10 (Dr. Harold Hedge)   CATARACT EXTRACTION W/ INTRAOCULAR LENS IMPLANT  1980's   "right" (09/17/2012)   COLONOSCOPY     06/26/09   EYE MUSCLE SURGERY  1980's   "right eye" (09/17/2012)   HARDWARE REMOVAL Left 05/09/2022   Procedure: HARDWARE REMOVAL LEFT ANKLE;  Surgeon: Juanell Fairly, MD;  Location: [facility] ORS;  Service: Orthopedics;  Laterality: Left;   INGUINAL HERNIA REPAIR  04/10/2011   "right" (09/17/2012)   JOINT REPLACEMENT     ORIF ANKLE FRACTURE Left 08/19/2021   Procedure: OPEN REDUCTION INTERNAL FIXATION (ORIF) ANKLE FRACTURE;  Surgeon: Juanell Fairly, MD;  Location: [facility] ORS;  Service: Orthopedics;  Laterality: Left;   PERIPHERAL VASCULAR THROMBECTOMY Left 10/27/2019   Procedure: PERIPHERAL VASCULAR THROMBECTOMY;  Surgeon: Annice Needy, MD;  Location: [facility] INVASIVE CV LAB;  Service: Cardiovascular;  Laterality: Left;   POSTERIOR LUMBAR FUSION  09/16/2012   "L4-5" (09/17/2012)   RETINAL DETACHMENT SURGERY  1980's   "right" (09/17/2012)   TOTAL KNEE ARTHROPLASTY  04/28/2010   "left" (09/17/2012)    Family History  Problem Relation Age of Onset   Heart disease Mother     Allergies  Allergen Reactions   Codeine Nausea Only and Other (See Comments)   Fentanyl Other (See Comments)    Given before knee surgery, blood pressure became extremely low   Regadenoson Other (See Comments)    Created cardiac arrest   Sulfamethoxazole-Trimethoprim Hives   Verapamil Other (See Comments)    Per patient: Medication has caused low heart rate and caused patient to have afib that he currently has   Acetaminophen Other (See Comments)    Pt states he feels hot when he takes acetaminophen       Latest Ref Rng & Units 05/09/2022    8:22 AM 09/12/2021    8:25 AM 09/10/2021   10:13 AM  CBC  WBC 4.0 - 10.5 K/uL 6.3  9.3  10.9   Hemoglobin 13.0 - 17.0 g/dL 8.0  37.1  06.2    Hematocrit 39.0 - 52.0 % 28.3  33.3  32.9   Platelets 150 - 400 K/uL 189  223  182       CMP     Component Value Date/Time   NA 136 05/09/2022 0822   K 5.2 (H)  05/09/2022 0822   CL 102 05/09/2022 0822   CO2 27 05/09/2022 0822   GLUCOSE 101 (H) 05/09/2022 0822   BUN 17 05/09/2022 0822   CREATININE 1.22 05/09/2022 0822   CALCIUM 9.3 05/09/2022 0822   PROT 5.2 (L) 09/06/2021 0707   PROT 6.6 06/24/2019 1452   ALBUMIN 2.4 (L) 09/06/2021 0707   AST 33 09/06/2021 0707   ALT 17 09/06/2021 0707   ALKPHOS 99 09/06/2021 0707   BILITOT 1.1 09/06/2021 0707   GFRNONAA 59 (L) 05/09/2022 0822   GFRAA >60 10/27/2019 0736     No results found.     Assessment & Plan:   1. PAD (peripheral artery disease) (HCC) Today noninvasive studies show that the patient does have some evidence of peripheral arterial disease however he should have adequate ability for wound healing.  Patient suggested with wound care it would be prudent to allow him to continue with wound care to see progression with wound healing.  We will have him return in approximately 8 weeks to evaluate wound that wound is healing.  If we are showing continued healing we will not proceed with any intervention however the wound healing is stagnant or possibly worsening, we will discuss angiogram.  2. Essential hypertension Continue antihypertensive medications as already ordered, these medications have been reviewed and there are no changes at this time.    Current Outpatient Medications on File Prior to Visit  Medication Sig Dispense Refill   acetaminophen (TYLENOL) 325 MG tablet Take 1-2 tablets (325-650 mg total) by mouth every 6 (six) hours as needed for mild pain (pain score 1-3 or temp > 100.5).     aliskiren (TEKTURNA) 300 MG tablet Take 300 mg by mouth daily.      apixaban (ELIQUIS) 2.5 MG TABS tablet Take 2.5 mg by mouth 2 (two) times daily.     carbamazepine (TEGRETOL) 200 MG tablet Take 200 mg by mouth 2 (two) times a  day.      docusate sodium (COLACE) 100 MG capsule Take 1 capsule (100 mg total) by mouth 2 (two) times daily. 10 capsule 0   doxazosin (CARDURA) 1 MG tablet Take 1 mg by mouth 2 (two) times daily.     furosemide (LASIX) 40 MG tablet Take 40 mg by mouth daily.     irbesartan (AVAPRO) 300 MG tablet Take 300 mg by mouth daily.     Omega-3 Fatty Acids (FISH OIL) 1200 MG CAPS Take 1,200 mg by mouth daily.     ondansetron (ZOFRAN) 4 MG tablet Take 1 tablet (4 mg total) by mouth every 8 (eight) hours as needed for nausea or vomiting. 20 tablet 0   pantoprazole (PROTONIX) 40 MG tablet Take 40 mg by mouth at bedtime.      senna (SENOKOT) 8.6 MG TABS tablet Take 1 tablet (8.6 mg total) by mouth 2 (two) times daily. 120 tablet 0   simvastatin (ZOCOR) 20 MG tablet Take 20 mg by mouth 2 (two) times daily.      vitamin B-12 (CYANOCOBALAMIN) 1000 MCG tablet Take 1,000 mcg by mouth daily.     furosemide (LASIX) 20 MG tablet Take 20 mg by mouth 2 (two) times daily. (Patient not taking: Reported on 06/16/2022)     oxyCODONE (OXY IR/ROXICODONE) 5 MG immediate release tablet Take 1 tablet (5 mg total) by mouth every 4 (four) hours as needed for severe pain. (Patient not taking: Reported on 06/16/2022) 20 tablet 0   sulfamethoxazole-trimethoprim (BACTRIM DS) 800-160 MG tablet Take 1  tablet by mouth 2 (two) times daily. (Patient not taking: Reported on 06/16/2022) 20 tablet 0   No current facility-administered medications on file prior to visit.    There are no Patient Instructions on file for this visit. No follow-ups on file.   Georgiana Spinner, NP

## 2022-06-19 DIAGNOSIS — Z86718 Personal history of other venous thrombosis and embolism: Secondary | ICD-10-CM | POA: Diagnosis not present

## 2022-06-19 DIAGNOSIS — E785 Hyperlipidemia, unspecified: Secondary | ICD-10-CM | POA: Diagnosis not present

## 2022-06-19 DIAGNOSIS — Z7901 Long term (current) use of anticoagulants: Secondary | ICD-10-CM | POA: Diagnosis not present

## 2022-06-19 DIAGNOSIS — T8149XD Infection following a procedure, other surgical site, subsequent encounter: Secondary | ICD-10-CM | POA: Diagnosis not present

## 2022-06-19 DIAGNOSIS — I10 Essential (primary) hypertension: Secondary | ICD-10-CM | POA: Diagnosis not present

## 2022-06-19 DIAGNOSIS — Z8781 Personal history of (healed) traumatic fracture: Secondary | ICD-10-CM | POA: Diagnosis not present

## 2022-06-19 DIAGNOSIS — T8132XD Disruption of internal operation (surgical) wound, not elsewhere classified, subsequent encounter: Secondary | ICD-10-CM | POA: Diagnosis not present

## 2022-06-19 DIAGNOSIS — M1990 Unspecified osteoarthritis, unspecified site: Secondary | ICD-10-CM | POA: Diagnosis not present

## 2022-06-19 DIAGNOSIS — Z9841 Cataract extraction status, right eye: Secondary | ICD-10-CM | POA: Diagnosis not present

## 2022-06-19 DIAGNOSIS — G40909 Epilepsy, unspecified, not intractable, without status epilepticus: Secondary | ICD-10-CM | POA: Diagnosis not present

## 2022-06-19 DIAGNOSIS — G629 Polyneuropathy, unspecified: Secondary | ICD-10-CM | POA: Diagnosis not present

## 2022-06-19 DIAGNOSIS — Z9181 History of falling: Secondary | ICD-10-CM | POA: Diagnosis not present

## 2022-06-19 DIAGNOSIS — I4891 Unspecified atrial fibrillation: Secondary | ICD-10-CM | POA: Diagnosis not present

## 2022-06-19 DIAGNOSIS — Z8719 Personal history of other diseases of the digestive system: Secondary | ICD-10-CM | POA: Diagnosis not present

## 2022-06-19 DIAGNOSIS — B9561 Methicillin susceptible Staphylococcus aureus infection as the cause of diseases classified elsewhere: Secondary | ICD-10-CM | POA: Diagnosis not present

## 2022-06-19 DIAGNOSIS — H547 Unspecified visual loss: Secondary | ICD-10-CM | POA: Diagnosis not present

## 2022-06-21 ENCOUNTER — Encounter (HOSPITAL_BASED_OUTPATIENT_CLINIC_OR_DEPARTMENT_OTHER): Payer: Medicare Other | Admitting: General Surgery

## 2022-06-21 DIAGNOSIS — T8131XA Disruption of external operation (surgical) wound, not elsewhere classified, initial encounter: Secondary | ICD-10-CM | POA: Diagnosis not present

## 2022-06-21 DIAGNOSIS — I10 Essential (primary) hypertension: Secondary | ICD-10-CM | POA: Diagnosis not present

## 2022-06-21 DIAGNOSIS — L97326 Non-pressure chronic ulcer of left ankle with bone involvement without evidence of necrosis: Secondary | ICD-10-CM | POA: Diagnosis not present

## 2022-06-22 NOTE — Progress Notes (Signed)
[name] ([id number]) Visit Report for 06/21/2022 Arrival Information Details Patient Name: Date of Service: [name], [name] 06/21/2022 1:15 PM Medical Record Number: [id number] Patient Account Number: [id number] Date of Birth/Sex: Treating RN: January 12, 1941 (81 y.o. Cline Cools Primary Care Demetrick Eichenberger: Jerl Mina Other Clinician: Referring Julicia Krieger: Treating Monette Omara/Extender: Cinda Quest in Treatment: 1 Visit Information History Since Last Visit Added or deleted any medications: No Patient Arrived: Wheel Chair Any new allergies or adverse reactions: No Arrival Time: 13:28 Had a fall or experienced change in No Accompanied By: wife activities of daily living that may affect Transfer Assistance: None risk of falls: Patient Identification Verified: Yes Signs or symptoms of abuse/neglect since last visito No Secondary Verification Process Completed: Yes Hospitalized since last visit: No Patient Requires Transmission-Based Precautions: No Implantable device outside of the clinic excluding No Patient Has Alerts: Yes cellular tissue based products placed in the center Patient Alerts: Patient on Blood Thinner since last visit: Has Dressing in Place as Prescribed: Yes Has Compression in Place as Prescribed: Yes Pain Present Now: No Electronic Signature(s) Signed: 06/21/2022 2:21:05 PM By: Redmond Pulling RN, BSN Entered By: Redmond Pulling on 06/21/2022 13:29:21 -------------------------------------------------------------------------------- Encounter Discharge Information Details Patient Name: Date of Service: Zachary Clock E. 06/21/2022 1:15 PM Medical Record Number: [id number] Patient Account Number: [id number] Date of Birth/Sex: Treating RN: [birth year] (81 y.o. Cline Cools Primary Care Suzzette Gasparro: Jerl Mina Other Clinician: Referring [employer]: Treating Junious Ragone/Extender: Cinda Quest in  Treatment: 1 Encounter Discharge Information Items Post Procedure Vitals Discharge Condition: Stable Temperature (F): 97.9 Ambulatory Status: Wheelchair Pulse (bpm): 66 Discharge Destination: Home Respiratory Rate (breaths/min): 18 Transportation: Private Auto Blood Pressure (mmHg): 201/109 Accompanied By: wife Schedule Follow-up Appointment: Yes Clinical Summary of Care: Patient Declined Electronic Signature(s) Signed: 06/21/2022 2:21:05 PM By: Redmond Pulling RN, BSN Entered By: Redmond Pulling on 06/21/2022 13:58:38 -------------------------------------------------------------------------------- Lower Extremity Assessment Details Patient Name: Date of Service: Zachary Clock E. 06/21/2022 1:15 PM Medical Record Number: [id number] Patient Account Number: [id number] Date of Birth/Sex: Treating RN: 1941/08/27 (81 y.o. Cline Cools Primary Care Najmo Pardue: Jerl Mina Other Clinician: Referring Nolton Denis: Treating Jailen Lung/Extender: Cinda Quest in Treatment: 1 Edema Assessment Assessed: [Left: No] [Right: No] Edema: [Left: Ye] [Right: s] Calf Left: Right: Point of Measurement: From Medial Instep 32.4 cm Ankle Left: Right: Point of Measurement: From Medial Instep 21.5 cm Vascular Assessment Pulses: Dorsalis Pedis Palpable: [Left:Yes] Electronic Signature(s) Signed: 06/21/2022 2:21:05 PM By: Redmond Pulling RN, BSN Entered By: Redmond Pulling on 06/21/2022 13:30:06 -------------------------------------------------------------------------------- Multi Wound Chart Details Patient Name: Date of Service: Zachary Clock E. 06/21/2022 1:15 PM Medical Record Number: [id number] Patient Account Number: [id number] Date of Birth/Sex: Treating RN: 01-Jun-1941 (81 y.o. [name] Primary Care Eular Panek: Jerl Mina Other Clinician: Referring Amado Andal: Treating Arlette Schaad/Extender: Cinda Quest in Treatment:  1 Photos: [N/A:N/A] Left, Lateral Lower Leg N/A N/A Wound Location: Surgical Injury N/A N/A Wounding Event: Open Surgical Wound N/A N/A Primary Etiology: Cataracts, Arrhythmia, Deep Vein N/A N/A Comorbid History: Thrombosis, Hypertension, Osteoarthritis, Neuropathy, Seizure Disorder 05/09/2022 N/A N/A Date Acquired: 1 N/A N/A Weeks of Treatment: Open N/A N/A Wound Status: No N/A N/A Wound Recurrence: Yes N/A N/A Clustered Wound: 3 N/A N/A Clustered Quantity: 8x1.6x0.4 N/A N/A Measurements L x W x D (cm) 10.053 N/A N/A A (cm) : rea 4.021 N/A N/A Volume (cm) : 23.10% N/A N/A % Reduction in Area:  61.50% N/A N/A % Reduction in Volume: Full Thickness With Exposed Support N/A N/A Classification: Structures Medium N/A N/A Exudate A mount: Serosanguineous N/A N/A Exudate Type: red, brown N/A N/A Exudate Color: Distinct, outline attached N/A N/A Wound Margin: Large (67-100%) N/A N/A Granulation A mount: Red N/A N/A Granulation Quality: Small (1-33%) N/A N/A Necrotic A mount: Fat Layer (Subcutaneous Tissue): Yes N/A N/A Exposed Structures: Bone: Yes Fascia: No Tendon: No Muscle: No Joint: No Small (1-33%) N/A N/A Epithelialization: Debridement - Selective/Open Wound N/A N/A Debridement: Pre-procedure Verification/Time Out 13:43 N/A N/A Taken: Lidocaine 5% topical ointment N/A N/A Pain Control: Slough N/A N/A Tissue Debrided: Non-Viable Tissue N/A N/A Level: 12.8 N/A N/A Debridement A (sq cm): rea Curette N/A N/A Instrument: Minimum N/A N/A Bleeding: Pressure N/A N/A Hemostasis A chieved: 0 N/A N/A Procedural Pain: 0 N/A N/A Post Procedural Pain: Procedure was tolerated well N/A N/A Debridement Treatment Response: 8x1.6x0.4 N/A N/A Post Debridement Measurements L x W x D (cm) 4.021 N/A N/A Post Debridement Volume: (cm) Debridement N/A N/A Procedures Performed: Treatment Notes Wound #1 (Lower Leg) Wound Laterality: Left,  Lateral Cleanser Peri-Wound Care Sween Lotion (Moisturizing lotion) Discharge Instruction: Apply moisturizing lotion as directed Topical Primary Dressing KerraCel Ag Gelling Fiber Dressing, 4x5 in (silver alginate) Discharge Instruction: Apply silver alginate to wound bed as instructed Secondary Dressing ABD Pad, 5x9 Discharge Instruction: Apply over primary dressing as directed. Woven Gauze Sponge, Non-Sterile 4x4 in Discharge Instruction: Apply over primary dressing as directed. Secured With Paper Tape, 2x10 (in/yd) Discharge Instruction: Secure dressing with tape as directed. Compression Wrap Kerlix Roll 4.5x3.1 (in/yd) Discharge Instruction: Apply Kerlix and Coban compression as directed. Coban Self-Adherent Wrap 4x5 (in/yd) Discharge Instruction: Apply over Kerlix as directed. Compression Stockings Add-Ons Electronic Signature(s) Signed: 06/21/2022 1:49:49 PM By: Fredirick Maudlin MD FACS Signed: 06/22/2022 5:24:26 PM By: Baruch Gouty RN, BSN Entered By: Fredirick Maudlin on 06/21/2022 13:49:49 -------------------------------------------------------------------------------- Multi-Disciplinary Care Plan Details Patient Name: Date of Service: Harley Alto E. 06/21/2022 1:15 PM Medical Record Number: [id number] Patient Account Number: [id number] Date of Birth/Sex: Treating RN: Jan 23, 1941 (81 y.o. Mare Ferrari Primary Care Arista Kettlewell: [name] Other Clinician: Referring Tremaine Fuhriman: Treating Rhyli Depaula/Extender: Lenox Ponds in Treatment: 1 [place] reviewed with physician Active Inactive Wound/Skin Impairment Nursing Diagnoses: Impaired tissue integrity Knowledge deficit related to ulceration/compromised skin integrity Goals: Patient/caregiver will verbalize understanding of skin care regimen Date Initiated: 06/14/2022 Target Resolution Date: 07/12/2022 Goal Status: Active Ulcer/skin breakdown will have a volume  reduction of 30% by week 4 Date Initiated: 06/14/2022 Target Resolution Date: 07/12/2022 Goal Status: Active Interventions: Assess patient/caregiver ability to obtain necessary supplies Assess patient/caregiver ability to perform ulcer/skin care regimen upon admission and as needed Assess ulceration(s) every visit Provide education on ulcer and skin care Treatment Activities: Skin care regimen initiated : 06/14/2022 Topical wound management initiated : 06/14/2022 Notes: Electronic Signature(s) Signed: 06/21/2022 2:21:05 PM By: Sharyn Creamer RN, BSN Entered By: Sharyn Creamer on 06/21/2022 13:37:30 -------------------------------------------------------------------------------- Pain Assessment Details Patient Name: Date of Service: Harley Alto E. 06/21/2022 1:15 PM Medical Record Number: [id number] Patient Account Number: [id number] Date of Birth/Sex: Treating RN: 1941-08-10 (81 y.o. Mare Ferrari Primary Care Dace Denn: [name] Other Clinician: Referring Damichael Hofman: Treating Bearl Talarico/Extender: Lenox Ponds in Treatment: 1 Active Problems Location of Pain Severity and Description of Pain Patient Has Paino No Site Locations Pain Management and Medication Current Pain Management: Electronic Signature(s) Signed: 06/21/2022 2:21:05 PM By: Sharyn Creamer  RN, BSN Entered By: Sharyn Creamer on 06/21/2022 13:30:17 -------------------------------------------------------------------------------- Patient/Caregiver Education Details Patient Name: Date of Service: Margarette Canada 9/27/2023andnbsp1:15 PM Medical Record Number: [id number] Patient Account Number: [id number] Date of Birth/Gender: Treating RN: 20-Dec-1940 (81 y.o. Mare Ferrari Primary Care Physician: [name] Other Clinician: Referring Physician: Treating Physician/Extender: Lenox Ponds in Treatment: 1 Education Assessment Education  Provided To: Patient Education Topics Provided Wound/Skin Impairment: Methods: Explain/Verbal Responses: State content correctly Electronic Signature(s) Signed: 06/21/2022 2:21:05 PM By: Sharyn Creamer RN, BSN Entered By: Sharyn Creamer on 06/21/2022 13:38:55 -------------------------------------------------------------------------------- Wound Assessment Details Patient Name: Date of Service: Harley Alto E. 06/21/2022 1:15 PM Medical Record Number: [id number] Patient Account Number: [id number] Date of Birth/Sex: Treating RN: 05-05-1941 (81 y.o. Mare Ferrari Primary Care Mekenna Finau: [name] Other Clinician: Referring Tanita Palinkas: Treating Isobel Eisenhuth/Extender: Lenox Ponds in Treatment: 1 Wound Status Wound Number: 1 Primary Open Surgical Wound Etiology: Wound Location: Left, Lateral Lower Leg Wound Open Wounding Event: Surgical Injury Status: Date Acquired: 05/09/2022 Comorbid Cataracts, Arrhythmia, Deep Vein Thrombosis, Hypertension, Weeks Of Treatment: 1 History: Osteoarthritis, Neuropathy, Seizure Disorder Clustered Wound: Yes Photos Wound Measurements Length: (cm) 8 Width: (cm) 1.6 Depth: (cm) 0.4 Clustered Quantity: 3 Area: (cm) 10.053 Volume: (cm) 4.021 % Reduction in Area: 23.1% % Reduction in Volume: 61.5% Epithelialization: Small (1-33%) Tunneling: No Undermining: No Wound Description Classification: Full Thickness With Exposed Support Structures Wound Margin: Distinct, outline attached Exudate Amount: Medium Exudate Type: Serosanguineous Exudate Color: red, brown Foul Odor After Cleansing: No Slough/Fibrino Yes Wound Bed Granulation Amount: Large (67-100%) Exposed Structure Granulation Quality: Red Fascia Exposed: No Necrotic Amount: Small (1-33%) Fat Layer (Subcutaneous Tissue) Exposed: Yes Necrotic Quality: Adherent Slough Tendon Exposed: No Muscle Exposed: No Joint Exposed: No Bone Exposed:  Yes Treatment Notes Wound #1 (Lower Leg) Wound Laterality: Left, Lateral Cleanser Peri-Wound Care Sween Lotion (Moisturizing lotion) Discharge Instruction: Apply moisturizing lotion as directed Topical Primary Dressing KerraCel Ag Gelling Fiber Dressing, 4x5 in (silver alginate) Discharge Instruction: Apply silver alginate to wound bed as instructed Secondary Dressing ABD Pad, 5x9 Discharge Instruction: Apply over primary dressing as directed. Woven Gauze Sponge, Non-Sterile 4x4 in Discharge Instruction: Apply over primary dressing as directed. Secured With Paper Tape, 2x10 (in/yd) Discharge Instruction: Secure dressing with tape as directed. Compression Wrap Kerlix Roll 4.5x3.1 (in/yd) Discharge Instruction: Apply Kerlix and Coban compression as directed. Coban Self-Adherent Wrap 4x5 (in/yd) Discharge Instruction: Apply over Kerlix as directed. Compression Stockings Add-Ons Electronic Signature(s) Signed: 06/21/2022 2:21:05 PM By: Sharyn Creamer RN, BSN Entered By: Sharyn Creamer on 06/21/2022 13:45:13 -------------------------------------------------------------------------------- [place] Details Patient Name: Date of Service: Harley Alto E. 06/21/2022 1:15 PM Medical Record Number: [id number] Patient Account Number: [id number] Date of Birth/Sex: Treating RN: 06/15/1941 (81 y.o. Mare Ferrari Primary Care Deldrick Linch: [name] Other Clinician: Referring Charon Smedberg: Treating Elnora Quizon/Extender: Lenox Ponds in Treatment: 1 Vital Signs Time Taken: 12:58 Temperature (F): 97.9 Height (in): 72 Pulse (bpm): 66 Weight (lbs): 205 Respiratory Rate (breaths/min): 18 Body Mass Index (BMI): 27.8 Blood Pressure (mmHg): 201/109 Reference Range: 80 - 120 mg / dl Notes pt states has taken BP meds today, MD notified. Rn instructed pt of danger of high bp and s/s stroke, to call EMS with s/s stroke, pt verb understanding Electronic  Signature(s) Signed: 06/21/2022 2:21:05 PM By: Sharyn Creamer RN, BSN Entered By: Sharyn Creamer on 06/21/2022 14:00:27

## 2022-06-22 NOTE — Progress Notes (Signed)
HERALDO, MIKULAK ([id number]) Visit Report for 06/21/2022 Chief Complaint Document Details Patient Name: Date of Service: NYHEIM, CHAFFER 06/21/2022 1:15 PM Medical Record Number: [id number] Patient Account Number: [id number] Date of Birth/Sex: Treating RN: Jul 09, 1941 (81 y.o. Ernestene Mention Primary Care Provider: [name] Other Clinician: Referring Provider: Treating Provider/Extender: Lenox Ponds in Treatment: 1 Information Obtained from: Patient Chief Complaint Patient presents to the wound care center with open non-healing surgical wound(s) Electronic Signature(s) Signed: 06/21/2022 1:51:25 PM By: Fredirick Maudlin MD FACS Entered By: Fredirick Maudlin on 06/21/2022 13:51:25 -------------------------------------------------------------------------------- Debridement Details Patient Name: Date of Service: Harley Alto E. 06/21/2022 1:15 PM Medical Record Number: [id number] Patient Account Number: [id number] Date of Birth/Sex: Treating RN: 1941/06/20 (81 y.o. Mare Ferrari Primary Care Provider: [name] Other Clinician: Referring Provider: Treating Provider/Extender: Lenox Ponds in Treatment: 1 Debridement Performed for Assessment: Wound #1 Left,Lateral Lower Leg Performed By: Physician Fredirick Maudlin, MD Debridement Type: Debridement Level of Consciousness (Pre-procedure): Awake and Alert Pre-procedure Verification/Time Out Yes - 13:43 Taken: Start Time: 13:43 Pain Control: Lidocaine 5% topical ointment T Area Debrided (L x W): otal 8 (cm) x 1.6 (cm) = 12.8 (cm) Tissue and other material debrided: Non-Viable, Slough, Slough Level: Non-Viable Tissue Debridement Description: Selective/Open Wound Instrument: Curette Bleeding: Minimum Hemostasis Achieved: Pressure Procedural Pain: 0 Post Procedural Pain: 0 Response to Treatment: Procedure was tolerated well Level of Consciousness (Post-  Awake and Alert procedure): Post Debridement Measurements of Total Wound Length: (cm) 8 Width: (cm) 1.6 Depth: (cm) 0.4 Volume: (cm) 4.021 Character of Wound/Ulcer Post Debridement: Improved Post Procedure Diagnosis Same as Pre-procedure Electronic Signature(s) Signed: 06/21/2022 2:21:05 PM By: Sharyn Creamer RN, BSN Signed: 06/21/2022 3:45:58 PM By: Fredirick Maudlin MD FACS Entered By: Sharyn Creamer on 06/21/2022 13:45:34 -------------------------------------------------------------------------------- HPI Details Patient Name: Date of Service: Harley Alto E. 06/21/2022 1:15 PM Medical Record Number: [id number] Patient Account Number: [id number] Date of Birth/Sex: Treating RN: [birth year] (81 y.o. Ernestene Mention Primary Care Provider: [name] Other Clinician: Referring Provider: Treating Provider/Extender: Lenox Ponds in Treatment: 1 History of Present Illness HPI Description: ADMISSION 06/14/2022 This is an 16 suffered a left bimalleolar ankle fracture in November 2022. He underwent ORIF of the fracture. His postoperative course was complicated by areas of wound dehiscence. On May 09, 2022, he was taken to the operating room by orthopedics again for hardware removal. Cultures taken from the operating room demonstrated methicillin sensitive Staph aureus. He is currently taking doxycycline. He is not diabetic, nor does he smoke. ABI in clinic today was 0.86; he is going to have formal ABIs performed this Friday at the vascular surgery clinic in [place]. There is a linear wound running down his lateral left ankle. There are interspersed bands of epithelium. The wound does probe to bone over the malleolus. There is some slough accumulation but also some good granulation tissue filling in the wound bed. There is a single nylon suture remaining in place. 06/21/2022: The wound measured a little bit smaller today. There is good granulation  tissue filling in from the depths of the wound. There is a small area where bone is still accessible with a probe. Minimal slough and biofilm accumulation. He has completed his course of oral antibiotics. He did have formal ABIs performed on Friday and they were normal bilaterally. Electronic Signature(s) Signed: 06/21/2022 1:52:17 PM By: Fredirick Maudlin MD FACS Entered By: Fredirick Maudlin on 06/21/2022 13:52:16 --------------------------------------------------------------------------------  Physical Exam Details Patient Name: Date of Service: ALFARD, LAVORGNA 06/21/2022 1:15 PM Medical Record Number: [id number] Patient Account Number: [id number] Date of Birth/Sex: Treating RN: 06-22-1941 (81 y.o. Ernestene Mention Primary Care Provider: [name] Other Clinician: Referring Provider: Treating Provider/Extender: Lenox Ponds in Treatment: 1 Constitutional No acute distress.Marland Kitchen Respiratory Normal work of breathing on room air.. Notes 06/21/2022: The wound measured a little bit smaller today. There is good granulation tissue filling in from the depths of the wound. There is a small area where bone is still accessible with a probe. Minimal slough and biofilm accumulation. Electronic Signature(s) Signed: 06/21/2022 1:52:49 PM By: Fredirick Maudlin MD FACS Entered By: Fredirick Maudlin on 06/21/2022 13:52:49 -------------------------------------------------------------------------------- Physician Orders Details Patient Name: Date of Service: Harley Alto E. 06/21/2022 1:15 PM Medical Record Number: [id number] Patient Account Number: [id number] Date of Birth/Sex: Treating RN: 1941-07-29 (81 y.o. 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 - Dr. Celine Ahr RM 1 Return A Anesthetic Wound #1 Left,Lateral Lower Leg (In clinic) Topical Lidocaine 4% applied to wound bed - prior to [employer] May shower with protection but do not get wound dressing(s) wet. Edema Control - Lymphedema / SCD / Other Elevate legs to the level of the heart or above for 30 minutes daily and/or when sitting, a frequency of: - throughout the day Avoid standing for long periods of time. Exercise regularly Home Health Wound #1 Left,Lateral Lower Leg Admit to Home Health for wound care. May utilize formulary equivalent dressing for wound treatment orders unless otherwise specified. New wound care orders this week; continue Home Health for wound care. May utilize formulary equivalent dressing for wound treatment orders unless otherwise specified. Dressing changes to be completed by [place] on Monday / Wednesday / Friday except when patient has scheduled visit at [facility]. Wound Treatment Wound #1 - Lower Leg Wound Laterality: Left, Lateral Peri-Wound Care: Sween Lotion (Moisturizing lotion) 3 x Per Week/30 Days Discharge Instructions: Apply moisturizing lotion as directed Prim Dressing: KerraCel Ag Gelling Fiber Dressing, 4x5 in (silver alginate) (Generic) 3 x Per Week/30 Days ary Discharge Instructions: Apply silver alginate to wound bed as instructed Secondary Dressing: ABD Pad, 5x9 (Generic) 3 x Per Week/30 Days Discharge Instructions: Apply over primary dressing as directed. Secondary Dressing: Woven Gauze Sponge, Non-Sterile 4x4 in (Generic) 3 x Per Week/30 Days Discharge Instructions: Apply over primary dressing as  directed. Secured With: Paper Tape, 2x10 (in/yd) 3 x Per Week/30 Days Discharge Instructions: Secure dressing with tape as directed. Compression Wrap: Kerlix Roll 4.5x3.1 (in/yd) (Generic) 3 x Per Week/30 Days Discharge Instructions: Apply Kerlix and Coban compression as directed. Compression Wrap: Coban Self-Adherent Wrap 4x5 (in/yd) (Generic) 3 x Per Week/30 Days Discharge Instructions: Apply over Kerlix as directed. Patient Medications llergies: codeine, fentanyl, regadenoson, verapamil, acetaminophen, Sulfa (Sulfonamide Antibiotics) A Notifications Medication Indication Start End prior to debridement 06/21/2022 lidocaine DOSE topical 5 % ointment - ointment topical Electronic Signature(s) Signed: 06/21/2022 3:45:58 PM By: Fredirick Maudlin MD FACS Entered By: Fredirick Maudlin on 06/21/2022 13:53:02 -------------------------------------------------------------------------------- Problem List Details Patient Name: Date of Service:  ARNIS, NAKANISHI E. 06/21/2022 1:15 PM Medical Record Number: [id number] Patient Account Number: [id number] Date of Birth/Sex: Treating RN: 03/16/1941 (81 y.o. 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 06/21/2022 1:15 PM Medical Record Number: [id number] Patient Account Number: [id number] Date of Birth/Sex: Treating RN: 10-20-1940 (81 y.o. Ernestene Mention Primary Care Provider: [name] Other Clinician: Referring Provider: Treating Provider/Extender: Lenox Ponds in Treatment: 1 Subjective Chief Complaint Information obtained from Patient Patient presents to the wound care center with open non-healing surgical wound(s) History of Present Illness (HPI) ADMISSION 06/14/2022 This is an 51 suffered a left bimalleolar ankle fracture in November 2022. He underwent ORIF of the fracture. His postoperative course was complicated by areas of wound dehiscence. On May 09, 2022, he was taken to the operating room by orthopedics again for hardware removal. Cultures taken from the operating room demonstrated methicillin sensitive Staph aureus. He is currently taking doxycycline. He is not diabetic, nor does he smoke. ABI in clinic today was 0.86; he is going to have formal ABIs performed this Friday at the vascular surgery clinic in [place]. There is a linear wound running down his lateral left ankle. There are interspersed bands of epithelium. The wound does probe to bone over the malleolus. There is some slough accumulation but also some good granulation tissue filling in the wound bed. There is a single nylon suture remaining in place. 06/21/2022: The wound measured a little bit smaller today. There is good granulation tissue filling in from the depths of the wound. There is a small area where bone is still accessible with a probe. Minimal slough and biofilm accumulation. He has completed his course of oral antibiotics. He did have formal ABIs performed on Friday and they were normal bilaterally. Patient History Information obtained from Patient, Chart. Family History Heart Disease - Mother,  Hypertension - Father, No family history of Cancer, Diabetes, Hereditary Spherocytosis, Kidney Disease, Lung Disease, Seizures, Stroke, Thyroid Problems, Tuberculosis. Social History Never smoker, Marital Status - Married, Alcohol Use - Never, Drug Use - No History, Caffeine Use - Daily - coffee. Medical History Eyes Patient has history of Cataracts - right eye extraction Denies history of Glaucoma, Optic Neuritis Cardiovascular Patient has history of Arrhythmia - afib, Deep Vein Thrombosis - left leg, Hypertension Endocrine Denies history of Type I Diabetes, Type II Diabetes Genitourinary Denies history of End Stage Renal Disease Integumentary (Skin) Denies history of History of Burn Musculoskeletal Patient has history of Osteoarthritis Denies history of Osteomyelitis Neurologic Patient has history of Neuropathy, Seizure Disorder - hx Oncologic Denies history of Received Chemotherapy, Received Radiation Psychiatric Denies history of Anorexia/bulimia, Confinement Anxiety Medical A Surgical History Notes nd Cardiovascular hyperlipidemia Gastrointestinal hx GI bleed Genitourinary hx pyelonephritis Neurologic hx cerebral hemorrhage Objective Constitutional No acute distress.Marland Kitchen Respiratory Normal work of breathing on room air.. General Notes: 06/21/2022: The wound measured a little bit smaller today. There  is good granulation tissue filling in from the depths of the wound. There is a small area where bone is still accessible with a probe. Minimal slough and biofilm accumulation. Integumentary (Hair, Skin) Wound #1 status is Open. Original cause of wound was Surgical Injury. The date acquired was: 05/09/2022. The wound has been in treatment 1 weeks. The wound is located on the Left,Lateral Lower Leg. The wound measures 8cm length x 1.6cm width x 0.4cm depth; 10.053cm^2 area and 4.021cm^3 volume. There is bone and Fat Layer (Subcutaneous Tissue) exposed. There is no tunneling or  undermining noted. There is a medium amount of serosanguineous drainage noted. The wound margin is distinct with the outline attached to the wound base. There is large (67-100%) red granulation within the wound bed. There is a small (1-33%) amount of necrotic tissue within the wound bed including Adherent Slough. 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 There was a Selective/Open Wound Non-Viable Tissue Debridement with a total area of 12.8 sq cm performed by Fredirick Maudlin, MD. With the following instrument(s): Curette to remove Non-Viable tissue/material. Material removed includes [facility] after achieving pain control using Lidocaine 5% topical ointment. No specimens were taken. A time out was conducted at 13:43, prior to the start of the procedure. A Minimum amount of bleeding was controlled with Pressure. The procedure was tolerated well with a pain level of 0 throughout and a pain level of 0 following the procedure. Post Debridement Measurements: 8cm length x 1.6cm width x 0.4cm depth; 4.021cm^3 volume. Character of Wound/Ulcer Post Debridement is improved. Post procedure Diagnosis Wound #1: Same as Pre-Procedure Plan Follow-up Appointments: Return Appointment in 1 week. - Dr. Celine Ahr RM 1 Anesthetic: Wound #1 Left,Lateral Lower Leg: (In clinic) Topical Lidocaine 4% applied to wound bed - prior to debridemnet Bathing/ Shower/ Hygiene: May shower with protection but do not get wound dressing(s) wet. Edema Control - Lymphedema / SCD / Other: Elevate legs to the level of the heart or above for 30 minutes daily and/or when sitting, a frequency of: - throughout the  day Avoid standing for long periods of time. Exercise regularly Home Health: Wound #1 Left,Lateral Lower Leg: Admit to Home Health for wound care. May utilize formulary equivalent dressing for wound treatment orders unless otherwise specified. New wound care orders this week; continue Home Health for wound care. May utilize formulary equivalent dressing for wound treatment orders unless otherwise specified. Dressing changes to be completed by [place] on Monday / Wednesday / Friday except when patient has scheduled visit at [facility]. The following medication(s) was prescribed: lidocaine topical 5 % ointment ointment topical for prior to debridement was prescribed at facility WOUND #1: - Lower Leg Wound Laterality: Left, Lateral Peri-Wound Care: Sween Lotion (Moisturizing lotion) 3 x Per Week/30 Days Discharge Instructions: Apply moisturizing lotion as directed Prim Dressing: KerraCel Ag Gelling Fiber Dressing, 4x5 in (silver alginate) (Generic) 3 x Per Week/30 Days ary Discharge Instructions: Apply silver alginate to wound bed as instructed Secondary Dressing: ABD Pad, 5x9 (Generic) 3 x Per Week/30 Days Discharge Instructions: Apply over primary dressing as directed. Secondary Dressing: Woven Gauze Sponge, Non-Sterile 4x4 in (Generic) 3 x Per Week/30 Days Discharge Instructions: Apply over primary dressing as directed. Secured With: Paper T ape,  2x10 (in/yd) 3 x Per Week/30 Days Discharge Instructions: Secure dressing with tape as directed. Com pression Wrap: Kerlix Roll 4.5x3.1 (in/yd) (Generic) 3 x Per Week/30 Days Discharge Instructions: Apply Kerlix and Coban compression as directed. Com pression Wrap: Coban Self-Adherent Wrap 4x5 (in/yd) (Generic) 3 x Per Week/30 Days Discharge Instructions: Apply over Kerlix as directed. 06/21/2022: The wound measured a little bit smaller today. There is good granulation tissue filling in from the depths of the wound. There is a small area  where bone is still accessible with a probe. Minimal slough and biofilm accumulation. Used a curette to debride slough and nonviable subcutaneous tissue from the wound. We will continue to use silver alginate with Kerlix and Coban wrap. I think ultimately, he would benefit from a skin substitute, such as TheraSkin. We will continue to keep this in mind as we go forward. Electronic Signature(s) Signed: 06/21/2022 1:53:41 PM By: Fredirick Maudlin MD FACS Entered By: Fredirick Maudlin on 06/21/2022 13:53:40 -------------------------------------------------------------------------------- HxROS Details Patient Name: Date of Service: Harley Alto E. 06/21/2022 1:15 PM Medical Record Number: [id number] Patient Account Number: [id number] Date of Birth/Sex: Treating RN: 06-14-1941 (81 y.o. Ernestene Mention Primary Care Provider: [name] Other Clinician: Referring Provider: Treating Provider/Extender: Lenox Ponds in Treatment: 1 Information Obtained From Patient Chart Eyes Medical History: Positive for: Cataracts - right eye extraction Negative for: Glaucoma; Optic Neuritis Cardiovascular Medical History: Positive for: Arrhythmia - afib; Deep Vein Thrombosis - left leg; Hypertension Past Medical History Notes: hyperlipidemia Gastrointestinal Medical History: Past Medical History Notes: hx GI bleed Endocrine Medical History: Negative for: Type I Diabetes; Type II Diabetes Genitourinary Medical History: Negative for: End Stage Renal Disease Past Medical History Notes: hx pyelonephritis Integumentary (Skin) Medical History: Negative for: History of Burn Musculoskeletal Medical History: Positive for: Osteoarthritis Negative for: Osteomyelitis Neurologic Medical History: Positive for: Neuropathy; Seizure Disorder - hx Past Medical History Notes: hx cerebral hemorrhage Oncologic Medical History: Negative for: Received Chemotherapy;  Received Radiation Psychiatric Medical History: Negative for: Anorexia/bulimia; Confinement Anxiety HBO Extended History Items Eyes: Cataracts Immunizations Pneumococcal Vaccine: Received Pneumococcal Vaccination: Yes Received Pneumococcal Vaccination On or After 60th Birthday: Yes Implantable Devices None Family and Social History Cancer: No; Diabetes: No; Heart Disease: Yes - Mother; Hereditary Spherocytosis: No; Hypertension: Yes - Father; Kidney Disease: No; Lung Disease: No; Seizures: No; Stroke: No; Thyroid Problems: No; Tuberculosis: No; Never smoker; Marital Status - Married; Alcohol Use: Never; Drug Use: No History; Caffeine Use: Daily - coffee; Financial Concerns: No; Food, Clothing or Shelter Needs: No; Support System Lacking: No; Transportation Concerns: Yes - caregiver provides rides in am Electronic Signature(s) Signed: 06/21/2022 3:45:58 PM By: Fredirick Maudlin MD FACS Signed: 06/22/2022 5:24:26 PM By: Baruch Gouty RN, BSN Entered By: Fredirick Maudlin on 06/21/2022 13:52:22 -------------------------------------------------------------------------------- [place] Details Patient Name: Date of Service: Harley Alto E. 06/21/2022 Medical Record Number: [id number] Patient Account Number: [id number] Date of Birth/Sex: Treating RN: [birth year] (81 y.o. 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 T81.31XS Disruption of external operation (surgical) wound, not elsewhere classified, sequela I10 Essential (primary) hypertension Facility Procedures CPT4 Code: [id number] Description: [phone] - DEBRIDE WOUND 1ST 20 SQ CM OR < ICD-10 Diagnosis Description L97.326 Non-pressure chronic ulcer of  left ankle with bone involvement without evidence Modifier: of  necrosis Quantity: 1 Physician Procedures : CPT4 Code Description Modifier [id number] - WC PHYS LEVEL 3 - EST PT 25 ICD-10 Diagnosis Description 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

## 2022-06-29 ENCOUNTER — Encounter (HOSPITAL_BASED_OUTPATIENT_CLINIC_OR_DEPARTMENT_OTHER): Payer: Medicare Other | Attending: General Surgery | Admitting: General Surgery

## 2022-06-29 DIAGNOSIS — I10 Essential (primary) hypertension: Secondary | ICD-10-CM | POA: Insufficient documentation

## 2022-06-29 DIAGNOSIS — Z86718 Personal history of other venous thrombosis and embolism: Secondary | ICD-10-CM | POA: Insufficient documentation

## 2022-06-29 DIAGNOSIS — L97326 Non-pressure chronic ulcer of left ankle with bone involvement without evidence of necrosis: Secondary | ICD-10-CM | POA: Insufficient documentation

## 2022-06-29 DIAGNOSIS — M1990 Unspecified osteoarthritis, unspecified site: Secondary | ICD-10-CM | POA: Insufficient documentation

## 2022-06-29 DIAGNOSIS — G629 Polyneuropathy, unspecified: Secondary | ICD-10-CM | POA: Insufficient documentation

## 2022-06-29 DIAGNOSIS — G40909 Epilepsy, unspecified, not intractable, without status epilepticus: Secondary | ICD-10-CM | POA: Diagnosis not present

## 2022-06-29 NOTE — Progress Notes (Signed)
PINCHOS, CORDOVES ([id number]) Visit Report for 06/29/2022 Chief Complaint Document Details Patient Name: Date of Service: [name], [name] 06/29/2022 8:15 A M Medical Record Number: [id number] Patient Account Number: [id number] Date of Birth/Sex: Treating RN: 05-15-1941 (81 y.o. M) Primary Care Provider: [name] Other Clinician: Referring Provider: Treating Provider/Extender: Lenox Ponds in Treatment: 2 Information Obtained from: Patient Chief Complaint Patient presents to the wound care center with open non-healing surgical wound(s) Electronic Signature(s) Signed: 06/29/2022 9:19:24 AM By: Fredirick Maudlin MD FACS Entered By: Fredirick Maudlin on 06/29/2022 09:19:24 -------------------------------------------------------------------------------- Debridement Details Patient Name: Date of Service: Zachary Alto E. 06/29/2022 8:15 A M Medical Record Number: [id number] Patient Account Number: [id number] Date of Birth/Sex: Treating RN: 1940-12-09 (81 y.o. Ernestene Mention Primary Care Provider: [name] Other Clinician: Referring Provider: Treating Provider/Extender: Lenox Ponds in Treatment: 2 Debridement Performed for Assessment: Wound #1 Left,Lateral Lower Leg Performed By: Physician Fredirick Maudlin, MD Debridement Type: Debridement Level of Consciousness (Pre-procedure): Awake and Alert Pre-procedure Verification/Time Out Yes - 09:10 Taken: Start Time: 09:10 Pain Control: Lidocaine 4% T opical Solution T Area Debrided (L x W): otal 5 (cm) x 1.5 (cm) = 7.5 (cm) Tissue and other material debrided: Non-Viable, Slough, Slough Level: Non-Viable Tissue Debridement Description: Selective/Open Wound Instrument: Curette Bleeding: Minimum Hemostasis Achieved: Pressure Procedural Pain: 0 Post Procedural Pain: 0 Response to Treatment: Procedure was tolerated well Level of Consciousness (Post- Awake and  Alert procedure): Post Debridement Measurements of Total Wound Length: (cm) 8.2 Width: (cm) 1.5 Depth: (cm) 0.7 Volume: (cm) 6.762 Character of Wound/Ulcer Post Debridement: Improved Post Procedure Diagnosis Same as Pre-procedure Notes scribed by Baruch Gouty, RN for Dr. Celine Ahr Electronic Signature(s) Signed: 06/29/2022 10:51:15 AM By: Fredirick Maudlin MD FACS Signed: 06/29/2022 6:18:14 PM By: Baruch Gouty RN, BSN Entered By: Baruch Gouty on 06/29/2022 09:13:45 -------------------------------------------------------------------------------- HPI Details Patient Name: Date of Service: Zachary Alto E. 06/29/2022 8:15 A M Medical Record Number: [id number] Patient Account Number: [id number] Date of Birth/Sex: Treating RN: 1941-02-28 (81 y.o. M) Primary Care Provider: [name] Other Clinician: Referring Provider: Treating Provider/Extender: Lenox Ponds in Treatment: 2 History of Present Illness HPI Description: ADMISSION 06/14/2022 This is an 24 suffered a left bimalleolar ankle fracture in November 2022. He underwent ORIF of the fracture. His postoperative course was complicated by areas of wound dehiscence. On May 09, 2022, he was taken to the operating room by orthopedics again for hardware removal. Cultures taken from the operating room demonstrated methicillin sensitive Staph aureus. He is currently taking doxycycline. He is not diabetic, nor does he smoke. ABI in clinic today was 0.86; he is going to have formal ABIs performed this Friday at the vascular surgery clinic in [place]. There is a linear wound running down his lateral left ankle. There are interspersed bands of epithelium. The wound does probe to bone over the malleolus. There is some slough accumulation but also some good granulation tissue filling in the wound bed. There is a single nylon suture remaining in place. 06/21/2022: The wound measured a little bit smaller  today. There is good granulation tissue filling in from the depths of the wound. There is a small area where bone is still accessible with a probe. Minimal slough and biofilm accumulation. He has completed his course of oral antibiotics. He did have formal ABIs performed on Friday and they were normal bilaterally. 06/29/2022: The wound continues to contract. The more proximal  and distal extensions of the incision have nearly closed. The main open portion of the wound continues to fill with granulation tissue. I am still able to probe to bone in 1 small area, but overall there has been good improvement. Electronic Signature(s) Signed: 06/29/2022 9:20:07 AM By: Fredirick Maudlin MD FACS Entered By: Fredirick Maudlin on 06/29/2022 09:20:06 -------------------------------------------------------------------------------- Physical Exam Details Patient Name: Date of Service: Zachary Alto E. 06/29/2022 8:15 A M Medical Record Number: [id number] Patient Account Number: [id number] Date of Birth/Sex: Treating RN: [birth year] (81 y.o. M) Primary Care Provider: [name] Other Clinician: Referring Provider: Treating Provider/Extender: Lenox Ponds in Treatment: 2 Constitutional Hypertensive, asymptomatic. . . . No acute distress.Marland Kitchen Respiratory Normal work of breathing on room air.. Notes 06/29/2022: The wound continues to contract. The more proximal and distal extensions of the incision have nearly closed. The main open portion of the wound continues to fill with granulation tissue. I am still able to probe to bone in 1 small area, but overall there has been good improvement. Electronic Signature(s) Signed: 06/29/2022 9:23:43 AM By: Fredirick Maudlin MD FACS Entered By: Fredirick Maudlin on 06/29/2022 09:23:43 -------------------------------------------------------------------------------- Physician Orders Details Patient Name: Date of Service: Zachary Alto E.  06/29/2022 8:15 A M Medical Record Number: [id number] Patient Account Number: [id number] Date of Birth/Sex: Treating RN: 31-Jul-1941 (81 y.o. 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 - Dr. Celine Ahr RM 1 Return A Anesthetic Wound #1 Left,Lateral Lower Leg (In clinic) Topical Lidocaine 4% applied to wound bed - prior to [employer] May shower with protection but do not get wound dressing(s) wet. Edema Control - Lymphedema / SCD / Other Elevate legs to the level of the heart or above for 30 minutes daily and/or when sitting, a frequency of: - throughout the day Avoid standing for long periods of time. Exercise regularly Home Health Wound #1 Left,Lateral Lower Leg No change in wound care orders this week; continue Home Health for wound care. May utilize formulary equivalent dressing for wound treatment orders unless otherwise specified. Dressing changes to be completed by [place] on Monday / Wednesday / Friday except when patient has scheduled visit at [facility]. Other Home Health Orders/Instructions: - [facility] Wound Treatment Wound #1 - Lower Leg Wound Laterality: Left, Lateral Peri-Wound Care: Sween Lotion (Moisturizing lotion) 3 x Per Week/30 Days Discharge Instructions: Apply moisturizing lotion as directed Prim Dressing: KerraCel Ag Gelling Fiber Dressing, 4x5 in (silver alginate) (Generic) 3 x Per Week/30 Days ary Discharge Instructions: Apply silver alginate to  wound bed as instructed Secondary Dressing: ABD Pad, 5x9 (Generic) 3 x Per Week/30 Days Discharge Instructions: Apply over primary dressing as directed. Secondary Dressing: Woven Gauze Sponge, Non-Sterile 4x4 in (Generic) 3 x Per Week/30 Days Discharge Instructions: Apply over primary dressing as directed. Compression Wrap: Kerlix Roll 4.5x3.1 (in/yd) (Generic) 3 x Per Week/30 Days Discharge Instructions: Apply Kerlix and Coban compression as directed. Compression Wrap: Coban Self-Adherent Wrap 4x5 (in/yd) (Generic) 3 x Per Week/30 Days Discharge Instructions: Apply over Kerlix as directed. Electronic Signature(s) Signed: 06/29/2022 10:51:15 AM By: Fredirick Maudlin MD FACS Entered  By: Fredirick Maudlin on 06/29/2022 09:23:54 -------------------------------------------------------------------------------- Problem List Details Patient Name: Date of Service: [name], [name] 06/29/2022 8:15 A M Medical Record Number: [id number] Patient Account Number: [id number] Date of Birth/Sex: Treating RN: [birth year] (81 y.o. 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 06/29/2022 8:15 A M Medical  Record Number: [id number] Patient Account Number: [id number] Date of Birth/Sex: Treating RN: 02-14-1941 (81 y.o. M) Primary Care Provider: [name] Other Clinician: Referring Provider: Treating Provider/Extender: Lenox Ponds in Treatment: 2 Subjective Chief Complaint Information obtained from Patient Patient presents to the wound care center with open non-healing surgical wound(s) History of Present Illness (HPI) ADMISSION 06/14/2022 This is an 44 suffered a left bimalleolar ankle fracture in November 2022. He underwent ORIF of the fracture. His postoperative course was complicated by areas of wound dehiscence. On May 09, 2022, he was taken to the operating room by orthopedics again for hardware removal. Cultures taken from the operating room demonstrated methicillin sensitive Staph aureus. He is currently taking doxycycline. He is not diabetic, nor does he smoke. ABI in clinic today was 0.86; he is going to have formal ABIs performed this Friday at the vascular surgery clinic in [place]. There is a linear wound running down his lateral left ankle. There are interspersed bands of epithelium. The wound does probe to bone over the malleolus. There is some slough accumulation but also some good granulation tissue filling in the wound bed. There is a single nylon suture remaining in place. 06/21/2022: The wound measured a little bit smaller today. There is good granulation tissue filling in from the depths of the wound. There is a small area where bone is still accessible with a probe. Minimal slough and biofilm accumulation. He has completed his course of oral antibiotics. He did have formal ABIs performed on Friday and they were normal bilaterally. 06/29/2022: The wound continues to contract. The more proximal and distal extensions of the incision have nearly closed. The main open portion of the wound continues to fill with granulation tissue. I am still able  to probe to bone in 1 small area, but overall there has been good improvement. Patient History Information obtained from Patient, Chart. Family History Heart Disease - Mother, Hypertension - Father, No family history of Cancer, Diabetes, Hereditary Spherocytosis, Kidney Disease, Lung Disease, Seizures, Stroke, Thyroid Problems, Tuberculosis. Social History Never smoker, Marital Status - Married, Alcohol Use - Never, Drug Use - No History, Caffeine Use - Daily - coffee. Medical History Eyes Patient has history of Cataracts - right eye extraction Denies history of Glaucoma, Optic Neuritis Cardiovascular Patient has history of Arrhythmia - afib, Deep Vein Thrombosis - left leg, Hypertension Endocrine Denies history of Type I Diabetes, Type II Diabetes Genitourinary Denies history of End Stage Renal Disease Integumentary (Skin) Denies history of History of Burn Musculoskeletal Patient has history of Osteoarthritis Denies history of Osteomyelitis Neurologic Patient has history of Neuropathy,  Seizure Disorder - hx Oncologic Denies history of Received Chemotherapy, Received Radiation Psychiatric Denies history of Anorexia/bulimia, Confinement Anxiety Medical A Surgical History Notes nd Cardiovascular hyperlipidemia Gastrointestinal hx GI bleed Genitourinary hx pyelonephritis Neurologic hx cerebral hemorrhage Objective Constitutional Hypertensive, asymptomatic. No acute distress.. Vitals Time Taken: 9:32 AM, Height: 72 in, Weight: 205 lbs, BMI: 27.8, Temperature: 98 F, Pulse: 63 bpm, Respiratory Rate: 18 breaths/min, Blood Pressure: 175/77 mmHg. Respiratory Normal work of breathing on room air.. General Notes: 06/29/2022: The wound continues to contract. The more proximal and distal extensions of the incision have nearly closed. The main open portion of the wound continues to fill with granulation tissue. I am still able to probe to bone in 1 small area, but overall there has  been good improvement. Integumentary (Hair, Skin) Wound #1 status is Open. Original cause of wound was Surgical Injury. The date acquired was: 05/09/2022. The wound has been in treatment 2 weeks. The wound is located on the Left,Lateral Lower Leg. The wound measures 8.2cm length x 1.5cm width x 0.7cm depth; 9.66cm^2 area and 6.762cm^3 volume. There is Fat Layer (Subcutaneous Tissue) exposed. There is no tunneling or undermining noted. There is a medium amount of serosanguineous drainage noted. The wound margin is distinct with the outline attached to the wound base. There is large (67-100%) red, hyper - granulation within the wound bed. There is a small (1- 33%) amount of necrotic tissue within the wound bed including Adherent Slough. The periwound skin appearance had no abnormalities noted for texture. The periwound skin appearance had no abnormalities noted for moisture. The periwound skin appearance exhibited: Erythema. The surrounding wound skin color is noted with erythema. Erythema is marked. Periwound temperature was noted as No Abnormality. The periwound has tenderness on palpation. 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 There was a Selective/Open Wound Non-Viable Tissue Debridement with a total area of 7.5 sq cm performed by Fredirick Maudlin, MD. With the following instrument(s): Curette to remove Non-Viable tissue/material. Material removed includes [facility] after achieving pain control using Lidocaine 4% Topical Solution. No specimens were taken. A time out was conducted at 09:10, prior to the start of the procedure. A Minimum amount of bleeding was controlled with  Pressure. The procedure was tolerated well with a pain level of 0 throughout and a pain level of 0 following the procedure. Post Debridement Measurements: 8.2cm length x 1.5cm width x 0.7cm depth; 6.762cm^3 volume. Character of Wound/Ulcer Post Debridement is improved. Post procedure Diagnosis Wound #1: Same as Pre-Procedure General Notes: scribed by Baruch Gouty, RN for Dr. Celine Ahr. Plan Follow-up Appointments: Return Appointment in 1 week. - Dr. Celine Ahr RM 1 Anesthetic: Wound #1 Left,Lateral Lower Leg: (In clinic) Topical Lidocaine 4% applied to wound bed - prior to debridemnet Bathing/ Shower/ Hygiene: May shower with protection but do not get wound dressing(s) wet. Edema Control - Lymphedema / SCD / Other: Elevate legs to the level of the heart or above for 30 minutes daily and/or when sitting, a frequency of: - throughout the day Avoid standing for long periods of time. Exercise regularly Home Health: Wound #1 Left,Lateral Lower Leg: No change in wound care orders this week; continue Home Health for wound care. May utilize formulary equivalent dressing for wound treatment orders unless  otherwise specified. Dressing changes to be completed by [place] on Monday / Wednesday / Friday except when patient has scheduled visit [facility]. Other Home Health Orders/Instructions: - [facility] WOUND #1: - Lower Leg Wound Laterality: Left, Lateral Peri-Wound Care: Sween Lotion (Moisturizing lotion) 3 x Per Week/30 Days Discharge Instructions: Apply moisturizing lotion as directed Prim Dressing: KerraCel Ag Gelling Fiber Dressing, 4x5 in (silver alginate) (Generic) 3 x Per Week/30 Days ary Discharge Instructions: Apply silver alginate to wound bed as instructed Secondary Dressing: ABD Pad, 5x9 (Generic) 3 x Per Week/30 Days Discharge Instructions: Apply over primary dressing as directed. Secondary Dressing: Woven Gauze Sponge, Non-Sterile 4x4 in (Generic) 3 x Per Week/30  Days Discharge Instructions: Apply over primary dressing as directed. Com pression Wrap: Kerlix Roll 4.5x3.1 (in/yd) (Generic) 3 x Per Week/30 Days Discharge Instructions: Apply Kerlix and Coban compression as directed. Com pression Wrap: Coban Self-Adherent Wrap 4x5 (in/yd) (Generic) 3 x Per Week/30 Days Discharge Instructions: Apply over Kerlix as directed. 06/29/2022: The wound continues to contract. The more proximal and distal extensions of the incision have nearly closed. The main open portion of the wound continues to fill with granulation tissue. I am still able to probe to bone in 1 small area, but overall there has been good improvement. I used a curette to debride slough and biofilm from the wound. We will continue to use silver alginate with Kerlix and Coban wrapping. Follow-up in 1 week. Electronic Signature(s) Signed: 06/29/2022 9:24:25 AM By: Fredirick Maudlin MD FACS Signed: 06/29/2022 9:24:25 AM By: Fredirick Maudlin MD FACS Entered By: Fredirick Maudlin on 06/29/2022 09:24:25 -------------------------------------------------------------------------------- HxROS Details Patient Name: Date of Service: Zachary Alto E. 06/29/2022 8:15 A M Medical Record Number: [id number] Patient Account Number: [id number] Date of Birth/Sex: Treating RN: June 09, 1941 (81 y.o. M) Primary Care Provider: [name] Other Clinician: Referring Provider: Treating Provider/Extender: Lenox Ponds in Treatment: 2 Information Obtained From Patient Chart Eyes Medical History: Positive for: Cataracts - right eye extraction Negative for: Glaucoma; Optic Neuritis Cardiovascular Medical History: Positive for: Arrhythmia - afib; Deep Vein Thrombosis - left leg; Hypertension Past Medical History Notes: hyperlipidemia Gastrointestinal Medical History: Past Medical History Notes: hx GI bleed Endocrine Medical History: Negative for: Type I Diabetes; Type II  Diabetes Genitourinary Medical History: Negative for: End Stage Renal Disease Past Medical History Notes: hx pyelonephritis Integumentary (Skin) Medical History: Negative for: History of Burn Musculoskeletal Medical History: Positive for: Osteoarthritis Negative for: Osteomyelitis Neurologic Medical History: Positive for: Neuropathy; Seizure Disorder - hx Past Medical History Notes: hx cerebral hemorrhage Oncologic Medical History: Negative for: Received Chemotherapy; Received Radiation Psychiatric Medical History: Negative for: Anorexia/bulimia; Confinement Anxiety HBO Extended History Items Eyes: Cataracts Immunizations Pneumococcal Vaccine: Received Pneumococcal Vaccination: Yes Received Pneumococcal Vaccination On or After 60th Birthday: Yes Implantable Devices None Family and Social History Cancer: No; Diabetes: No; Heart Disease: Yes - Mother; Hereditary Spherocytosis: No; Hypertension: Yes - Father; Kidney Disease: No; Lung Disease: No; Seizures: No; Stroke: No; Thyroid Problems: No; Tuberculosis: No; Never smoker; Marital Status - Married; Alcohol Use: Never; Drug Use: No History; Caffeine Use: Daily - coffee; Financial Concerns: No; Food, Clothing or Shelter Needs: No; Support System Lacking: No; Transportation Concerns: Yes - caregiver provides rides in am Electronic Signature(s) Signed: 06/29/2022 10:51:15 AM By: Fredirick Maudlin MD FACS Entered By: Fredirick Maudlin on 06/29/2022 09:20:12 -------------------------------------------------------------------------------- SuperBill Details Patient Name: Date of Service: Zachary Alto E. 06/29/2022 Medical Record Number: [id number] Patient Account Number:  [id number] Date of Birth/Sex: Treating RN: Dec 03, 1940 (81 y.o. 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

## 2022-06-29 NOTE — Progress Notes (Signed)
QUADRY, KAMPA ([id number]) Visit Report for 06/29/2022 Arrival Information Details Patient Name: Date of Service: INMAN, FETTIG 06/29/2022 8:15 A M Medical Record Number: [id number] Patient Account Number: [id number] Date of Birth/Sex: Treating RN: [birth year] (81 y.o. Ernestene Mention Primary Care Savion Washam: [name] Other Clinician: Referring Saadia Dewitt: Treating Katarina Riebe/Extender: Lenox Ponds in Treatment: 2 Visit Information History Since Last Visit Added or deleted any medications: No Patient Arrived: Wheel Chair Any new allergies or adverse reactions: No Arrival Time: 08:21 Had a fall or experienced change in No Accompanied By: spouse activities of daily living that may affect Transfer Assistance: None risk of falls: Patient Identification Verified: Yes Signs or symptoms of abuse/neglect since last visito No Secondary Verification Process Completed: Yes Hospitalized since last visit: No Patient Requires Transmission-Based Precautions: No Implantable device outside of the clinic excluding No Patient Has Alerts: Yes cellular tissue based products placed in the center Patient Alerts: Patient on Blood Thinner since last visit: Has Dressing in Place as Prescribed: Yes Has Compression in Place as Prescribed: Yes Pain Present Now: No Electronic Signature(s) Signed: 06/29/2022 6:18:14 PM By: Baruch Gouty RN, BSN Entered By: Baruch Gouty on 06/29/2022 08:32:04 -------------------------------------------------------------------------------- Encounter Discharge Information Details Patient Name: Date of Service: Harley Alto E. 06/29/2022 8:15 A M Medical Record Number: [id number] Patient Account Number: [id number] Date of Birth/Sex: Treating RN: 1941/02/27 (81 y.o. Ernestene Mention Primary Care Mindy Behnken: [name] Other Clinician: Referring Caterina Racine: Treating Meshelle Holness/Extender: Lenox Ponds in  Treatment: 2 Encounter Discharge Information Items Post Procedure Vitals Discharge Condition: Stable Temperature (F): 98 Ambulatory Status: Wheelchair Pulse (bpm): 63 Discharge Destination: Home Respiratory Rate (breaths/min): 18 Transportation: Private Auto Blood Pressure (mmHg): 175/77 Accompanied By: spouse Schedule Follow-up Appointment: Yes Clinical Summary of Care: Patient Declined Electronic Signature(s) Signed: 06/29/2022 6:18:14 PM By: Baruch Gouty RN, BSN Entered By: Baruch Gouty on 06/29/2022 09:27:29 -------------------------------------------------------------------------------- Lower Extremity Assessment Details Patient Name: Date of Service: Harley Alto E. 06/29/2022 8:15 A M Medical Record Number: [id number] Patient Account Number: [id number] Date of Birth/Sex: Treating RN: [birth year] (81 y.o. Ernestene Mention Primary Care Freddi Forster: [name] Other Clinician: Referring Pesach Frisch: Treating Iziah Cates/Extender: Lenox Ponds in Treatment: 2 Edema Assessment Assessed: [Left: No] [Right: No] Edema: [Left: Ye] [Right: s] Calf Left: Right: Point of Measurement: From Medial Instep 29.5 cm Ankle Left: Right: Point of Measurement: From Medial Instep 23.5 cm Vascular Assessment Pulses: Dorsalis Pedis Palpable: [Left:No] Electronic Signature(s) Signed: 06/29/2022 6:18:14 PM By: Baruch Gouty RN, BSN Entered By: Baruch Gouty on 06/29/2022 08:36:20 -------------------------------------------------------------------------------- Multi Wound Chart Details Patient Name: Date of Service: Harley Alto E. 06/29/2022 8:15 A M Medical Record Number: [id number] Patient Account Number: [id number] Date of Birth/Sex: Treating RN: 07-04-1941 (81 y.o. M) Primary Care Bunny Lowdermilk: [name] Other Clinician: Referring Caitlan Chauca: Treating Lankford Gutzmer/Extender: Lenox Ponds in Treatment: 2 Vital  Signs Height(in): 72 Pulse(bpm): 50 Weight(lbs): 205 Blood Pressure(mmHg): 175/77 Body Mass Index(BMI): 27.8 Temperature(F): 98 Respiratory Rate(breaths/min): 18 Photos: [N/A:N/A] Left, Lateral Lower Leg N/A N/A Wound Location: Surgical Injury N/A N/A Wounding Event: Open Surgical Wound N/A N/A Primary Etiology: Cataracts, Arrhythmia, Deep Vein N/A N/A Comorbid History: Thrombosis, Hypertension, Osteoarthritis, Neuropathy, Seizure Disorder 05/09/2022 N/A N/A Date Acquired: 2 N/A N/A Weeks of Treatment: Open N/A N/A Wound Status: No N/A N/A Wound Recurrence: Yes N/A N/A Clustered Wound: 3 N/A N/A Clustered Quantity: 8.2x1.5x0.7 N/A N/A Measurements L x W x  D (cm) 9.66 N/A N/A A (cm) : rea 6.762 N/A N/A Volume (cm) : 26.10% N/A N/A % Reduction in A rea: 35.30% N/A N/A % Reduction in Volume: Full Thickness With Exposed Support N/A N/A Classification: Structures Medium N/A N/A Exudate A mount: Serosanguineous N/A N/A Exudate Type: red, brown N/A N/A Exudate Color: Distinct, outline attached N/A N/A Wound Margin: Large (67-100%) N/A N/A Granulation A mount: Red, Hyper-granulation N/A N/A Granulation Quality: Small (1-33%) N/A N/A Necrotic A mount: Fat Layer (Subcutaneous Tissue): Yes N/A N/A Exposed Structures: Fascia: No Tendon: No Muscle: No Joint: No Bone: No Small (1-33%) N/A N/A Epithelialization: Debridement - Selective/Open Wound N/A N/A Debridement: Pre-procedure Verification/Time Out 09:10 N/A N/A Taken: Lidocaine 4% Topical Solution N/A N/A Pain Control: Slough N/A N/A Tissue Debrided: Non-Viable Tissue N/A N/A Level: 7.5 N/A N/A Debridement A (sq cm): rea Curette N/A N/A Instrument: Minimum N/A N/A Bleeding: Pressure N/A N/A Hemostasis A chieved: 0 N/A N/A Procedural Pain: 0 N/A N/A Post Procedural Pain: Procedure was tolerated well N/A N/A Debridement Treatment Response: 8.2x1.5x0.7 N/A N/A Post Debridement  Measurements L x W x D (cm) 6.762 N/A N/A Post Debridement Volume: (cm) No Abnormalities Noted N/A N/A Periwound Skin Texture: No Abnormalities Noted N/A N/A Periwound Skin Moisture: Erythema: Yes N/A N/A Periwound Skin Color: Marked N/A N/A Erythema Measurement: No Abnormality N/A N/A Temperature: Yes N/A N/A Tenderness on Palpation: Debridement N/A N/A Procedures Performed: Treatment Notes Electronic Signature(s) Signed: 06/29/2022 9:19:19 AM By: Duanne Guess MD FACS Entered By: Duanne Guess on 06/29/2022 09:19:19 -------------------------------------------------------------------------------- Multi-Disciplinary Care Plan Details Patient Name: Date of Service: [name] 06/29/2022 8:15 A M Medical Record Number: [id number] Patient Account Number: [id number] Date of Birth/Sex: Treating RN: 1941-05-16 (81 y.o. [name] Primary Care Khaila Velarde: Jerl Mina Other Clinician: Referring Jameyah Fennewald: Treating Darien Mignogna/Extender: Cinda Quest in Treatment: 2 Multidisciplinary Care Plan reviewed with physician Active Inactive Venous Leg Ulcer Nursing Diagnoses: Potential for venous Insuffiency (use before diagnosis confirmed) Goals: Patient will maintain optimal edema control Date Initiated: 06/29/2022 Target Resolution Date: 07/14/2022 Goal Status: Active Interventions: Assess peripheral edema status every visit. Compression as ordered Treatment Activities: T ordered outside of clinic : 06/29/2022 est Therapeutic compression applied : 06/29/2022 Notes: Wound/Skin Impairment Nursing Diagnoses: Impaired tissue integrity Knowledge deficit related to ulceration/compromised skin integrity Goals: Patient/caregiver will verbalize understanding of skin care regimen Date Initiated: 06/14/2022 Target Resolution Date: 07/12/2022 Goal Status: Active Ulcer/skin breakdown will have a volume reduction of 30% by week 4 Date  Initiated: 06/14/2022 Target Resolution Date: 07/12/2022 Goal Status: Active Interventions: Assess patient/caregiver ability to obtain necessary supplies Assess patient/caregiver ability to perform ulcer/skin care regimen upon admission and as needed Assess ulceration(s) every visit Provide education on ulcer and skin care Treatment Activities: Skin care regimen initiated : 06/14/2022 Topical wound management initiated : 06/14/2022 Notes: Electronic Signature(s) Signed: 06/29/2022 6:18:14 PM By: Zenaida Deed RN, BSN Entered By: Zenaida Deed on 06/29/2022 08:46:16 -------------------------------------------------------------------------------- Pain Assessment Details Patient Name: Date of Service: [name] 06/29/2022 8:15 A M Medical Record Number: [id number] Patient Account Number: [id number] Date of Birth/Sex: Treating RN: December 26, 1940 (81 y.o. [name] Primary Care Zayanna Pundt: Jerl Mina Other Clinician: Referring Amiliana Foutz: Treating Tida Saner/Extender: Cinda Quest in Treatment: 2 Active Problems Location of Pain Severity and Description of Pain Patient Has Paino No Patient Has Paino No Site Locations Rate the pain. Current Pain Level: 0 Pain Management and Medication Current Pain Management: Electronic  Signature(s) Signed: 06/29/2022 6:18:14 PM By: Zenaida Deed RN, BSN Entered By: Zenaida Deed on 06/29/2022 08:32:36 -------------------------------------------------------------------------------- Patient/Caregiver Education Details Patient Name: Date of Service: Wilson Singer 10/5/2023andnbsp8:15 A M Medical Record Number: [id number] Patient Account Number: [id number] Date of Birth/Gender: Treating RN: 06-08-1941 (81 y.o. [name] Primary Care Physician: Jerl Mina Other Clinician: Referring Physician: Treating Physician/Extender: Cinda Quest in Treatment:  2 Education Assessment Education Provided To: Patient Education Topics Provided Venous: Methods: Explain/Verbal Responses: Reinforcements needed, State content correctly Wound/Skin Impairment: Methods: Explain/Verbal Responses: Reinforcements needed, State content correctly Electronic Signature(s) Signed: 06/29/2022 6:18:14 PM By: Zenaida Deed RN, BSN Entered By: Zenaida Deed on 06/29/2022 08:48:06 -------------------------------------------------------------------------------- Wound Assessment Details Patient Name: Date of Service: [name] 06/29/2022 8:15 A M Medical Record Number: [id number] Patient Account Number: [id number] Date of Birth/Sex: Treating RN: 07/11/1941 (81 y.o. [name] Primary Care Anniah Glick: Jerl Mina Other Clinician: Referring Dezaray Shibuya: Treating Massie Cogliano/Extender: Cinda Quest in Treatment: 2 Wound Status Wound Number: 1 Primary Open Surgical Wound Etiology: Wound Location: Left, Lateral Lower Leg Wound Open Wounding Event: Surgical Injury Status: Date Acquired: 05/09/2022 Comorbid Cataracts, Arrhythmia, Deep Vein Thrombosis, Hypertension, Weeks Of Treatment: 2 History: Osteoarthritis, Neuropathy, Seizure Disorder Clustered Wound: Yes Photos Wound Measurements Length: (cm) 8.2 Width: (cm) 1.5 Depth: (cm) 0.7 Clustered Quantity: 3 Area: (cm) 9.66 Volume: (cm) 6.762 % Reduction in Area: 26.1% % Reduction in Volume: 35.3% Epithelialization: Small (1-33%) Tunneling: No Undermining: No Wound Description Classification: Full Thickness With Exposed Support Structures Wound Margin: Distinct, outline attached Exudate Amount: Medium Exudate Type: Serosanguineous Exudate Color: red, brown Foul Odor After Cleansing: No Slough/Fibrino Yes Wound Bed Granulation Amount: Large (67-100%) Exposed Structure Granulation Quality: Red, Hyper-granulation Fascia Exposed: No Necrotic Amount: Small  (1-33%) Fat Layer (Subcutaneous Tissue) Exposed: Yes Necrotic Quality: Adherent Slough Tendon Exposed: No Muscle Exposed: No Joint Exposed: No Bone Exposed: No Periwound Skin Texture Texture Color No Abnormalities Noted: Yes No Abnormalities Noted: No Erythema: Yes Moisture Erythema Measurement: Marked No Abnormalities Noted: Yes Temperature / Pain Temperature: No Abnormality Tenderness on Palpation: Yes Treatment Notes Wound #1 (Lower Leg) Wound Laterality: Left, Lateral Cleanser Peri-Wound Care Sween Lotion (Moisturizing lotion) Discharge Instruction: Apply moisturizing lotion as directed Topical Primary Dressing KerraCel Ag Gelling Fiber Dressing, 4x5 in (silver alginate) Discharge Instruction: Apply silver alginate to wound bed as instructed Secondary Dressing ABD Pad, 5x9 Discharge Instruction: Apply over primary dressing as directed. Woven Gauze Sponge, Non-Sterile 4x4 in Discharge Instruction: Apply over primary dressing as directed. Secured With Compression Wrap Kerlix Roll 4.5x3.1 (in/yd) Discharge Instruction: Apply Kerlix and Coban compression as directed. Coban Self-Adherent Wrap 4x5 (in/yd) Discharge Instruction: Apply over Kerlix as directed. Compression Stockings Add-Ons Electronic Signature(s) Signed: 06/29/2022 6:18:14 PM By: Zenaida Deed RN, BSN Entered By: Zenaida Deed on 06/29/2022 08:44:48 -------------------------------------------------------------------------------- Vitals Details Patient Name: Date of Service: [name] 06/29/2022 8:15 A M Medical Record Number: [id number] Patient Account Number: [id number] Date of Birth/Sex: Treating RN: May 21, 1941 (81 y.o. [name] Primary Care Oziah Vitanza: Jerl Mina Other Clinician: Referring Damier Disano: Treating Kirklin Mcduffee/Extender: Cinda Quest in Treatment: 2 Vital Signs Time Taken: 09:32 Temperature (F): 98 Height (in): 72 Pulse (bpm):  63 Weight (lbs): 205 Respiratory Rate (breaths/min): 18 Body Mass Index (BMI): 27.8 Blood Pressure (mmHg): 175/77 Reference Range: 80 - 120 mg / dl Electronic Signature(s) Signed: 06/29/2022 6:18:14 PM By: Zenaida Deed RN, BSN Entered By: Zenaida Deed on  06/29/2022 08:32:30 

## 2022-07-07 ENCOUNTER — Encounter (HOSPITAL_BASED_OUTPATIENT_CLINIC_OR_DEPARTMENT_OTHER): Payer: Medicare Other | Admitting: Internal Medicine

## 2022-07-07 DIAGNOSIS — L97326 Non-pressure chronic ulcer of left ankle with bone involvement without evidence of necrosis: Secondary | ICD-10-CM | POA: Diagnosis not present

## 2022-07-07 DIAGNOSIS — I10 Essential (primary) hypertension: Secondary | ICD-10-CM | POA: Diagnosis not present

## 2022-07-07 DIAGNOSIS — G629 Polyneuropathy, unspecified: Secondary | ICD-10-CM | POA: Diagnosis not present

## 2022-07-07 DIAGNOSIS — M1990 Unspecified osteoarthritis, unspecified site: Secondary | ICD-10-CM | POA: Diagnosis not present

## 2022-07-07 DIAGNOSIS — Z86718 Personal history of other venous thrombosis and embolism: Secondary | ICD-10-CM | POA: Diagnosis not present

## 2022-07-07 DIAGNOSIS — T8131XA Disruption of external operation (surgical) wound, not elsewhere classified, initial encounter: Secondary | ICD-10-CM | POA: Diagnosis not present

## 2022-07-07 DIAGNOSIS — G40909 Epilepsy, unspecified, not intractable, without status epilepticus: Secondary | ICD-10-CM | POA: Diagnosis not present

## 2022-07-10 NOTE — Progress Notes (Signed)
GAY, RAPE ([id number]) [id number] Page 1 of 8 Visit Report for 07/07/2022 Arrival Information Details Patient Name: Date of Service: [name], [name] 07/07/2022 7:30 A M Medical Record Number: [id number] Patient Account Number: [id number] Date of Birth/Sex: Treating RN: 05/27/1941 (81 y.o. Ernestene Mention Primary Care Karena Kinker: [name] Other Clinician: Referring Murray Guzzetta: Treating Handy Mcloud/Extender: Imelda Pillow in Treatment: 3 Visit Information History Since Last Visit Added or deleted any medications: No Patient Arrived: Wheel Chair Any new allergies or adverse reactions: No Arrival Time: 07:39 Had a fall or experienced change in No Accompanied By: spouse, caregiver activities of daily living that may affect Transfer Assistance: Manual risk of falls: Patient Identification Verified: Yes Signs or symptoms of abuse/neglect since last visito No Secondary Verification Process Completed: Yes Hospitalized since last visit: No Patient Requires Transmission-Based Precautions: No Implantable device outside of the clinic excluding No Patient Has Alerts: Yes cellular tissue based products placed in the center Patient Alerts: Patient on Blood Thinner since last visit: Has Dressing in Place as Prescribed: Yes Has Compression in Place as Prescribed: Yes Pain Present Now: No Electronic Signature(s) Signed: 07/10/2022 6:17:50 AM By: Baruch Gouty RN, BSN Entered By: Baruch Gouty on 07/07/2022 07:40:04 -------------------------------------------------------------------------------- Clinic Level of Care Assessment Details Patient Name: Date of Service: Zachary Alto Neal. 07/07/2022 7:30 A M Medical Record Number: [id number] Patient Account Number: [id number] Date of Birth/Sex: Treating RN: May 20, 1941 (81 y.o. Ernestene Mention Primary Care [place] Rosamond: [name] Other Clinician: Referring  Brielle Moro: Treating Makeya Hilgert/Extender: Imelda Pillow in Treatment: 3 Clinic Level of Care Assessment Items TOOL 4 Quantity Score []  - 0 Use when only an EandM is performed on FOLLOW-UP visit ASSESSMENTS - Nursing Assessment / Reassessment X- 1 10 Reassessment of Co-morbidities (includes updates in patient status) X- 1 5 Reassessment of Adherence to Treatment Plan ASSESSMENTS - Wound and Skin A ssessment / Reassessment X - Simple Wound Assessment / Reassessment - one wound 1 5 []  - 0 Complex Wound Assessment / Reassessment - multiple wounds []  - 0 Dermatologic / Skin Assessment (not related to wound area) ASSESSMENTS - Focused Assessment X- 1 5 Circumferential Edema Measurements - multi extremities []  - 0 Nutritional Assessment / Counseling / Intervention [name], [name] ([id number]) [id number] Page 2 of 8 X- 1 5 Lower Extremity Assessment (monofilament, tuning fork, pulses) []  - 0 Peripheral Arterial Disease Assessment (using hand held doppler) ASSESSMENTS - Ostomy and/or Continence Assessment and Care []  - 0 Incontinence Assessment and Management []  - 0 Ostomy Care Assessment and Management (repouching, etc.) PROCESS - Coordination of Care X - Simple Patient / Family Education for ongoing care 1 15 []  - 0 Complex (extensive) Patient / Family Education for ongoing care X- 1 10 Staff obtains Programmer, systems, Records, T Results / Process Orders est X- 1 10 Staff telephones HHA, Nursing Homes / Clarify orders / etc []  - 0 Routine Transfer to another Facility (non-emergent condition) []  - 0 Routine Hospital Admission (non-emergent condition) []  - 0 New Admissions / Biomedical engineer / Ordering NPWT Apligraf, etc. , []  - 0 Emergency Hospital Admission (emergent condition) X- 1 10 Simple Discharge Coordination []  - 0 Complex (extensive) Discharge Coordination PROCESS - Special Needs []  - 0 Pediatric / Minor Patient  Management []  - 0 Isolation Patient Management []  - 0 Hearing / Language / Visual special needs []  - 0 Assessment of Community assistance (transportation, D/C planning, etc.) []  - 0 Additional assistance / Altered mentation []  -  0 Support Surface(s) Assessment (bed, cushion, seat, etc.) INTERVENTIONS - Wound Cleansing / Measurement X - Simple Wound Cleansing - one wound 1 5 []  - 0 Complex Wound Cleansing - multiple wounds X- 1 5 Wound Imaging (photographs - any number of wounds) []  - 0 Wound Tracing (instead of photographs) X- 1 5 Simple Wound Measurement - one wound []  - 0 Complex Wound Measurement - multiple wounds INTERVENTIONS - Wound Dressings X - Small Wound Dressing one or multiple wounds 1 10 []  - 0 Medium Wound Dressing one or multiple wounds []  - 0 Large Wound Dressing one or multiple wounds []  - 0 Application of Medications - topical []  - 0 Application of Medications - injection INTERVENTIONS - Miscellaneous []  - 0 External ear exam []  - 0 Specimen Collection (cultures, biopsies, blood, body fluids, etc.) []  - 0 Specimen(s) / Culture(s) sent or taken to Lab for analysis []  - 0 Patient Transfer (multiple staff / / Similar devices) []  - 0 Simple Staple / Suture removal (25 or less) []  - 0 Complex Staple / Suture removal (26 or more) []  - 0 Hypo / Hyperglycemic Management (close monitor of Blood Glucose) [name], [name] ( ) [id number] Page 3 of 8 []  - 0 Ankle / Brachial Index (ABI) - do not check if billed separately X- 1 5 Vital Signs Has the patient been seen at the hospital within the last three years: Yes Total Score: 105 Level Of Care: New/Established - Level 3 Electronic Signature(s) Signed: 07/10/2022 6:17:50 AM By: RN, BSN Entered By: on 07/07/2022 08:14:23 -------------------------------------------------------------------------------- Lower Extremity  Assessment Details Patient Name: Date of Service: Neal. 07/07/2022 7:30 A M Medical Record Number: Patient Account Number: Nurse, adult Date of Birth/Sex: Treating RN: 1941-06-28 (81 y.o. Primary Care Kmari Halter: Kerby Less Other Clinician: Referring Aurther Harlin: Treating Keneth Borg/Extender: [id number] in Treatment: 3 Edema Assessment Assessed: 10-23-2002: No] [Right: No] Edema: [Left: Ye] [Right: s] Calf Left: Right: Point of Measurement: From Medial Instep 31 cm Ankle Left: Right: Point of Measurement: From Medial Instep 22 cm Vascular Assessment Pulses: Dorsalis Pedis Palpable: [Left:No] Electronic Signature(s) Signed: 07/10/2022 6:17:50 AM By: 07/12/2022 RN, BSN Entered By: Zenaida Deed on 07/07/2022 07:48:19 -------------------------------------------------------------------------------- Multi Wound Chart Details Patient Name: Date of Service: 07/09/2022 Neal. 07/07/2022 7:30 A M Medical Record Number: 07/09/2022 Patient Account Number: [id number] Date of Birth/Sex: Treating RN: 1941/09/09 (81 y.o. M) Primary Care Ruben Mahler: 97 Other Clinician: Referring Ariday Brinker: Treating Sudiksha Victor/Extender: [name] in Treatment: 3 Vital Signs Height(in): 72 Pulse(bpm): 52 Weight(lbs): 205 Blood Pressure(mmHg): 161/86 Body Mass Index(BMI): 27.8 [name], [name] (Shirlee More) [id number] Page 4 of 8 Temperature(F): 97.5 Respiratory Rate(breaths/min): 18 [1:Photos:] [N/A:N/A] Left, Lateral Lower Leg N/A N/A Wound Location: Surgical Injury N/A N/A Wounding Event: Open Surgical Wound N/A N/A Primary Etiology: Cataracts, Arrhythmia, Deep Vein N/A N/A Comorbid History: Thrombosis, Hypertension, Osteoarthritis, Neuropathy, Seizure Disorder 05/09/2022 N/A N/A Date Acquired: 3 N/A N/A Weeks of Treatment: Open N/A N/A Wound Status: No  N/A N/A Wound Recurrence: Yes N/A N/A Clustered Wound: 3 N/A N/A Clustered Quantity: 5.6x1.3x0.5 N/A N/A Measurements L x W x D (cm) 5.718 N/A N/A A (cm) : rea 2.859 N/A N/A Volume (cm) : 56.20% N/A N/A % Reduction in Area: 72.70% N/A N/A % Reduction in Volume: Full Thickness With Exposed Support N/A N/A Classification: Structures Medium N/A N/A Exudate Amount: Serosanguineous N/A N/A Exudate Type:  red, brown N/A N/A Exudate Color: Distinct, outline attached N/A N/A Wound Margin: Large (67-100%) N/A N/A Granulation Amount: Red, Hyper-granulation N/A N/A Granulation Quality: Small (1-33%) N/A N/A Necrotic Amount: Fat Layer (Subcutaneous Tissue): Yes N/A N/A Exposed Structures: Fascia: No Tendon: No Muscle: No Joint: No Bone: No Small (1-33%) N/A N/A Epithelialization: No Abnormalities Noted N/A N/A Periwound Skin Texture: No Abnormalities Noted N/A N/A Periwound Skin Moisture: Erythema: No N/A N/A Periwound Skin Color: No Abnormality N/A N/A Temperature: Yes N/A N/A Tenderness on Palpation: Treatment Notes Electronic Signature(s) Signed: 07/09/2022 7:19:32 PM By: Baltazar Najjar MD Entered By: Baltazar Najjar on 07/07/2022 08:21:44 -------------------------------------------------------------------------------- Multi-Disciplinary Care Plan Details Patient Name: Date of Service: Zachary Clock Neal. 07/07/2022 7:30 A M Medical Record Number: [id number] Patient Account Number: [id number] Date of Birth/Sex: Treating RN: 11-25-1940 (81 y.o. [name] Primary Care Raelee Rossmann: Jerl Mina Other Clinician: Referring Charae Depaolis: Treating Notnamed Scholz/Extender: Shirlee More in Treatment: 3 [name], [name] ([id number]) [id number] Page 5 of 8 Multidisciplinary Care Plan reviewed with physician Active Inactive Venous Leg Ulcer Nursing Diagnoses: Potential for venous Insuffiency (use before  diagnosis confirmed) Goals: Patient will maintain optimal edema control Date Initiated: 06/29/2022 Target Resolution Date: 07/14/2022 Goal Status: Active Interventions: Assess peripheral edema status every visit. Compression as ordered Treatment Activities: T ordered outside of clinic : 06/29/2022 est Therapeutic compression applied : 06/29/2022 Notes: Wound/Skin Impairment Nursing Diagnoses: Impaired tissue integrity Knowledge deficit related to ulceration/compromised skin integrity Goals: Patient/caregiver will verbalize understanding of skin care regimen Date Initiated: 06/14/2022 Target Resolution Date: 07/12/2022 Goal Status: Active Ulcer/skin breakdown will have a volume reduction of 30% by week 4 Date Initiated: 06/14/2022 Target Resolution Date: 07/12/2022 Goal Status: Active Interventions: Assess patient/caregiver ability to obtain necessary supplies Assess patient/caregiver ability to perform ulcer/skin care regimen upon admission and as needed Assess ulceration(s) every visit Provide education on ulcer and skin care Treatment Activities: Skin care regimen initiated : 06/14/2022 Topical wound management initiated : 06/14/2022 Notes: Electronic Signature(s) Signed: 07/10/2022 6:17:50 AM By: Zenaida Deed RN, BSN Entered By: Zenaida Deed on 07/07/2022 07:55:48 -------------------------------------------------------------------------------- Pain Assessment Details Patient Name: Date of Service: Zachary Clock Neal. 07/07/2022 7:30 A M Medical Record Number: [id number] Patient Account Number: [id number] Date of Birth/Sex: Treating RN: [birth year] (81 y.o. [name] Primary Care Sonu Kruckenberg: Jerl Mina Other Clinician: Referring Avanti Jetter: Treating Lindzie Boxx/Extender: Shirlee More in Treatment: 3 Active Problems Location of Pain Severity and Description of Pain Patient Has Paino No [name], [name] ([id number])  [id number] Page 6 of 8 Patient Has Paino No Site Locations Rate the pain. Current Pain Level: 0 Pain Management and Medication Current Pain Management: Electronic Signature(s) Signed: 07/10/2022 6:17:50 AM By: Zenaida Deed RN, BSN Entered By: Zenaida Deed on 07/07/2022 07:44:57 -------------------------------------------------------------------------------- Patient/Caregiver Education Details Patient Name: Date of Service: [name] 10/13/2023andnbsp7:30 A M Medical Record Number: [id number] Patient Account Number: [id number] Date of Birth/Gender: Treating RN: [birth year] (81 y.o. [name] Primary Care Physician: Jerl Mina Other Clinician: Referring Physician: Treating Physician/Extender: Shirlee More in Treatment: 3 Education Assessment Education Provided To: Patient Education Topics Provided Venous: Methods: Explain/Verbal Responses: Reinforcements needed, State content correctly Electronic Signature(s) Signed: 07/10/2022 6:17:50 AM By: Zenaida Deed RN, BSN Entered By: Zenaida Deed on 07/07/2022 07:56:01 -------------------------------------------------------------------------------- Wound Assessment Details Patient Name: Date of Service: Zachary Clock Neal. 07/07/2022 7:30 A M Medical Record Number: [id number] Patient Account Number: [id number] Date of  Birth/Sex: Treating RN: 1940/11/04 (81 y.o. Joakim, Huesman, Baldwin Neal ([id number]) [id number] Page 7 of 8 Primary Care Grant Henkes: Jerl Mina Other Clinician: Referring Vash Quezada: Treating [place] Zou/Extender: Shirlee More in Treatment: 3 Wound Status Wound Number: 1 Primary Open Surgical Wound Etiology: Wound Location: Left, Lateral Lower Leg Wound Open Wounding Event: Surgical Injury Status: Date Acquired: 05/09/2022 Comorbid Cataracts, Arrhythmia, Deep Vein  Thrombosis, Hypertension, Weeks Of Treatment: 3 History: Osteoarthritis, Neuropathy, Seizure Disorder Clustered Wound: Yes Photos Wound Measurements Length: (cm) Width: (cm) Depth: (cm) Clustered Quantity: Area: (cm) Volume: (cm) 5.6 % Reduction in Area: 56.2% 1.3 % Reduction in Volume: 72.7% 0.5 Epithelialization: Small (1-33%) 3 Tunneling: No 5.718 Undermining: No 2.859 Wound Description Classification: Full Thickness With Exposed Suppo Wound Margin: Distinct, outline attached Exudate Amount: Medium Exudate Type: Serosanguineous Exudate Color: red, brown rt Structures Foul Odor After Cleansing: No Slough/Fibrino Yes Wound Bed Granulation Amount: Large (67-100%) Exposed Structure Granulation Quality: Red, Hyper-granulation Fascia Exposed: No Necrotic Amount: Small (1-33%) Fat Layer (Subcutaneous Tissue) Exposed: Yes Necrotic Quality: Adherent Slough Tendon Exposed: No Muscle Exposed: No Joint Exposed: No Bone Exposed: No Periwound Skin Texture Texture Color No Abnormalities Noted: Yes No Abnormalities Noted: No Erythema: No Moisture No Abnormalities Noted: Yes Temperature / Pain Temperature: No Abnormality Tenderness on Palpation: Yes Electronic Signature(s) Signed: 07/10/2022 6:17:50 AM By: Zenaida Deed RN, BSN Entered By: Zenaida Deed on 07/07/2022 07:54:34 -------------------------------------------------------------------------------- Vitals Details Patient Name: Date of Service: Zachary Clock Neal. 07/07/2022 7:30 A Diona Foley ([id number]) [id number] Page 8 of 8 Medical Record Number: [id number] Patient Account Number: [id number] Date of Birth/Sex: Treating RN: 31-Jan-1941 (81 y.o. [name] Primary Care Priest Lockridge: Jerl Mina Other Clinician: Referring Aubreanna Percle: Treating Vaida Kerchner/Extender: Shirlee More in Treatment: 3 Vital Signs Time Taken: 07:40 Temperature (F):  97.5 Height (in): 72 Pulse (bpm): 52 Weight (lbs): 205 Respiratory Rate (breaths/min): 18 Body Mass Index (BMI): 27.8 Blood Pressure (mmHg): 161/86 Reference Range: 80 - 120 mg / dl Electronic Signature(s) Signed: 07/10/2022 6:17:50 AM By: Zenaida Deed RN, BSN Entered By: Zenaida Deed on 07/07/2022 07:44:49

## 2022-07-10 NOTE — Progress Notes (Signed)
THOMAS, MABRY ([id number]) [id number] Page 1 of 5 Visit Report for 07/07/2022 HPI Details Patient Name: Date of Service: [name], [name] 07/07/2022 7:30 A M Medical Record Number: [id number] Patient Account Number: [id number] Date of Birth/Sex: Treating RN: August 28, 1941 (81 y.o. M) Primary Care Provider: [name] Other Clinician: Referring Provider: Treating Provider/Extender: [name] in Treatment: 3 History of Present Illness HPI Description: ADMISSION 06/14/2022 This is an 55 suffered a left bimalleolar ankle fracture in November 2022. He underwent ORIF of the fracture. His postoperative course was complicated by areas of wound dehiscence. On May 09, 2022, he was taken to the operating room by orthopedics again for hardware removal. Cultures taken from the operating room demonstrated methicillin sensitive Staph aureus. He is currently taking doxycycline. He is not diabetic, nor does he smoke. ABI in clinic today was 0.86; he is going to have formal ABIs performed this Friday at the vascular surgery clinic in [place]. There is a linear wound running down his lateral left ankle. There are interspersed bands of epithelium. The wound does probe to bone over the malleolus. There is some slough accumulation but also some good granulation tissue filling in the wound bed. There is a single nylon suture remaining in place. 06/21/2022: The wound measured a little bit smaller today. There is good granulation tissue filling in from the depths of the wound. There is a small area where bone is still accessible with a probe. Minimal slough and biofilm accumulation. He has completed his course of oral antibiotics. He did have formal ABIs performed on Friday and they were normal bilaterally. 06/29/2022: The wound continues to contract. The Neal proximal and distal extensions of the incision have nearly closed. The main open portion  of the wound continues to fill with granulation tissue. I am still able to probe to bone in 1 small area, but overall there has been good improvement. 10/13; the wound continues to contract. Healthy granulation. The deepest parts of the distal wound are actually in the mid aspect. Smaller areas superiorly look superficial. Electronic Signature(s) Signed: 07/09/2022 7:19:32 PM By: Linton Ham MD Entered By: Linton Ham on 07/07/2022 08:22:57 -------------------------------------------------------------------------------- Physical Exam Details Patient Name: Date of Service: Zachary Alto E. 07/07/2022 7:30 A M Medical Record Number: [id number] Patient Account Number: [id number] Date of Birth/Sex: Treating RN: [birth year] (81 y.o. M) Primary Care Provider: [name] Other Clinician: Referring Provider: Treating Provider/Extender: [name] in Treatment: 3 Constitutional Patient is hypertensive.. Pulse regular and within target range for patient.Marland Kitchen Respirations regular, non-labored and within target range.. Temperature is normal and within the target range for the patient.Marland Kitchen Appears in no distress. Cardiovascular Lt palpable. Notes Pedal pulses are palpable.Left ankle 2 open areas small superficial superiorly. The major wound is inferiorly in the incision line. Healthy granulation deepest part of this is in 2 areas in the mid aspect of the wound how low this week and I am not able to probe to bone. He has had the hardware removed Electronic Signature(s) Signed: 07/09/2022 7:19:32 PM By: Linton Ham MD [name] ([id number]) [id number] Page 2 of 5 Entered By: Linton Ham on 07/07/2022 08:24:30 -------------------------------------------------------------------------------- Physician Orders Details Patient Name: Date of Service: [name], [name] 07/07/2022 7:30 A M Medical Record Number:  [id number] Patient Account Number: [id number] Date of Birth/Sex: Treating RN: [birth year] (81 y.o. 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 - Dr. Lady Gary RM 1 Return A Anesthetic Wound #1 Left,Lateral Lower Leg (In clinic) Topical Lidocaine 4% applied to wound bed - prior to [employer] May shower with protection but do not get wound dressing(s) wet. Edema Control - Lymphedema / SCD / Other Elevate legs to the level of the heart or above for 30 minutes daily and/or when sitting, a frequency of: - throughout the day Avoid standing for long periods of time. Exercise regularly Home Health Wound #1 Left,Lateral Lower Leg No change in wound care orders this week; continue Home Health for wound care. May utilize formulary equivalent dressing for wound treatment orders unless otherwise specified. Dressing changes to be completed by Home Health on Monday / Wednesday / Friday except when patient has scheduled visit at [facility]. Other Home Health Orders/Instructions: - [facility] Wound Treatment Wound #1 - Lower Leg Wound Laterality: Left, Lateral Peri-Wound Care: Sween Lotion (Moisturizing lotion) 3 x Per Week/30 Days Discharge Instructions: Apply moisturizing lotion as directed Prim Dressing: KerraCel Ag Gelling Fiber Dressing, 4x5 in (silver alginate) (Generic) 3 x Per Week/30 Days ary Discharge Instructions: Apply silver alginate to wound bed as instructed Secondary Dressing:  ABD Pad, 5x9 (Generic) 3 x Per Week/30 Days Discharge Instructions: Apply over primary dressing as directed. Secondary Dressing: Woven Gauze Sponge, Non-Sterile 4x4 in (Generic) 3 x Per Week/30 Days Discharge Instructions: Apply over primary dressing as directed. Compression Wrap: Kerlix Roll 4.5x3.1 (in/yd) (Generic) 3 x Per Week/30 Days Discharge Instructions: Apply Kerlix and Coban compression as directed. Compression Wrap: Coban Self-Adherent Wrap 4x5 (in/yd) (Generic) 3 x Per Week/30 Days Discharge Instructions: Apply over Kerlix as directed. Electronic Signature(s) Signed: 07/09/2022 7:19:32 PM By: Baltazar Najjar MD [name] ([id number]) [id number] Page 3 of 5 Signed: 07/10/2022 6:17:50 AM By: Zenaida Deed RN, BSN Entered By: Zenaida Deed on 07/07/2022 08:13:46 -------------------------------------------------------------------------------- Problem List Details Patient Name: Date of Service: Zachary Clock E. 07/07/2022 7:30 A M Medical Record Number: [id number] Patient Account Number: [id number] Date of Birth/Sex: Treating RN: [birth year] (81 y.o. 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 07/07/2022 7:30 A M Medical Record Number: [id number] Patient Account Number: [id number] Date of Birth/Sex: Treating RN: April 13, 1941 (81 y.o. M) Primary Care Provider: Jerl Neal Other Clinician: Referring Provider: Treating Provider/Extender: [name] in Treatment: 3 Subjective History of Present Illness (HPI) ADMISSION 06/14/2022 This is an 21 suffered a left bimalleolar ankle fracture in November 2022. He underwent ORIF of the fracture. His postoperative course was complicated by areas of wound dehiscence. On May 09, 2022, he was taken to the operating  room by orthopedics again for hardware removal. Cultures taken from the operating room demonstrated methicillin sensitive Staph aureus. He is currently taking doxycycline. He is not diabetic, nor does he smoke. ABI in clinic today was 0.86; he is going to have formal ABIs performed this Friday at the vascular surgery clinic in [place]. [name], [name] ([id number]) [id number] Page 4 of 5 There is a linear wound running down his lateral left ankle. There are interspersed bands of epithelium. The wound does probe to bone over the malleolus. There is some slough accumulation but also some good granulation tissue filling in the wound bed. There is a single nylon suture remaining in place. 06/21/2022: The wound measured a little bit smaller today. There is good granulation tissue filling in from the depths of the wound. There is a small area where bone is still accessible with a probe. Minimal slough and biofilm accumulation. He has completed his course of oral antibiotics. He did have formal ABIs performed on Friday and they were normal bilaterally. 06/29/2022: The wound continues to contract. The Neal proximal and distal extensions of the incision have nearly closed. The main open portion of the wound continues to  fill with granulation tissue. I am still able to probe to bone in 1 small area, but overall there has been good improvement. 10/13; the wound continues to contract. Healthy granulation. The deepest parts of the distal wound are actually in the mid aspect. Smaller areas superiorly look superficial. Objective Constitutional Patient is hypertensive.. Pulse regular and within target range for patient.Marland Kitchen Respirations regular, non-labored and within target range.. Temperature is normal and within the target range for the patient.Marland Kitchen Appears in no distress. Vitals Time Taken: 7:40 AM, Height: 72 in, Weight: 205 lbs, BMI: 27.8, Temperature: 97.5 F, Pulse: 52 bpm, Respiratory Rate: 18 breaths/min, Blood Pressure: 161/86 mmHg. Cardiovascular Lt palpable. General Notes: Pedal pulses are palpable.Left ankle 2 open areas small superficial superiorly. The major wound is inferiorly in the incision line. Healthy granulation deepest part of this is in 2 areas in the mid aspect of the wound how low this week and I am not able to probe to bone. He has had the hardware removed Integumentary (Hair, Skin) Wound #1 status is Open. Original cause of wound was Surgical Injury. The date acquired was: 05/09/2022. The wound has been in treatment 3 weeks. The wound is located on the Left,Lateral Lower Leg. The wound measures 5.6cm length x 1.3cm width x 0.5cm depth; 5.718cm^2 area and 2.859cm^3 volume. There is Fat Layer (Subcutaneous Tissue) exposed. There is no tunneling or undermining noted. There is a medium amount of serosanguineous drainage noted. The wound margin is distinct with the outline attached to the wound base. There is large (67-100%) red, hyper - granulation within the wound bed. There is a small (1- 33%) amount of necrotic tissue within the wound bed including Adherent Slough. The periwound skin appearance had no abnormalities noted for texture. The periwound skin appearance had no abnormalities noted for  moisture. The periwound skin appearance did not exhibit: Erythema. Periwound temperature was noted as No Abnormality. The periwound has tenderness on palpation. 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 - Dr. Lady Gary RM 1 Anesthetic: Wound #1 Left,Lateral Lower Leg: (In clinic) Topical Lidocaine 4% applied to wound bed - prior to [employer]  Shower/ Hygiene:  May shower with protection but do not get wound dressing(s) wet. Edema Control - Lymphedema / SCD / Other: Elevate legs to the level of the heart or above for 30 minutes daily and/or when sitting, a frequency of: - throughout the day Avoid standing for long periods of time. Exercise regularly Home Health: Wound #1 Left,Lateral Lower Leg: No change in wound care orders this week; continue Home Health for wound care. May utilize formulary equivalent dressing for wound treatment orders unless otherwise specified. Dressing changes to be completed by Home Health on Monday / Wednesday / Friday except when patient has scheduled visit at [facility]. Other Home Health Orders/Instructions: - [facility] WOUND #1: - Lower Leg Wound Laterality: Left, Lateral Peri-Wound Care: Sween Lotion (Moisturizing lotion) 3 x Per Week/30 Days Discharge Instructions: Apply moisturizing lotion as directed Prim Dressing: KerraCel Ag Gelling Fiber Dressing, 4x5 in (silver alginate) (Generic) 3 x Per Week/30 Days KARIS, EMIG ([id number]) [id number] Page 5 of 5 Discharge Instructions: Apply silver alginate to wound bed as instructed Secondary Dressing: ABD Pad, 5x9 (Generic) 3 x Per Week/30 Days Discharge Instructions: Apply over primary dressing as  directed. Secondary Dressing: Woven Gauze Sponge, Non-Sterile 4x4 in (Generic) 3 x Per Week/30 Days Discharge Instructions: Apply over primary dressing as directed. Compression Wrap: Kerlix Roll 4.5x3.1 (in/yd) (Generic) 3 x Per Week/30 Days Discharge Instructions: Apply Kerlix and Coban compression as directed. Compression Wrap: Coban Self-Adherent Wrap 4x5 (in/yd) (Generic) 3 x Per Week/30 Days Discharge Instructions: Apply over Kerlix as directed. 1. No change the primary dressing which is silver alginate kerlix Coban. He be back next week. Careful attention to measurements Electronic Signature(s) Signed: 07/09/2022 7:19:32 PM By: Baltazar Najjar MD Entered By: Baltazar Najjar on 07/07/2022 08:32:16 -------------------------------------------------------------------------------- SuperBill Details Patient Name: Date of Service: Zachary Clock E. 07/07/2022 Medical Record Number: [id number] Patient Account Number: [id number] Date of Birth/Sex: Treating RN: 03-30-1941 (81 y.o. 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 T81.31XS Disruption of external operation (surgical) wound, not elsewhere classified, sequela I10 Essential (primary) hypertension Facility Procedures : CPT4 Code: [id number] Description: 99213 - WOUND CARE VISIT-LEV 3 EST PT Modifier: Quantity: 1 Physician Procedures : CPT4 Code Description Modifier [id number] - WC PHYS LEVEL 3 - EST PT ICD-10 Diagnosis Description 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

## 2022-07-14 ENCOUNTER — Encounter (HOSPITAL_BASED_OUTPATIENT_CLINIC_OR_DEPARTMENT_OTHER): Payer: Medicare Other | Admitting: Internal Medicine

## 2022-07-14 DIAGNOSIS — Z86718 Personal history of other venous thrombosis and embolism: Secondary | ICD-10-CM | POA: Diagnosis not present

## 2022-07-14 DIAGNOSIS — L97326 Non-pressure chronic ulcer of left ankle with bone involvement without evidence of necrosis: Secondary | ICD-10-CM | POA: Diagnosis not present

## 2022-07-14 DIAGNOSIS — T8131XA Disruption of external operation (surgical) wound, not elsewhere classified, initial encounter: Secondary | ICD-10-CM | POA: Diagnosis not present

## 2022-07-14 DIAGNOSIS — G629 Polyneuropathy, unspecified: Secondary | ICD-10-CM | POA: Diagnosis not present

## 2022-07-14 DIAGNOSIS — G40909 Epilepsy, unspecified, not intractable, without status epilepticus: Secondary | ICD-10-CM | POA: Diagnosis not present

## 2022-07-14 DIAGNOSIS — M1990 Unspecified osteoarthritis, unspecified site: Secondary | ICD-10-CM | POA: Diagnosis not present

## 2022-07-14 DIAGNOSIS — I10 Essential (primary) hypertension: Secondary | ICD-10-CM | POA: Diagnosis not present

## 2022-07-14 NOTE — Progress Notes (Signed)
Sterling, Rosalie E ([id number]) [id number] Page 1 of 7 Visit Report for 07/14/2022 Arrival Information Details Patient Name: Date of Service: [name], Hendry E. 07/14/2022 9:15 A M Medical Record Number: [id number] Patient Account Number: [id number] Date of Birth/Sex: Treating RN: 05/16/1941 (81 y.o. M) Boehlein, Linda Primary Care Gracen Ringwald: Hedrick, James Other Clinician: Referring Deveion Denz: Treating Gentri Guardado/Extender: Robson, Michael Hedrick, James Weeks in Treatment: 4 Visit Information History Since Last Visit Added or deleted any medications: No Patient Arrived: Wheel Chair Any new allergies or adverse reactions: No Arrival Time: 09:30 Had a fall or experienced change in No Accompanied By: spouse activities of daily living that may affect Transfer Assistance: None risk of falls: Patient Identification Verified: Yes Signs or symptoms of abuse/neglect since last visito No Secondary Verification Process Completed: Yes Hospitalized since last visit: No Patient Requires Transmission-Based Precautions: No Implantable device outside of the clinic excluding No Patient Has Alerts: Yes cellular tissue based products placed in the center Patient Alerts: Patient on Blood Thinner since last visit: Has Dressing in Place as Prescribed: Yes Has Compression in Place as Prescribed: Yes Pain Present Now: No Electronic Signature(s) Signed: 07/14/2022 11:14:03 AM By: Boehlein, Linda RN, BSN Entered By: Boehlein, Linda on 07/14/2022 09:31:23 -------------------------------------------------------------------------------- Encounter Discharge Information Details Patient Name: Date of Service: [name], Bransen E. 07/14/2022 9:15 A M Medical Record Number: [id number] Patient Account Number: [id number] Date of Birth/Sex: Treating RN: 08/26/1941 (81 y.o. M) Herrington, Taylor Primary Care Haylen Bellotti: Hedrick, James Other Clinician: Referring Soleia Badolato: Treating  Syreeta Figler/Extender: Robson, Michael Hedrick, James Weeks in Treatment: 4 Encounter Discharge Information Items Discharge Condition: Stable Ambulatory Status: Wheelchair Discharge Destination: Home Transportation: Private Auto Accompanied By: wife Schedule Follow-up Appointment: Yes Clinical Summary of Care: Patient Declined Electronic Signature(s) Signed: 07/14/2022 3:35:50 PM By: Herrington, Taylor Entered By: Herrington, Taylor on 07/14/2022 10:19:18 Dutan, Avin E ([id number]) [id number] Page 2 of 7 -------------------------------------------------------------------------------- Lower Extremity Assessment Details Patient Name: Date of Service: [name], Haim E. 07/14/2022 9:15 A M Medical Record Number: [id number] Patient Account Number: [id number] Date of Birth/Sex: Treating RN: 10/10/1940 (81 y.o. M) Boehlein, Linda Primary Care Shaheen Mende: Hedrick, James Other Clinician: Referring Janaisha Tolsma: Treating Patsye Sullivant/Extender: Robson, Michael Hedrick, James Weeks in Treatment: 4 Edema Assessment Assessed: [Left: No] [Right: No] Edema: [Left: Ye] [Right: s] Calf Left: Right: Point of Measurement: From Medial Instep 28.5 cm Ankle Left: Right: Point of Measurement: From Medial Instep 22.2 cm Vascular Assessment Pulses: Dorsalis Pedis Palpable: [Left:No] Electronic Signature(s) Signed: 07/14/2022 11:14:03 AM By: Boehlein, Linda RN, BSN Entered By: Boehlein, Linda on 07/14/2022 09:43:30 -------------------------------------------------------------------------------- Multi Wound Chart Details Patient Name: Date of Service: [name], Emran E. 07/14/2022 9:15 A M Medical Record Number: [id number] Patient Account Number: [id number] Date of Birth/Sex: Treating RN: 03/20/1941 (81 y.o. M) Primary Care Tiare Rohlman: Hedrick, James Other Clinician: Referring Kyser Wandel: Treating Kathy Wahid/Extender: Robson, Michael Hedrick, James Weeks in Treatment:  4 Vital Signs Height(in): 72 Pulse(bpm): 50 Weight(lbs): 205 Blood Pressure(mmHg): 177/81 Body Mass Index(BMI): 27.8 Temperature(°F): 97.6 Respiratory Rate(breaths/min): 18 [1:Photos:] [N/A:N/A] Left, Lateral Lower Leg N/A N/A Wound Location: Surgical Injury N/A N/A Wounding Event: Open Surgical Wound N/A N/A Primary Etiology: Cataracts, Arrhythmia, Deep Vein N/A N/A Comorbid History: Thrombosis, Hypertension, Osteoarthritis, Neuropathy, Seizure Disorder 05/09/2022 N/A N/A Date Acquired: 4 N/A N/A Weeks of Treatment: Open N/A N/A Wound Status: No N/A N/A Wound Recurrence: Yes N/A N/A Clustered Wound: 3 N/A N/A Clustered Quantity: 5.5x1.2x0.5 N/A N/A Measurements L x W x D (cm) 5.184 N/A N/A   A (cm) : rea 2.592 N/A N/A Volume (cm) : 60.30% N/A N/A % Reduction in Area: 75.20% N/A N/A % Reduction in Volume: Full Thickness With Exposed Support N/A N/A Classification: Structures Medium N/A N/A Exudate Amount: Serosanguineous N/A N/A Exudate Type: red, brown N/A N/A Exudate Color: Distinct, outline attached N/A N/A Wound Margin: Large (67-100%) N/A N/A Granulation Amount: Red, Hyper-granulation N/A N/A Granulation Quality: Small (1-33%) N/A N/A Necrotic Amount: Fat Layer (Subcutaneous Tissue): Yes N/A N/A Exposed Structures: Fascia: No Tendon: No Muscle: No Joint: No Bone: No Small (1-33%) N/A N/A Epithelialization: No Abnormalities Noted N/A N/A Periwound Skin Texture: No Abnormalities Noted N/A N/A Periwound Skin Moisture: Erythema: No N/A N/A Periwound Skin Color: No Abnormality N/A N/A Temperature: Yes N/A N/A Tenderness on Palpation: Treatment Notes Electronic Signature(s) Signed: 07/14/2022 4:47:23 PM By: Linton Ham MD Entered By: Linton Ham on 07/14/2022 10:07:19 -------------------------------------------------------------------------------- Multi-Disciplinary Care Plan Details Patient Name: Date of Service: Harley Alto E. 07/14/2022 9:15 A M Medical Record Number: [id number] Patient Account Number: [id number] Date of Birth/Sex: Treating RN: 1941/05/01 (81 y.o. Ernestene Mention Primary Care Daishaun Ayre: [name] Other Clinician: Referring Tenille Morrill: Treating Aren Cherne/Extender: Imelda Pillow in Treatment: [place] reviewed with physician Active Inactive Venous Leg Ulcer Nursing Diagnoses: Potential for venous Insuffiency (use before diagnosis confirmed) Goals: Patient will maintain optimal edema control Date Initiated: 06/29/2022 Target Resolution Date: 08/11/2022 Goal Status: Active RAINEY, KAHRS ([id number]) [id number] Page 4 of 7 Interventions: Assess peripheral edema status every visit. Compression as ordered Treatment Activities: T ordered outside of clinic : 06/29/2022 est Therapeutic compression applied : 06/29/2022 Notes: Wound/Skin Impairment Nursing Diagnoses: Impaired tissue integrity Knowledge deficit related to ulceration/compromised skin integrity Goals: Patient/caregiver will verbalize understanding of skin care regimen Date Initiated: 06/14/2022 Target Resolution Date: 08/11/2022 Goal Status: Active Ulcer/skin breakdown will have a volume reduction of 30% by week 4 Date Initiated: 06/14/2022 Date Inactivated: 07/14/2022 Target Resolution Date: 07/12/2022 Goal Status: Met Ulcer/skin breakdown will have a volume reduction of 50% by week 8 Date Initiated: 07/14/2022 Target Resolution Date: 08/11/2022 Goal Status: Active Interventions: Assess patient/caregiver ability to obtain necessary supplies Assess patient/caregiver ability to perform ulcer/skin care regimen upon admission and as needed Assess ulceration(s) every visit Provide education on ulcer and skin care Treatment Activities: Skin care regimen initiated : 06/14/2022 Topical wound management initiated :  06/14/2022 Notes: Electronic Signature(s) Signed: 07/14/2022 11:14:03 AM By: Baruch Gouty RN, BSN Entered By: Baruch Gouty on 07/14/2022 09:33:24 -------------------------------------------------------------------------------- Pain Assessment Details Patient Name: Date of Service: Harley Alto E. 07/14/2022 9:15 A M Medical Record Number: [id number] Patient Account Number: [id number] Date of Birth/Sex: Treating RN: 08/06/1941 (81 y.o. Ernestene Mention Primary Care Beckem Tomberlin: [name] Other Clinician: Referring Samiyah Stupka: Treating Zea Kostka/Extender: Imelda Pillow in Treatment: 4 Active Problems Location of Pain Severity and Description of Pain Patient Has Paino No Site Locations Rate the pain. DAMARION, MENDIZABAL ([id number]) [id number] Page 5 of 7 Rate the pain. Current Pain Level: 0 Pain Management and Medication Current Pain Management: Electronic Signature(s) Signed: 07/14/2022 11:14:03 AM By: Baruch Gouty RN, BSN Entered By: Baruch Gouty on 07/14/2022 09:32:38 -------------------------------------------------------------------------------- Patient/Caregiver Education Details Patient Name: Date of Service: Margarette [place] 10/20/2023andnbsp9:15 A M Medical Record Number: [id number] Patient Account Number: [id number] Date of Birth/Gender: Treating RN: May 18, 1941 (81 y.o. Ernestene Mention Primary Care Physician: [name] Other Clinician: Referring Physician: Treating Physician/Extender: Imelda Pillow in  Treatment: 4 Education Assessment Education Provided To: Patient Education Topics Provided Venous: Methods: Explain/Verbal Responses: Reinforcements needed, State content correctly Wound/Skin Impairment: Methods: Explain/Verbal Responses: Reinforcements needed, State content correctly Electronic Signature(s) Signed: 07/14/2022 11:14:03 AM By: Baruch Gouty  RN, BSN Entered By: Baruch Gouty on 07/14/2022 09:33:53 -------------------------------------------------------------------------------- Wound Assessment Details Patient Name: Date of Service: Harley Alto E. 07/14/2022 9:15 A Cathleen Corti ([id number]) [id number] Page 6 of 7 Medical Record Number: [id number] Patient Account Number: [id number] Date of Birth/Sex: Treating RN: March 20, 1941 (81 y.o. Ernestene Mention Primary Care Mehdi Gironda: [name] Other Clinician: Referring Antawn Sison: Treating Reshunda Strider/Extender: Imelda Pillow in Treatment: 4 Wound Status Wound Number: 1 Primary Open Surgical Wound Etiology: Wound Location: Left, Lateral Lower Leg Wound Open Wounding Event: Surgical Injury Status: Date Acquired: 05/09/2022 Comorbid Cataracts, Arrhythmia, Deep Vein Thrombosis, Hypertension, Weeks Of Treatment: 4 History: Osteoarthritis, Neuropathy, Seizure Disorder Clustered Wound: Yes Photos Wound Measurements Length: (cm) Width: (cm) Depth: (cm) Clustered Quantity: Area: (cm) Volume: (cm) 5.5 % Reduction in Area: 60.3% 1.2 % Reduction in Volume: 75.2% 0.5 Epithelialization: Small (1-33%) 3 Tunneling: No 5.184 Undermining: No 2.592 Wound Description Classification: Full Thickness With Exposed Suppo Wound Margin: Distinct, outline attached Exudate Amount: Medium Exudate Type: Serosanguineous Exudate Color: red, brown rt Structures Foul Odor After Cleansing: No Slough/Fibrino Yes Wound Bed Granulation Amount: Large (67-100%) Exposed Structure Granulation Quality: Red, Hyper-granulation Fascia Exposed: No Necrotic Amount: Small (1-33%) Fat Layer (Subcutaneous Tissue) Exposed: Yes Necrotic Quality: Adherent Slough Tendon Exposed: No Muscle Exposed: No Joint Exposed: No Bone Exposed: No Periwound Skin Texture Texture Color No Abnormalities Noted: Yes No Abnormalities Noted: Yes Moisture  Temperature / Pain No Abnormalities Noted: Yes Temperature: No Abnormality Tenderness on Palpation: Yes Treatment Notes Wound #1 (Lower Leg) Wound Laterality: Left, Lateral Cleanser Peri-Wound Care Sween Lotion (Moisturizing lotion) Discharge Instruction: Apply moisturizing lotion as directed Topical Primary Dressing KerraCel Ag Gelling Fiber Dressing, 4x5 in (silver alginate) Discharge Instruction: Apply silver alginate to wound bed as instructed DMAURI, ROSENOW ([id number]) [id number] Page 7 of 7 Secondary Dressing ABD Pad, 5x9 Discharge Instruction: Apply over primary dressing as directed. Woven Gauze Sponge, Non-Sterile 4x4 in Discharge Instruction: Apply over primary dressing as directed. Secured With Compression Wrap Kerlix Roll 4.5x3.1 (in/yd) Discharge Instruction: Apply Kerlix and Coban compression as directed. Coban Self-Adherent Wrap 4x5 (in/yd) Discharge Instruction: Apply over Kerlix as directed. Compression Stockings Add-Ons Electronic Signature(s) Signed: 07/14/2022 11:14:03 AM By: Baruch Gouty RN, BSN Signed: 07/14/2022 11:34:44 AM By: Worthy Rancher Entered By: Worthy Rancher on 07/14/2022 09:42:37 -------------------------------------------------------------------------------- Vitals Details Patient Name: Date of Service: Harley Alto E. 07/14/2022 9:15 A M Medical Record Number: [id number] Patient Account Number: [id number] Date of Birth/Sex: Treating RN: 04/15/1941 (81 y.o. Ernestene Mention Primary Care Marieclaire Bettenhausen: [name] Other Clinician: Referring Izea Livolsi: Treating Ariany Kesselman/Extender: Imelda Pillow in Treatment: 4 Vital Signs Time Taken: 09:31 Temperature (F): 97.6 Height (in): 72 Pulse (bpm): 50 Weight (lbs): 205 Respiratory Rate (breaths/min): 18 Body Mass Index (BMI): 27.8 Blood Pressure (mmHg): 177/81 Reference Range: 80 - 120 mg / dl Electronic Signature(s) Signed:  07/14/2022 11:14:03 AM By: Baruch Gouty RN, BSN Entered By: Baruch Gouty on 07/14/2022 09:37:20

## 2022-07-14 NOTE — Progress Notes (Signed)
NEPHI, SAVAGE ([id number]) [id number] Page 1 of 5 Visit Report for 07/14/2022 HPI Details Patient Name: Date of Service: Zachary, Neal 07/14/2022 9:15 A M Medical Record Number: [id number] Patient Account Number: [id number] Date of Birth/Sex: Treating RN: 06/12/1941 (81 y.o. M) Primary Care Provider: Jerl Neal Other Clinician: Referring Provider: Treating Provider/Extender: [name] in Treatment: 4 History of Present Illness HPI Description: ADMISSION 06/14/2022 This is an 25 suffered a left bimalleolar ankle fracture in November 2022. He underwent ORIF of the fracture. His postoperative course was complicated by areas of wound dehiscence. On May 09, 2022, he was taken to the operating room by orthopedics again for hardware removal. Cultures taken from the operating room demonstrated methicillin sensitive Staph aureus. He is currently taking doxycycline. He is not diabetic, nor does he smoke. ABI in clinic today was 0.86; he is going to have formal ABIs performed this Friday at the vascular surgery clinic in [place]. There is a linear wound running down his lateral left ankle. There are interspersed bands of epithelium. The wound does probe to bone over the malleolus. There is some slough accumulation but also some good granulation tissue filling in the wound bed. There is a single nylon suture remaining in place. 06/21/2022: The wound measured a little bit smaller today. There is good granulation tissue filling in from the depths of the wound. There is a small area where bone is still accessible with a probe. Minimal slough and biofilm accumulation. He has completed his course of oral antibiotics. He did have formal ABIs performed on Friday and they were normal bilaterally. 06/29/2022: The wound continues to contract. The Neal proximal and distal extensions of the incision have nearly closed. The main open portion  of the wound continues to fill with granulation tissue. I am still able to probe to bone in 1 small area, but overall there has been good improvement. 10/13; the wound continues to contract. Healthy granulation. The deepest parts of the distal wound are actually in the mid aspect. Smaller areas superiorly look superficial. 10/20; minimally smaller. Using silver alginate. Electronic Signature(s) Signed: 07/14/2022 4:47:23 PM By: Zachary Najjar MD Entered By: [name] on 07/14/2022 10:07:48 -------------------------------------------------------------------------------- Physical Exam Details Patient Name: Date of Service: Zachary Clock E. 07/14/2022 9:15 A M Medical Record Number: [id number] Patient Account Number: [id number] Date of Birth/Sex: Treating RN: April 08, 1941 (81 y.o. M) Primary Care Provider: Jerl Neal Other Clinician: Referring Provider: Treating Provider/Extender: [name] in Treatment: 4 Constitutional Patient is hypertensive.. Pulse regular and within target range for patient.Marland Kitchen Respirations regular, non-labored and within target range.. Temperature is normal and within the target range for the patient.Marland Kitchen Appears in no distress. Notes Wound exam; pedal pulses are palpable. 2 areas on the left ankle. The larger wound is inferiorly both of these appear to have very healthy looking granulation. No debridement was required. No evidence of surrounding infection Electronic Signature(s) Signed: 07/14/2022 4:47:23 PM By: Zachary Najjar MD Entered By: [name] on 07/14/2022 10:08:43 [name] ([id number]) [id number] Page 2 of 5 -------------------------------------------------------------------------------- Physician Orders Details Patient Name: Date of Service: Zachary, Neal 07/14/2022 9:15 A M Medical Record Number: [id number] Patient Account Number: [id number] Date of Birth/Sex:  Treating RN: 1940/09/28 (81 y.o. 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 - Dr. Celine Neal RM 1 Return A Anesthetic Wound #1 Left,Lateral Lower Leg (In clinic) Topical Lidocaine 4% applied to wound bed - prior to [employer] May shower with protection but do not get wound dressing(s) wet. Edema Control - Lymphedema / SCD / Other Elevate legs to the level of the heart or above for 30 minutes daily and/or when sitting, a frequency of: - throughout the day Avoid standing for long periods of time. Exercise regularly Home Health Wound #1 Left,Lateral Lower Leg No change in wound care orders this week; continue Home Health for wound care. May utilize formulary equivalent dressing for wound treatment orders unless otherwise specified. Dressing changes to be completed by [place] on Monday / Wednesday / Friday except when patient has scheduled visit at [facility]. Other Home Health Orders/Instructions: - [facility] Wound Treatment Wound #1 - Lower Leg Wound Laterality: Left, Lateral Peri-Wound Care: Sween Lotion (Moisturizing lotion) 3 x Per Week/30 Days Discharge Instructions: Apply moisturizing lotion as directed Prim Dressing: KerraCel Ag Gelling Fiber Dressing, 4x5 in (silver alginate) (Generic) 3 x Per Week/30 Days ary Discharge Instructions: Apply silver alginate to wound bed as instructed Secondary Dressing: ABD Pad, 5x9 (Generic) 3 x Per Week/30 Days Discharge  Instructions: Apply over primary dressing as directed. Secondary Dressing: Woven Gauze Sponge, Non-Sterile 4x4 in (Generic) 3 x Per Week/30 Days Discharge Instructions: Apply over primary dressing as directed. Compression Wrap: Kerlix Roll 4.5x3.1 (in/yd) (Generic) 3 x Per Week/30 Days Discharge Instructions: Apply Kerlix and Coban compression as directed. Compression Wrap: Coban Self-Adherent Wrap 4x5 (in/yd) (Generic) 3 x Per Week/30 Days Discharge Instructions: Apply over Kerlix as directed. Patient Medications llergies: codeine, fentanyl, regadenoson, verapamil, acetaminophen, Sulfa (Sulfonamide Antibiotics) A Notifications Medication Indication Start End 07/14/2022 lidocaine DEMARLO, RIOJAS ([id number]) [id number] Page 3 of 5 DOSE topical 4 % cream - cream topical Electronic Signature(s) Signed: 07/14/2022 3:35:50 PM By: Adline Peals Signed: 07/14/2022 4:47:23 PM By: Linton Ham MD Entered By: Adline Peals on 07/14/2022 09:55:12 -------------------------------------------------------------------------------- Problem List Details Patient Name: Date of Service: Zachary Alto E. 07/14/2022 9:15 A M Medical Record Number: [id number] Patient Account Number: [id number] Date of Birth/Sex: Treating RN: 1941/07/23 (81 y.o. 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 07/14/2022 9:15 A M Medical Record Number: [id number] Patient Account Number: [id number] Date of Birth/Sex: Treating RN: Nov 10, 1940 (82 y.o. M) Primary Care Provider: [name] Other Clinician: Referring Provider: Treating Provider/Extender: Imelda Pillow in Treatment: 4 Subjective History of Present Illness (HPI) MAXIMILLIAN, HABIBI ([id number]) [id number] Page 4 of 5 ADMISSION 06/14/2022 This is an 87 suffered a left bimalleolar ankle fracture in November 2022. He underwent ORIF of the fracture. His postoperative course  was complicated by areas of wound dehiscence. On May 09, 2022, he was taken to the operating room by orthopedics again for hardware removal. Cultures taken from the operating room demonstrated methicillin sensitive Staph aureus. He is currently taking doxycycline. He is not diabetic, nor does he smoke. ABI in clinic today was 0.86; he is going to have formal ABIs performed this Friday at the vascular surgery clinic in [place]. There is a linear wound running down his lateral left ankle. There are interspersed bands of epithelium. The wound does probe to bone over the malleolus. There is some slough accumulation but also some good granulation tissue filling in the wound bed. There is a single nylon suture remaining in place. 06/21/2022: The wound measured a little bit smaller today. There is good granulation tissue filling in from the depths of the wound. There is a small area where bone is still accessible with a probe. Minimal slough and biofilm accumulation. He has completed his course of oral antibiotics. He did have formal ABIs performed on Friday and they were normal  bilaterally. 06/29/2022: The wound continues to contract. The Neal proximal and distal extensions of the incision have nearly closed. The main open portion of the wound continues to fill with granulation tissue. I am still able to probe to bone in 1 small area, but overall there has been good improvement. 10/13; the wound continues to contract. Healthy granulation. The deepest parts of the distal wound are actually in the mid aspect. Smaller areas superiorly look superficial. 10/20; minimally smaller. Using silver alginate. Objective Constitutional Patient is hypertensive.. Pulse regular and within target range for patient.Marland Kitchen Respirations regular, non-labored and within target range.. Temperature is normal and within the target range for the patient.Marland Kitchen Appears in no distress. Vitals Time Taken: 9:31 AM, Height: 72 in, Weight: 205 lbs, BMI: 27.8, Temperature: 97.6 F, Pulse: 50 bpm, Respiratory Rate: 18 breaths/min, Blood Pressure: 177/81 mmHg. General Notes: Wound exam; pedal pulses are palpable. 2 areas on the left ankle. The larger wound is inferiorly both of these appear to have very healthy looking granulation. No debridement was required. No evidence of surrounding infection Integumentary (Hair, Skin) Wound #1 status is Open. Original cause of wound was Surgical Injury. The date acquired was: 05/09/2022. The wound has been in treatment 4 weeks. The wound is located on the Left,Lateral Lower Leg. The wound measures 5.5cm length x 1.2cm width x 0.5cm depth; 5.184cm^2 area and 2.592cm^3 volume. There is Fat Layer (Subcutaneous Tissue) exposed. There is no tunneling or undermining noted. There is a medium amount of serosanguineous drainage noted. The wound margin is distinct with the outline attached to the wound base. There is large (67-100%) red, hyper - granulation within the wound bed. There is a small (1- 33%) amount of necrotic tissue within the wound bed including Adherent Slough. The  periwound skin appearance had no abnormalities noted for texture. The periwound skin appearance had no abnormalities noted for moisture. The periwound skin appearance had no abnormalities noted for color. Periwound temperature was noted as No Abnormality. The periwound has tenderness on palpation. 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 - Dr. Lady Gary RM 1 Anesthetic: Wound #1 Left,Lateral Lower Leg: (In clinic) Topical Lidocaine 4% applied to wound bed - prior to [employer] Bathing/ Shower/ Hygiene: May shower with protection but do not  get wound dressing(s) wet. Edema Control - Lymphedema / SCD / Other: Elevate legs to the level of the heart or above for 30 minutes daily and/or when sitting, a frequency of: - throughout the day Avoid standing for long periods of time. Exercise regularly Home Health: Wound #1 Left,Lateral Lower Leg: No change in wound care orders this week; continue Home Health for wound care. May utilize formulary equivalent dressing for wound treatment orders unless otherwise specified. Dressing changes to be completed by Home Health on Monday / Wednesday / Friday except when patient has scheduled visit [facility]. Zachary, Neal ([id number]) [id number] Page 5 of 5 Other Home Health Orders/Instructions: - [facility] The following medication(s) was prescribed: lidocaine topical 4 % cream cream topical was prescribed at facility WOUND #1: - Lower Leg Wound Laterality: Left, Lateral Peri-Wound Care: Sween Lotion (Moisturizing lotion) 3 x Per Week/30 Days Discharge Instructions: Apply moisturizing lotion as directed Prim Dressing: KerraCel Ag Gelling Fiber Dressing, 4x5  in (silver alginate) (Generic) 3 x Per Week/30 Days ary Discharge Instructions: Apply silver alginate to wound bed as instructed Secondary Dressing: ABD Pad, 5x9 (Generic) 3 x Per Week/30 Days Discharge Instructions: Apply over primary dressing as directed. Secondary Dressing: Woven Gauze Sponge, Non-Sterile 4x4 in (Generic) 3 x Per Week/30 Days Discharge Instructions: Apply over primary dressing as directed. Com pression Wrap: Kerlix Roll 4.5x3.1 (in/yd) (Generic) 3 x Per Week/30 Days Discharge Instructions: Apply Kerlix and Coban compression as directed. Com pression Wrap: Coban Self-Adherent Wrap 4x5 (in/yd) (Generic) 3 x Per Week/30 Days Discharge Instructions: Apply over Kerlix as directed. 1. I continue with the silver alginate for another week if the wound is stalled another alternative might be in order perhaps a collagen or Hydrofera Blue #2 no evidence of current infection. No evidence of PAD Electronic Signature(s) Signed: 07/14/2022 4:47:23 PM By: Zachary Najjar MD Entered By: [name] on 07/14/2022 10:10:34 -------------------------------------------------------------------------------- SuperBill Details Patient Name: Date of Service: Zachary Clock E. 07/14/2022 Medical Record Number: [id number] Patient Account Number: [id number] Date of Birth/Sex: Treating RN: 03-21-1941 (81 y.o. 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

## 2022-07-19 DIAGNOSIS — M1990 Unspecified osteoarthritis, unspecified site: Secondary | ICD-10-CM | POA: Diagnosis not present

## 2022-07-19 DIAGNOSIS — G629 Polyneuropathy, unspecified: Secondary | ICD-10-CM | POA: Diagnosis not present

## 2022-07-19 DIAGNOSIS — E785 Hyperlipidemia, unspecified: Secondary | ICD-10-CM | POA: Diagnosis not present

## 2022-07-19 DIAGNOSIS — I4891 Unspecified atrial fibrillation: Secondary | ICD-10-CM | POA: Diagnosis not present

## 2022-07-19 DIAGNOSIS — T8149XD Infection following a procedure, other surgical site, subsequent encounter: Secondary | ICD-10-CM | POA: Diagnosis not present

## 2022-07-19 DIAGNOSIS — Z8719 Personal history of other diseases of the digestive system: Secondary | ICD-10-CM | POA: Diagnosis not present

## 2022-07-19 DIAGNOSIS — I10 Essential (primary) hypertension: Secondary | ICD-10-CM | POA: Diagnosis not present

## 2022-07-19 DIAGNOSIS — Z9841 Cataract extraction status, right eye: Secondary | ICD-10-CM | POA: Diagnosis not present

## 2022-07-19 DIAGNOSIS — Z9181 History of falling: Secondary | ICD-10-CM | POA: Diagnosis not present

## 2022-07-19 DIAGNOSIS — T8132XD Disruption of internal operation (surgical) wound, not elsewhere classified, subsequent encounter: Secondary | ICD-10-CM | POA: Diagnosis not present

## 2022-07-19 DIAGNOSIS — Z7901 Long term (current) use of anticoagulants: Secondary | ICD-10-CM | POA: Diagnosis not present

## 2022-07-19 DIAGNOSIS — Z8781 Personal history of (healed) traumatic fracture: Secondary | ICD-10-CM | POA: Diagnosis not present

## 2022-07-19 DIAGNOSIS — G40909 Epilepsy, unspecified, not intractable, without status epilepticus: Secondary | ICD-10-CM | POA: Diagnosis not present

## 2022-07-19 DIAGNOSIS — H547 Unspecified visual loss: Secondary | ICD-10-CM | POA: Diagnosis not present

## 2022-07-19 DIAGNOSIS — B9561 Methicillin susceptible Staphylococcus aureus infection as the cause of diseases classified elsewhere: Secondary | ICD-10-CM | POA: Diagnosis not present

## 2022-07-19 DIAGNOSIS — Z86718 Personal history of other venous thrombosis and embolism: Secondary | ICD-10-CM | POA: Diagnosis not present

## 2022-07-21 ENCOUNTER — Encounter (HOSPITAL_BASED_OUTPATIENT_CLINIC_OR_DEPARTMENT_OTHER): Payer: Medicare Other | Admitting: General Surgery

## 2022-07-21 DIAGNOSIS — G40909 Epilepsy, unspecified, not intractable, without status epilepticus: Secondary | ICD-10-CM | POA: Diagnosis not present

## 2022-07-21 DIAGNOSIS — I10 Essential (primary) hypertension: Secondary | ICD-10-CM | POA: Diagnosis not present

## 2022-07-21 DIAGNOSIS — M1990 Unspecified osteoarthritis, unspecified site: Secondary | ICD-10-CM | POA: Diagnosis not present

## 2022-07-21 DIAGNOSIS — Z86718 Personal history of other venous thrombosis and embolism: Secondary | ICD-10-CM | POA: Diagnosis not present

## 2022-07-21 DIAGNOSIS — G629 Polyneuropathy, unspecified: Secondary | ICD-10-CM | POA: Diagnosis not present

## 2022-07-21 DIAGNOSIS — L97326 Non-pressure chronic ulcer of left ankle with bone involvement without evidence of necrosis: Secondary | ICD-10-CM | POA: Diagnosis not present

## 2022-07-21 NOTE — Progress Notes (Signed)
DEANTHONY, DIZE ([id number]) [id number] Page 1 of 9 Visit Report for 07/21/2022 Chief Complaint Document Details Patient Name: Date of Service: [name], [name] 07/21/2022 10:00 A M Medical Record Number: [id number] Patient Account Number: [id number] Date of Birth/Sex: Treating RN: 09-Sep-1941 (81 y.o. M) Primary Care Provider: [name] Other Clinician: Referring Provider: Treating Provider/Extender: Lenox Ponds in Treatment: 5 Information Obtained from: Patient Chief Complaint Patient presents to the wound care center with open non-healing surgical wound(s) Electronic Signature(s) Signed: 07/21/2022 10:09:17 AM By: Fredirick Maudlin MD FACS Entered By: Fredirick Maudlin on 07/21/2022 10:09:17 -------------------------------------------------------------------------------- Debridement Details Patient Name: Date of Service: Zachary Alto Neal. 07/21/2022 10:00 [place] Record Number: [id number] Patient Account Number: [id number] Date of Birth/Sex: Treating RN: 1941/08/09 (81 y.o. Janyth Contes Primary Care Provider: [name] Other Clinician: Referring Provider: Treating Provider/Extender: Lenox Ponds in Treatment: 5 Debridement Performed for Assessment: Wound #1 Left,Lateral Lower Leg Performed By: Physician Fredirick Maudlin, MD Debridement Type: Debridement Level of Consciousness (Pre-procedure): Awake and Alert Pre-procedure Verification/Time Out Yes - 10:03 Taken: Start Time: 10:03 Pain Control: Lidocaine 4% T opical Solution T Area Debrided (L x W): otal 3 (cm) x 1.1 (cm) = 3.3 (cm) Tissue and other material debrided: Non-Viable, Slough, Slough Level: Non-Viable Tissue Debridement Description: Selective/Open Wound Instrument: Curette Bleeding: Minimum Hemostasis Achieved: Pressure Response to Treatment: Procedure was tolerated well Level of Consciousness  (Post- Awake and Alert procedure): Post Debridement Measurements of Total Wound Length: (cm) 5.5 Width: (cm) 1.1 Depth: (cm) 0.1 Volume: (cm) 0.475 Character of Wound/Ulcer Post Debridement: Improved Post Procedure Diagnosis Same as [name], [name] ([id number]) [id number] Page 2 of 9 Notes scribed for Dr. Celine Ahr by Adline Peals, RN Electronic Signature(s) Signed: 07/21/2022 10:13:51 AM By: Fredirick Maudlin MD FACS Signed: 07/21/2022 4:25:07 PM By: Sabas Sous By: Adline Peals on 07/21/2022 10:06:47 -------------------------------------------------------------------------------- HPI Details Patient Name: Date of Service: Zachary Alto Neal. 07/21/2022 10:00 A M Medical Record Number: [id number] Patient Account Number: [id number] Date of Birth/Sex: Treating RN: Dec 12, 1940 (81 y.o. M) Primary Care Provider: [name] Other Clinician: Referring Provider: Treating Provider/Extender: Lenox Ponds in Treatment: 5 History of Present Illness HPI Description: ADMISSION 06/14/2022 This is an 75 suffered a left bimalleolar ankle fracture in November 2022. He underwent ORIF of the fracture. His postoperative course was complicated by areas of wound dehiscence. On May 09, 2022, he was taken to the operating room by orthopedics again for hardware removal. Cultures taken from the operating room demonstrated methicillin sensitive Staph aureus. He is currently taking doxycycline. He is not diabetic, nor does he smoke. ABI in clinic today was 0.86; he is going to have formal ABIs performed this Friday at the vascular surgery clinic in [place]. There is a linear wound running down his lateral left ankle. There are interspersed bands of epithelium. The wound does probe to bone over the malleolus. There is some slough accumulation but also some good granulation tissue filling in the wound bed.  There is a single nylon suture remaining in place. 06/21/2022: The wound measured a little bit smaller today. There is good granulation tissue filling in from the depths of the wound. There is a small area where bone is still accessible with a probe. Minimal slough and biofilm accumulation. He has completed his course of oral antibiotics. He did have formal ABIs performed on Friday and they were normal bilaterally. 06/29/2022: The wound continues  to contract. The more proximal and distal extensions of the incision have nearly closed. The main open portion of the wound continues to fill with granulation tissue. I am still able to probe to bone in 1 small area, but overall there has been good improvement. 10/13; the wound continues to contract. Healthy granulation. The deepest parts of the distal wound are actually in the mid aspect. Smaller areas superiorly look superficial. 10/20; minimally smaller. Using silver alginate. 07/21/2022: The wound continues to contract. The bone is completely covered. Good granulation tissue. Electronic Signature(s) Signed: 07/21/2022 10:10:27 AM By: Fredirick Maudlin MD FACS Entered By: Fredirick Maudlin on 07/21/2022 10:10:27 -------------------------------------------------------------------------------- Physical Exam Details Patient Name: Date of Service: Zachary Alto Neal. 07/21/2022 10:00 A M Medical Record Number: [id number] Patient Account Number: [id number] Date of Birth/Sex: Treating RN: 1941/01/30 (81 y.o. M) Primary Care Provider: [name] Other Clinician: Referring Provider: Treating Provider/Extender: Lenox Ponds in Treatment: 5 Constitutional Hypertensive, asymptomatic. . . . No acute distress.Marland Kitchen Respiratory Normal work of breathing on room air.[name], [name] ([id number]) [id number] Page 3 of 9 Notes 07/21/2022: The wound continues to contract. The bone is completely covered.  Good granulation tissue. Electronic Signature(s) Signed: 07/21/2022 10:10:59 AM By: Fredirick Maudlin MD FACS Entered By: Fredirick Maudlin on 07/21/2022 10:10:59 -------------------------------------------------------------------------------- Physician Orders Details Patient Name: Date of Service: Zachary Alto Neal. 07/21/2022 10:00 A M Medical Record Number: [id number] Patient Account Number: [id number] Date of Birth/Sex: Treating RN: 1940/10/22 (81 y.o. 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 - Dr. Celine Ahr RM 1 Return A Anesthetic Wound #1 Left,Lateral Lower Leg (In clinic) Topical Lidocaine 4% applied to wound bed - prior to [employer] May shower with protection but do not get wound dressing(s) wet. Edema Control - Lymphedema / SCD / Other Elevate legs to the level of the heart or above for 30 minutes daily and/or when sitting, a frequency of: - throughout the day Avoid standing for long periods of time. Exercise regularly Home Health Wound #1 Left,Lateral Lower Leg No change in wound care orders this week; continue Home Health for wound care. May utilize formulary equivalent dressing for wound treatment orders unless otherwise specified. Dressing changes to be completed by [place] on Monday / Wednesday / Friday except when patient has scheduled visit at [facility]. Other Home Health Orders/Instructions: - [facility] Wound Treatment Wound #1 - Lower Leg  Wound Laterality: Left, Lateral Peri-Wound Care: Sween Lotion (Moisturizing lotion) 3 x Per Week/30 Days Discharge Instructions: Apply moisturizing lotion as directed Prim Dressing: KerraCel Ag Gelling Fiber Dressing, 4x5 in (silver alginate) (Generic) 3 x Per Week/30 Days ary Discharge Instructions: Apply silver alginate to wound bed as instructed Secondary Dressing: ABD Pad, 5x9 (Generic) 3 x Per Week/30 Days Discharge Instructions: Apply over primary dressing as directed. Secondary Dressing: Optifoam Non-Adhesive Dressing, 4x4 in 3 x Per Week/30 Days Discharge Instructions: Apply to medial ankle bone to protect from irritation. Secondary Dressing: Woven Gauze Sponge, Non-Sterile 4x4 in (Generic) 3 x Per Week/30 Days Discharge Instructions: Apply over primary dressing as directed. [name], [name] ([id number]) [id number] Page  4 of 9 Compression Wrap: Kerlix Roll 4.5x3.1 (in/yd) (Generic) 3 x Per Week/30 Days Discharge Instructions: Apply Kerlix and Coban compression as directed. Compression Wrap: Coban Self-Adherent Wrap 4x5 (in/yd) (Generic) 3 x Per Week/30 Days Discharge Instructions: Apply over Kerlix as directed. Patient Medications llergies: codeine, fentanyl, regadenoson, verapamil, acetaminophen, Sulfa (Sulfonamide Antibiotics) A Notifications Medication Indication Start End 07/21/2022 lidocaine DOSE topical 4 % cream - cream topical Electronic Signature(s) Signed: 07/21/2022 10:13:51 AM By: Fredirick Maudlin MD FACS Entered By: Fredirick Maudlin on 07/21/2022 10:13:01 -------------------------------------------------------------------------------- Problem List Details Patient Name: Date of Service: Zachary Alto Neal. 07/21/2022 10:00 A M Medical Record Number: [id number] Patient Account Number: [id number] Date of Birth/Sex: Treating RN: August 30, 1941 (81 y.o. 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 without 06/14/2022 No Yes evidence of necrosis T81.31XS Disruption of external operation (surgical) wound, not elsewhere classified, 06/14/2022 No Yes sequela I10 Essential (primary) hypertension 06/14/2022 No Yes Inactive Problems Resolved Problems Electronic Signature(s) Signed: 07/21/2022 10:08:59 AM By: Fredirick Maudlin MD FACS Entered By: Fredirick Maudlin on 07/21/2022 10:08:59 [name] ([id number]) [id number] Page 5 of 9 -------------------------------------------------------------------------------- Progress Note Details Patient Name: Date of Service: [name], [name] 07/21/2022 10:00 A M Medical Record Number: [id number] Patient Account Number: [id number] Date of Birth/Sex: Treating RN: 12-23-1940 (81 y.o. M) Primary Care Provider: [name] Other Clinician: Referring Provider: Treating Provider/Extender: Lenox Ponds in Treatment: 5 Subjective Chief Complaint Information obtained from Patient Patient presents to the wound care center with open non-healing surgical wound(s) History of Present Illness (HPI) ADMISSION 06/14/2022 This is an 36 suffered a left bimalleolar ankle fracture in November 2022. He underwent ORIF of the fracture. His postoperative course was complicated by areas of wound dehiscence. On May 09, 2022, he was taken to the operating room by orthopedics again for hardware removal. Cultures taken from the operating room demonstrated methicillin sensitive Staph aureus. He is currently taking doxycycline. He is not diabetic, nor does he smoke. ABI in clinic today was 0.86; he is going to have formal ABIs performed this Friday at the vascular surgery clinic in  [place]. There is a linear wound running down his lateral left ankle. There are interspersed bands of epithelium. The wound does probe to bone over the malleolus. There is some slough accumulation but also some good granulation tissue filling in the wound bed. There is a single nylon suture remaining in place. 06/21/2022: The wound measured a little bit smaller today. There is good granulation tissue filling in from the depths of the wound. There is a small area where bone is still accessible with a probe. Minimal slough and biofilm accumulation. He has completed his course of oral antibiotics. He did have formal ABIs performed on Friday and they were normal bilaterally. 06/29/2022: The wound continues to contract. The more proximal and distal extensions of the incision have nearly closed. The main open portion of the wound continues to fill with granulation tissue. I am still able to probe to bone in 1 small area, but overall there has been good improvement. 10/13; the wound continues to contract. Healthy granulation. The deepest parts of the distal wound are actually in the mid aspect. Smaller areas superiorly look superficial. 10/20; minimally smaller. Using silver alginate. 07/21/2022: The wound continues to contract. The  bone is completely covered. Good granulation tissue. Patient History Information obtained from Patient, Chart. Family History Heart Disease - Mother, Hypertension - Father, No family history of Cancer, Diabetes, Hereditary Spherocytosis, Kidney Disease, Lung Disease, Seizures, Stroke, Thyroid Problems, Tuberculosis. Social History Never smoker, Marital Status - Married, Alcohol Use - Never, Drug Use - No History, Caffeine Use - Daily - coffee. Medical History Eyes Patient has history of Cataracts - right eye extraction Denies history of Glaucoma, Optic Neuritis Cardiovascular Patient has history of Arrhythmia - afib, Deep Vein Thrombosis - left leg,  Hypertension Endocrine Denies history of Type I Diabetes, Type II Diabetes Genitourinary Denies history of End Stage Renal Disease Integumentary (Skin) Denies history of History of Burn Musculoskeletal Patient has history of Osteoarthritis Denies history of Osteomyelitis Neurologic Patient has history of Neuropathy, Seizure Disorder - hx Oncologic Denies history of Received Chemotherapy, Received Radiation Psychiatric Denies history of Anorexia/bulimia, Confinement Anxiety Medical A Surgical History Notes nd Cardiovascular hyperlipidemia Gastrointestinal hx GI bleed Genitourinary hx pyelonephritis Neurologic hx cerebral hemorrhage [name], [name] ([id number]) [id number] Page 6 of 9 Objective Constitutional Hypertensive, asymptomatic. No acute distress.. Vitals Time Taken: 9:57 AM, Height: 72 in, Weight: 205 lbs, BMI: 27.8, Temperature: 97.5 F, Pulse: 67 bpm, Respiratory Rate: 18 breaths/min, Blood Pressure: 176/92 mmHg. Respiratory Normal work of breathing on room air.. General Notes: 07/21/2022: The wound continues to contract. The bone is completely covered. Good granulation tissue. Integumentary (Hair, Skin) Wound #1 status is Open. Original cause of wound was Surgical Injury. The date acquired was: 05/09/2022. The wound has been in treatment 5 weeks. The wound is located on the Left,Lateral Lower Leg. The wound measures 5.5cm length x 1.1cm width x 0.2cm depth; 4.752cm^2 area and 0.95cm^3 volume. There is Fat Layer (Subcutaneous Tissue) exposed. There is no tunneling or undermining noted. There is a medium amount of serosanguineous drainage noted. The wound margin is distinct with the outline attached to the wound base. There is large (67-100%) red granulation within the wound bed. There is a small (1-33%) amount of necrotic tissue within the wound bed including Adherent Slough. The periwound skin appearance had no abnormalities noted for  texture. The periwound skin appearance had no abnormalities noted for moisture. The periwound skin appearance had no abnormalities noted for color. Periwound temperature was noted as No Abnormality. The periwound has tenderness on palpation. 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 There was a Selective/Open Wound Non-Viable Tissue Debridement with a total area of 3.3 sq cm performed by Fredirick Maudlin, MD. With the following instrument(s): Curette to remove Non-Viable tissue/material. Material removed includes [facility] after achieving pain control using Lidocaine 4% Topical Solution. No specimens were taken. A time out was conducted at 10:03, prior to the start of the procedure. A Minimum amount of bleeding was controlled with Pressure. The procedure was tolerated well. Post Debridement Measurements: 5.5cm length x 1.1cm width x 0.1cm depth; 0.475cm^3 volume. Character of Wound/Ulcer Post Debridement is improved. Post procedure Diagnosis Wound #1: Same as Pre-Procedure General Notes: scribed for Dr. Celine Ahr by Adline Peals, RN. Plan Follow-up Appointments: Return Appointment in 1 week. - Dr. Celine Ahr RM 1 Anesthetic: Wound #1 Left,Lateral Lower Leg: (In clinic) Topical Lidocaine 4% applied to wound bed - prior to [employer]  Hygiene: May shower with protection but do not get wound dressing(s) wet. Edema Control - Lymphedema / SCD / Other: Elevate legs to the level of the heart or above for 30 minutes daily and/or when sitting, a frequency of: - throughout the day Avoid standing for long periods of time. Exercise regularly Home Health: Wound #1  Left,Lateral Lower Leg: No change in wound care orders this week; continue Home Health for wound care. May utilize formulary equivalent dressing for wound treatment orders unless otherwise specified. Dressing changes to be completed by [place] on Monday / Wednesday / Friday except when patient has scheduled visit at [facility]. Other Home Health Orders/Instructions: - [facility] The following medication(s) was prescribed: lidocaine topical 4 % cream cream topical was prescribed at facility WOUND #1: - Lower Leg Wound Laterality: Left, Lateral Peri-Wound Care: Sween Lotion (Moisturizing lotion) 3 x Per Week/30 Days Discharge Instructions: Apply moisturizing lotion as directed Prim Dressing: KerraCel Ag Gelling Fiber Dressing, 4x5 in (silver alginate) (Generic) 3 x Per Week/30 Days [name], [name] ([id number]) [id number] Page 7 of 9 Discharge Instructions: Apply silver alginate to wound bed as instructed Secondary Dressing: ABD Pad, 5x9 (Generic) 3 x Per Week/30 Days Discharge Instructions: Apply over primary dressing as directed. Secondary Dressing: Optifoam Non-Adhesive Dressing, 4x4 in 3 x Per Week/30 Days Discharge Instructions: Apply to medial ankle bone to protect from irritation. Secondary Dressing: Woven Gauze Sponge, Non-Sterile 4x4 in (Generic) 3 x Per Week/30 Days Discharge Instructions: Apply over primary dressing as directed. Compression Wrap: Kerlix Roll 4.5x3.1 (in/yd) (Generic) 3 x Per Week/30 Days Discharge Instructions: Apply Kerlix and Coban compression as directed. Compression Wrap: Coban Self-Adherent Wrap 4x5 (in/yd) (Generic) 3 x Per Week/30 Days Discharge Instructions: Apply over Kerlix as directed. 07/21/2022: The wound continues to contract. The bone is completely covered. Good granulation tissue. I used a curette to debride the slough from the wound. We will continue silver alginate with Kerlix and Coban wrap. Follow-up in  1 week. Electronic Signature(s) Signed: 07/21/2022 10:13:20 AM By: Fredirick Maudlin MD FACS Entered By: Fredirick Maudlin on 07/21/2022 10:13:19 -------------------------------------------------------------------------------- HxROS Details Patient Name: Date of Service: Zachary Alto Neal. 07/21/2022 10:00 A M Medical Record Number: [id number] Patient Account Number: [id number] Date of Birth/Sex: Treating RN: [birth year] (81 y.o. M) Primary Care Provider: [name] Other Clinician: Referring Provider: Treating Provider/Extender: Lenox Ponds in Treatment: 5 Information Obtained From Patient Chart Eyes Medical History: Positive for: Cataracts - right eye extraction Negative for: Glaucoma; Optic Neuritis Cardiovascular Medical History: Positive for: Arrhythmia - afib; Deep Vein Thrombosis - left leg; Hypertension Past Medical History Notes: hyperlipidemia Gastrointestinal Medical History: Past Medical History Notes: hx GI bleed Endocrine Medical History: Negative for: Type I Diabetes; Type II Diabetes Genitourinary Medical History: Negative for: End Stage Renal Disease Past Medical History Notes: hx pyelonephritis Integumentary (Skin) Medical History: Negative for: History of Burn [name], [name] ([id number]) [id number] Page 8 of 9 Musculoskeletal Medical History: Positive for: Osteoarthritis Negative for: Osteomyelitis Neurologic Medical History: Positive for: Neuropathy; Seizure Disorder - hx Past Medical History Notes: hx cerebral hemorrhage Oncologic Medical History: Negative for: Received Chemotherapy; Received Radiation Psychiatric Medical History: Negative for: Anorexia/bulimia; Confinement Anxiety HBO Extended History Items Eyes: Cataracts Immunizations Pneumococcal Vaccine: Received Pneumococcal Vaccination: Yes Received Pneumococcal Vaccination On or After 60th Birthday:  Yes Implantable Devices None Family and Social History Cancer: No; Diabetes: No; Heart Disease: Yes - Mother; Hereditary Spherocytosis: No; Hypertension: Yes - Father; Kidney Disease: No;  Lung Disease: No; Seizures: No; Stroke: No; Thyroid Problems: No; Tuberculosis: No; Never smoker; Marital Status - Married; Alcohol Use: Never; Drug Use: No History; Caffeine Use: Daily - coffee; Financial Concerns: No; Food, Clothing or Shelter Needs: No; Support System Lacking: No; Transportation Concerns: Yes - caregiver provides rides in am Electronic Signature(s) Signed: 07/21/2022 10:13:51 AM By: Fredirick Maudlin MD FACS Entered By: Fredirick Maudlin on 07/21/2022 10:10:35 -------------------------------------------------------------------------------- SuperBill Details Patient Name: Date of Service: Zachary Alto Neal. 07/21/2022 Medical Record Number: [id number] Patient Account Number: [id number] Date of Birth/Sex: Treating RN: 09-Sep-1941 (81 y.o. 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 wit [id number] Page 9 of 9: Modifier Quantity 1 hout evidence of necrosis Physician Procedures : CPT4 Code Description Modifier [id number] - WC PHYS LEVEL 3 - EST PT 25 ICD-10 Diagnosis Description 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

## 2022-07-21 NOTE — Progress Notes (Signed)
DVAUGHN, FICKLE ([id number]) [id number] Page 1 of 8 Visit Report for 07/21/2022 Arrival Information Details Patient Name: Date of Service: Zachary, Neal 07/21/2022 10:00 A M Medical Record Number: [id number] Patient Account Number: [id number] Date of Birth/Sex: Treating RN: April 04, 1941 (81 y.o. [name] Primary Care [name]: [name] Other Clinician: Referring [name]: Treating [name]/Extender: Lenox Ponds in Treatment: 5 Visit Information History Since Last Visit Added or deleted any medications: No Patient Arrived: Wheel Chair Any new allergies or adverse reactions: No Arrival Time: 09:51 Had a fall or experienced change in No Accompanied By: spouse/caregiver activities of daily living that may affect Transfer Assistance: Manual risk of falls: Patient Identification Verified: Yes Signs or symptoms of abuse/neglect since last visito No Patient Requires Transmission-Based Precautions: No Hospitalized since last visit: No Patient Has Alerts: Yes Implantable device outside of the clinic excluding No Patient Alerts: Patient on Blood Thinner cellular tissue based products placed in the center since last visit: Has Dressing in Place as Prescribed: Yes Has Compression in Place as Prescribed: Yes Pain Present Now: No Electronic Signature(s) Signed: 07/21/2022 11:04:44 AM By: Zachary Catholic RN Entered By: [name] on 07/21/2022 09:53:46 -------------------------------------------------------------------------------- Encounter Discharge Information Details Patient Name: Date of Service: [name]. 07/21/2022 10:00 A M Medical Record Number: [id number] Patient Account Number: [id number] Date of Birth/Sex: Treating RN: Sep 17, 1941 (81 y.o. [name] Primary Care [name]: [name] Other Clinician: Referring [name]: Treating [name]: Lenox Ponds in Treatment: 5 Encounter Discharge Information Items Post Procedure Vitals Discharge Condition: Stable Temperature (F): 97.5 Ambulatory Status: Wheelchair Pulse (bpm): 67 Discharge Destination: Home Respiratory Rate (breaths/min): 18 Transportation: Private Auto Blood Pressure (mmHg): 176/92 Accompanied By: wife Schedule Follow-up Appointment: Yes Clinical Summary of Care: Patient Declined Electronic Signature(s) Signed: 07/21/2022 4:25:07 PM By: Adline Peals Entered By: Adline Peals on 07/21/2022 10:21:29 [name] ([id number]) [id number] Page 2 of 8 -------------------------------------------------------------------------------- Lower Extremity Assessment Details Patient Name: Date of Service: Zachary, Neal 07/21/2022 10:00 A M Medical Record Number: [id number] Patient Account Number: [id number] Date of Birth/Sex: Treating RN: 1941/03/03 (81 y.o. [name] Primary Care [name]: [name] Other Clinician: Referring Pio Eatherly: Treating [name]/Extender: Lenox Ponds in Treatment: 5 Edema Assessment Assessed: [name]: No] [Right: No] Edema: [Left: Ye] [Right: s] Calf Left: Right: Point of Measurement: From Medial Instep 31.5 cm Ankle Left: Right: Point of Measurement: From Medial Instep 23.8 cm Vascular Assessment Pulses: Dorsalis Pedis Palpable: [Left:Yes] Electronic Signature(s) Signed: 07/21/2022 4:25:07 PM By: Adline Peals Entered By: Adline Peals on 07/21/2022 09:56:57 -------------------------------------------------------------------------------- Multi Wound Chart Details Patient Name: Date of Service: [name]. 07/21/2022 10:00 A M Medical Record Number: [id number] Patient Account Number: [id number] Date of Birth/Sex: Treating RN: 12-Mar-1941 (81 y.o. M) Primary Care Melessia Kaus: [name] Other  Clinician: Referring [name]: Treating Zarahi Fuerst/Extender: Lenox Ponds in Treatment: 5 Vital Signs Height(in): 72 Pulse(bpm): 67 Weight(lbs): 205 Blood Pressure(mmHg): 176/92 Body Mass Index(BMI): 27.8 Temperature(F): 97.5 Respiratory Rate(breaths/min): 18 [1:Photos:] [N/A:N/A [id number] Page 3 of 8] Left, Lateral Lower Leg N/A N/A Wound Location: Surgical Injury N/A N/A Wounding Event: Open Surgical Wound N/A N/A Primary Etiology: Cataracts, Arrhythmia, Deep Vein N/A N/A Comorbid History: Thrombosis, Hypertension, Osteoarthritis, Neuropathy, Seizure Disorder 05/09/2022 N/A N/A Date Acquired: 5 N/A N/A Weeks of Treatment: Open N/A N/A Wound Status: No N/A N/A Wound Recurrence: Yes N/A N/A Clustered Wound: 3 N/A N/A Clustered  Quantity: 5.5x1.1x0.2 N/A N/A Measurements L x W x D (cm) 4.752 N/A N/A A (cm) : rea 0.95 N/A N/A Volume (cm) : 63.60% N/A N/A % Reduction in A rea: 90.90% N/A N/A % Reduction in Volume: Full Thickness With Exposed Support N/A N/A Classification: Structures Medium N/A N/A Exudate A mount: Serosanguineous N/A N/A Exudate Type: red, brown N/A N/A Exudate Color: Distinct, outline attached N/A N/A Wound Margin: Large (67-100%) N/A N/A Granulation A mount: Red N/A N/A Granulation Quality: Small (1-33%) N/A N/A Necrotic A mount: Fat Layer (Subcutaneous Tissue): Yes N/A N/A Exposed Structures: Fascia: No Tendon: No Muscle: No Joint: No Bone: No Small (1-33%) N/A N/A Epithelialization: Debridement - Selective/Open Wound N/A N/A Debridement: Pre-procedure Verification/Time Out 10:03 N/A N/A Taken: Lidocaine 4% Topical Solution N/A N/A Pain Control: Slough N/A N/A Tissue Debrided: Non-Viable Tissue N/A N/A Level: 3.3 N/A N/A Debridement A (sq cm): rea Curette N/A N/A Instrument: Minimum N/A N/A Bleeding: Pressure N/A N/A Hemostasis A chieved: Procedure was  tolerated well N/A N/A Debridement Treatment Response: 5.5x1.1x0.1 N/A N/A Post Debridement Measurements L x W x D (cm) 0.475 N/A N/A Post Debridement Volume: (cm) No Abnormalities Noted N/A N/A Periwound Skin Texture: No Abnormalities Noted N/A N/A Periwound Skin Moisture: Erythema: No N/A N/A Periwound Skin Color: No Abnormality N/A N/A Temperature: Yes N/A N/A Tenderness on Palpation: Debridement N/A N/A Procedures Performed: Treatment Notes Wound #1 (Lower Leg) Wound Laterality: Left, Lateral Cleanser Peri-Wound Care Sween Lotion (Moisturizing lotion) Discharge Instruction: Apply moisturizing lotion as directed Topical Primary Dressing KerraCel Ag Gelling Fiber Dressing, 4x5 in (silver alginate) Discharge Instruction: Apply silver alginate to wound bed as instructed Secondary Dressing ABD Pad, 5x9 Discharge Instruction: Apply over primary dressing as directed. Woven Gauze Sponge, Non-Sterile 4x4 in Discharge Instruction: Apply over primary dressing as directed. Secured With Compression Wrap Kerlix Roll 4.5x3.1 (in/yd) Discharge Instruction: Apply Kerlix and Coban compression as directed. Zachary, Neal ([id number]) [id number] Page 4 of 8 Coban Self-Adherent Wrap 4x5 (in/yd) Discharge Instruction: Apply over Kerlix as directed. Compression Stockings Add-Ons Electronic Signature(s) Signed: 07/21/2022 10:09:07 AM By: Fredirick Maudlin MD FACS Entered By: Fredirick Maudlin on 07/21/2022 10:09:06 -------------------------------------------------------------------------------- Multi-Disciplinary Care Plan Details Patient Name: Date of Service: [name]. 07/21/2022 10:00 A M Medical Record Number: [id number] Patient Account Number: [id number] Date of Birth/Sex: Treating RN: 1940-10-23 (81 y.o. [name] Primary Care Shane Badeaux: [name] Other Clinician: Referring Edrees Valent: Treating Leandre Wien/Extender: Lenox Ponds in Treatment: 5 Multidisciplinary Care Plan reviewed with physician Active Inactive Venous Leg Ulcer Nursing Diagnoses: Potential for venous Insuffiency (use before diagnosis confirmed) Goals: Patient will maintain optimal edema control Date Initiated: 06/29/2022 Target Resolution Date: 08/11/2022 Goal Status: Active Interventions: Assess peripheral edema status every visit. Compression as ordered Treatment Activities: T ordered outside of clinic : 06/29/2022 est Therapeutic compression applied : 06/29/2022 Notes: Wound/Skin Impairment Nursing Diagnoses: Impaired tissue integrity Knowledge deficit related to ulceration/compromised skin integrity Goals: Patient/caregiver will verbalize understanding of skin care regimen Date Initiated: 06/14/2022 Target Resolution Date: 08/11/2022 Goal Status: Active Ulcer/skin breakdown will have a volume reduction of 30% by week 4 Date Initiated: 06/14/2022 Date Inactivated: 07/14/2022 Target Resolution Date: 07/12/2022 Goal Status: Met Ulcer/skin breakdown will have a volume reduction of 50% by week 8 Date Initiated: 07/14/2022 Target Resolution Date: 08/11/2022 Goal Status: Active Interventions: Assess patient/caregiver ability to obtain necessary supplies Assess patient/caregiver ability to perform ulcer/skin care regimen upon admission and as needed Assess ulceration(s) every visit Lindwood Qua Neal (  [id number]) [id number] Page 5 of 8 Provide education on ulcer and skin care Treatment Activities: Skin care regimen initiated : 06/14/2022 Topical wound management initiated : 06/14/2022 Notes: Electronic Signature(s) Signed: 07/21/2022 4:25:07 PM By: Adline Peals Entered By: Adline Peals on 07/21/2022 10:00:04 -------------------------------------------------------------------------------- Pain Assessment Details Patient Name: Date of Service: [name].  07/21/2022 10:00 [place] Record Number: [id number] Patient Account Number: [id number] Date of Birth/Sex: Treating RN: 1941-07-31 (81 y.o. [name] Primary Care Socorro Ebron: [name] Other Clinician: Referring Mulki Roesler: Treating Emojean Gertz/Extender: Lenox Ponds in Treatment: 5 Active Problems Location of Pain Severity and Description of Pain Patient Has Paino No Site Locations Rate the pain. Current Pain Level: 0 Pain Management and Medication Current Pain Management: Electronic Signature(s) Signed: 07/21/2022 4:25:07 PM By: Adline Peals Entered By: Adline Peals on 07/21/2022 09:54:26 -------------------------------------------------------------------------------- Patient/Caregiver Education Details Patient Name: Date of Service: [name] 10/27/2023andnbsp10:00 A M Medical Record Number: [id number] Patient Account Number: [id number] Date of Birth/Gender: Treating RN: 02/19/1941 (81 y.o. [name] Primary Care Physician: [name] Other Clinician: Ane Neal ([id number]) [id number] Page 6 of 8 Referring Physician: Treating Physician/Extender: Lenox Ponds in Treatment: 5 Education Assessment Education Provided To: Patient Education Topics Provided Wound/Skin Impairment: Methods: Explain/Verbal Responses: Reinforcements needed, State content correctly Electronic Signature(s) Signed: 07/21/2022 4:25:07 PM By: Adline Peals Entered By: Adline Peals on 07/21/2022 10:00:16 -------------------------------------------------------------------------------- Wound Assessment Details Patient Name: Date of Service: [name]. 07/21/2022 10:00 A M Medical Record Number: [id number] Patient Account Number: [id number] Date of Birth/Sex: Treating RN: 07-Mar-1941 (81 y.o. [name] Primary Care Avy Barlett: [name] Other Clinician: Referring Canden Cieslinski: Treating Channah Godeaux/Extender: Lenox Ponds in Treatment: 5 Wound Status Wound Number: 1 Primary Open Surgical Wound Etiology: Wound Location: Left, Lateral Lower Leg Wound Open Wounding Event: Surgical Injury Status: Date Acquired: 05/09/2022 Comorbid Cataracts, Arrhythmia, Deep Vein Thrombosis, Hypertension, Weeks Of Treatment: 5 History: Osteoarthritis, Neuropathy, Seizure Disorder Clustered Wound: Yes Photos Wound Measurements Length: (cm) Width: (cm) Depth: (cm) Clustered Quantity: Area: (cm) Volume: (cm) 5.5 % Reduction in Area: 63.6% 1.1 % Reduction in Volume: 90.9% 0.2 Epithelialization: Small (1-33%) 3 Tunneling: No 4.752 Undermining: No 0.95 Wound Description Classification: Full Thickness With Exposed Support Structures Wound Margin: Distinct, outline attached Exudate Amount: Medium Exudate Type: Serosanguineous Exudate Color: red, brown Zachary, CAVANAGH Neal ([id number]) Wound Bed Granulation Amount: Large (67-100%) Granulation Quality: Red Necrotic Amount: Small (1-33%) Necrotic Quality: Adherent Slough Foul Odor After Cleansing: No Slough/Fibrino Yes [id number] Page 7 of 8 Exposed Structure Fascia Exposed: No Fat Layer (Subcutaneous Tissue) Exposed: Yes Tendon Exposed: No Muscle Exposed: No Joint Exposed: No Bone Exposed: No Periwound Skin Texture Texture Color No Abnormalities Noted: Yes No Abnormalities Noted: Yes Moisture Temperature / Pain No Abnormalities Noted: Yes Temperature: No Abnormality Tenderness on Palpation: Yes Treatment Notes Wound #1 (Lower Leg) Wound Laterality: Left, Lateral Cleanser Peri-Wound Care Sween Lotion (Moisturizing lotion) Discharge Instruction: Apply moisturizing lotion as directed Topical Primary Dressing KerraCel Ag Gelling Fiber Dressing, 4x5 in (silver alginate) Discharge Instruction: Apply silver alginate to  wound bed as instructed Secondary Dressing ABD Pad, 5x9 Discharge Instruction: Apply over primary dressing as directed. Woven Gauze Sponge, Non-Sterile 4x4 in Discharge Instruction: Apply over primary dressing as directed. Secured With Compression Wrap Kerlix Roll 4.5x3.1 (in/yd) Discharge Instruction: Apply Kerlix and Coban compression as directed. Coban Self-Adherent Wrap 4x5 (in/yd) Discharge Instruction: Apply over Kerlix as directed.  Compression Stockings Add-Ons Electronic Signature(s) Signed: 07/21/2022 4:25:07 PM By: Adline Peals Entered By: Adline Peals on 07/21/2022 09:59:40 -------------------------------------------------------------------------------- Vitals Details Patient Name: Date of Service: [name]. 07/21/2022 10:00 A M Medical Record Number: [id number] Patient Account Number: [id number] Date of Birth/Sex: Treating RN: [birth year] (81 y.o. [name] Primary Care Deverick Pruss: [name] Other Clinician: Referring Tytiana Coles: Treating Chelsea Nusz/Extender: Lenox Ponds in Treatment: 5 Vital Signs Time Taken: 09:57 Temperature (F): 97.5 Zachary, Neal ([id number]) [id number] Page 8 of 8 Height (in): 72 Pulse (bpm): 67 Weight (lbs): 205 Respiratory Rate (breaths/min): 18 Body Mass Index (BMI): 27.8 Blood Pressure (mmHg): 176/92 Reference Range: 80 - 120 mg / dl Electronic Signature(s) Signed: 07/21/2022 4:25:07 PM By: Adline Peals Entered By: Adline Peals on 07/21/2022 09:57:26

## 2022-07-24 ENCOUNTER — Encounter (INDEPENDENT_AMBULATORY_CARE_PROVIDER_SITE_OTHER): Payer: Self-pay

## 2022-07-25 DIAGNOSIS — I4821 Permanent atrial fibrillation: Secondary | ICD-10-CM | POA: Diagnosis not present

## 2022-07-25 DIAGNOSIS — I824Y9 Acute embolism and thrombosis of unspecified deep veins of unspecified proximal lower extremity: Secondary | ICD-10-CM | POA: Diagnosis not present

## 2022-07-25 DIAGNOSIS — I2089 Other forms of angina pectoris: Secondary | ICD-10-CM | POA: Diagnosis not present

## 2022-07-25 DIAGNOSIS — I10 Essential (primary) hypertension: Secondary | ICD-10-CM | POA: Diagnosis not present

## 2022-07-28 ENCOUNTER — Encounter (HOSPITAL_BASED_OUTPATIENT_CLINIC_OR_DEPARTMENT_OTHER): Payer: Medicare Other | Attending: General Surgery | Admitting: General Surgery

## 2022-07-28 DIAGNOSIS — T8131XS Disruption of external operation (surgical) wound, not elsewhere classified, sequela: Secondary | ICD-10-CM | POA: Insufficient documentation

## 2022-07-28 DIAGNOSIS — I10 Essential (primary) hypertension: Secondary | ICD-10-CM | POA: Diagnosis not present

## 2022-07-28 DIAGNOSIS — L97326 Non-pressure chronic ulcer of left ankle with bone involvement without evidence of necrosis: Secondary | ICD-10-CM | POA: Diagnosis not present

## 2022-07-28 DIAGNOSIS — X58XXXS Exposure to other specified factors, sequela: Secondary | ICD-10-CM | POA: Diagnosis not present

## 2022-07-28 NOTE — Progress Notes (Signed)
[name], [name] ([id number]) [id number] Page 1 of 9 Visit Report for 07/28/2022 Chief Complaint Document Details Patient Name: Date of Service: [name], [name]. 07/28/2022 12:45 PM Medical Record Number: [id number] Patient Account Number: [id number] Date of Birth/Sex: Treating RN: 1940-12-13 (81 y.o. M) Primary Care Provider: Jerl MinaHedrick, Neal Other Clinician: Referring Provider: Treating Provider/Extender: [name], [name], Neal Weeks in Neal: 6 Information Obtained from: Patient Chief Complaint Patient presents to the wound care center with open non-healing surgical wound(s) Electronic Signature(s) Signed: 07/28/2022 1:14:21 PM By: Duanne Guessannon, Zachary Spink Neal FACS Entered By: Duanne Guessannon, Kace Hartje on 07/28/2022 13:14:20 -------------------------------------------------------------------------------- Debridement Details Patient Name: Date of Service: [name], [name]. 07/28/2022 12:45 PM Medical Record Number: [id number] Patient Account Number: [id number] Date of Birth/Sex: Treating RN: 1940-12-13 (81 y.o. [name]) Neal, Zachary Primary Care Provider: Jerl MinaHedrick, Neal Other Clinician: Referring Provider: Treating Provider/Extender: [name], [name], Neal Weeks in Neal: 6 Debridement Performed for Assessment: Wound #1 Left,Lateral Lower Leg Performed By: Physician Duanne Guessannon, [name] Debridement Type: Debridement Level of Consciousness (Pre-procedure): Awake and Alert Pre-procedure Verification/Time Out Yes - 13:06 Taken: Start Time: 13:06 Pain Control: Lidocaine 4% T opical Solution T Area Debrided (L x W): otal 2.5 (cm) x 1 (cm) = 2.5 (cm) Tissue and other material debrided: Non-Viable, Eschar, Slough, Slough Level: Non-Viable Tissue Debridement Description: Selective/Open Wound Instrument: Curette Bleeding: Minimum Hemostasis Achieved: Pressure Response to Neal: Procedure was tolerated well Level of Consciousness  (Post- Awake and Alert procedure): Post Debridement Measurements of Total Wound Length: (cm) 2.5 Width: (cm) 1 Depth: (cm) 0.1 Volume: (cm) 0.196 Character of Wound/Ulcer Post Debridement: Improved Post Procedure Diagnosis Same as [name] ([id number]) [id number] Page 2 of 9 Notes scribed for Dr. Lady Garyannon by Zachary Bruinaylor Herrington, RN Electronic Signature(s) Signed: 07/28/2022 1:28:12 PM By: Duanne Guessannon, Samariya Rockhold Neal FACS Signed: 07/28/2022 3:35:32 PM By: [name] Entered By: Zachary BruinHerrington, Zachary on 07/28/2022 13:07:07 -------------------------------------------------------------------------------- HPI Details Patient Name: Date of Service: [name], Kaven Neal. 07/28/2022 12:45 PM Medical Record Number: [id number] Patient Account Number: [id number] Date of Birth/Sex: Treating RN: 1940-12-13 (81 y.o. M) Primary Care Provider: Jerl MinaHedrick, Neal Other Clinician: Referring Provider: Treating Provider/Extender: [name]: 6 History of Present Illness HPI Description: ADMISSION 06/14/2022 This is an [age] suffered a left bimalleolar ankle fracture in November 2022. He underwent ORIF of the fracture. His postoperative course was complicated by areas of wound dehiscence. On May 09, 2022, he was taken to the operating room by orthopedics again for hardware removal. Cultures taken from the operating room demonstrated methicillin sensitive Staph aureus. He is currently taking doxycycline. He is not diabetic, nor does he smoke. ABI in clinic today was 0.86; he is going [place] have formal ABIs performed this [place] There is a linear wound running down his lateral left ankle. There are interspersed bands of epithelium. The wound does probe to bone over the malleolus. There is some slough accumulation but also some good granulation tissue filling in the wound bed. There is  a single nylon suture remaining in place. 06/21/2022: The wound measured a little bit smaller today. There is good granulation tissue filling in from the depths of the wound. There is a small area where bone is still accessible with a probe. Minimal slough and biofilm accumulation. He has completed his course of oral antibiotics. He did have formal ABIs performed on Friday and they were normal bilaterally. 06/29/2022: The wound continues to contract.  The more proximal and distal extensions of the incision have nearly closed. The main open portion of the wound continues to fill with granulation tissue. I am still able to probe to bone in 1 small area, but overall there has been good improvement. 10/13; the wound continues to contract. Healthy granulation. The deepest parts of the distal wound are actually in the mid aspect. Smaller areas superiorly look superficial. 10/20; minimally smaller. Using silver alginate. 07/21/2022: The wound continues to contract. The bone is completely covered. Good granulation tissue. 07/28/2022: The wound has contracted further. There is a healthy bed of granulation tissue present. Just a little bit of light slough accumulation. Electronic Signature(s) Signed: 07/28/2022 1:14:55 PM By: Duanne Guess Neal FACS Entered By: Duanne Guess on 07/28/2022 13:14:54 -------------------------------------------------------------------------------- Physical Exam Details Patient Name: Date of Service: Zachary Clock Neal. 07/28/2022 12:45 PM Medical Record Number: [id number] Patient Account Number: [id number] Date of Birth/Sex: Treating RN: 1941-01-21 (81 y.o. M) Primary Care Provider: Jerl Mina Other Clinician: Referring Provider: Treating Provider/Extender: Zachary Quest in Neal: 6 Constitutional Hypertensive, asymptomatic. . . . No acute distress.[name] ([id number]) [id number] Page 3 of  9 Respiratory Normal work of breathing on room air.. Notes 07/28/2022: The wound has contracted further. There is a healthy bed of granulation tissue present. Just a little bit of light slough accumulation. Electronic Signature(s) Signed: 07/28/2022 1:15:25 PM By: Duanne Guess Neal FACS Entered By: Duanne Guess on 07/28/2022 13:15:25 -------------------------------------------------------------------------------- Physician Orders Details Patient Name: Date of Service: Zachary Clock Neal. 07/28/2022 12:45 PM Medical Record Number: [id number] Patient Account Number: [id number] Date of Birth/Sex: Treating RN: [birth year] (81 y.o. 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 - Dr. Lady Gary RM 1 Return A Anesthetic Wound #1 Left,Lateral Lower Leg (In clinic) Topical Lidocaine 4% applied to wound bed - prior to [employer] May shower with protection but do not get wound dressing(s) wet. Edema Control - Lymphedema / SCD / Other Elevate legs to the level of the heart or above for 30 minutes daily and/or when sitting, a frequency of: - throughout the day Avoid standing for long periods of time. Exercise regularly Home Health Wound #1 Left,Lateral Lower Leg No change in wound care orders this week; continue Home Health for wound care. May utilize formulary equivalent dressing for wound Neal orders unless otherwise specified. Dressing changes to be completed by Home Health on  Monday / Wednesday / Friday except when patient has scheduled visit at [facility]. Other Home Health Orders/Instructions: - [facility] Wound Neal Wound #1 - Lower Leg Wound Laterality: Left, Lateral Peri-Wound Care: Sween Lotion (Moisturizing lotion) 3 x Per Week/30 Days Discharge Instructions: Apply moisturizing lotion as directed Prim Dressing: KerraCel Ag Gelling Fiber Dressing, 4x5 in (silver alginate) (Generic) 3 x Per Week/30 Days ary Discharge Instructions: Apply silver alginate to wound bed as instructed Secondary Dressing: ABD Pad, 5x9 (Generic) 3 x Per Week/30 Days Discharge Instructions: Apply over primary dressing as directed. Secondary Dressing: Optifoam Non-Adhesive Dressing, 4x4 in 3 x Per Week/30 Days Discharge Instructions: Apply to medial ankle bone to protect from irritation.  Secondary Dressing: Woven Gauze Sponge, Non-Sterile 4x4 in (Generic) 3 x Per Week/30 Days [name], [name] ([id number]) [phone].pdf Page 4 of 9 Discharge Instructions: Apply over primary dressing as directed. Compression Wrap: Kerlix Roll 4.5x3.1 (in/yd) (Generic) 3 x Per Week/30 Days Discharge Instructions: Apply Kerlix and Coban compression as directed. Compression Wrap: Coban Self-Adherent Wrap 4x5 (in/yd) (Generic) 3 x Per Week/30 Days Discharge Instructions: Apply over Kerlix as directed. Patient Medications llergies: codeine, fentanyl, regadenoson, verapamil, acetaminophen, Sulfa (Sulfonamide Antibiotics) A Notifications Medication Indication Start End 07/28/2022 lidocaine DOSE topical 4 % cream - cream topical Electronic Signature(s) Signed: 07/28/2022 1:28:12 PM By: Duanne Guess Neal FACS Entered By: Duanne Guess on 07/28/2022 13:15:36 -------------------------------------------------------------------------------- Problem List Details Patient Name: Date of Service: Zachary Clock Neal. 07/28/2022 12:45 PM Medical Record Number:  [id number] Patient Account Number: [id number] Date of Birth/Sex: Treating RN: [birth year] (81 y.o. 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 without 06/14/2022 No Yes evidence of necrosis T81.31XS Disruption of external operation (surgical) wound, not elsewhere classified, 06/14/2022 No Yes sequela I10 Essential (primary) hypertension 06/14/2022 No Yes Inactive Problems Resolved Problems Electronic Signature(s) Signed: 07/28/2022 1:14:09 PM By: Duanne Guess Neal FACS Entered By: Duanne Guess on 07/28/2022 13:14:08 [name] ([id number]) [id number] Page 5 of 9 -------------------------------------------------------------------------------- Progress Note Details Patient Name: Date of Service: [name], [name] 07/28/2022 12:45 PM Medical Record Number: [id number] Patient Account Number: [id number] Date of Birth/Sex: Treating RN: 1940-10-14 (81 y.o. M) Primary Care Provider: Jerl Mina Other Clinician: Referring Provider: Treating Provider/Extender: Zachary Quest in Neal: 6 Subjective Chief Complaint Information obtained from Patient Patient presents to the wound care center with open non-healing surgical wound(s) History of Present Illness (HPI) ADMISSION 06/14/2022 This is an 81 suffered a left bimalleolar ankle fracture in November 2022. He underwent ORIF of the fracture. His postoperative course was complicated by areas of wound dehiscence. On May 09, 2022, he was taken to the operating room by orthopedics again for hardware removal. Cultures taken from the operating room demonstrated methicillin sensitive Staph aureus. He is currently  taking doxycycline. He is not diabetic, nor does he smoke. ABI in clinic today was 0.86; he is going to have formal ABIs performed this Friday at the vascular surgery clinic in [place]. There is a linear wound running down his lateral left ankle. There are interspersed bands of epithelium. The wound does probe to bone over the malleolus. There is some slough accumulation but also some good granulation tissue filling in the wound bed. There is a single nylon suture remaining in place. 06/21/2022: The wound measured a little bit smaller today. There is good granulation tissue filling in from the depths of the wound. There is a small area where bone is still accessible with a probe. Minimal slough and biofilm accumulation. He has completed his course of oral antibiotics. He did have formal ABIs performed on Friday and they were normal bilaterally. 06/29/2022: The wound continues to contract. The more proximal and distal extensions of the incision have nearly closed. The main open portion of the wound continues to fill with granulation tissue. I am still able to probe to bone in 1 small area, but overall there has been good improvement. 10/13; the wound continues to contract. Healthy granulation. The deepest parts of the  distal wound are actually in the mid aspect. Smaller areas superiorly look superficial. 10/20; minimally smaller. Using silver alginate. 07/21/2022: The wound continues to contract. The bone is completely covered. Good granulation tissue. 07/28/2022: The wound has contracted further. There is a healthy bed of granulation tissue present. Just a little bit of light slough accumulation. Patient History Information obtained from Patient, Chart. Family History Heart Disease - Mother, Hypertension - Father, No family history of Cancer, Diabetes, Hereditary Spherocytosis, Kidney Disease, Lung Disease, Seizures, Stroke, Thyroid Problems, Tuberculosis. Social History Never smoker, Marital  Status - Married, Alcohol Use - Never, Drug Use - No History, Caffeine Use - Daily - coffee. Medical History Eyes Patient has history of Cataracts - right eye extraction Denies history of Glaucoma, Optic Neuritis Cardiovascular Patient has history of Arrhythmia - afib, Deep Vein Thrombosis - left leg, Hypertension Endocrine Denies history of Type I Diabetes, Type II Diabetes Genitourinary Denies history of End Stage Renal Disease Integumentary (Skin) Denies history of History of Burn Musculoskeletal Patient has history of Osteoarthritis Denies history of Osteomyelitis Neurologic Patient has history of Neuropathy, Seizure Disorder - hx Oncologic Denies history of Received Chemotherapy, Received Radiation Psychiatric Denies history of Anorexia/bulimia, Confinement Anxiety Medical A Surgical History Notes nd Cardiovascular hyperlipidemia Gastrointestinal hx GI bleed Genitourinary CAIO, DEVERA ([id number]) [id number] Page 6 of 9 hx pyelonephritis Neurologic hx cerebral hemorrhage Objective Constitutional Hypertensive, asymptomatic. No acute distress.. Vitals Time Taken: 12:52 PM, Height: 72 in, Weight: 205 lbs, BMI: 27.8, Temperature: 97.7 F, Pulse: 60 bpm, Respiratory Rate: 18 breaths/min, Blood Pressure: 182/95 mmHg. Respiratory Normal work of breathing on room air.. General Notes: 07/28/2022: The wound has contracted further. There is a healthy bed of granulation tissue present. Just a little bit of light slough accumulation. Integumentary (Hair, Skin) Wound #1 status is Open. Original cause of wound was Surgical Injury. The date acquired was: 05/09/2022. The wound has been in Neal 6 weeks. The wound is located on the Left,Lateral Lower Leg. The wound measures 2.5cm length x 1cm width x 0.1cm depth; 1.963cm^2 area and 0.196cm^3 volume. There is Fat Layer (Subcutaneous Tissue) exposed. There is no tunneling or undermining noted. There  is a medium amount of serosanguineous drainage noted. The wound margin is distinct with the outline attached to the wound base. There is large (67-100%) red granulation within the wound bed. There is a small (1-33%) amount of necrotic tissue within the wound bed including Adherent Slough. The periwound skin appearance had no abnormalities noted for moisture. The periwound skin appearance exhibited: Scarring, Erythema. The surrounding wound skin color is noted with erythema. Periwound temperature was noted as No Abnormality. The periwound has tenderness on palpation. 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 There was a Selective/Open Wound Non-Viable Tissue Debridement with a total area of 2.5 sq cm performed by Duanne Guess, Neal. With the following instrument(s): Curette to remove Non-Viable tissue/material. Material removed includes Eschar and Slough and after achieving pain control using Lidocaine 4% Topical Solution. No specimens were taken. A time out was conducted at 13:06, prior to the start of the procedure. A Minimum amount of bleeding was controlled with Pressure. The procedure was tolerated well. Post Debridement Measurements: 2.5cm length x 1cm width x 0.1cm depth; 0.196cm^3 volume. Character of  Wound/Ulcer Post Debridement is improved. Post procedure Diagnosis Wound #1: Same as Pre-Procedure General Notes: scribed for Dr. Celine Ahr by Adline Peals, RN. Plan Follow-up Appointments: Return Appointment in 1 week. - Dr. Celine Ahr RM 1 Anesthetic: Wound #1 Left,Lateral Lower Leg: (In clinic) Topical Lidocaine 4% applied to wound bed - prior to  debridemnet Bathing/ Shower/ Hygiene: May shower with protection but do not get wound dressing(s) wet. Edema Control - Lymphedema / SCD / Other: Elevate legs to the level of the heart or above for 30 minutes daily and/or when sitting, a frequency of: - throughout the day Avoid standing for long periods of time. Exercise regularly Home Health: Wound #1 Left,Lateral Lower Leg: No change in wound care orders this week; continue Home Health for wound care. May utilize formulary equivalent dressing for wound Neal orders [name], [name] ([id number]) [id number] Page 7 of 9 unless otherwise specified. Dressing changes to be completed by [place] on Monday / Wednesday / Friday except when patient has scheduled visit at [facility] Other Home Health Orders/Instructions: - [facility] The following medication(s) was prescribed: lidocaine topical 4 % cream cream topical was prescribed at facility WOUND #1: - Lower Leg Wound Laterality: Left, Lateral Peri-Wound Care: Sween Lotion (Moisturizing lotion) 3 x Per Week/30 Days Discharge Instructions: Apply moisturizing lotion as directed Prim Dressing: KerraCel Ag Gelling Fiber Dressing, 4x5 in (silver alginate) (Generic) 3 x Per Week/30 Days ary Discharge Instructions: Apply silver alginate to wound bed as instructed Secondary Dressing: ABD Pad, 5x9 (Generic) 3 x Per Week/30 Days Discharge Instructions: Apply over primary dressing as directed. Secondary Dressing: Optifoam Non-Adhesive Dressing, 4x4 in 3 x Per Week/30 Days Discharge Instructions: Apply to medial ankle bone to protect from irritation. Secondary Dressing: Woven Gauze Sponge, Non-Sterile 4x4 in (Generic) 3 x Per Week/30 Days Discharge Instructions: Apply over primary dressing as directed. Com pression Wrap: Kerlix Roll 4.5x3.1 (in/yd) (Generic) 3 x Per Week/30 Days Discharge Instructions: Apply Kerlix and Coban compression as directed. Com pression  Wrap: Coban Self-Adherent Wrap 4x5 (in/yd) (Generic) 3 x Per Week/30 Days Discharge Instructions: Apply over Kerlix as directed. 07/28/2022: The wound has contracted further. There is a healthy bed of granulation tissue present. Just a little bit of light slough accumulation. I used a curette to debride slough and a little bit of eschar off of the wound. We will continue to use silver alginate with Kerlix and Coban. Follow-up in 1 week. Electronic Signature(s) Signed: 07/28/2022 1:15:58 PM By: Fredirick Maudlin Neal FACS Entered By: Fredirick Maudlin on 07/28/2022 13:15:57 -------------------------------------------------------------------------------- HxROS Details Patient Name: Date of Service: Zachary Alto Neal. 07/28/2022 12:45 PM Medical Record Number: [id number] Patient Account Number: [id number] Date of Birth/Sex: Treating RN: 03-Mar-1941 (81 y.o. M) Primary Care Provider: [name] Other Clinician: Referring Provider: Treating Provider/Extender: Lenox Ponds in Neal: 6 Information Obtained From Patient Chart Eyes Medical History: Positive for: Cataracts - right eye extraction Negative for: Glaucoma; Optic Neuritis Cardiovascular Medical History: Positive for: Arrhythmia - afib; Deep Vein Thrombosis - left leg; Hypertension Past Medical History Notes: hyperlipidemia Gastrointestinal Medical History: Past Medical History Notes: hx GI bleed Endocrine Medical History: Negative for: Type I Diabetes; Type II Diabetes Genitourinary Medical HistoryREQUAN, HARDGE ([id number]) [id number] Page 8 of 9 Negative for: End Stage Renal Disease Past Medical History Notes: hx pyelonephritis Integumentary (Skin) Medical History: Negative for: History of Burn Musculoskeletal Medical History: Positive for: Osteoarthritis Negative for: Osteomyelitis Neurologic Medical History: Positive for: Neuropathy; Seizure  Disorder -  hx Past Medical History Notes: hx cerebral hemorrhage Oncologic Medical History: Negative for: Received Chemotherapy; Received Radiation Psychiatric Medical History: Negative for: Anorexia/bulimia; Confinement Anxiety HBO Extended History Items Eyes: Cataracts Immunizations Pneumococcal Vaccine: Received Pneumococcal Vaccination: Yes Received Pneumococcal Vaccination On or After 60th Birthday: Yes Implantable Devices None Family and Social History Cancer: No; Diabetes: No; Heart Disease: Yes - Mother; Hereditary Spherocytosis: No; Hypertension: Yes - Father; Kidney Disease: No; Lung Disease: No; Seizures: No; Stroke: No; Thyroid Problems: No; Tuberculosis: No; Never smoker; Marital Status - Married; Alcohol Use: Never; Drug Use: No History; Caffeine Use: Daily - coffee; Financial Concerns: No; Food, Clothing or Shelter Needs: No; Support System Lacking: No; Transportation Concerns: Yes - caregiver provides rides in am Electronic Signature(s) Signed: 07/28/2022 1:28:12 PM By: Duanne Guess Neal FACS Entered By: Duanne Guess on 07/28/2022 13:15:00 -------------------------------------------------------------------------------- SuperBill Details Patient Name: Date of Service: Zachary Clock Neal. 07/28/2022 Medical Record Number: [id number] Patient Account Number: [id number] Date of Birth/Sex: Treating RN: [birth year] (81 y.o. 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 T81.31XS Disruption of external operation (surgical) wound, not elsewhere classified, sequela I10 Essential (primary) hypertension Facility  Procedures : CPT4 Code: [id number] Description: 97597 - DEBRIDE WOUND 1ST 20 SQ CM OR < ICD-10 Diagnosis Description 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 without evidence Modifier: of necrosis Quantity: 1 Physician Procedures : CPT4 Code Description Modifier [id number] - WC PHYS LEVEL 3 - EST PT 25 ICD-10 Diagnosis Description 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

## 2022-07-28 NOTE — Progress Notes (Signed)
TYRAE, ALCOSER ([id number]) [id number] Page 1 of 7 Visit Report for 07/28/2022 Arrival Information Details Patient Name: Date of Service: [name], [name] 07/28/2022 12:45 PM Medical Record Number: [id number] Patient Account Number: [id number] Date of Birth/Sex: Treating RN: 1940/11/27 (81 y.o. Janyth Contes Primary Care Colson Barco: [name] Other Clinician: Referring Shanea Karney: Treating Adryana Mogensen/Extender: Lenox Ponds in Treatment: 6 Visit Information History Since Last Visit Added or deleted any medications: No Patient Arrived: Wheel Chair Any new allergies or adverse reactions: No Arrival Time: 12:50 Had a fall or experienced change in No Accompanied By: wife activities of daily living that may affect Transfer Assistance: None risk of falls: Patient Identification Verified: Yes Signs or symptoms of abuse/neglect since last visito No Secondary Verification Process Completed: Yes Hospitalized since last visit: No Patient Requires Transmission-Based Precautions: No Implantable device outside of the clinic excluding No Patient Has Alerts: Yes cellular tissue based products placed in the center Patient Alerts: Patient on Blood Thinner since last visit: Has Dressing in Place as Prescribed: Yes Has Compression in Place as Prescribed: Yes Pain Present Now: No Electronic Signature(s) Signed: 07/28/2022 3:35:32 PM By: Adline Peals Entered By: Adline Peals on 07/28/2022 12:52:23 -------------------------------------------------------------------------------- Encounter Discharge Information Details Patient Name: Date of Service: Zachary Alto E. 07/28/2022 12:45 PM Medical Record Number: [id number] Patient Account Number: [id number] Date of Birth/Sex: Treating RN: 1941-05-10 (81 y.o. Janyth Contes Primary Care Davius Goudeau: [name] Other Clinician: Referring Tait Balistreri: Treating  Jarris Kortz/Extender: Lenox Ponds in Treatment: 6 Encounter Discharge Information Items Post Procedure Vitals Discharge Condition: Stable Temperature (F): 97.7 Ambulatory Status: Wheelchair Pulse (bpm): 60 Discharge Destination: Home Respiratory Rate (breaths/min): 18 Transportation: Private Auto Blood Pressure (mmHg): 182/95 Accompanied By: wife Schedule Follow-up Appointment: Yes Clinical Summary of Care: Patient Declined Electronic Signature(s) Signed: 07/28/2022 3:35:32 PM By: Adline Peals Entered By: Adline Peals on 07/28/2022 13:23:30 [name] ([id number]) [id number] Page 2 of 7 -------------------------------------------------------------------------------- Lower Extremity Assessment Details Patient Name: Date of Service: TERRI, MALERBA 07/28/2022 12:45 PM Medical Record Number: [id number] Patient Account Number: [id number] Date of Birth/Sex: Treating RN: 06/18/1941 (81 y.o. Janyth Contes Primary Care Alasia Enge: [name] Other Clinician: Referring Gilmar Bua: Treating Toniyah Dilmore/Extender: Lenox Ponds in Treatment: 6 Edema Assessment Assessed: Shirlyn Goltz: No] [Right: No] Edema: [Left: Ye] [Right: s] Calf Left: Right: Point of Measurement: From Medial Instep 31.3 cm Ankle Left: Right: Point of Measurement: From Medial Instep 24.8 cm Vascular Assessment Pulses: Dorsalis Pedis Palpable: [Left:Yes] Electronic Signature(s) Signed: 07/28/2022 3:35:32 PM By: Adline Peals Entered By: Adline Peals on 07/28/2022 12:58:50 -------------------------------------------------------------------------------- Multi Wound Chart Details Patient Name: Date of Service: Zachary Alto E. 07/28/2022 12:45 PM Medical Record Number: [id number] Patient Account Number: [id number] Date of Birth/Sex: Treating RN: [birth year] (81 y.o. M) Primary Care Woodroe Vogan: [name] Other Clinician: Referring Sequoya Hogsett: Treating Shirley Decamp/Extender: Lenox Ponds in Treatment: 6 Vital Signs Height(in): 72 Pulse(bpm): 60 Weight(lbs): 205 Blood Pressure(mmHg): 182/95 Body Mass Index(BMI): 27.8 Temperature(F): 97.7 Respiratory Rate(breaths/min): 18 [1:Photos:] [N/A:N/A] Left, Lateral Lower Leg N/A N/A Wound Location: Surgical Injury N/A N/A Wounding Event: Open Surgical Wound N/A N/A Primary Etiology: Cataracts, Arrhythmia, Deep Vein N/A N/A Comorbid History: Thrombosis, Hypertension, Osteoarthritis, Neuropathy, Seizure Disorder 05/09/2022 N/A N/A Date Acquired: 6 N/A N/A Weeks of Treatment: Open N/A N/A Wound Status: No N/A N/A Wound Recurrence: Yes N/A N/A Clustered Wound: 3 N/A N/A Clustered Quantity: 2.5x1x0.1 N/A N/A Measurements  L x W x D (cm) 1.963 N/A N/A A (cm) : rea 0.196 N/A N/A Volume (cm) : 85.00% N/A N/A % Reduction in A rea: 98.10% N/A N/A % Reduction in Volume: Full Thickness With Exposed Support N/A N/A Classification: Structures Medium N/A N/A Exudate A mount: Serosanguineous N/A N/A Exudate Type: red, brown N/A N/A Exudate Color: Distinct, outline attached N/A N/A Wound Margin: Large (67-100%) N/A N/A Granulation A mount: Red N/A N/A Granulation Quality: Small (1-33%) N/A N/A Necrotic A mount: Fat Layer (Subcutaneous Tissue): Yes N/A N/A Exposed Structures: Fascia: No Tendon: No Muscle: No Joint: No Bone: No Medium (34-66%) N/A N/A Epithelialization: Debridement - Selective/Open Wound N/A N/A Debridement: Pre-procedure Verification/Time Out 13:06 N/A N/A Taken: Lidocaine 4% Topical Solution N/A N/A Pain Control: Necrotic/Eschar, Slough N/A N/A Tissue Debrided: Non-Viable Tissue N/A N/A Level: 2.5 N/A N/A Debridement A (sq cm): rea Curette N/A N/A Instrument: Minimum N/A N/A Bleeding: Pressure N/A N/A Hemostasis A chieved: Procedure was tolerated well N/A  N/A Debridement Treatment Response: 2.5x1x0.1 N/A N/A Post Debridement Measurements L x W x D (cm) 0.196 N/A N/A Post Debridement Volume: (cm) Scarring: Yes N/A N/A Periwound Skin Texture: No Abnormalities Noted N/A N/A Periwound Skin Moisture: Erythema: Yes N/A N/A Periwound Skin Color: No Abnormality N/A N/A Temperature: Yes N/A N/A Tenderness on Palpation: Debridement N/A N/A Procedures Performed: Treatment Notes Electronic Signature(s) Signed: 07/28/2022 1:14:15 PM By: Fredirick Maudlin MD FACS Entered By: Fredirick Maudlin on 07/28/2022 13:14:15 -------------------------------------------------------------------------------- Multi-Disciplinary Care Plan Details Patient Name: Date of Service: Zachary Alto E. 07/28/2022 12:45 PM Medical Record Number: [id number] Patient Account Number: [id number] Date of Birth/Sex: Treating RN: [birth year] (81 y.o. Janyth Contes Primary Care Alaiyah Bollman: [name] Other Clinician: Referring Rylynne Schicker: Treating Jossalyn Forgione/Extender: Lenox Ponds in Treatment: 6 Multidisciplinary Care Plan reviewed with physician [name], [name] ([id number]) [id number] Page 4 of 7 Active Inactive Venous Leg Ulcer Nursing Diagnoses: Potential for venous Insuffiency (use before diagnosis confirmed) Goals: Patient will maintain optimal edema control Date Initiated: 06/29/2022 Target Resolution Date: 08/11/2022 Goal Status: Active Interventions: Assess peripheral edema status every visit. Compression as ordered Treatment Activities: T ordered outside of clinic : 06/29/2022 est Therapeutic compression applied : 06/29/2022 Notes: Wound/Skin Impairment Nursing Diagnoses: Impaired tissue integrity Knowledge deficit related to ulceration/compromised skin integrity Goals: Patient/caregiver will verbalize understanding of skin care regimen Date Initiated: 06/14/2022 Target Resolution Date:  08/11/2022 Goal Status: Active Ulcer/skin breakdown will have a volume reduction of 30% by week 4 Date Initiated: 06/14/2022 Date Inactivated: 07/14/2022 Target Resolution Date: 07/12/2022 Goal Status: Met Ulcer/skin breakdown will have a volume reduction of 50% by week 8 Date Initiated: 07/14/2022 Target Resolution Date: 08/11/2022 Goal Status: Active Interventions: Assess patient/caregiver ability to obtain necessary supplies Assess patient/caregiver ability to perform ulcer/skin care regimen upon admission and as needed Assess ulceration(s) every visit Provide education on ulcer and skin care Treatment Activities: Skin care regimen initiated : 06/14/2022 Topical wound management initiated : 06/14/2022 Notes: Electronic Signature(s) Signed: 07/28/2022 3:35:32 PM By: Adline Peals Entered By: Adline Peals on 07/28/2022 13:03:52 -------------------------------------------------------------------------------- Pain Assessment Details Patient Name: Date of Service: Zachary Alto E. 07/28/2022 12:45 PM Medical Record Number: [id number] Patient Account Number: [id number] Date of Birth/Sex: Treating RN: 04-Apr-1941 (81 y.o. Janyth Contes Primary Care Kevionna Heffler: [name] Other Clinician: Referring Daniela Hernan: Treating Winnie Umali/Extender: Lenox Ponds in Treatment: 6 Active Problems Location of Pain Severity and Description of Pain [name], [name] ([id number]) [phone].pdf Page 5 of  7 Patient Has Paino No Site Locations Rate the pain. Current Pain Level: 0 Pain Management and Medication Current Pain Management: Electronic Signature(s) Signed: 07/28/2022 3:35:32 PM By: Adline Peals Entered By: Adline Peals on 07/28/2022 12:52:29 -------------------------------------------------------------------------------- Patient/Caregiver Education Details Patient Name: Date of Service: [name] 11/3/2023andnbsp12:45 PM Medical Record Number: [id number] Patient Account Number: [id number] Date of Birth/Gender: Treating RN: Jul 08, 1941 (81 y.o. Janyth Contes Primary Care Physician: [name] Other Clinician: Referring Physician: Treating Physician/Extender: Lenox Ponds in Treatment: 6 Education Assessment Education Provided To: Patient Education Topics Provided Wound/Skin Impairment: Methods: Explain/Verbal Responses: Reinforcements needed, State content correctly Electronic Signature(s) Signed: 07/28/2022 3:35:32 PM By: Adline Peals Entered By: Adline Peals on 07/28/2022 13:04:03 -------------------------------------------------------------------------------- Wound Assessment Details Patient Name: Date of Service: Zachary Alto E. 07/28/2022 12:45 PM Medical Record Number: [id number] Patient Account Number: [id number] Date of Birth/Sex: Treating RN: 02-10-1941 (81 y.o. Zaiah, [name], [name] E ([id number]) [phone].pdf Page 6 of 7 Primary Care Dyneisha Murchison: [name] Other Clinician: Referring Kodiak Rollyson: Treating Peony Barner/Extender: Lenox Ponds in Treatment: 6 Wound Status Wound Number: 1 Primary Open Surgical Wound Etiology: Wound Location: Left, Lateral Lower Leg Wound Open Wounding Event: Surgical Injury Status: Date Acquired: 05/09/2022 Comorbid Cataracts, Arrhythmia, Deep Vein Thrombosis, Hypertension, Weeks Of Treatment: 6 History: Osteoarthritis, Neuropathy, Seizure Disorder Clustered Wound: Yes Photos Wound Measurements Length: (cm) Width: (cm) Depth: (cm) Clustered Quantity: Area: (cm) Volume: (cm) 2.5 % Reduction in Area: 85% 1 % Reduction in Volume: 98.1% 0.1 Epithelialization: Medium (34-66%) 3 Tunneling: No 1.963 Undermining: No 0.196 Wound Description Classification: Full Thickness With Exposed Suppor Wound Margin:  Distinct, outline attached Exudate Amount: Medium Exudate Type: Serosanguineous Exudate Color: red, brown t Structures Foul Odor After Cleansing: No Slough/Fibrino Yes Wound Bed Granulation Amount: Large (67-100%) Exposed Structure Granulation Quality: Red Fascia Exposed: No Necrotic Amount: Small (1-33%) Fat Layer (Subcutaneous Tissue) Exposed: Yes Necrotic Quality: Adherent Slough Tendon Exposed: No Muscle Exposed: No Joint Exposed: No Bone Exposed: No Periwound Skin Texture Texture Color No Abnormalities Noted: No No Abnormalities Noted: No Scarring: Yes Erythema: Yes Moisture Temperature / Pain No Abnormalities Noted: Yes Temperature: No Abnormality Tenderness on Palpation: Yes Treatment Notes Wound #1 (Lower Leg) Wound Laterality: Left, Lateral Cleanser Peri-Wound Care Sween Lotion (Moisturizing lotion) Discharge Instruction: Apply moisturizing lotion as directed Topical Primary Dressing KerraCel Ag Gelling Fiber Dressing, 4x5 in (silver alginate) Discharge Instruction: Apply silver alginate to wound bed as instructed [name], [name] ([id number]) [id number] Page 7 of 7 Secondary Dressing ABD Pad, 5x9 Discharge Instruction: Apply over primary dressing as directed. Optifoam Non-Adhesive Dressing, 4x4 in Discharge Instruction: Apply to medial ankle bone to protect from irritation. Woven Gauze Sponge, Non-Sterile 4x4 in Discharge Instruction: Apply over primary dressing as directed. Secured With Compression Wrap Kerlix Roll 4.5x3.1 (in/yd) Discharge Instruction: Apply Kerlix and Coban compression as directed. Coban Self-Adherent Wrap 4x5 (in/yd) Discharge Instruction: Apply over Kerlix as directed. Compression Stockings Add-Ons Electronic Signature(s) Signed: 07/28/2022 3:35:32 PM By: Adline Peals Entered By: Adline Peals on 07/28/2022  13:00:55 -------------------------------------------------------------------------------- Vitals Details Patient Name: Date of Service: Zachary Alto E. 07/28/2022 12:45 PM Medical Record Number: [id number] Patient Account Number: [id number] Date of Birth/Sex: Treating RN: 1940/11/03 (81 y.o. Janyth Contes Primary Care Jalani Cullifer: [name] Other Clinician: Referring Deveney Bayon: Treating Dania Marsan/Extender: Lenox Ponds in Treatment: 6 Vital Signs Time Taken: 12:52 Temperature (F): 97.7 Height (in): 72 Pulse (bpm): 60 Weight (lbs): 205  Respiratory Rate (breaths/min): 18 Body Mass Index (BMI): 27.8 Blood Pressure (mmHg): 182/95 Reference Range: 80 - 120 mg / dl Electronic Signature(s) Signed: 07/28/2022 3:35:32 PM By: Adline Peals Entered By: Adline Peals on 07/28/2022 12:53:20

## 2022-08-01 DIAGNOSIS — I4821 Permanent atrial fibrillation: Secondary | ICD-10-CM | POA: Diagnosis not present

## 2022-08-04 ENCOUNTER — Encounter (HOSPITAL_BASED_OUTPATIENT_CLINIC_OR_DEPARTMENT_OTHER): Payer: Medicare Other | Admitting: General Surgery

## 2022-08-04 DIAGNOSIS — L97326 Non-pressure chronic ulcer of left ankle with bone involvement without evidence of necrosis: Secondary | ICD-10-CM | POA: Diagnosis not present

## 2022-08-04 DIAGNOSIS — I10 Essential (primary) hypertension: Secondary | ICD-10-CM | POA: Diagnosis not present

## 2022-08-04 DIAGNOSIS — T8131XS Disruption of external operation (surgical) wound, not elsewhere classified, sequela: Secondary | ICD-10-CM | POA: Diagnosis not present

## 2022-08-04 NOTE — Progress Notes (Signed)
[name] ([id number]) [id number] Page 1 of 8 Visit Report for 08/04/2022 Arrival Information Details Patient Name: Date of Service: [name], [name] 08/04/2022 10:45 A M Medical Record Number: [id number] Patient Account Number: [id number] Date of Birth/Sex: Treating RN: 1941-08-10 (81 y.o. Janyth Contes Primary Care Jameca Chumley: [name] Other Clinician: Referring Alexy Heldt: Treating Chyla Schlender/Extender: Lenox Ponds in Treatment: 7 Visit Information History Since Last Visit Added or deleted any medications: No Patient Arrived: Wheel Chair Any new allergies or adverse reactions: No Arrival Time: 10:54 Had a fall or experienced change in No Accompanied By: wife activities of daily living that may affect Transfer Assistance: None risk of falls: Patient Identification Verified: Yes Signs or symptoms of abuse/neglect since last visito No Secondary Verification Process Completed: Yes Hospitalized since last visit: No Patient Requires Transmission-Based Precautions: No Implantable device outside of the clinic excluding No Patient Has Alerts: Yes cellular tissue based products placed in the center Patient Alerts: Patient on Blood Thinner since last visit: Has Dressing in Place as Prescribed: Yes Has Compression in Place as Prescribed: Yes Pain Present Now: No Electronic Signature(s) Signed: 08/04/2022 3:57:54 PM By: Adline Peals Entered By: Adline Peals on 08/04/2022 10:54:47 -------------------------------------------------------------------------------- Encounter Discharge Information Details Patient Name: Date of Service: Zachary Alto E. 08/04/2022 10:45 A M Medical Record Number: [id number] Patient Account Number: [id number] Date of Birth/Sex: Treating RN: March 03, 1941 (81 y.o. Janyth Contes Primary Care Eve Rey: [name] Other Clinician: Referring Dailey Buccheri: Treating  Branson Kranz/Extender: Lenox Ponds in Treatment: 7 Encounter Discharge Information Items Post Procedure Vitals Discharge Condition: Stable Temperature (F): 98.3 Ambulatory Status: Wheelchair Pulse (bpm): 60 Discharge Destination: Home Respiratory Rate (breaths/min): 18 Transportation: Private Auto Blood Pressure (mmHg): 185/84 Accompanied By: wife Schedule Follow-up Appointment: Yes Clinical Summary of Care: Patient Declined Electronic Signature(s) Signed: 08/04/2022 3:57:54 PM By: Adline Peals Entered By: Adline Peals on 08/04/2022 11:28:31 [name] ([id number]) [id number] Page 2 of 8 -------------------------------------------------------------------------------- Lower Extremity Assessment Details Patient Name: Date of Service: [name] 08/04/2022 10:45 A M Medical Record Number: [id number] Patient Account Number: [id number] Date of Birth/Sex: Treating RN: 1941/08/26 (81 y.o. Janyth Contes Primary Care Keerstin Bjelland: [name] Other Clinician: Referring Erica Richwine: Treating Verne Cove/Extender: Lenox Ponds in Treatment: 7 Edema Assessment Assessed: [Left: No] [Right: No] Edema: [Left: Ye] [Right: s] Calf Left: Right: Point of Measurement: From Medial Instep 32.5 cm Ankle Left: Right: Point of Measurement: From Medial Instep 23.1 cm Vascular Assessment Pulses: Dorsalis Pedis Palpable: [Left:Yes] Electronic Signature(s) Signed: 08/04/2022 3:57:54 PM By: Adline Peals Entered By: Adline Peals on 08/04/2022 11:00:48 -------------------------------------------------------------------------------- Multi Wound Chart Details Patient Name: Date of Service: Zachary Alto E. 08/04/2022 10:45 A M Medical Record Number: [id number] Patient Account Number: [id number] Date of Birth/Sex: Treating RN: [birth year] (81 y.o. M) Primary Care Dareion Kneece:  [name] Other Clinician: Referring Clark Clowdus: Treating Ahmiyah Coil/Extender: Lenox Ponds in Treatment: 7 Vital Signs Height(in): 72 Pulse(bpm): 60 Weight(lbs): 205 Blood Pressure(mmHg): 185/84 Body Mass Index(BMI): 27.8 Temperature(F): 98.3 Respiratory Rate(breaths/min): 18 [1:Photos:] [N/A:N/A [id number] Page 3 of 8] Left, Lateral Lower Leg N/A N/A Wound Location: Surgical Injury N/A N/A Wounding Event: Open Surgical Wound N/A N/A Primary Etiology: Cataracts, Arrhythmia, Deep Vein N/A N/A Comorbid History: Thrombosis, Hypertension, Osteoarthritis, Neuropathy, Seizure Disorder 05/09/2022 N/A N/A Date Acquired: 7 N/A N/A Weeks of Treatment: Open N/A N/A Wound Status: No N/A N/A Wound Recurrence: Yes N/A N/A Clustered Wound:  3 N/A N/A Clustered Quantity: 2x0.9x0.1 N/A N/A Measurements L x W x D (cm) 1.414 N/A N/A A (cm) : rea 0.141 N/A N/A Volume (cm) : 89.20% N/A N/A % Reduction in A rea: 98.70% N/A N/A % Reduction in Volume: Full Thickness With Exposed Support N/A N/A Classification: Structures Medium N/A N/A Exudate A mount: Serosanguineous N/A N/A Exudate Type: red, brown N/A N/A Exudate Color: Distinct, outline attached N/A N/A Wound Margin: Large (67-100%) N/A N/A Granulation A mount: Red, Pink N/A N/A Granulation Quality: Small (1-33%) N/A N/A Necrotic A mount: Fat Layer (Subcutaneous Tissue): Yes N/A N/A Exposed Structures: Fascia: No Tendon: No Muscle: No Joint: No Bone: No Medium (34-66%) N/A N/A Epithelialization: Debridement - Selective/Open Wound N/A N/A Debridement: Pre-procedure Verification/Time Out 11:10 N/A N/A Taken: Lidocaine 4% Topical Solution N/A N/A Pain Control: Necrotic/Eschar, Slough N/A N/A Tissue Debrided: Non-Viable Tissue N/A N/A Level: 1.8 N/A N/A Debridement A (sq cm): rea Curette N/A N/A Instrument: Minimum N/A N/A Bleeding: Pressure N/A  N/A Hemostasis A chieved: Procedure was tolerated well N/A N/A Debridement Treatment Response: 2x0.9x0.1 N/A N/A Post Debridement Measurements L x W x D (cm) 0.141 N/A N/A Post Debridement Volume: (cm) Scarring: Yes N/A N/A Periwound Skin Texture: No Abnormalities Noted N/A N/A Periwound Skin Moisture: Erythema: Yes N/A N/A Periwound Skin Color: No Abnormality N/A N/A Temperature: Yes N/A N/A Tenderness on Palpation: Debridement N/A N/A Procedures Performed: Treatment Notes Wound #1 (Lower Leg) Wound Laterality: Left, Lateral Cleanser Peri-Wound Care Sween Lotion (Moisturizing lotion) Discharge Instruction: Apply moisturizing lotion as directed Topical Primary Dressing Hydrofera Blue Ready Foam, 2.5 x2.5 in Discharge Instruction: Apply to wound bed as instructed Secondary Dressing ABD Pad, 5x9 Discharge Instruction: Apply over primary dressing as directed. Optifoam Non-Adhesive Dressing, 4x4 in Discharge Instruction: Apply to medial ankle bone to protect from irritation. Woven Gauze Sponge, Non-Sterile 4x4 in Discharge Instruction: Apply over primary dressing as directed. Secured With UGO, THOMA ([id number]) [id number] Page 4 of 8 Compression Wrap Kerlix Roll 4.5x3.1 (in/yd) Discharge Instruction: Apply Kerlix and Coban compression as directed. Coban Self-Adherent Wrap 4x5 (in/yd) Discharge Instruction: Apply over Kerlix as directed. Compression Stockings Add-Ons Electronic Signature(s) Signed: 08/04/2022 11:54:22 AM By: Fredirick Maudlin MD FACS Entered By: Fredirick Maudlin on 08/04/2022 11:54:22 -------------------------------------------------------------------------------- Multi-Disciplinary Care Plan Details Patient Name: Date of Service: Zachary Alto E. 08/04/2022 10:45 A M Medical Record Number: [id number] Patient Account Number: [id number] Date of Birth/Sex: Treating RN: 12/11/1940 (81 y.o. Janyth Contes Primary Care Osiah Haring: [name] Other Clinician: Referring Myshawn Chiriboga: Treating Cotina Freedman/Extender: Lenox Ponds in Treatment: 7 Multidisciplinary Care Plan reviewed with physician Active Inactive Venous Leg Ulcer Nursing Diagnoses: Potential for venous Insuffiency (use before diagnosis confirmed) Goals: Patient will maintain optimal edema control Date Initiated: 06/29/2022 Target Resolution Date: 09/22/2022 Goal Status: Active Interventions: Assess peripheral edema status every visit. Compression as ordered Treatment Activities: T ordered outside of clinic : 06/29/2022 est Therapeutic compression applied : 06/29/2022 Notes: Wound/Skin Impairment Nursing Diagnoses: Impaired tissue integrity Knowledge deficit related to ulceration/compromised skin integrity Goals: Patient/caregiver will verbalize understanding of skin care regimen Date Initiated: 06/14/2022 Date Inactivated: 08/04/2022 Target Resolution Date: 08/11/2022 Goal Status: Met Ulcer/skin breakdown will have a volume reduction of 30% by week 4 Date Initiated: 06/14/2022 Date Inactivated: 07/14/2022 Target Resolution Date: 07/12/2022 Goal Status: Met Ulcer/skin breakdown will have a volume reduction of 50% by week 8 Date Initiated: 07/14/2022 Target Resolution Date: 08/25/2022 Goal Status: Active Interventions: CHANSON, TEEMS ([id number]) [id number] Page 5  of 8 Assess patient/caregiver ability to obtain necessary supplies Assess patient/caregiver ability to perform ulcer/skin care regimen upon admission and as needed Assess ulceration(s) every visit Provide education on ulcer and skin care Treatment Activities: Skin care regimen initiated : 06/14/2022 Topical wound management initiated : 06/14/2022 Notes: Electronic Signature(s) Signed: 08/04/2022 3:57:54 PM By: Adline Peals Entered By: Adline Peals on 08/04/2022  11:10:22 -------------------------------------------------------------------------------- Pain Assessment Details Patient Name: Date of Service: Zachary Alto E. 08/04/2022 10:45 A M Medical Record Number: [id number] Patient Account Number: [id number] Date of Birth/Sex: Treating RN: 21-Nov-1940 (81 y.o. Janyth Contes Primary Care Vonzella Althaus: [name] Other Clinician: Referring Demba Nigh: Treating Journiee Feldkamp/Extender: Lenox Ponds in Treatment: 7 Active Problems Location of Pain Severity and Description of Pain Patient Has Paino No Site Locations Rate the pain. Current Pain Level: 0 Pain Management and Medication Current Pain Management: Electronic Signature(s) Signed: 08/04/2022 3:57:54 PM By: Adline Peals Entered By: Adline Peals on 08/04/2022 10:54:58 -------------------------------------------------------------------------------- Patient/Caregiver Education Details Patient Name: Date of Service: Margarette Canada 11/10/2023andnbsp10:45 Delene Loll ([id number]) [id number] Page 6 of 8 Medical Record Number: [id number] Patient Account Number: [id number] Date of Birth/Gender: Treating RN: Jun 11, 1941 (81 y.o. Janyth Contes Primary Care Physician: [name] Other Clinician: Referring Physician: Treating Physician/Extender: Lenox Ponds in Treatment: 7 Education Assessment Education Provided To: Patient Education Topics Provided Wound/Skin Impairment: Methods: Explain/Verbal Responses: Reinforcements needed, State content correctly Electronic Signature(s) Signed: 08/04/2022 3:57:54 PM By: Adline Peals Entered By: Adline Peals on 08/04/2022 11:10:33 -------------------------------------------------------------------------------- Wound Assessment Details Patient Name: Date of Service: Zachary Alto E. 08/04/2022 10:45 A M Medical  Record Number: [id number] Patient Account Number: [id number] Date of Birth/Sex: Treating RN: May 18, 1941 (81 y.o. Janyth Contes Primary Care Emori Kamau: [name] Other Clinician: Referring Cecile Gillispie: Treating Lenae Wherley/Extender: Lenox Ponds in Treatment: 7 Wound Status Wound Number: 1 Primary Open Surgical Wound Etiology: Wound Location: Left, Lateral Lower Leg Wound Open Wounding Event: Surgical Injury Status: Date Acquired: 05/09/2022 Comorbid Cataracts, Arrhythmia, Deep Vein Thrombosis, Hypertension, Weeks Of Treatment: 7 History: Osteoarthritis, Neuropathy, Seizure Disorder Clustered Wound: Yes Photos Wound Measurements Length: (cm) Width: (cm) Depth: (cm) Clustered Quantity: Area: (cm) Volume: (cm) 2 % Reduction in Area: 89.2% 0.9 % Reduction in Volume: 98.7% 0.1 Epithelialization: Medium (34-66%) 3 Tunneling: No 1.414 Undermining: No 0.141 Wound Description Classification: Full Thickness With Exposed Support Structures Wound Margin: Distinct, outline attached Exudate Amount: Medium SAAFIR, ABDULLAH ([id number]) Exudate Type: Serosanguineous Exudate Color: red, brown Foul Odor After Cleansing: No Slough/Fibrino Yes [id number] Page 7 of 8 Wound Bed Granulation Amount: Large (67-100%) Exposed Structure Granulation Quality: Red, Pink Fascia Exposed: No Necrotic Amount: Small (1-33%) Fat Layer (Subcutaneous Tissue) Exposed: Yes Necrotic Quality: Adherent Slough Tendon Exposed: No Muscle Exposed: No Joint Exposed: No Bone Exposed: No Periwound Skin Texture Texture Color No Abnormalities Noted: No No Abnormalities Noted: No Scarring: Yes Erythema: Yes Moisture Temperature / Pain No Abnormalities Noted: Yes Temperature: No Abnormality Tenderness on Palpation: Yes Treatment Notes Wound #1 (Lower Leg) Wound Laterality: Left, Lateral Cleanser Peri-Wound Care Sween Lotion (Moisturizing  lotion) Discharge Instruction: Apply moisturizing lotion as directed Topical Primary Dressing Hydrofera Blue Ready Foam, 2.5 x2.5 in Discharge Instruction: Apply to wound bed as instructed Secondary Dressing ABD Pad, 5x9 Discharge Instruction: Apply over primary dressing as directed. Optifoam Non-Adhesive Dressing, 4x4 in Discharge Instruction: Apply to medial ankle bone to protect from irritation. Woven Gauze Sponge, Non-Sterile 4x4 in  Discharge Instruction: Apply over primary dressing as directed. Secured With Compression Wrap Kerlix Roll 4.5x3.1 (in/yd) Discharge Instruction: Apply Kerlix and Coban compression as directed. Coban Self-Adherent Wrap 4x5 (in/yd) Discharge Instruction: Apply over Kerlix as directed. Compression Stockings Add-Ons Electronic Signature(s) Signed: 08/04/2022 3:57:54 PM By: Adline Peals Entered By: Adline Peals on 08/04/2022 11:05:00 -------------------------------------------------------------------------------- Vitals Details Patient Name: Date of Service: Zachary Alto E. 08/04/2022 10:45 A M Medical Record Number: [id number] Patient Account Number: [id number] Date of Birth/Sex: Treating RN: [birth year] (81 y.o. Janyth Contes Primary Care Jerzy Crotteau: [name] Other Clinician: [name] ([id number]) [id number] Page 8 of 8 Referring Amerie Beaumont: Treating Eveline Sauve/Extender: Lenox Ponds in Treatment: 7 Vital Signs Time Taken: 10:57 Temperature (F): 98.3 Height (in): 72 Pulse (bpm): 60 Weight (lbs): 205 Respiratory Rate (breaths/min): 18 Body Mass Index (BMI): 27.8 Blood Pressure (mmHg): 185/84 Reference Range: 80 - 120 mg / dl Electronic Signature(s) Signed: 08/04/2022 3:57:54 PM By: Adline Peals Entered By: Adline Peals on 08/04/2022 10:59:08

## 2022-08-04 NOTE — Progress Notes (Signed)
[name] ([id number]) [id number] Page 1 of 9 Visit Report for 08/04/2022 Chief Complaint Document Details Patient Name: Date of Service: [name], [name] 08/04/2022 10:45 A M Medical Record Number: [id number] Patient Account Number: [id number] Date of Birth/Sex: Treating RN: 1941/08/31 (81 y.o. M) Primary Care Provider: Jerl Mina Other Clinician: Referring Provider: Treating Provider/Extender: Cinda Quest in Treatment: 7 Information Obtained from: Patient Chief Complaint Patient presents to the wound care center with open non-healing surgical wound(s) Electronic Signature(s) Signed: 08/04/2022 11:54:30 AM By: Duanne Guess MD FACS Entered By: Duanne Guess on 08/04/2022 11:54:29 -------------------------------------------------------------------------------- Debridement Details Patient Name: Date of Service: [name]. 08/04/2022 10:45 A M Medical Record Number: [id number] Patient Account Number: [id number] Date of Birth/Sex: Treating RN: 1941/08/11 (81 y.o. Marlan Palau Primary Care Provider: Jerl Mina Other Clinician: Referring Provider: Treating Provider/Extender: Cinda Quest in Treatment: 7 Debridement Performed for Assessment: Wound #1 Left,Lateral Lower Leg Performed By: Physician Duanne Guess, MD Debridement Type: Debridement Level of Consciousness (Pre-procedure): Awake and Alert Pre-procedure Verification/Time Out Yes - 11:10 Taken: Start Time: 11:10 Pain Control: Lidocaine 4% T opical Solution T Area Debrided (L x W): otal 2 (cm) x 0.9 (cm) = 1.8 (cm) Tissue and other material debrided: Non-Viable, Eschar, Slough, Slough Level: Non-Viable Tissue Debridement Description: Selective/Open Wound Instrument: Curette Bleeding: Minimum Hemostasis Achieved: Pressure Response to Treatment: Procedure was tolerated well Level of  Consciousness (Post- Awake and Alert procedure): Post Debridement Measurements of Total Wound Length: (cm) 2 Width: (cm) 0.9 Depth: (cm) 0.1 Volume: (cm) 0.141 Character of Wound/Ulcer Post Debridement: Improved Post Procedure Diagnosis Same as [name], [name] ([id number]) [id number] Page 2 of 9 Notes scribed for Dr. Lady Gary by Samuella Bruin, RN Electronic Signature(s) Signed: 08/04/2022 12:06:25 PM By: Duanne Guess MD FACS Signed: 08/04/2022 3:57:54 PM By: Gelene Mink By: Samuella Bruin on 08/04/2022 11:11:31 -------------------------------------------------------------------------------- HPI Details Patient Name: Date of Service: [name]. 08/04/2022 10:45 A M Medical Record Number: [id number] Patient Account Number: [id number] Date of Birth/Sex: Treating RN: 1941/03/31 (81 y.o. M) Primary Care Provider: Jerl Mina Other Clinician: Referring Provider: Treating Provider/Extender: Cinda Quest in Treatment: 7 History of Present Illness HPI Description: ADMISSION 06/14/2022 This is an 1 suffered a left bimalleolar ankle fracture in November 2022. He underwent ORIF of the fracture. His postoperative course was complicated by areas of wound dehiscence. On May 09, 2022, he was taken to the operating room by orthopedics again for hardware removal. Cultures taken from the operating room demonstrated methicillin sensitive Staph aureus. He is currently taking doxycycline. He is not diabetic, nor does he smoke. ABI in clinic today was 0.86; he is going to have formal ABIs performed this Friday at the vascular surgery clinic in [place]. There is a linear wound running down his lateral left ankle. There are interspersed bands of epithelium. The wound does probe to bone over the malleolus. There is some slough accumulation but also some good granulation tissue filling in the  wound bed. There is a single nylon suture remaining in place. 06/21/2022: The wound measured a little bit smaller today. There is good granulation tissue filling in from the depths of the wound. There is a small area where bone is still accessible with a probe. Minimal slough and biofilm accumulation. He has completed his course of oral antibiotics. He did have formal ABIs performed on Friday and they were normal bilaterally. 06/29/2022: The wound  continues to contract. The more proximal and distal extensions of the incision have nearly closed. The main open portion of the wound continues to fill with granulation tissue. I am still able to probe to bone in 1 small area, but overall there has been good improvement. 10/13; the wound continues to contract. Healthy granulation. The deepest parts of the distal wound are actually in the mid aspect. Smaller areas superiorly look superficial. 10/20; minimally smaller. Using silver alginate. 07/21/2022: The wound continues to contract. The bone is completely covered. Good granulation tissue. 07/28/2022: The wound has contracted further. There is a healthy bed of granulation tissue present. Just a little bit of light slough accumulation. 08/04/2022: The wound is smaller again today. Good granulation tissue present with just a little bit of slough and eschar. Electronic Signature(s) Signed: 08/04/2022 11:54:57 AM By: Duanne Guess MD FACS Entered By: Duanne Guess on 08/04/2022 11:54:56 -------------------------------------------------------------------------------- Physical Exam Details Patient Name: Date of Service: [name]. 08/04/2022 10:45 A M Medical Record Number: [id number] Patient Account Number: [id number] Date of Birth/Sex: Treating RN: [birth year] (81 y.o. M) Primary Care Provider: Jerl Mina Other Clinician: Referring Provider: Treating Provider/Extender: Cinda Quest in Treatment:  [address] [name], [name] ([id number]) [id number] Page 3 of 9 Hypertensive, asymptomatic. . . . No acute distress.Marland Kitchen Respiratory Normal work of breathing on room air.. Notes 08/04/2022: The wound is smaller again today. Good granulation tissue present with just a little bit of slough and eschar. Electronic Signature(s) Signed: 08/04/2022 11:55:58 AM By: Duanne Guess MD FACS Entered By: Duanne Guess on 08/04/2022 11:55:58 -------------------------------------------------------------------------------- Physician Orders Details Patient Name: Date of Service: [name]. 08/04/2022 10:45 A M Medical Record Number: [id number] Patient Account Number: [id number] Date of Birth/Sex: Treating RN: July 08, 1941 (81 y.o. 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 - Dr. Lady Gary RM 1 Return A Anesthetic Wound #1 Left,Lateral Lower Leg (In clinic) Topical Lidocaine 4% applied to wound bed - prior to [employer] May shower with protection but do not get wound dressing(s) wet. Edema Control - Lymphedema / SCD / Other Elevate legs to the level of the heart or above for 30 minutes daily and/or when sitting, a frequency of: - throughout the day Avoid standing for long periods of time. Exercise regularly Home Health Wound #1 Left,Lateral Lower Leg New wound care orders this week; continue Home Health for wound care. May utilize formulary  equivalent dressing for wound treatment orders unless otherwise specified. Dressing changes to be completed by Home Health on Monday / Wednesday / Friday except when patient has scheduled visit at [facility]. Other Home Health Orders/Instructions: - [facility] Wound Treatment Wound #1 - Lower Leg Wound Laterality: Left, Lateral Peri-Wound Care: Sween Lotion (Moisturizing lotion) 3 x Per Week/30 Days Discharge Instructions: Apply moisturizing lotion as directed Prim Dressing: Hydrofera Blue Ready Foam, 2.5 x2.5 in 3 x Per Week/30 Days ary Discharge Instructions: Apply to wound bed as instructed Secondary Dressing: ABD Pad, 5x9 (Generic) 3 x Per Week/30 Days Discharge Instructions: Apply over primary dressing as directed. Secondary Dressing: Optifoam Non-Adhesive Dressing, 4x4 in 3  x Per Week/30 Days Discharge Instructions: Apply to medial ankle bone to protect from irritation. [name], [name] ([id number]) [id number] Page 4 of 9 Secondary Dressing: Woven Gauze Sponge, Non-Sterile 4x4 in (Generic) 3 x Per Week/30 Days Discharge Instructions: Apply over primary dressing as directed. Compression Wrap: Kerlix Roll 4.5x3.1 (in/yd) (Generic) 3 x Per Week/30 Days Discharge Instructions: Apply Kerlix and Coban compression as directed. Compression Wrap: Coban Self-Adherent Wrap 4x5 (in/yd) (Generic) 3 x Per Week/30 Days Discharge Instructions: Apply over Kerlix as directed. Patient Medications llergies: codeine, fentanyl, regadenoson, verapamil, acetaminophen, Sulfa (Sulfonamide Antibiotics) A Notifications Medication Indication Start End 08/04/2022 lidocaine DOSE topical 4 % cream - cream topical Electronic Signature(s) Signed: 08/04/2022 12:06:25 PM By: Duanne Guess MD FACS Entered By: Duanne Guess on 08/04/2022 11:56:14 -------------------------------------------------------------------------------- Problem List Details Patient Name: Date of  Service: [name]. 08/04/2022 10:45 A M Medical Record Number: [id number] Patient Account Number: [id number] Date of Birth/Sex: Treating RN: 1941-05-08 (81 y.o. 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 without 06/14/2022 No Yes evidence of necrosis T81.31XS Disruption of external operation (surgical) wound, not elsewhere classified, 06/14/2022 No Yes sequela I10 Essential (primary) hypertension 06/14/2022 No Yes Inactive Problems Resolved Problems Electronic Signature(s) Signed: 08/04/2022 11:52:38 AM By: Duanne Guess MD FACS Entered By: Duanne Guess on 08/04/2022 11:52:38 [name] ([id number]) [id number] Page 5 of 9 -------------------------------------------------------------------------------- Progress Note Details Patient Name: Date of Service: [name], [name] 08/04/2022 10:45 A M Medical Record Number: [id number] Patient Account Number: [id number] Date of Birth/Sex: Treating RN: [birth year] (81 y.o. M) Primary Care Provider: Jerl Mina Other Clinician: Referring Provider: Treating Provider/Extender: Cinda Quest in Treatment: 7 Subjective Chief Complaint Information obtained from Patient Patient presents to the wound care center with open non-healing surgical wound(s) History of Present Illness (HPI) ADMISSION 06/14/2022 This is an 81 suffered a left bimalleolar ankle fracture in November 2022. He underwent ORIF of the fracture. His postoperative course was complicated by areas of wound dehiscence. On May 09, 2022, he was taken to the operating room by orthopedics again for hardware removal. Cultures taken from  the operating room demonstrated methicillin sensitive Staph aureus. He is currently taking doxycycline. He is not diabetic, nor does he smoke. ABI in clinic today was 0.86; he is going to have formal ABIs performed this Friday at the vascular surgery clinic in [place]. There is a linear wound running down his lateral left ankle. There are interspersed bands of epithelium. The wound does probe to bone over the malleolus. There is some slough accumulation but also some good granulation tissue filling in the wound bed. There is a single nylon suture remaining in place. 06/21/2022: The wound measured a little bit smaller today. There is good granulation tissue filling in from the depths of the wound. There is a small area where bone is still accessible with a probe. Minimal slough and biofilm accumulation. He has completed his course of oral antibiotics. He did have formal ABIs performed on Friday and they were normal bilaterally. 06/29/2022: The wound continues to contract. The more proximal and distal extensions of the incision have nearly closed. The main open portion of the wound continues to fill with granulation tissue. I am still able to probe to bone in 1 small area, but overall there  has been good improvement. 10/13; the wound continues to contract. Healthy granulation. The deepest parts of the distal wound are actually in the mid aspect. Smaller areas superiorly look superficial. 10/20; minimally smaller. Using silver alginate. 07/21/2022: The wound continues to contract. The bone is completely covered. Good granulation tissue. 07/28/2022: The wound has contracted further. There is a healthy bed of granulation tissue present. Just a little bit of light slough accumulation. 08/04/2022: The wound is smaller again today. Good granulation tissue present with just a little bit of slough and eschar. Patient History Information obtained from Patient, Chart. Family History Heart Disease - Mother,  Hypertension - Father, No family history of Cancer, Diabetes, Hereditary Spherocytosis, Kidney Disease, Lung Disease, Seizures, Stroke, Thyroid Problems, Tuberculosis. Social History Never smoker, Marital Status - Married, Alcohol Use - Never, Drug Use - No History, Caffeine Use - Daily - coffee. Medical History Eyes Patient has history of Cataracts - right eye extraction Denies history of Glaucoma, Optic Neuritis Cardiovascular Patient has history of Arrhythmia - afib, Deep Vein Thrombosis - left leg, Hypertension Endocrine Denies history of Type I Diabetes, Type II Diabetes Genitourinary Denies history of End Stage Renal Disease Integumentary (Skin) Denies history of History of Burn Musculoskeletal Patient has history of Osteoarthritis Denies history of Osteomyelitis Neurologic Patient has history of Neuropathy, Seizure Disorder - hx Oncologic Denies history of Received Chemotherapy, Received Radiation Psychiatric Denies history of Anorexia/bulimia, Confinement Anxiety Medical A Surgical History Notes nd Cardiovascular hyperlipidemia Gastrointestinal Zachary DeforestVUNCANNON, [name] ([id number]) [id number] Page 6 of 9 hx GI bleed Genitourinary hx pyelonephritis Neurologic hx cerebral hemorrhage Objective Constitutional Hypertensive, asymptomatic. No acute distress.. Vitals Time Taken: 10:57 AM, Height: 72 in, Weight: 205 lbs, BMI: 27.8, Temperature: 98.3 F, Pulse: 60 bpm, Respiratory Rate: 18 breaths/min, Blood Pressure: 185/84 mmHg. Respiratory Normal work of breathing on room air.. General Notes: 08/04/2022: The wound is smaller again today. Good granulation tissue present with just a little bit of slough and eschar. Integumentary (Hair, Skin) Wound #1 status is Open. Original cause of wound was Surgical Injury. The date acquired was: 05/09/2022. The wound has been in treatment 7 weeks. The wound is located on the Left,Lateral Lower Leg. The wound  measures 2cm length x 0.9cm width x 0.1cm depth; 1.414cm^2 area and 0.141cm^3 volume. There is Fat Layer (Subcutaneous Tissue) exposed. There is no tunneling or undermining noted. There is a medium amount of serosanguineous drainage noted. The wound margin is distinct with the outline attached to the wound base. There is large (67-100%) red, pink granulation within the wound bed. There is a small (1- 33%) amount of necrotic tissue within the wound bed including Adherent Slough. The periwound skin appearance had no abnormalities noted for moisture. The periwound skin appearance exhibited: Scarring, Erythema. The surrounding wound skin color is noted with erythema. Periwound temperature was noted as No Abnormality. The periwound has tenderness on palpation. 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 There was a Selective/Open Wound Non-Viable Tissue Debridement with a total area of 1.8 sq cm performed by Duanne Guessannon, Quentin Strebel, MD. With the following instrument(s): Curette to remove Non-Viable tissue/material. Material removed includes Eschar and Slough and after achieving pain control using Lidocaine 4% Topical Solution. No specimens were taken. A time out was conducted at 11:10,  prior to the start of the procedure. A Minimum amount of bleeding was controlled with Pressure. The procedure was tolerated well. Post Debridement Measurements: 2cm length x 0.9cm width x 0.1cm depth; 0.141cm^3 volume. Character of Wound/Ulcer Post Debridement is improved. Post procedure Diagnosis Wound #1: Same as Pre-Procedure General Notes: scribed for Dr. Lady Gary by Samuella Bruin, RN. Plan Follow-up  Appointments: Return Appointment in 1 week. - Dr. Lady Gary RM 1 Anesthetic: Wound #1 Left,Lateral Lower Leg: (In clinic) Topical Lidocaine 4% applied to wound bed - prior to debridemnet Bathing/ Shower/ Hygiene: May shower with protection but do not get wound dressing(s) wet. Edema Control - Lymphedema / SCD / Other: Elevate legs to the level of the heart or above for 30 minutes daily and/or when sitting, a frequency of: - throughout the day Avoid standing for long periods of time. Exercise regularly Home Health: [name], [name] ([id number]) [id number] Page 7 of 9 Wound #1 Left,Lateral Lower Leg: New wound care orders this week; continue Home Health for wound care. May utilize formulary equivalent dressing for wound treatment orders unless otherwise specified. Dressing changes to be completed by Home Health on Monday / Wednesday / Friday except when patient has scheduled visit at [facility] Other Home Health Orders/Instructions: - [facility] The following medication(s) was prescribed: lidocaine topical 4 % cream cream topical was prescribed at facility WOUND #1: - Lower Leg Wound Laterality: Left, Lateral Peri-Wound Care: Sween Lotion (Moisturizing lotion) 3 x Per Week/30 Days Discharge Instructions: Apply moisturizing lotion as directed Prim Dressing: Hydrofera Blue Ready Foam, 2.5 x2.5 in 3 x Per Week/30 Days ary Discharge Instructions: Apply to wound bed as instructed Secondary Dressing: ABD Pad, 5x9 (Generic) 3 x Per Week/30 Days Discharge Instructions: Apply over primary dressing as directed. Secondary Dressing: Optifoam Non-Adhesive Dressing, 4x4 in 3 x Per Week/30 Days Discharge Instructions: Apply to medial ankle bone to protect from irritation. Secondary Dressing: Woven Gauze Sponge, Non-Sterile 4x4 in (Generic) 3 x Per Week/30 Days Discharge Instructions: Apply over primary dressing as directed. Com pression Wrap: Kerlix Roll 4.5x3.1  (in/yd) (Generic) 3 x Per Week/30 Days Discharge Instructions: Apply Kerlix and Coban compression as directed. Com pression Wrap: Coban Self-Adherent Wrap 4x5 (in/yd) (Generic) 3 x Per Week/30 Days Discharge Instructions: Apply over Kerlix as directed. 08/04/2022: The wound is smaller again today. Good granulation tissue present with just a little bit of slough and eschar. I used a curette to debride slough and eschar from the wound. The granulation tissue is starting to look a little bit hypertrophic so going to change his dressing to Hydrofera Blue. Continue Kerlix and Coban wrapping. Follow-up in 1 week. Electronic Signature(s) Signed: 08/04/2022 11:56:56 AM By: Duanne Guess MD FACS Entered By: Duanne Guess on 08/04/2022 11:56:56 -------------------------------------------------------------------------------- HxROS Details Patient Name: Date of Service: [name]. 08/04/2022 10:45 A M Medical Record Number: [id number] Patient Account Number: [id number] Date of Birth/Sex: Treating RN: [birth year] (81 y.o. M) Primary Care Provider: Jerl Mina Other Clinician: Referring Provider: Treating Provider/Extender: Cinda Quest in Treatment: 7 Information Obtained From Patient Chart Eyes Medical History: Positive for: Cataracts - right eye extraction Negative for: Glaucoma; Optic Neuritis Cardiovascular Medical History: Positive for: Arrhythmia - afib; Deep Vein Thrombosis - left leg; Hypertension Past Medical History Notes: hyperlipidemia Gastrointestinal Medical History: Past Medical History Notes: hx GI bleed Endocrine Medical History: Negative for: Type I Diabetes; Type II Diabetes [name], [name] ([id number]) [id number] Page 8 of 9 Genitourinary Medical History: Negative for: End  Stage Renal Disease Past Medical History Notes: hx pyelonephritis Integumentary (Skin) Medical History: Negative for:  History of Burn Musculoskeletal Medical History: Positive for: Osteoarthritis Negative for: Osteomyelitis Neurologic Medical History: Positive for: Neuropathy; Seizure Disorder - hx Past Medical History Notes: hx cerebral hemorrhage Oncologic Medical History: Negative for: Received Chemotherapy; Received Radiation Psychiatric Medical History: Negative for: Anorexia/bulimia; Confinement Anxiety HBO Extended History Items Eyes: Cataracts Immunizations Pneumococcal Vaccine: Received Pneumococcal Vaccination: Yes Received Pneumococcal Vaccination On or After 60th Birthday: Yes Implantable Devices None Family and Social History Cancer: No; Diabetes: No; Heart Disease: Yes - Mother; Hereditary Spherocytosis: No; Hypertension: Yes - Father; Kidney Disease: No; Lung Disease: No; Seizures: No; Stroke: No; Thyroid Problems: No; Tuberculosis: No; Never smoker; Marital Status - Married; Alcohol Use: Never; Drug Use: No History; Caffeine Use: Daily - coffee; Financial Concerns: No; Food, Clothing or Shelter Needs: No; Support System Lacking: No; Transportation Concerns: Yes - caregiver provides rides in am Electronic Signature(s) Signed: 08/04/2022 12:06:25 PM By: Duanne Guess MD FACS Entered By: Duanne Guess on 08/04/2022 11:55:03 -------------------------------------------------------------------------------- SuperBill Details Patient Name: Date of Service: [name]. 08/04/2022 Medical Record Number: [id number] Patient Account Number: [id number] Date of Birth/Sex: Treating RN: 05/18/1941 (81 y.o. 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 without  evidence of necrosis T81.31XS Disruption of external operation (surgical) wound, not elsewhere classified, sequela I10 Essential (primary) hypertension Facility Procedures : CPT4 Code: [id number] Description: 97597 - DEBRIDE WOUND 1ST 20 SQ CM OR < ICD-10 Diagnosis Description 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 without evidence Modifier: of necrosis Quantity: 1 Physician Procedures : CPT4 Code Description Modifier [id number] - WC PHYS LEVEL 3 - EST PT 25 ICD-10 Diagnosis Description 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

## 2022-08-11 ENCOUNTER — Encounter (INDEPENDENT_AMBULATORY_CARE_PROVIDER_SITE_OTHER): Payer: Self-pay | Admitting: Vascular Surgery

## 2022-08-11 ENCOUNTER — Encounter (HOSPITAL_BASED_OUTPATIENT_CLINIC_OR_DEPARTMENT_OTHER): Payer: Medicare Other | Admitting: General Surgery

## 2022-08-11 ENCOUNTER — Ambulatory Visit (INDEPENDENT_AMBULATORY_CARE_PROVIDER_SITE_OTHER): Payer: Medicare Other | Admitting: Vascular Surgery

## 2022-08-11 VITALS — BP 170/85 | Resp 19

## 2022-08-11 DIAGNOSIS — T8131XS Disruption of external operation (surgical) wound, not elsewhere classified, sequela: Secondary | ICD-10-CM | POA: Diagnosis not present

## 2022-08-11 DIAGNOSIS — I10 Essential (primary) hypertension: Secondary | ICD-10-CM

## 2022-08-11 DIAGNOSIS — I7025 Atherosclerosis of native arteries of other extremities with ulceration: Secondary | ICD-10-CM | POA: Diagnosis not present

## 2022-08-11 DIAGNOSIS — L97326 Non-pressure chronic ulcer of left ankle with bone involvement without evidence of necrosis: Secondary | ICD-10-CM | POA: Diagnosis not present

## 2022-08-11 NOTE — Assessment & Plan Note (Signed)
His wound is healing and his recent evaluation of perfusion showed it to be reasonably good and adequate for wound healing.  At this point, no further intervention is planned.  Recheck in 6 months.

## 2022-08-11 NOTE — Progress Notes (Signed)
MRN : [id number]  [name] is a 81 y.o. (Dec 04, 1940) male who presents with chief complaint of No chief complaint on file. Marland Kitchen  History of Present Illness: Patient returns today in follow up of his left lower extremity wound and PAD.  Since his last visit about 2 months ago, his wound has done quite well.  It is continuing to improve.  The wound care center has done an excellent job of taking care of it.  They told him to expect this to be healed within 2 weeks.  No fevers or chills.  No signs of systemic infection.  Current Outpatient Medications  Medication Sig Dispense Refill   acetaminophen (TYLENOL) 325 MG tablet Take 1-2 tablets (325-650 mg total) by mouth every 6 (six) hours as needed for mild pain (pain score 1-3 or temp > 100.5).     aliskiren (TEKTURNA) 300 MG tablet Take 300 mg by mouth daily.      apixaban (ELIQUIS) 2.5 MG TABS tablet Take 2.5 mg by mouth 2 (two) times daily.     carbamazepine (TEGRETOL) 200 MG tablet Take 200 mg by mouth 2 (two) times a day.      doxazosin (CARDURA) 1 MG tablet Take 1 mg by mouth 2 (two) times daily.     furosemide (LASIX) 20 MG tablet Take 20 mg by mouth 2 (two) times daily.     irbesartan (AVAPRO) 300 MG tablet Take 300 mg by mouth daily.     pantoprazole (PROTONIX) 40 MG tablet Take 40 mg by mouth at bedtime.      simvastatin (ZOCOR) 20 MG tablet Take 20 mg by mouth 2 (two) times daily.      No current facility-administered medications for this visit.    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 Jerl Mina, [facility] in [place]   Intracranial bleed [facility])    december 2010   Left leg DVT (HCC) 09/2019   Migraines    "2 in my life" (09/17/2012)   Mitral regurgitation    a. 01/2019 Echo: EF 55-60%, mild conc LVH. RVSP 59.31mmHg. Mildly dil LA. Mild to mod MR. Mild AI.   PAH (pulmonary artery hypertension) (HCC)    Permanent atrial fibrillation (HCC)    a. CHA2DS2VASc = 5-->No OAC 2/2 h/o SAH; b. 03/2019 Zio: permanent Afib - 78 bpm (46-135). Occas PVCs (1.4% burden).   Retina disorder    hx of torn retina   Stroke (HCC) 2002   LLE "just a little bit weaker" (09/17/2012)   Subarachnoid hemorrhage (HCC) 08/2009    Past Surgical History:  Procedure Laterality Date   BLEPHAROPLASTY  1990's   "right eye" (09/16/2012)   CARDIOVASCULAR STRESS TEST     nl perfusion w/o ischemia, EF 56%, bradycardia without syncope 04/15/10 (Dr. Harold Hedge)   CATARACT EXTRACTION W/ INTRAOCULAR LENS IMPLANT  1980's   "right" (09/17/2012)   COLONOSCOPY  06/26/09   EYE MUSCLE SURGERY  1980's   "right eye" (09/17/2012)   HARDWARE REMOVAL Left 05/09/2022   Procedure: HARDWARE REMOVAL LEFT ANKLE;  Surgeon: Juanell Fairly, MD;  Location: [facility] ORS;  Service: Orthopedics;  Laterality: Left;   INGUINAL HERNIA REPAIR  04/10/2011   "right" (09/17/2012)   JOINT REPLACEMENT     ORIF ANKLE FRACTURE Left 08/19/2021   Procedure: OPEN REDUCTION INTERNAL FIXATION (ORIF) ANKLE FRACTURE;  Surgeon: Juanell Fairly, MD;  Location: [facility] ORS;  Service: Orthopedics;  Laterality: Left;   PERIPHERAL VASCULAR THROMBECTOMY Left 10/27/2019   Procedure: PERIPHERAL VASCULAR THROMBECTOMY;  Surgeon: Annice Needy, MD;  Location: [facility] INVASIVE CV LAB;  Service: Cardiovascular;  Laterality: Left;   POSTERIOR LUMBAR FUSION  09/16/2012   "L4-5" (09/17/2012)   RETINAL DETACHMENT SURGERY  1980's   "right" (09/17/2012)   TOTAL KNEE ARTHROPLASTY  04/28/2010   "left"  (09/17/2012)     Social History   Tobacco Use   Smoking status: Never   Smokeless tobacco: Never  Vaping Use   Vaping Use: Never used  Substance Use Topics   Alcohol use: No   Drug use: No       Family History  Problem Relation Age of Onset   Heart disease Mother      Allergies  Allergen Reactions   Codeine Nausea Only and Other (See Comments)   Fentanyl Other (See Comments)    Given before knee surgery, blood pressure became extremely low   Regadenoson Other (See Comments)    Created cardiac arrest   Sulfamethoxazole-Trimethoprim Hives   Verapamil Other (See Comments)    Per patient: Medication has caused low heart rate and caused patient to have afib that he currently has   Acetaminophen Other (See Comments)    Pt states he feels hot when he takes acetaminophen     REVIEW OF SYSTEMS (Negative unless checked)  Constitutional: [] Weight loss  [] Fever  [] Chills Cardiac: [] Chest pain   [] Chest pressure   [x] Palpitations   [] Shortness of breath when laying flat   [] Shortness of breath at rest   [x] Shortness of breath with exertion. Vascular:  [] Pain in legs with walking   [] Pain in legs at rest   [] Pain in legs when laying flat   [] Claudication   [] Pain in feet when walking  [] Pain in feet at rest  [] Pain in feet when laying flat   [] History of DVT   [] Phlebitis   [x] Swelling in legs   [] Varicose veins   [x] Non-healing ulcers Pulmonary:   [] Uses home oxygen   [] Productive cough   [] Hemoptysis   [] Wheeze  [] COPD   [] Asthma Neurologic:  [] Dizziness  [] Blackouts   [] Seizures   [] History of stroke   [] History of TIA  [] Aphasia   [] Temporary blindness   [] Dysphagia   [] Weakness or numbness in arms   [] Weakness or numbness in legs Musculoskeletal:  [] Arthritis   [] Joint swelling   [] Joint pain   [] Low back pain Hematologic:  [] Easy bruising  [] Easy bleeding   [] Hypercoagulable state   [] Anemic   Gastrointestinal:  [] Blood in stool   [] Vomiting blood  [] Gastroesophageal  reflux/heartburn   [] Abdominal pain Genitourinary:  [] Chronic kidney disease   [] Difficult urination  [] Frequent urination  [] Burning with urination   [] Hematuria Skin:  [] Rashes   [x] Ulcers   [x] Wounds Psychological:  [] History of anxiety   []  History of major depression.  Physical Examination  BP (!) 170/85 (BP Location: Left Arm)   Resp 19  Gen:  WD/WN,  NAD Head: Christmas/AT, No temporalis wasting. Ear/Nose/Throat: Hearing grossly intact, nares w/o erythema or drainage Eyes: Conjunctiva clear. Sclera non-icteric Neck: Supple.  Trachea midline Pulmonary:  Good air movement, no use of accessory muscles.  Cardiac: Irregular Vascular:  Vessel Right Left  Radial Palpable Palpable               Musculoskeletal: M/S 5/5 throughout.  No deformity or atrophy.  Left lower extremity wound is currently dressed.  Mild left lower extremity edema.  In a wheelchair. Neurologic: Sensation grossly intact in extremities.  Symmetrical.  Speech is fluent.  Psychiatric: Judgment intact, Mood & affect appropriate for pt's clinical situation. Dermatologic: Left lower extremity wound is currently dressed.      Labs No results found for this or any previous visit (from the past 2160 hour(s)).  Radiology No results found.  Assessment/Plan  Atherosclerosis of native arteries of the extremities with ulceration (HCC) His wound is healing and his recent evaluation of perfusion showed it to be reasonably good and adequate for wound healing.  At this point, no further intervention is planned.  Recheck in 6 months.  Hypertension blood pressure control important in reducing the progression of atherosclerotic disease. On appropriate oral medications.    Festus Barren, MD  08/11/2022 2:43 PM    This note was created with Dragon medical transcription system.  Any errors from dictation are purely unintentional

## 2022-08-11 NOTE — Assessment & Plan Note (Signed)
blood pressure control important in reducing the progression of atherosclerotic disease. On appropriate oral medications.  

## 2022-08-12 NOTE — Progress Notes (Signed)
LUM, STILLINGER ([id number]) [id number] Page 1 of 7 Visit Report for 08/11/2022 Arrival Information Details Patient Name: Date of Service: [name], [name] 08/11/2022 12:30 PM Medical Record Number: [id number] Patient Account Number: [id number] Date of Birth/Sex: Treating RN: 1940/11/20 (81 y.o. [name] Tynan Boesel: [name] Other Clinician: Referring Lazara Grieser: Treating Dallas Torok/Extender: Lenox Ponds in Treatment: 8 Visit Information History Since Last Visit Added or deleted any medications: No Patient Arrived: Wheel Chair Any new allergies or adverse reactions: No Arrival Time: 12:45 Had a fall or experienced change in No Accompanied By: spouse, caregiver activities of daily living that may affect Transfer Assistance: None risk of falls: Patient Identification Verified: Yes Signs or symptoms of abuse/neglect since last visito No Secondary Verification Process Completed: Yes Hospitalized since last visit: No Patient Requires Transmission-Based Precautions: No Implantable device outside of the clinic excluding No Patient Has Alerts: Yes cellular tissue based products placed in the center Patient Alerts: Patient on Blood Thinner since last visit: Has Dressing in Place as Prescribed: Yes Has Compression in Place as Prescribed: Yes Pain Present Now: No Electronic Signature(s) Signed: 08/11/2022 5:40:17 PM By: Baruch Gouty RN, BSN Entered By: Baruch Gouty on 08/11/2022 12:47:36 -------------------------------------------------------------------------------- Encounter Discharge Information Details Patient Name: Date of Service: Zachary Alto E. 08/11/2022 12:30 PM Medical Record Number: [id number] Patient Account Number: [id number] Date of Birth/Sex: Treating RN: 1940-10-27 (81 y.o. [name] Jahan Friedlander: [name] Other Clinician: Referring  Candace Ramus: Treating Otilio Groleau/Extender: Lenox Ponds in Treatment: 8 Encounter Discharge Information Items Discharge Condition: Stable Ambulatory Status: Wheelchair Discharge Destination: Home Transportation: Private Auto Accompanied By: spouse and caregiver Schedule Follow-up Appointment: Yes Clinical Summary of Neal: Patient Declined Electronic Signature(s) Signed: 08/11/2022 5:40:17 PM By: Baruch Gouty RN, BSN Entered By: Baruch Gouty on 08/11/2022 13:22:23 [name] ([id number]) [id number] Page 2 of 7 -------------------------------------------------------------------------------- Lower Extremity Assessment Details Patient Name: Date of Service: [name], [name] 08/11/2022 12:30 PM Medical Record Number: [id number] Patient Account Number: [id number] Date of Birth/Sex: Treating RN: 01-03-1941 (81 y.o. [name] Shelanda Duvall: [name] Other Clinician: Referring Kanyah Matsushima: Treating Levi Klaiber/Extender: Lenox Ponds in Treatment: 8 Edema Assessment Assessed: [Left: No] [Right: No] Edema: [Left: Ye] [Right: s] Calf Left: Right: Point of Measurement: From Medial Instep 30 cm Ankle Left: Right: Point of Measurement: From Medial Instep 22.8 cm Vascular Assessment Pulses: Dorsalis Pedis Palpable: [Left:Yes] Electronic Signature(s) Signed: 08/11/2022 5:40:17 PM By: Baruch Gouty RN, BSN Entered By: Baruch Gouty on 08/11/2022 12:53:59 -------------------------------------------------------------------------------- Multi Wound Chart Details Patient Name: Date of Service: Zachary Alto E. 08/11/2022 12:30 PM Medical Record Number: [id number] Patient Account Number: [id number] Date of Birth/Sex: Treating RN: [birth year] (81 y.o. M) Primary Neal Keshun Berrett: [name] Other Clinician: Referring Andra Heslin: Treating Azariyah Luhrs/Extender: Lenox Ponds in Treatment: 8 Vital Signs Height(in): 72 Pulse(bpm): 22 Weight(lbs): 205 Blood Pressure(mmHg): 164/84 Body Mass Index(BMI): 27.8 Temperature(F): 98 Respiratory Rate(breaths/min): 18 [1:Photos:] [N/A:N/A] Left, Lateral Lower Leg N/A N/A Wound Location: Surgical Injury N/A N/A Wounding Event: Open Surgical Wound N/A N/A Primary Etiology: Cataracts, Arrhythmia, Deep Vein N/A N/A Comorbid History: Thrombosis, Hypertension, Osteoarthritis, Neuropathy, Seizure Disorder 05/09/2022 N/A N/A Date Acquired: 8 N/A N/A Weeks of Treatment: Open N/A N/A Wound Status: No N/A N/A Wound Recurrence: Yes N/A N/A Clustered Wound: 3 N/A N/A Clustered Quantity: 1.6x0.7x0.2 N/A N/A Measurements L x W x D (cm) 0.88 N/A  N/A A (cm) : rea 0.176 N/A N/A Volume (cm) : 93.30% N/A N/A % Reduction in Area: 98.30% N/A N/A % Reduction in Volume: Full Thickness With Exposed Support N/A N/A Classification: Structures Medium N/A N/A Exudate Amount: Serosanguineous N/A N/A Exudate Type: red, brown N/A N/A Exudate Color: Distinct, outline attached N/A N/A Wound Margin: Large (67-100%) N/A N/A Granulation Amount: Red, Hyper-granulation N/A N/A Granulation Quality: None Present (0%) N/A N/A Necrotic Amount: Fat Layer (Subcutaneous Tissue): Yes N/A N/A Exposed Structures: Fascia: No Tendon: No Muscle: No Joint: No Bone: No Small (1-33%) N/A N/A Epithelialization: Scarring: Yes N/A N/A Periwound Skin Texture: No Abnormalities Noted N/A N/A Periwound Skin Moisture: Erythema: Yes N/A N/A Periwound Skin Color: No Abnormality N/A N/A Temperature: Yes N/A N/A Tenderness on Palpation: Treatment Notes Electronic Signature(s) Signed: 08/11/2022 1:10:53 PM By: Fredirick Maudlin MD FACS Entered By: Fredirick Maudlin on 08/11/2022 13:10:52 -------------------------------------------------------------------------------- Multi-Disciplinary Neal Plan  Details Patient Name: Date of Service: Zachary Alto E. 08/11/2022 12:30 PM Medical Record Number: [id number] Patient Account Number: [id number] Date of Birth/Sex: Treating RN: 02-15-1941 (81 y.o. [name] Dao Mearns: [name] Other Clinician: Referring Rebecca Cairns: Treating Gayla Benn/Extender: Lenox Ponds in Treatment: [name] reviewed with physician Active Inactive Venous Leg Ulcer Nursing Diagnoses: Potential for venous Insuffiency (use before diagnosis confirmed) Goals: Patient will maintain optimal edema control Date Initiated: 06/29/2022 Target Resolution Date: 09/22/2022 Goal Status: Active [name], [name] ([id number]) [phone].pdf Page 4 of 7 Interventions: Assess peripheral edema status every visit. Compression as ordered Treatment Activities: T ordered outside of clinic : 06/29/2022 est Therapeutic compression applied : 06/29/2022 Notes: Wound/Skin Impairment Nursing Diagnoses: Impaired tissue integrity Knowledge deficit related to ulceration/compromised skin integrity Goals: Patient/caregiver will verbalize understanding of skin Neal regimen Date Initiated: 06/14/2022 Date Inactivated: 08/04/2022 Target Resolution Date: 08/11/2022 Goal Status: Met Ulcer/skin breakdown will have a volume reduction of 30% by week 4 Date Initiated: 06/14/2022 Date Inactivated: 07/14/2022 Target Resolution Date: 07/12/2022 Goal Status: Met Ulcer/skin breakdown will have a volume reduction of 50% by week 8 Date Initiated: 07/14/2022 Target Resolution Date: 08/25/2022 Goal Status: Active Interventions: Assess patient/caregiver ability to obtain necessary supplies Assess patient/caregiver ability to perform ulcer/skin Neal regimen upon admission and as needed Assess ulceration(s) every visit Provide education on ulcer and skin Neal Treatment Activities: Skin Neal regimen initiated  : 06/14/2022 Topical wound management initiated : 06/14/2022 Notes: Electronic Signature(s) Signed: 08/11/2022 5:40:17 PM By: Baruch Gouty RN, BSN Entered By: Baruch Gouty on 08/11/2022 13:00:28 -------------------------------------------------------------------------------- Pain Assessment Details Patient Name: Date of Service: Zachary Alto E. 08/11/2022 12:30 PM Medical Record Number: [id number] Patient Account Number: [id number] Date of Birth/Sex: Treating RN: [birth year] (81 y.o. [name] Ferman Basilio: [name] Other Clinician: Referring Halvor Behrend: Treating Oren Barella/Extender: Lenox Ponds in Treatment: 8 Active Problems Location of Pain Severity and Description of Pain Patient Has Paino No Site Locations Rate the pain. [name], [name] ([id number]) [id number] Page 5 of 7 Rate the pain. Current Pain Level: 0 Pain Management and Medication Current Pain Management: Electronic Signature(s) Signed: 08/11/2022 5:40:17 PM By: Baruch Gouty RN, BSN Entered By: Baruch Gouty on 08/11/2022 12:53:34 -------------------------------------------------------------------------------- Patient/Caregiver Education Details Patient Name: Date of Service: [name] 11/17/2023andnbsp12:30 PM Medical Record Number: [id number] Patient Account Number: [id number] Date of Birth/Gender: Treating RN: Jun 17, 1941 (81 y.o. [name] Physician: [name] Other Clinician: Referring Physician: Treating Physician/Extender: Nelwyn Salisbury  Weeks in Treatment: 8 Education Assessment Education Provided To: Patient Education Topics Provided Venous: Methods: Explain/Verbal Responses: Reinforcements needed, State content correctly Wound/Skin Impairment: Methods: Explain/Verbal Responses: Reinforcements needed, State content correctly Electronic  Signature(s) Signed: 08/11/2022 5:40:17 PM By: Baruch Gouty RN, BSN Entered By: Baruch Gouty on 08/11/2022 13:01:16 -------------------------------------------------------------------------------- Wound Assessment Details Patient Name: Date of Service: Zachary Alto E. 08/11/2022 12:30 PM [name] ([id number]) [id number] Page 6 of 7 Medical Record Number: [id number] Patient Account Number: [id number] Date of Birth/Sex: Treating RN: Jan 03, 1941 (81 y.o. [name] Acie Custis: [name] Other Clinician: Referring Jadine Brumley: Treating Brande Uncapher/Extender: Lenox Ponds in Treatment: 8 Wound Status Wound Number: 1 Primary Open Surgical Wound Etiology: Wound Location: Left, Lateral Lower Leg Wound Open Wounding Event: Surgical Injury Status: Date Acquired: 05/09/2022 Comorbid Cataracts, Arrhythmia, Deep Vein Thrombosis, Hypertension, Weeks Of Treatment: 8 History: Osteoarthritis, Neuropathy, Seizure Disorder Clustered Wound: Yes Photos Wound Measurements Length: (cm) Width: (cm) Depth: (cm) Clustered Quantity: Area: (cm) Volume: (cm) 1.6 % Reduction in Area: 93.3% 0.7 % Reduction in Volume: 98.3% 0.2 Epithelialization: Small (1-33%) 3 Tunneling: No 0.88 Undermining: No 0.176 Wound Description Classification: Full Thickness With Exposed Suppo Wound Margin: Distinct, outline attached Exudate Amount: Medium Exudate Type: Serosanguineous Exudate Color: red, brown rt Structures Foul Odor After Cleansing: No Slough/Fibrino Yes Wound Bed Granulation Amount: Large (67-100%) Exposed Structure Granulation Quality: Red, Hyper-granulation Fascia Exposed: No Necrotic Amount: None Present (0%) Fat Layer (Subcutaneous Tissue) Exposed: Yes Tendon Exposed: No Muscle Exposed: No Joint Exposed: No Bone Exposed: No Periwound Skin Texture Texture Color No Abnormalities Noted: No No  Abnormalities Noted: No Scarring: Yes Erythema: Yes Moisture Temperature / Pain No Abnormalities Noted: Yes Temperature: No Abnormality Tenderness on Palpation: Yes Treatment Notes Wound #1 (Lower Leg) Wound Laterality: Left, Lateral Cleanser Peri-Wound Neal Sween Lotion (Moisturizing lotion) Discharge Instruction: Apply moisturizing lotion as directed Topical Primary Dressing Hydrofera Blue Ready Foam, 2.5 x2.5 in [place] ([id number]) [id number] Page 7 of 7 Discharge Instruction: Apply to wound bed as instructed Secondary Dressing Optifoam Non-Adhesive Dressing, 4x4 in Discharge Instruction: Apply to medial ankle bone to protect from irritation. Woven Gauze Sponge, Non-Sterile 4x4 in Discharge Instruction: Apply over primary dressing as directed. Secured With Compression Wrap Kerlix Roll 4.5x3.1 (in/yd) Discharge Instruction: Apply Kerlix and Coban compression as directed. Coban Self-Adherent Wrap 4x5 (in/yd) Discharge Instruction: Apply over Kerlix as directed. Compression Stockings Add-Ons Electronic Signature(s) Signed: 08/11/2022 5:40:17 PM By: Baruch Gouty RN, BSN Entered By: Baruch Gouty on 08/11/2022 13:02:53 -------------------------------------------------------------------------------- Vitals Details Patient Name: Date of Service: Zachary Alto E. 08/11/2022 12:30 PM Medical Record Number: [id number] Patient Account Number: [id number] Date of Birth/Sex: Treating RN: [birth year] (81 y.o. [name] Prescious Hurless: [name] Other Clinician: Referring Lovina Zuver: Treating Zaynah Chawla/Extender: Lenox Ponds in Treatment: 8 Vital Signs Time Taken: 12:47 Temperature (F): 98 Height (in): 72 Pulse (bpm): 71 Weight (lbs): 205 Respiratory Rate (breaths/min): 18 Body Mass Index (BMI): 27.8 Blood Pressure (mmHg): 164/84 Reference Range: 80 - 120 mg / dl Electronic  Signature(s) Signed: 08/11/2022 5:40:17 PM By: Baruch Gouty RN, BSN Entered By: Baruch Gouty on 08/11/2022 12:50:30

## 2022-08-12 NOTE — Progress Notes (Signed)
Almyra DeforestVUNCANNON, Delmas Neal ([id number]) [id number] Page 1 of 9 Visit Report for 08/11/2022 Chief Complaint Document Details Patient Name: Date of Service: Zachary SingerV UNCA NNO N, [name]. 08/11/2022 12:30 PM Medical Record Number: [id number] Patient Account Number: [id number] Date of Birth/Sex: Treating RN: Jun 07, 1941 (81 y.o. M) Primary Care Provider: Jerl MinaHedrick, James Other Clinician: Referring Provider: Treating Provider/Extender: Cinda Questannon, Selma Rodelo Hedrick, James Weeks in Treatment: 8 Information Obtained from: Patient Chief Complaint Patient presents to the wound care center with open non-healing surgical wound(s) Electronic Signature(s) Signed: 08/11/2022 1:10:59 PM By: Duanne Guessannon, Maebry Obrien MD FACS Entered By: Duanne Guessannon, Patches Mcdonnell on 08/11/2022 13:10:58 -------------------------------------------------------------------------------- HPI Details Patient Name: Date of Service: Zachary ClockV UNCA NNO N, [name]. 08/11/2022 12:30 PM Medical Record Number: [id number] Patient Account Number: [id number] Date of Birth/Sex: Treating RN: Jun 07, 1941 (81 y.o. M) Primary Care Provider: Jerl MinaHedrick, James Other Clinician: Referring Provider: Treating Provider/Extender: Cinda Questannon, Johneisha Broaden Hedrick, James Weeks in Treatment: 8 History of Present Illness HPI Description: ADMISSION 06/14/2022 This is an 89 suffered a left bimalleolar ankle fracture in November 2022. He underwent ORIF of the fracture. His postoperative course was complicated by areas of wound dehiscence. On May 09, 2022, he was taken to the operating room by orthopedics again for hardware removal. Cultures taken from the operating room demonstrated methicillin sensitive Staph aureus. He is currently taking doxycycline. He is not diabetic, nor does he smoke. ABI in clinic today was 0.86; he is going to have formal ABIs performed this Friday at the vascular surgery clinic in [place]. There is a linear wound running down his lateral left ankle.  There are interspersed bands of epithelium. The wound does probe to bone over the malleolus. There is some slough accumulation but also some good granulation tissue filling in the wound bed. There is a single nylon suture remaining in place. 06/21/2022: The wound measured a little bit smaller today. There is good granulation tissue filling in from the depths of the wound. There is a small area where bone is still accessible with a probe. Minimal slough and biofilm accumulation. He has completed his course of oral antibiotics. He did have formal ABIs performed on Friday and they were normal bilaterally. 06/29/2022: The wound continues to contract. The more proximal and distal extensions of the incision have nearly closed. The main open portion of the wound continues to fill with granulation tissue. I am still able to probe to bone in 1 small area, but overall there has been good improvement. 10/13; the wound continues to contract. Healthy granulation. The deepest parts of the distal wound are actually in the mid aspect. Smaller areas superiorly look superficial. 10/20; minimally smaller. Using silver alginate. 07/21/2022: The wound continues to contract. The bone is completely covered. Good granulation tissue. 07/28/2022: The wound has contracted further. There is a healthy bed of granulation tissue present. Just a little bit of light slough accumulation. 08/04/2022: The wound is smaller again today. Good granulation tissue present with just a little bit of slough and eschar. 08/11/2022: The wound is smaller again this week. The granulation tissue is a bit hypertrophic. No concern for infection. Almyra DeforestVUNCANNON, [name] ([id number]) [id number] Page 2 of 9 Electronic Signature(s) Signed: 08/11/2022 1:11:29 PM By: Duanne Guessannon, Ladrea Holladay MD FACS Entered By: Duanne Guessannon, Marcus Groll on 08/11/2022 13:11:29 -------------------------------------------------------------------------------- Chemical  Cauterization Details Patient Name: Date of Service: Zachary ClockV UNCA NNO N, Reese Neal. 08/11/2022 12:30 PM Medical Record Number: [id number] Patient Account Number: [id number] Date of Birth/Sex: Treating RN: Jun 07, 1941 (81 y.o. Damaris SchoonerM) Boehlein, Linda Primary Care Provider: Jerl MinaHedrick, James Other Clinician:  Referring Provider: Treating Provider/Extender: Lenox Ponds in Treatment: 8 Procedure Performed for: Wound #1 Left,Lateral Lower Leg Performed By: Physician Fredirick Maudlin, MD Post Procedure Diagnosis Same as Pre-procedure Notes using silver nitrate stick Electronic Signature(s) Signed: 08/11/2022 5:13:10 PM By: Fredirick Maudlin MD FACS Signed: 08/11/2022 5:40:17 PM By: Baruch Gouty RN, BSN Entered By: Baruch Gouty on 08/11/2022 13:11:21 -------------------------------------------------------------------------------- Physical Exam Details Patient Name: Date of Service: Zachary Alto Neal. 08/11/2022 12:30 PM Medical Record Number: [id number] Patient Account Number: [id number] Date of Birth/Sex: Treating RN: Jan 14, 1941 (81 y.o. M) Primary Care Provider: [name] Other Clinician: Referring Provider: Treating Provider/Extender: Lenox Ponds in Treatment: 8 Constitutional Hypertensive, asymptomatic. . . . No acute distress.Marland Kitchen Respiratory Normal work of breathing on room air.. Notes 08/11/2022: The wound is smaller again this week. The granulation tissue is a bit hypertrophic. No concern for infection. Electronic Signature(s) Signed: 08/11/2022 1:12:00 PM By: Fredirick Maudlin MD FACS Entered By: Fredirick Maudlin on 08/11/2022 13:11:59 [name] ([id number]) [id number] Page 3 of 9 -------------------------------------------------------------------------------- Physician Orders Details Patient Name: Date of Service: [name], [name] 08/11/2022 12:30 PM Medical Record Number:  [id number] Patient Account Number: [id number] Date of Birth/Sex: Treating RN: 1941/09/17 (81 y.o. 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 - Dr. Celine Ahr RM 1 with Vaughan Basta Return A Anesthetic Wound #1 Left,Lateral Lower Leg (In clinic) Topical Lidocaine 4% applied to wound bed - prior to [employer] May shower with protection but do not get wound dressing(s) wet. Edema Control - Lymphedema / SCD / Other Elevate legs to the level of the heart or above for 30 minutes daily and/or when sitting, a frequency of: - throughout the day Avoid standing for long periods of time. Exercise regularly Home Health Wound #1 Left,Lateral Lower Leg No change in wound care orders this week; continue Home Health for wound care. May utilize formulary equivalent dressing for wound treatment orders unless otherwise specified. Dressing changes to be completed by [place] on Monday / Wednesday / Friday except when patient has scheduled visit at [facility]. Other Home Health Orders/Instructions: - [facility] Wound Treatment Wound #1 - Lower Leg Wound Laterality: Left, Lateral Peri-Wound Care: Sween Lotion (Moisturizing lotion) 3 x Per Week/30 Days Discharge Instructions: Apply moisturizing lotion as directed Prim Dressing: Hydrofera Blue Ready Foam, 2.5 x2.5 in 3 x Per Week/30 Days ary Discharge Instructions: Apply to wound bed as instructed Secondary Dressing: Optifoam Non-Adhesive Dressing, 4x4  in 3 x Per Week/30 Days Discharge Instructions: Apply to medial ankle bone to protect from irritation. Secondary Dressing: Woven Gauze Sponge, Non-Sterile 4x4 in (Generic) 3 x Per Week/30 Days Discharge Instructions: Apply over primary dressing as directed. Compression Wrap: Kerlix Roll 4.5x3.1 (in/yd) (Generic) 3 x Per Week/30 Days Discharge Instructions: Apply Kerlix and Coban compression as directed. Compression Wrap: Coban Self-Adherent Wrap 4x5 (in/yd) (Generic) 3 x Per Week/30 Days Discharge Instructions: Apply over Kerlix as directed. Electronic Signature(s) Signed: 08/11/2022 5:13:10 PM By: Fredirick Maudlin MD FACS Entered By: Fredirick Maudlin on 08/11/2022 13:13:33 [name] ([id number]) [id number] Page 4 of 9 -------------------------------------------------------------------------------- Problem List  Details Patient Name: Date of Service: [name], [name] 08/11/2022 12:30 PM Medical Record Number: [id number] Patient Account Number: [id number] Date of Birth/Sex: Treating RN: 10-28-1940 (81 y.o. 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 08/11/2022 12:30 PM Medical Record Number: [id number] Patient Account Number: [id number] Date of Birth/Sex: Treating RN: October 17, 1940 (81 y.o. M) Primary Care Provider: Jerl Mina Other Clinician: Referring Provider: Treating Provider/Extender: Cinda Quest in Treatment: 8 Subjective Chief Complaint Information obtained from Patient Patient presents to the wound care center with open non-healing surgical wound(s) History of Present Illness (HPI) ADMISSION 06/14/2022 This is an 81 suffered a left bimalleolar ankle fracture in November 2022. He underwent ORIF of the fracture. His postoperative course was complicated by areas of wound dehiscence. On May 09, 2022, he was taken to the operating room by orthopedics again for hardware removal. Cultures taken from the operating room demonstrated methicillin sensitive Staph aureus. He is currently taking doxycycline. He is not diabetic, nor does he smoke. ABI in clinic today was 0.86; he is going to have formal ABIs performed this Friday at the vascular surgery clinic in [place]. There is a linear wound running down his lateral left ankle. There are interspersed bands of epithelium. The wound does probe to bone over the malleolus. There [name], [name] ([id number]) [id number] Page 5 of 9 is some slough accumulation but also some good granulation tissue filling in the wound bed. There is a single nylon suture remaining in place. 06/21/2022: The wound measured a little bit smaller today. There is good granulation tissue filling in from the depths of the wound. There is a small area where bone is still accessible with a probe. Minimal slough and biofilm accumulation. He has completed his course of oral antibiotics. He did have formal ABIs performed on Friday and they were normal bilaterally. 06/29/2022: The wound continues to contract. The more proximal and distal extensions of  the incision have nearly closed. The main open portion of the wound continues to fill with granulation tissue. I am still able to probe to bone in 1 small area, but overall there has been good improvement. 10/13; the wound continues to contract. Healthy granulation. The deepest parts of the distal wound are actually in the mid aspect. Smaller areas superiorly look superficial. 10/20; minimally smaller. Using silver alginate. 07/21/2022: The wound continues to contract. The bone is completely covered. Good granulation tissue. 07/28/2022: The wound has contracted further. There is a healthy bed of granulation tissue present. Just a little bit of light slough accumulation. 08/04/2022: The wound is smaller again today. Good granulation tissue present with just a little bit of slough and eschar. 08/11/2022: The wound is smaller again this week. The granulation tissue is a bit hypertrophic. No concern for infection. Patient History Information obtained from Patient, Chart. Family History Heart Disease - Mother, Hypertension - Father, No family history of Cancer, Diabetes, Hereditary  Spherocytosis, Kidney Disease, Lung Disease, Seizures, Stroke, Thyroid Problems, Tuberculosis. Social History Never smoker, Marital Status - Married, Alcohol Use - Never, Drug Use - No History, Caffeine Use - Daily - coffee. Medical History Eyes Patient has history of Cataracts - right eye extraction Denies history of Glaucoma, Optic Neuritis Cardiovascular Patient has history of Arrhythmia - afib, Deep Vein Thrombosis - left leg, Hypertension Endocrine Denies history of Type I Diabetes, Type II Diabetes Genitourinary Denies history of End Stage Renal Disease Integumentary (Skin) Denies history of History of Burn Musculoskeletal Patient has history of Osteoarthritis Denies history of Osteomyelitis Neurologic Patient has history of Neuropathy, Seizure Disorder - hx Oncologic Denies history of Received  Chemotherapy, Received Radiation Psychiatric Denies history of Anorexia/bulimia, Confinement Anxiety Medical A Surgical History Notes nd Cardiovascular hyperlipidemia Gastrointestinal hx GI bleed Genitourinary hx pyelonephritis Neurologic hx cerebral hemorrhage Objective Constitutional Hypertensive, asymptomatic. No acute distress.. Vitals Time Taken: 12:47 PM, Height: 72 in, Weight: 205 lbs, BMI: 27.8, Temperature: 98 F, Pulse: 71 bpm, Respiratory Rate: 18 breaths/min, Blood Pressure: 164/84 mmHg. Respiratory Normal work of breathing on room air.. General Notes: 08/11/2022: The wound is smaller again this week. The granulation tissue is a bit hypertrophic. No concern for infection. Integumentary (Hair, Skin) [name], [name] ([id number]) [id number] Page 6 of 9 Wound #1 status is Open. Original cause of wound was Surgical Injury. The date acquired was: 05/09/2022. The wound has been in treatment 8 weeks. The wound is located on the Left,Lateral Lower Leg. The wound measures 1.6cm length x 0.7cm width x 0.2cm depth; 0.88cm^2 area and 0.176cm^3 volume. There is Fat Layer (Subcutaneous Tissue) exposed. There is no tunneling or undermining noted. There is a medium amount of serosanguineous drainage noted. The wound margin is distinct with the outline attached to the wound base. There is large (67-100%) red, hyper - granulation within the wound bed. There is no necrotic tissue within the wound bed. The periwound skin appearance had no abnormalities noted for moisture. The periwound skin appearance exhibited: Scarring, Erythema. The surrounding wound skin color is noted with erythema. Periwound temperature was noted as No Abnormality. The periwound has tenderness on palpation. 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 An Chemical Cauterization procedure was performed by Duanne Guess, MD. Post procedure Diagnosis Wound #1: Same as Pre-Procedure Notes: using silver nitrate stick Plan Follow-up Appointments: Return Appointment in 2 weeks. - Dr. Lady Gary RM 1 with Bonita Quin Anesthetic: Wound #1 Left,Lateral Lower Leg: (In clinic) Topical Lidocaine 4% applied to wound bed - prior to debridemnet Bathing/ Shower/ Hygiene: May shower with protection but do not get wound dressing(s) wet. Edema Control - Lymphedema / SCD / Other: Elevate legs to the level of the heart or above for 30 minutes daily and/or when sitting, a frequency of: - throughout the day Avoid standing for long periods of time. Exercise regularly Home Health: Wound #1 Left,Lateral Lower Leg: No change in wound care orders this week; continue Home Health for wound care. May utilize formulary equivalent dressing for wound treatment orders unless otherwise specified. Dressing changes to be completed by Home Health on Monday / Wednesday / Friday except when patient has scheduled visit at [facility]. Other Home Health Orders/Instructions: - [facility] WOUND #1: - Lower Leg Wound  Laterality: Left, Lateral Peri-Wound Care: Sween Lotion (Moisturizing lotion) 3 x Per Week/30 Days Discharge Instructions: Apply moisturizing lotion as directed Prim Dressing: Hydrofera Blue Ready Foam, 2.5 x2.5 in 3 x Per Week/30 Days ary Discharge Instructions: Apply to wound bed as instructed Secondary Dressing: Optifoam Non-Adhesive Dressing, 4x4 in 3 x Per Week/30 Days Discharge Instructions: Apply to medial ankle bone to protect from irritation. Secondary Dressing: Woven Gauze Sponge, Non-Sterile 4x4 in (Generic) 3 x Per Week/30 Days Discharge Instructions: Apply over primary  dressing as directed. Com pression Wrap: Kerlix Roll 4.5x3.1 (in/yd) (Generic) 3 x Per Week/30 Days Discharge Instructions: Apply Kerlix and Coban compression as directed. Com pression Wrap: Coban Self-Adherent Wrap 4x5 (in/yd) (Generic) 3 x Per Week/30 Days Discharge Instructions: Apply over Kerlix as directed. 08/11/2022: The wound is smaller again this week. The granulation tissue is a bit hypertrophic. No concern for infection. No debridement was necessary today. I chemically cauterized the hypertrophic granulation tissue using silver nitrate. We will continue with Hydrofera Blue, Kerlix and Coban wrap. Due to the Thanksgiving holiday, we will have him follow-up in 2 weeks. Electronic Signature(s) Signed: 08/11/2022 1:14:05 PM By: Fredirick Maudlin MD FACS Entered By: Fredirick Maudlin on 08/11/2022 13:14:05 [name] ([id number]) [id number] Page 7 of 9 -------------------------------------------------------------------------------- HxROS Details Patient Name: Date of Service: [name], [name] 08/11/2022 12:30 PM Medical Record Number: [id number] Patient Account Number: [id number] Date of Birth/Sex: Treating RN: [birth year] (81 y.o. M) Primary Care Provider: [name] Other Clinician: Referring Provider: Treating Provider/Extender: Lenox Ponds in Treatment: 8 Information Obtained From Patient Chart Eyes Medical History: Positive for: Cataracts - right eye extraction Negative for: Glaucoma; Optic Neuritis Cardiovascular Medical History: Positive for: Arrhythmia - afib; Deep Vein Thrombosis - left leg; Hypertension Past Medical History Notes: hyperlipidemia Gastrointestinal Medical History: Past Medical History Notes: hx GI bleed Endocrine Medical History: Negative for: Type I Diabetes; Type II Diabetes Genitourinary Medical History: Negative for: End Stage Renal Disease Past Medical History Notes: hx  pyelonephritis Integumentary (Skin) Medical History: Negative for: History of Burn Musculoskeletal Medical History: Positive for: Osteoarthritis Negative for: Osteomyelitis Neurologic Medical History: Positive for: Neuropathy; Seizure Disorder - hx Past Medical History Notes: hx cerebral hemorrhage Oncologic Medical History: Negative for: Received Chemotherapy; Received Radiation Psychiatric Medical History: Negative for: Anorexia/bulimia; Confinement Anxiety HBO Extended History Items [name], [name] ([id number]) [id number] Page 8 of 9 Eyes: Cataracts Immunizations Pneumococcal Vaccine: Received Pneumococcal Vaccination: Yes Received Pneumococcal Vaccination On or After 60th Birthday: Yes Implantable Devices None Family and Social History Cancer: No; Diabetes: No; Heart Disease: Yes - Mother; Hereditary Spherocytosis: No; Hypertension: Yes - Father; Kidney Disease: No; Lung Disease: No; Seizures: No; Stroke: No; Thyroid Problems: No; Tuberculosis: No; Never smoker; Marital Status - Married; Alcohol Use: Never; Drug Use: No History; Caffeine Use: Daily - coffee; Financial Concerns: No; Food, Clothing or Shelter Needs: No; Support System Lacking: No; Transportation Concerns: Yes - caregiver provides rides in am Electronic Signature(s) Signed: 08/11/2022 5:13:10 PM By: Fredirick Maudlin MD FACS Entered By: Fredirick Maudlin on 08/11/2022 13:11:36 -------------------------------------------------------------------------------- SuperBill Details Patient Name: Date of Service: Zachary Alto Neal. 08/11/2022 Medical Record Number: [id number] Patient Account Number: [id number] Date of Birth/Sex: Treating RN: December 22, 1940 (81 y.o. 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 without evidenc Modifier: Neal of necrosis Quantity: 1 Physician Procedures : CPT4 Code Description Modifier [id number] - WC PHYS LEVEL 3 - EST PT ICD-10 Diagnosis Description 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

## 2022-08-15 DIAGNOSIS — R350 Frequency of micturition: Secondary | ICD-10-CM | POA: Diagnosis not present

## 2022-08-15 DIAGNOSIS — D649 Anemia, unspecified: Secondary | ICD-10-CM | POA: Diagnosis not present

## 2022-08-15 DIAGNOSIS — Z23 Encounter for immunization: Secondary | ICD-10-CM | POA: Diagnosis not present

## 2022-08-15 DIAGNOSIS — I10 Essential (primary) hypertension: Secondary | ICD-10-CM | POA: Diagnosis not present

## 2022-08-18 DIAGNOSIS — I4891 Unspecified atrial fibrillation: Secondary | ICD-10-CM | POA: Diagnosis not present

## 2022-08-18 DIAGNOSIS — B9561 Methicillin susceptible Staphylococcus aureus infection as the cause of diseases classified elsewhere: Secondary | ICD-10-CM | POA: Diagnosis not present

## 2022-08-18 DIAGNOSIS — M1990 Unspecified osteoarthritis, unspecified site: Secondary | ICD-10-CM | POA: Diagnosis not present

## 2022-08-18 DIAGNOSIS — G629 Polyneuropathy, unspecified: Secondary | ICD-10-CM | POA: Diagnosis not present

## 2022-08-18 DIAGNOSIS — I10 Essential (primary) hypertension: Secondary | ICD-10-CM | POA: Diagnosis not present

## 2022-08-18 DIAGNOSIS — Z8781 Personal history of (healed) traumatic fracture: Secondary | ICD-10-CM | POA: Diagnosis not present

## 2022-08-18 DIAGNOSIS — Z9181 History of falling: Secondary | ICD-10-CM | POA: Diagnosis not present

## 2022-08-18 DIAGNOSIS — Z7901 Long term (current) use of anticoagulants: Secondary | ICD-10-CM | POA: Diagnosis not present

## 2022-08-18 DIAGNOSIS — Z9841 Cataract extraction status, right eye: Secondary | ICD-10-CM | POA: Diagnosis not present

## 2022-08-18 DIAGNOSIS — G40909 Epilepsy, unspecified, not intractable, without status epilepticus: Secondary | ICD-10-CM | POA: Diagnosis not present

## 2022-08-18 DIAGNOSIS — E785 Hyperlipidemia, unspecified: Secondary | ICD-10-CM | POA: Diagnosis not present

## 2022-08-18 DIAGNOSIS — H547 Unspecified visual loss: Secondary | ICD-10-CM | POA: Diagnosis not present

## 2022-08-18 DIAGNOSIS — Z8719 Personal history of other diseases of the digestive system: Secondary | ICD-10-CM | POA: Diagnosis not present

## 2022-08-18 DIAGNOSIS — T8149XD Infection following a procedure, other surgical site, subsequent encounter: Secondary | ICD-10-CM | POA: Diagnosis not present

## 2022-08-18 DIAGNOSIS — Z86718 Personal history of other venous thrombosis and embolism: Secondary | ICD-10-CM | POA: Diagnosis not present

## 2022-08-18 DIAGNOSIS — T8132XD Disruption of internal operation (surgical) wound, not elsewhere classified, subsequent encounter: Secondary | ICD-10-CM | POA: Diagnosis not present

## 2022-08-23 ENCOUNTER — Encounter (HOSPITAL_BASED_OUTPATIENT_CLINIC_OR_DEPARTMENT_OTHER): Payer: Medicare Other | Admitting: General Surgery

## 2022-08-23 DIAGNOSIS — L97326 Non-pressure chronic ulcer of left ankle with bone involvement without evidence of necrosis: Secondary | ICD-10-CM | POA: Diagnosis not present

## 2022-08-23 DIAGNOSIS — T8131XS Disruption of external operation (surgical) wound, not elsewhere classified, sequela: Secondary | ICD-10-CM | POA: Diagnosis not present

## 2022-08-23 DIAGNOSIS — I10 Essential (primary) hypertension: Secondary | ICD-10-CM | POA: Diagnosis not present

## 2022-08-23 NOTE — Progress Notes (Signed)
JDEN, WANT ([id number]) [id number] Page 1 of 9 Visit Report for 08/23/2022 Chief Complaint Document Details Patient Name: Date of Service: [name], [name] 08/23/2022 12:30 PM Medical Record Number: [id number] Patient Account Number: [id number] Date of Birth/Sex: Treating RN: 11-04-1940 (81 y.o. M) Primary Care Provider: Jerl Mina Other Clinician: Referring Provider: Treating Provider/Extender: Cinda Quest in Treatment: 10 Information Obtained from: Patient Chief Complaint Patient presents to the [facility] with open non-healing surgical Zachary(s) Electronic Signature(s) Signed: 08/23/2022 1:28:34 PM By: Duanne Guess MD FACS Entered By: Duanne Guess on 08/23/2022 13:28:34 -------------------------------------------------------------------------------- Debridement Details Patient Name: Date of Service: Zachary Clock E. 08/23/2022 12:30 PM Medical Record Number: [id number] Patient Account Number: [id number] Date of Birth/Sex: Treating RN: Oct 24, 1940 (81 y.o. Dianna Limbo Primary Care Provider: Jerl Mina Other Clinician: Referring Provider: Treating Provider/Extender: Cinda Quest in Treatment: 10 Debridement Performed for Assessment: Zachary #1 Left,Lateral Lower Neal Performed By: Physician Duanne Guess, MD Debridement Type: Debridement Level of Consciousness (Pre-procedure): Awake and Alert Pre-procedure Verification/Time Out Yes - 13:01 Taken: Start Time: 13:01 Pain Control: Lidocaine 5% topical ointment T Area Debrided (L x W): otal 0.5 (cm) x 0.4 (cm) = 0.2 (cm) Tissue and other material debrided: Non-Viable, Eschar Level: Non-Viable Tissue Debridement Description: Selective/Open Zachary Instrument: Curette Bleeding: Minimum Hemostasis Achieved: Pressure End Time: 13:02 Procedural Pain: 0 Post Procedural Pain: 0 Response to Treatment:  Procedure was tolerated well Level of Consciousness (Post- Awake and Alert procedure): Post Debridement Measurements of Total Zachary Length: (cm) 0.5 Width: (cm) 0.4 Depth: (cm) 0.2 Volume: (cm) 0.031 Character of Zachary/Ulcer Post Debridement: Improved Post Procedure Diagnosis [name], [name] ([id number]) [id number] Page 2 of 9 Same as Pre-procedure Notes Scribed for Dr. Lady Gary by J.Scotton Electronic Signature(s) Signed: 08/23/2022 1:41:48 PM By: Duanne Guess MD FACS Signed: 08/23/2022 5:43:15 PM By: Karie Schwalbe RN Entered By: Karie Schwalbe on 08/23/2022 13:04:44 -------------------------------------------------------------------------------- HPI Details Patient Name: Date of Service: Zachary Clock E. 08/23/2022 12:30 PM Medical Record Number: [id number] Patient Account Number: [id number] Date of Birth/Sex: Treating RN: 12-17-1940 (81 y.o. M) Primary Care Provider: Jerl Mina Other Clinician: Referring Provider: Treating Provider/Extender: Cinda Quest in Treatment: 10 History of Present Illness HPI Description: ADMISSION 06/14/2022 This is an 6 suffered a left bimalleolar ankle fracture in November 2022. He underwent ORIF of the fracture. His postoperative course was complicated by areas of Zachary dehiscence. On May 09, 2022, he was taken to the operating room by orthopedics again for hardware removal. Cultures taken from the operating room demonstrated methicillin sensitive Staph aureus. He is currently taking doxycycline. He is not diabetic, nor does he smoke. ABI in clinic today was 0.86; he is going to have formal ABIs performed this Friday at the vascular surgery clinic in [place]. There is a linear Zachary running down his lateral left ankle. There are interspersed bands of epithelium. The Zachary does probe to bone over the malleolus. There is some slough accumulation but also some good  granulation tissue filling in the Zachary bed. There is a single nylon suture remaining in place. 06/21/2022: The Zachary measured a little bit smaller today. There is good granulation tissue filling in from the depths of the Zachary. There is a small area where bone is still accessible with a probe. Minimal slough and biofilm accumulation. He has completed his course of oral antibiotics. He did have formal ABIs performed on Friday and they were normal bilaterally.  06/29/2022: The Zachary continues to contract. The more proximal and distal extensions of the incision have nearly closed. The main open portion of the Zachary continues to fill with granulation tissue. I am still able to probe to bone in 1 small area, but overall there has been good improvement. 10/13; the Zachary continues to contract. Healthy granulation. The deepest parts of the distal Zachary are actually in the mid aspect. Smaller areas superiorly look superficial. 10/20; minimally smaller. Using silver alginate. 07/21/2022: The Zachary continues to contract. The bone is completely covered. Good granulation tissue. 07/28/2022: The Zachary has contracted further. There is a healthy bed of granulation tissue present. Just a little bit of light slough accumulation. 08/04/2022: The Zachary is smaller again today. Good granulation tissue present with just a little bit of slough and eschar. 08/11/2022: The Zachary is smaller again this week. The granulation tissue is a bit hypertrophic. No concern for infection. 08/23/2022: His Zachary is down to about half a centimeter. It is clean with just some eschar around the edges. Electronic Signature(s) Signed: 08/23/2022 1:29:13 PM By: Fredirick Maudlin MD FACS Entered By: Fredirick Maudlin on 08/23/2022 13:29:13 -------------------------------------------------------------------------------- Physical Exam Details Patient Name: Date of Service: Zachary Alto E. 08/23/2022 12:30 PM Medical Record Number:  [id number] Patient Account Number: [id number] Date of Birth/Sex: Treating RN: 1940/10/29 (81 y.o. [name] ([id number]) [id number] Page 3 of 9 Primary Care Provider: [name] Other Clinician: Referring Provider: Treating Provider/Extender: Lenox Ponds in Treatment: 10 Constitutional He is hypertensive, but asymptomatic.Marland Kitchen Slightly bradycardic, asymptomatic. . . No acute distress. Respiratory Normal work of breathing on room air. Notes 08/23/2022: His Zachary is down to about half a centimeter. It is clean with just some eschar around the edges. Electronic Signature(s) Signed: 08/23/2022 1:29:46 PM By: Fredirick Maudlin MD FACS Entered By: Fredirick Maudlin on 08/23/2022 13:29:46 -------------------------------------------------------------------------------- Physician Orders Details Patient Name: Date of Service: Zachary Alto E. 08/23/2022 12:30 PM Medical Record Number: [id number] Patient Account Number: [id number] Date of Birth/Sex: Treating RN: [birth year] (81 y.o. 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 - Dr. Celine Ahr Room 1 Return A Anesthetic Zachary #1 Left,Lateral Lower Neal (In clinic) Topical Lidocaine 4% applied to Zachary bed - prior to [employer] May shower with protection but do not get Zachary dressing(s) wet. Edema Control - Lymphedema / SCD / Other Elevate legs to the level of the heart or  above for 30 minutes daily and/or when sitting, a frequency of: - throughout the day Avoid standing for long periods of time. Exercise regularly Home Health Zachary #1 Left,Lateral Lower Neal No change in Zachary care orders this week; continue Home Health for Zachary care. May utilize formulary equivalent dressing for Zachary treatment orders unless otherwise specified. Dressing changes to be completed by [place] on Monday / Wednesday / Friday except when patient has scheduled visit at [facility]. Other Home Health Orders/Instructions: - [facility] Zachary Treatment [name] Laterality: Left, Lateral Peri-Zachary Care: Sween Lotion (Moisturizing lotion) 3 x Per Week/30 Days Discharge Instructions: Apply moisturizing lotion as directed Prim Dressing: Hydrofera  Blue Ready Foam, 2.5 x2.5 in 3 x Per Week/30 Days ary Discharge Instructions: Apply to Zachary bed as instructed [name], [name] ([id number]) [id number] Page 4 of 9 Secondary Dressing: Optifoam Non-Adhesive Dressing, 4x4 in 3 x Per Week/30 Days Discharge Instructions: Apply to medial ankle bone to protect from irritation. Secondary Dressing: Woven Gauze Sponge, Non-Sterile 4x4 in (Generic) 3 x Per Week/30 Days Discharge Instructions: Apply over primary dressing as directed. Compression Wrap: Kerlix Roll 4.5x3.1 (in/yd) (Generic) 3 x Per Week/30 Days Discharge Instructions: Apply Kerlix and Coban compression as directed. Compression Wrap: Coban Self-Adherent Wrap 4x5 (in/yd) (Generic) 3 x Per Week/30 Days Discharge Instructions: Apply over Kerlix as directed. Electronic Signature(s) Signed: 08/23/2022 1:41:48 PM By: Fredirick Maudlin MD FACS Entered By: Fredirick Maudlin on 08/23/2022 13:30:06 -------------------------------------------------------------------------------- Problem List Details Patient Name: Date of Service: Zachary Alto E. 08/23/2022 12:30 PM Medical Record  Number: [id number] Patient Account Number: [id number] Date of Birth/Sex: Treating RN: 09/02/1941 (81 y.o. 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 without 06/14/2022 No Yes evidence of necrosis T81.31XS Disruption of external operation (surgical) Zachary, not elsewhere classified, 06/14/2022 No Yes sequela I10 Essential (primary) hypertension 06/14/2022 No Yes Inactive Problems Resolved Problems Electronic Signature(s) Signed: 08/23/2022 1:28:23 PM By: Fredirick Maudlin MD FACS Entered By: Fredirick Maudlin on 08/23/2022 13:28:22 Progress Note Details -------------------------------------------------------------------------------- [name] ([id number]) [id number] Page 5 of 9 Patient Name: Date of Service: [name], [name] 08/23/2022 12:30 PM Medical Record Number: [id number] Patient Account Number: [id number] Date of Birth/Sex: Treating RN: 1940-11-17 (81 y.o. M) Primary Care Provider: [name] Other Clinician: Referring Provider: Treating Provider/Extender: Lenox Ponds in Treatment: 10 Subjective Chief Complaint Information obtained from Patient Patient presents to the Zachary care center with open non-healing surgical Zachary(s) History of Present Illness (HPI) ADMISSION 06/14/2022 This is an 62 suffered a left bimalleolar ankle fracture in November 2022. He underwent ORIF of the fracture. His postoperative course was complicated by areas of Zachary dehiscence. On May 09, 2022, he was taken to the operating room by orthopedics again for hardware removal. Cultures taken from the operating room demonstrated methicillin sensitive Staph aureus. He is  currently taking doxycycline. He is not diabetic, nor does he smoke. ABI in clinic today was 0.86; he is going to have formal ABIs performed this Friday at the vascular surgery clinic in [place]. There is a linear Zachary running down his lateral left ankle. There are interspersed bands of epithelium. The Zachary does probe to bone over the malleolus. There is some slough accumulation but also some good granulation tissue filling in the Zachary bed. There is a single nylon suture remaining in place. 06/21/2022: The Zachary measured a little bit smaller today. There is good granulation tissue filling in from the depths of the Zachary. There is a small area where bone is still accessible with a probe. Minimal slough and biofilm accumulation. He has completed his course of oral antibiotics. He did have formal ABIs performed on Friday and they were normal bilaterally. 06/29/2022: The Zachary continues to contract. The more proximal and distal extensions of the incision have nearly closed. The main open portion of the Zachary continues to fill with granulation tissue. I am still able to probe to bone in 1 small area, but overall there has  been good improvement. 10/13; the Zachary continues to contract. Healthy granulation. The deepest parts of the distal Zachary are actually in the mid aspect. Smaller areas superiorly look superficial. 10/20; minimally smaller. Using silver alginate. 07/21/2022: The Zachary continues to contract. The bone is completely covered. Good granulation tissue. 07/28/2022: The Zachary has contracted further. There is a healthy bed of granulation tissue present. Just a little bit of light slough accumulation. 08/04/2022: The Zachary is smaller again today. Good granulation tissue present with just a little bit of slough and eschar. 08/11/2022: The Zachary is smaller again this week. The granulation tissue is a bit hypertrophic. No concern for infection. 08/23/2022: His Zachary is down to about half a  centimeter. It is clean with just some eschar around the edges. Patient History Information obtained from Patient, Chart. Family History Heart Disease - Mother, Hypertension - Father, No family history of Cancer, Diabetes, Hereditary Spherocytosis, Kidney Disease, Lung Disease, Seizures, Stroke, Thyroid Problems, Tuberculosis. Social History Never smoker, Marital Status - Married, Alcohol Use - Never, Drug Use - No History, Caffeine Use - Daily - coffee. Medical History Eyes Patient has history of Cataracts - right eye extraction Denies history of Glaucoma, Optic Neuritis Cardiovascular Patient has history of Arrhythmia - afib, Deep Vein Thrombosis - left Neal, Hypertension Endocrine Denies history of Type I Diabetes, Type II Diabetes Genitourinary Denies history of End Stage Renal Disease Integumentary (Skin) Denies history of History of Burn Musculoskeletal Patient has history of Osteoarthritis Denies history of Osteomyelitis Neurologic Patient has history of Neuropathy, Seizure Disorder - hx Oncologic Denies history of Received Chemotherapy, Received Radiation Psychiatric Denies history of Anorexia/bulimia, Confinement Anxiety Medical A Surgical History Notes nd Cardiovascular hyperlipidemia Gastrointestinal hx GI bleed Genitourinary hx pyelonephritis Neurologic hx cerebral hemorrhage Almyra DeforestVUNCANNON, Breeze E ([id number]) [id number] Page 6 of 9 Objective Constitutional He is hypertensive, but asymptomatic.Marland Kitchen. Slightly bradycardic, asymptomatic. No acute distress. Vitals Time Taken: 12:50 PM, Height: 72 in, Weight: 205 lbs, BMI: 27.8, Temperature: 97.4 F, Pulse: 56 bpm, Respiratory Rate: 16 breaths/min, Blood Pressure: 156/73 mmHg. Respiratory Normal work of breathing on room air. General Notes: 08/23/2022: His Zachary is down to about half a centimeter. It is clean with just some eschar around the edges. Integumentary (Hair, Skin) Zachary #1 status  is Open. Original cause of Zachary was Surgical Injury. The date acquired was: 05/09/2022. The Zachary has been in treatment 10 weeks. The Zachary is located on the Left,Lateral Lower Neal. The Zachary measures 0.5cm length x 0.4cm width x 0.2cm depth; 0.157cm^2 area and 0.031cm^3 volume. There is Fat Layer (Subcutaneous Tissue) exposed. There is no tunneling or undermining noted. There is a medium amount of serosanguineous drainage noted. The Zachary margin is distinct with the outline attached to the Zachary base. There is large (67-100%) red, hyper - granulation within the Zachary bed. There is a small (1- 33%) amount of necrotic tissue within the Zachary bed including Eschar and Adherent Slough. The periwound skin appearance had no abnormalities noted for moisture. The periwound skin appearance exhibited: Scarring, Erythema. The surrounding Zachary skin color is noted with erythema. Periwound temperature was noted as No Abnormality. The periwound has tenderness on palpation. 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 There was a Selective/Open Zachary Non-Viable Tissue Debridement with a  total area of 0.2 sq cm performed by Fredirick Maudlin, MD. With the following instrument(s): Curette to remove Non-Viable tissue/material. Material removed includes Eschar after achieving pain control using Lidocaine 5% topical ointment. No specimens were taken. A time out was conducted at 13:01, prior to the start of the procedure. A Minimum amount of bleeding was controlled with Pressure. The procedure was tolerated well with a pain level of 0 throughout and a pain level of 0 following the procedure. Post  Debridement Measurements: 0.5cm length x 0.4cm width x 0.2cm depth; 0.031cm^3 volume. Character of Zachary/Ulcer Post Debridement is improved. Post procedure Diagnosis Zachary #1: Same as Pre-Procedure General Notes: Scribed for Dr. Celine Ahr by J.Scotton. Plan Follow-up Appointments: Return Appointment in 1 week. - Dr. Celine Ahr Room 1 Anesthetic: Zachary #1 Left,Lateral Lower Neal: (In clinic) Topical Lidocaine 4% applied to Zachary bed - prior to debridemnet Bathing/ Shower/ Hygiene: May shower with protection but do not get Zachary dressing(s) wet. Edema Control - Lymphedema / SCD / Other: Elevate legs to the level of the heart or above for 30 minutes daily and/or when sitting, a frequency of: - throughout the day Avoid standing for long periods of time. Exercise regularly Home Health: Zachary #1 Left,Lateral Lower Neal: No change in Zachary care orders this week; continue Home Health for Zachary care. May utilize formulary equivalent dressing for Zachary treatment orders unless otherwise specified. Dressing changes to be completed by [place] on Monday / Wednesday / Friday except when patient has scheduled visit at [facility]. Other Home Health Orders/Instructions: [name], [name] ([id number]) [id number] Page 7 of 9 Zachary #1: - Lower [name] Laterality: Left, Lateral Peri-Zachary Care: Sween Lotion (Moisturizing lotion) 3 x Per Week/30 Days Discharge Instructions: Apply moisturizing lotion as directed Prim Dressing: Hydrofera Blue Ready Foam, 2.5 x2.5 in 3 x Per Week/30 Days ary Discharge Instructions: Apply to Zachary bed as instructed Secondary Dressing: Optifoam Non-Adhesive Dressing, 4x4 in 3 x Per Week/30 Days Discharge Instructions: Apply to medial ankle bone to protect from irritation. Secondary Dressing: Woven Gauze Sponge, Non-Sterile 4x4 in (Generic) 3 x Per Week/30 Days Discharge Instructions: Apply over primary dressing as directed. Com  pression Wrap: Kerlix Roll 4.5x3.1 (in/yd) (Generic) 3 x Per Week/30 Days Discharge Instructions: Apply Kerlix and Coban compression as directed. Com pression Wrap: Coban Self-Adherent Wrap 4x5 (in/yd) (Generic) 3 x Per Week/30 Days Discharge Instructions: Apply over Kerlix as directed. 08/23/2022: His Zachary is down to about half a centimeter. It is clean with just some eschar around the edges. I used a curette to debride the eschar from the Zachary. We will continue Hydrofera Blue with Kerlix and Coban wrap. Follow-up in 1 week. I anticipate the Zachary will be closed in the next couple of weeks. Electronic Signature(s) Signed: 08/23/2022 1:30:32 PM By: Fredirick Maudlin MD FACS Entered By: Fredirick Maudlin on 08/23/2022 13:30:32 -------------------------------------------------------------------------------- HxROS Details Patient Name: Date of Service: Zachary Alto E. 08/23/2022 12:30 PM Medical Record Number: [id number] Patient Account Number: [id number] Date of Birth/Sex: Treating RN: 1941/06/10 (81 y.o. M) Primary Care Provider: [name] Other Clinician: Referring Provider: Treating Provider/Extender: Lenox Ponds in Treatment: 10 Information Obtained From Patient Chart Eyes Medical History: Positive for: Cataracts - right eye extraction Negative for: Glaucoma; Optic Neuritis Cardiovascular Medical History: Positive for: Arrhythmia - afib; Deep Vein Thrombosis - left Neal; Hypertension Past Medical History Notes: hyperlipidemia Gastrointestinal Medical History: Past Medical History Notes: hx GI bleed Endocrine Medical History: Negative for:  Type I Diabetes; Type II Diabetes Genitourinary Medical History: Negative for: End Stage Renal Disease Past Medical History Notes: hx pyelonephritis Integumentary (Skin) CHIRSTOPHER, SCHWARZ ([id number]) [id number] Page 8 of 9 Medical History: Negative for: History of  Burn Musculoskeletal Medical History: Positive for: Osteoarthritis Negative for: Osteomyelitis Neurologic Medical History: Positive for: Neuropathy; Seizure Disorder - hx Past Medical History Notes: hx cerebral hemorrhage Oncologic Medical History: Negative for: Received Chemotherapy; Received Radiation Psychiatric Medical History: Negative for: Anorexia/bulimia; Confinement Anxiety HBO Extended History Items Eyes: Cataracts Immunizations Pneumococcal Vaccine: Received Pneumococcal Vaccination: Yes Received Pneumococcal Vaccination On or After 60th Birthday: Yes Implantable Devices None Family and Social History Cancer: No; Diabetes: No; Heart Disease: Yes - Mother; Hereditary Spherocytosis: No; Hypertension: Yes - Father; Kidney Disease: No; Lung Disease: No; Seizures: No; Stroke: No; Thyroid Problems: No; Tuberculosis: No; Never smoker; Marital Status - Married; Alcohol Use: Never; Drug Use: No History; Caffeine Use: Daily - coffee; Financial Concerns: No; Food, Clothing or Shelter Needs: No; Support System Lacking: No; Transportation Concerns: Yes - caregiver provides rides in am Electronic Signature(s) Signed: 08/23/2022 1:41:48 PM By: Fredirick Maudlin MD FACS Entered By: Fredirick Maudlin on 08/23/2022 13:29:18 -------------------------------------------------------------------------------- SuperBill Details Patient Name: Date of Service: Zachary Alto E. 08/23/2022 Medical Record Number: [id number] Patient Account Number: [id number] Date of Birth/Sex: Treating RN: January 14, 1941 (81 y.o. 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 T81.31XS Disruption of external operation (surgical) Zachary, not elsewhere  classified, sequela I10 Essential (primary) hypertension ANTERO, MELROY ([id number]) [id number] Page 9 of 9 Facility Procedures : CPT4 Code: [id number] Description: [phone] - DEBRIDE Zachary 1ST 20 SQ CM OR < ICD-10 Diagnosis Description 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 without evidence Modifier: of necrosis Quantity: 1 Physician Procedures : CPT4 Code Description Modifier [id number] - WC PHYS LEVEL 3 - EST PT 25 ICD-10 Diagnosis Description 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

## 2022-08-23 NOTE — Progress Notes (Signed)
Neal, Zachary E ([id number]) [id number] Page 1 of 7 Visit Report for 08/23/2022 Arrival Information Details Patient Name: Date of Service: [name], Zachary E. 08/23/2022 12:30 PM Medical Record Number: [id number] Patient Account Number: [id number] Date of Birth/Sex: Treating RN: 06/30/1941 (81 y.o. M) Neal, Zachary Primary Care : Neal, Zachary Other Clinician: Referring : Treating /Extender: Cannon, Jennifer Neal, Zachary Weeks in Treatment: 10 Visit Information History Since Last Visit Added or deleted any medications: No Patient Arrived: Wheel Chair Had a fall or experienced change in No Arrival Time: 12:40 activities of daily living that may affect Accompanied By: spouse risk of falls: Transfer Assistance: None Signs or symptoms of abuse/neglect since last visito No Patient Identification Verified: Yes Hospitalized since last visit: No Patient Requires Transmission-Based Precautions: No Has Dressing in Place as Prescribed: Yes Patient Has Alerts: Yes Has Compression in Place as Prescribed: Yes Patient Alerts: Patient on Blood Thinner Pain Present Now: No Electronic Signature(s) Signed: 08/23/2022 5:43:15 PM By: Neal, Joanne RN Entered By: Neal, Zachary on 08/23/2022 12:51:20 -------------------------------------------------------------------------------- Encounter Discharge Information Details Patient Name: Date of Service: [name], Zachary E. 08/23/2022 12:30 PM Medical Record Number: [id number] Patient Account Number: [id number] Date of Birth/Sex: Treating RN: 10/16/1940 (81 y.o. M) Neal, Zachary Primary Care : Neal, Zachary Other Clinician: Referring : Treating /Extender: Cannon, Jennifer Neal, Zachary Weeks in Treatment: 10 Encounter Discharge Information Items Post Procedure Vitals Discharge Condition: Stable Temperature (F): 97.4 Ambulatory Status: Ambulatory Pulse  (bpm): 56 Discharge Destination: Home Respiratory Rate (breaths/min): 16 Transportation: Private Auto Blood Pressure (mmHg): 156/73 Accompanied By: self Schedule Follow-up Appointment: Yes Clinical Summary of Care: Patient Declined Electronic Signature(s) Signed: 08/23/2022 5:43:15 PM By: Neal, Joanne RN Entered By: Neal, Zachary on 08/23/2022 17:22:21 Hilgeman, Garlin E ([id number]) [id number] Page 2 of 7 -------------------------------------------------------------------------------- Lower Extremity Assessment Details Patient Name: Date of Service: [name], Zachary E. 08/23/2022 12:30 PM Medical Record Number: [id number] Patient Account Number: [id number] Date of Birth/Sex: Treating RN: 03/25/1941 (81 y.o. M) Neal, Zachary Primary Care : Neal, Zachary Other Clinician: Referring : Treating /Extender: Cannon, Jennifer Neal, Zachary Weeks in Treatment: 10 Edema Assessment Assessed: [Left: No] [Right: No] Edema: [Left: Ye] [Right: s] Calf Left: Right: Point of Measurement: From Medial Instep 31 cm Ankle Left: Right: Point of Measurement: From Medial Instep 21 cm Vascular Assessment Pulses: Dorsalis Pedis Palpable: [Left:Yes] Electronic Signature(s) Signed: 08/23/2022 5:43:15 PM By: Neal, Joanne RN Entered By: Neal, Zachary on 08/23/2022 13:01:56 -------------------------------------------------------------------------------- Multi Wound Chart Details Patient Name: Date of Service: [name], Zachary E. 08/23/2022 12:30 PM Medical Record Number: [id number] Patient Account Number: [id number] Date of Birth/Sex: Treating RN: 08/12/1941 (81 y.o. M) Primary Care : Neal, Zachary Other Clinician: Referring : Treating /Extender: Cannon, Jennifer Neal, Zachary Weeks in Treatment: 10 Vital Signs Height(in): 72 Pulse(bpm): 56 Weight(lbs): 205 Blood Pressure(mmHg): 156/73 Body Mass  Index(BMI): 27.8 Temperature(°F): 97.4 Respiratory Rate(breaths/min): 16 [1:Photos:] [N/A:N/A] Left, Lateral Lower Leg N/A N/A Wound Location: Surgical Injury N/A N/A Wounding Event: Open Surgical Wound N/A N/A Primary Etiology: Cataracts, Arrhythmia, Deep Vein N/A N/A Comorbid History: Thrombosis, Hypertension, Neal, Zachary E ([id number]) [id number] Page 3 of 7 Osteoarthritis, Neuropathy, Seizure Disorder 05/09/2022 N/A N/A Date Acquired: 10 N/A N/A Weeks of Treatment: Open N/A N/A Wound Status: No N/A N/A Wound Recurrence: Yes N/A N/A Clustered Wound: 3 N/A N/A Clustered Quantity: 0.5x0.4x0.2 N/A N/A Measurements L x W x D (cm) 0.157 N/A N/A A (cm) : rea 0.031 N/A N/A Volume (cm) :   98.80% N/A N/A % Reduction in A rea: 99.70% N/A N/A % Reduction in Volume: Full Thickness With Exposed Support N/A N/A Classification: Structures Medium N/A N/A Exudate A mount: Serosanguineous N/A N/A Exudate Type: red, brown N/A N/A Exudate Color: Distinct, outline attached N/A N/A Wound Margin: Large (67-100%) N/A N/A Granulation A mount: Red, Hyper-granulation N/A N/A Granulation Quality: Small (1-33%) N/A N/A Necrotic A mount: Eschar, Adherent Slough N/A N/A Necrotic Tissue: Fat Layer (Subcutaneous Tissue): Yes N/A N/A Exposed Structures: Fascia: No Tendon: No Muscle: No Joint: No Bone: No Small (1-33%) N/A N/A Epithelialization: Debridement - Selective/Open Wound N/A N/A Debridement: Pre-procedure Verification/Time Out 13:01 N/A N/A Taken: Lidocaine 5% topical ointment N/A N/A Pain Control: Necrotic/Eschar N/A N/A Tissue Debrided: Non-Viable Tissue N/A N/A Level: 0.2 N/A N/A Debridement A (sq cm): rea Curette N/A N/A Instrument: Minimum N/A N/A Bleeding: Pressure N/A N/A Hemostasis A chieved: 0 N/A N/A Procedural Pain: 0 N/A N/A Post Procedural Pain: Procedure was tolerated well N/A N/A Debridement Treatment  Response: 0.5x0.4x0.2 N/A N/A Post Debridement Measurements L x W x D (cm) 0.031 N/A N/A Post Debridement Volume: (cm) Scarring: Yes N/A N/A Periwound Skin Texture: No Abnormalities Noted N/A N/A Periwound Skin Moisture: Erythema: Yes N/A N/A Periwound Skin Color: No Abnormality N/A N/A Temperature: Yes N/A N/A Tenderness on Palpation: Debridement N/A N/A Procedures Performed: Treatment Notes Electronic Signature(s) Signed: 08/23/2022 1:28:29 PM By: Cannon, Jennifer MD FACS Entered By: Cannon, Jennifer on 08/23/2022 13:28:29 -------------------------------------------------------------------------------- Multi-Disciplinary Care Plan Details Patient Name: Date of Service: [name], Zachary E. 08/23/2022 12:30 PM Medical Record Number: [id number] Patient Account Number: [id number] Date of Birth/Sex: Treating RN: 01/18/1941 (81 y.o. M) Neal, Zachary Primary Care : Neal, Zachary Other Clinician: Referring : Treating /Extender: Cannon, Jennifer Neal, Zachary Weeks in Treatment: 10 Multidisciplinary Care Plan reviewed with physician Active Inactive Tokarski, Gaylin E ([id number]) [id number] Page 4 of 7 Venous Leg Ulcer Nursing Diagnoses: Potential for venous Insuffiency (use before diagnosis confirmed) Goals: Patient will maintain optimal edema control Date Initiated: 06/29/2022 Target Resolution Date: 11/23/2022 Goal Status: Active Interventions: Assess peripheral edema status every visit. Compression as ordered Treatment Activities: T ordered outside of clinic : 06/29/2022 est Therapeutic compression applied : 06/29/2022 Notes: Wound/Skin Impairment Nursing Diagnoses: Impaired tissue integrity Knowledge deficit related to ulceration/compromised skin integrity Goals: Patient/caregiver will verbalize understanding of skin care regimen Date Initiated: 06/14/2022 Date Inactivated: 08/04/2022 Target Resolution Date:  08/11/2022 Goal Status: Met Ulcer/skin breakdown will have a volume reduction of 30% by week 4 Date Initiated: 06/14/2022 Date Inactivated: 07/14/2022 Target Resolution Date: 07/12/2022 Goal Status: Met Ulcer/skin breakdown will have a volume reduction of 50% by week 8 Date Initiated: 07/14/2022 Target Resolution Date: 11/23/2022 Goal Status: Active Interventions: Assess patient/caregiver ability to obtain necessary supplies Assess patient/caregiver ability to perform ulcer/skin care regimen upon admission and as needed Assess ulceration(s) every visit Provide education on ulcer and skin care Treatment Activities: Skin care regimen initiated : 06/14/2022 Topical wound management initiated : 06/14/2022 Notes: Electronic Signature(s) Signed: 08/23/2022 5:43:15 PM By: Neal, Joanne RN Entered By: Neal, Zachary on 08/23/2022 17:21:14 -------------------------------------------------------------------------------- Pain Assessment Details Patient Name: Date of Service: [name], Zachary E. 08/23/2022 12:30 PM Medical Record Number: [id number] Patient Account Number: [id number] Date of Birth/Sex: Treating RN: 04/23/1941 (81 y.o. M) Neal, Zachary Primary Care : Neal, Zachary Other Clinician: Referring : Treating /Extender: Cannon, Jennifer Neal, Zachary Weeks in Treatment: 10 Active Problems Location of Pain Severity and Description of Pain Patient Has Paino No Site   Hendrum, Taytum E ([id number]) [id number] Page 5 of 7 Pain Management and Medication Current Pain Management: Electronic Signature(s) Signed: 08/23/2022 5:43:15 PM By: Dellie Catholic RN Entered By: Dellie Catholic on 08/23/2022 13:01:39 -------------------------------------------------------------------------------- Patient/Caregiver Education Details Patient Name: Date of Service: Margarette Canada 11/29/2023andnbsp12:30 PM Medical Record  Number: [id number] Patient Account Number: [id number] Date of Birth/Gender: Treating RN: Mar 04, 1941 (81 y.o. Collene Gobble Primary Care Physician: [name] Other Clinician: Referring Physician: Treating Physician/Extender: Lenox Ponds in Treatment: 10 Education Assessment Education Provided To: Patient Education Topics Provided Wound/Skin Impairment: Methods: Explain/Verbal Responses: Return demonstration correctly Electronic Signature(s) Signed: 08/23/2022 5:43:15 PM By: Dellie Catholic RN Entered By: Dellie Catholic on 08/23/2022 17:21:28 -------------------------------------------------------------------------------- Wound Assessment Details Patient Name: Date of Service: Zachary Alto E. 08/23/2022 12:30 PM Medical Record Number: [id number] Patient Account Number: [id number] Date of Birth/Sex: Treating RN: September 06, 1941 (81 y.o. Collene Gobble Primary Care Jayln Branscom: [name] Other Clinician: Referring Patrich Heinze: Treating Carrick Rijos/Extender: Kerney, Hopfensperger ([id number]) [id number] Page 6 of 7 Weeks in Treatment: 10 Wound Status Wound Number: 1 Primary Open Surgical Wound Etiology: Wound Location: Left, Lateral Lower Leg Wound Open Wounding Event: Surgical Injury Status: Date Acquired: 05/09/2022 Comorbid Cataracts, Arrhythmia, Deep Vein Thrombosis, Hypertension, Weeks Of Treatment: 10 History: Osteoarthritis, Neuropathy, Seizure Disorder Clustered Wound: Yes Photos Wound Measurements Length: (cm) Width: (cm) Depth: (cm) Clustered Quantity: Area: (cm) Volume: (cm) 0.5 % Reduction in Area: 98.8% 0.4 % Reduction in Volume: 99.7% 0.2 Epithelialization: Small (1-33%) 3 Tunneling: No 0.157 Undermining: No 0.031 Wound Description Classification: Full Thickness With Exposed Suppor Wound Margin: Distinct, outline attached Exudate Amount: Medium Exudate Type:  Serosanguineous Exudate Color: red, brown t Structures Foul Odor After Cleansing: No Slough/Fibrino Yes Wound Bed Granulation Amount: Large (67-100%) Exposed Structure Granulation Quality: Red, Hyper-granulation Fascia Exposed: No Necrotic Amount: Small (1-33%) Fat Layer (Subcutaneous Tissue) Exposed: Yes Necrotic Quality: Eschar, Adherent Slough Tendon Exposed: No Muscle Exposed: No Joint Exposed: No Bone Exposed: No Periwound Skin Texture Texture Color No Abnormalities Noted: No No Abnormalities Noted: No Scarring: Yes Erythema: Yes Moisture Temperature / Pain No Abnormalities Noted: Yes Temperature: No Abnormality Tenderness on Palpation: Yes Treatment Notes Wound #1 (Lower Leg) Wound Laterality: Left, Lateral Cleanser Peri-Wound Care Sween Lotion (Moisturizing lotion) Discharge Instruction: Apply moisturizing lotion as directed Topical Primary Dressing Hydrofera Blue Ready Foam, 2.5 x2.5 in Discharge Instruction: Apply to wound bed as instructed Secondary Dressing DASHAUN, ONSTOTT ([id number]) [id number] Page 7 of 7 Optifoam Non-Adhesive Dressing, 4x4 in Discharge Instruction: Apply to medial ankle bone to protect from irritation. Woven Gauze Sponge, Non-Sterile 4x4 in Discharge Instruction: Apply over primary dressing as directed. Secured With Compression Wrap Kerlix Roll 4.5x3.1 (in/yd) Discharge Instruction: Apply Kerlix and Coban compression as directed. Coban Self-Adherent Wrap 4x5 (in/yd) Discharge Instruction: Apply over Kerlix as directed. Compression Stockings Add-Ons Electronic Signature(s) Signed: 08/23/2022 5:43:15 PM By: Dellie Catholic RN Entered By: Dellie Catholic on 08/23/2022 12:56:54 -------------------------------------------------------------------------------- Vitals Details Patient Name: Date of Service: Zachary Alto E. 08/23/2022 12:30 PM Medical Record Number: [id number] Patient Account Number:  [id number] Date of Birth/Sex: Treating RN: 11/17/1940 (81 y.o. Collene Gobble Primary Care Karrington Mccravy: [name] Other Clinician: Referring Tzvi Economou: Treating Dannika Hilgeman/Extender: Lenox Ponds in Treatment: 10 Vital Signs Time Taken: 12:50 Temperature (F): 97.4 Height (in): 72 Pulse (bpm): 56 Weight (lbs): 205 Respiratory Rate (breaths/min): 16 Body Mass Index (BMI): 27.8 Blood Pressure (mmHg): 156/73 Reference Range: 80 -  120 mg / dl Electronic Signature(s) Signed: 08/23/2022 5:43:15 PM By: Neal, Joanne RN Entered By: Neal, Zachary on 08/23/2022 12:52:47 

## 2022-08-30 ENCOUNTER — Encounter (HOSPITAL_BASED_OUTPATIENT_CLINIC_OR_DEPARTMENT_OTHER): Payer: Medicare Other | Attending: General Surgery | Admitting: General Surgery

## 2022-08-30 DIAGNOSIS — I10 Essential (primary) hypertension: Secondary | ICD-10-CM | POA: Diagnosis not present

## 2022-08-30 DIAGNOSIS — G40909 Epilepsy, unspecified, not intractable, without status epilepticus: Secondary | ICD-10-CM | POA: Diagnosis not present

## 2022-08-30 DIAGNOSIS — I890 Lymphedema, not elsewhere classified: Secondary | ICD-10-CM | POA: Diagnosis not present

## 2022-08-30 DIAGNOSIS — M1990 Unspecified osteoarthritis, unspecified site: Secondary | ICD-10-CM | POA: Diagnosis not present

## 2022-08-30 DIAGNOSIS — I4891 Unspecified atrial fibrillation: Secondary | ICD-10-CM | POA: Diagnosis not present

## 2022-08-30 DIAGNOSIS — G629 Polyneuropathy, unspecified: Secondary | ICD-10-CM | POA: Insufficient documentation

## 2022-08-30 DIAGNOSIS — L97326 Non-pressure chronic ulcer of left ankle with bone involvement without evidence of necrosis: Secondary | ICD-10-CM | POA: Diagnosis not present

## 2022-08-30 DIAGNOSIS — Z86718 Personal history of other venous thrombosis and embolism: Secondary | ICD-10-CM | POA: Insufficient documentation

## 2022-08-31 NOTE — Progress Notes (Signed)
RODMAN, RECUPERO ([id number]) [id number] Page 1 of 6 Visit Report for 08/30/2022 Arrival Information Details Patient Name: Date of Service: [name], [name] 08/30/2022 12:30 PM Medical Record Number: [id number] Patient Account Number: [id number] Date of Birth/Sex: Treating RN: 1940/11/17 (81 y.o. Dianna Limbo Primary Care Zayah Keilman: Jerl Mina Other Clinician: Referring Mong Neal: Treating Truxton Stupka/Extender: Cinda Quest in Treatment: 11 Visit Information History Since Last Visit Added or deleted any medications: No Patient Arrived: Wheel Chair Any new allergies or adverse reactions: No Arrival Time: 12:41 Had a fall or experienced change in No Accompanied By: spouse activities of daily living that may affect Transfer Assistance: None risk of falls: Patient Identification Verified: Yes Signs or symptoms of abuse/neglect since last visito No Patient Requires Transmission-Based Precautions: No Hospitalized since last visit: No Patient Has Alerts: Yes Implantable device outside of the clinic excluding No Patient Alerts: Patient on Blood Thinner cellular tissue based products placed in the center since last visit: Has Dressing in Place as Prescribed: Yes Has Compression in Place as Prescribed: Yes Pain Present Now: No Electronic Signature(s) Signed: 08/30/2022 4:44:14 PM By: Karie Schwalbe RN Entered By: Karie Schwalbe on 08/30/2022 12:50:47 -------------------------------------------------------------------------------- Encounter Discharge Information Details Patient Name: Date of Service: Zachary Clock E. 08/30/2022 12:30 PM Medical Record Number: [id number] Patient Account Number: [id number] Date of Birth/Sex: Treating RN: April 22, 1941 (81 y.o. Dianna Limbo Primary Care Carol Loftin: Jerl Mina Other Clinician: Referring Kayliegh Boyers: Treating Shamikia Linskey/Extender: Cinda Quest in  Treatment: 11 Encounter Discharge Information Items Post Procedure Vitals Discharge Condition: Stable Temperature (F): 97.8 Ambulatory Status: Wheelchair Pulse (bpm): 68 Discharge Destination: Home Respiratory Rate (breaths/min): 16 Transportation: Private Auto Blood Pressure (mmHg): 141/71 Schedule Follow-up Appointment: Yes Clinical Summary of Care: Patient Declined Electronic Signature(s) Signed: 08/30/2022 4:44:14 PM By: Karie Schwalbe RN Entered By: Karie Schwalbe on 08/30/2022 16:19:43 [name] ([id number]) [id number] Page 2 of 6 -------------------------------------------------------------------------------- Lower Extremity Assessment Details Patient Name: Date of Service: [name], [name] 08/30/2022 12:30 PM Medical Record Number: [id number] Patient Account Number: [id number] Date of Birth/Sex: Treating RN: 04/16/1941 (81 y.o. Dianna Limbo Primary Care Izola Teague: Jerl Mina Other Clinician: Referring Tillie Viverette: Treating Linsy Ehresman/Extender: Cinda Quest in Treatment: 11 Edema Assessment Assessed: Kyra Searles: No] Franne Forts: No] Edema: [Left: Ye] [Right: s] Calf Left: Right: Point of Measurement: From Medial Instep 32 cm Ankle Left: Right: Point of Measurement: From Medial Instep 22 cm Vascular Assessment Pulses: Dorsalis Pedis Palpable: [Left:Yes] Electronic Signature(s) Signed: 08/30/2022 4:44:14 PM By: Karie Schwalbe RN Entered By: Karie Schwalbe on 08/30/2022 12:52:39 -------------------------------------------------------------------------------- Multi Wound Chart Details Patient Name: Date of Service: Zachary Clock E. 08/30/2022 12:30 PM Medical Record Number: [id number] Patient Account Number: [id number] Date of Birth/Sex: Treating RN: 05-31-1941 (81 y.o. M) Primary Care Camar Guyton: Jerl Mina Other Clinician: Referring Sandrika Schwinn: Treating Donathan Buller/Extender: Cinda Quest in Treatment: 11 Vital Signs Height(in): 72 Pulse(bpm): 68 Weight(lbs): 205 Blood Pressure(mmHg): 141/71 Body Mass Index(BMI): 27.8 Temperature(F): 97.8 Respiratory Rate(breaths/min): 16 [1:Photos:] [N/A:N/A] Left, Lateral Lower Leg N/A N/A Wound Location: Surgical Injury N/A N/A Wounding Event: Open Surgical Wound N/A N/A Primary Etiology: Cataracts, Arrhythmia, Deep Vein N/A N/A Comorbid History: Thrombosis, Hypertension, Osteoarthritis, Neuropathy, Seizure Disorder 05/09/2022 N/A N/A Date Acquired: 11 N/A N/A Weeks of Treatment: Healed - Epithelialized N/A N/A Wound Status: No N/A N/A Wound Recurrence: Yes N/A N/A Clustered Wound: 3 N/A N/A Clustered Quantity: 0x0x0 N/A N/A Measurements L x W  x D (cm) 0 N/A N/A A (cm) : rea 0 N/A N/A Volume (cm) : 100.00% N/A N/A % Reduction in A rea: 100.00% N/A N/A % Reduction in Volume: Full Thickness With Exposed Support N/A N/A Classification: Structures None Present N/A N/A Exudate A mount: Distinct, outline attached N/A N/A Wound Margin: None Present (0%) N/A N/A Granulation A mount: None Present (0%) N/A N/A Necrotic A mount: Fascia: No N/A N/A Exposed Structures: Fat Layer (Subcutaneous Tissue): No Tendon: No Muscle: No Joint: No Bone: No Large (67-100%) N/A N/A Epithelialization: Debridement - Selective/Open Wound N/A N/A Debridement: Pre-procedure Verification/Time Out 13:02 N/A N/A Taken: Lidocaine 5% topical ointment N/A N/A Pain Control: Slough N/A N/A Tissue Debrided: Non-Viable Tissue N/A N/A Level: 0.09 N/A N/A Debridement A (sq cm): rea Curette N/A N/A Instrument: Minimum N/A N/A Bleeding: Pressure N/A N/A Hemostasis A chieved: 0 N/A N/A Procedural Pain: 0 N/A N/A Post Procedural Pain: Procedure was tolerated well N/A N/A Debridement Treatment Response: 0.3x0.3x0.1 N/A N/A Post Debridement Measurements L x W x D (cm) 0.007 N/A N/A Post Debridement Volume:  (cm) Excoriation: No N/A N/A Periwound Skin Texture: Induration: No Callus: No Crepitus: No Rash: No Scarring: No Maceration: No N/A N/A Periwound Skin Moisture: Dry/Scaly: No Atrophie Blanche: No N/A N/A Periwound Skin Color: Cyanosis: No Ecchymosis: No Erythema: No Hemosiderin Staining: No Mottled: No Pallor: No Rubor: No No Abnormality N/A N/A Temperature: Yes N/A N/A Tenderness on Palpation: Debridement N/A N/A Procedures Performed: Treatment Notes Electronic Signature(s) Signed: 08/30/2022 1:19:44 PM By: Duanne Guess MD FACS Entered By: Duanne Guess on 08/30/2022 13:19:44 Multi-Disciplinary Care Plan Details -------------------------------------------------------------------------------- [name] ([id number]) [id number] Page 4 of 6 Patient Name: Date of Service: [name], [name] 08/30/2022 12:30 PM Medical Record Number: [id number] Patient Account Number: [id number] Date of Birth/Sex: Treating RN: 1941/01/30 (81 y.o. Dianna Limbo Primary Care Natia Fahmy: Jerl Mina Other Clinician: Referring Heyli Min: Treating Ralyn Stlaurent/Extender: Cinda Quest in Treatment: 11 Multidisciplinary Care Plan reviewed with physician Active Inactive Electronic Signature(s) Signed: 08/30/2022 4:44:14 PM By: Karie Schwalbe RN Entered By: Karie Schwalbe on 08/30/2022 16:21:12 -------------------------------------------------------------------------------- Pain Assessment Details Patient Name: Date of Service: Zachary Clock E. 08/30/2022 12:30 PM Medical Record Number: [id number] Patient Account Number: [id number] Date of Birth/Sex: Treating RN: 11-Feb-1941 (81 y.o. Dianna Limbo Primary Care Berma Harts: Jerl Mina Other Clinician: Referring Tauren Delbuono: Treating Jigar Zielke/Extender: Cinda Quest in Treatment: 11 Active Problems Location of Pain Severity and Description  of Pain Patient Has Paino No Site Locations Pain Management and Medication Current Pain Management: Electronic Signature(s) Signed: 08/30/2022 4:44:14 PM By: Karie Schwalbe RN Entered By: Karie Schwalbe on 08/30/2022 12:52:05 [name] ([id number]) [id number] Page 5 of 6 -------------------------------------------------------------------------------- Patient/Caregiver Education Details Patient Name: Date of Service: [name], [name] 12/6/2023andnbsp12:30 PM Medical Record Number: [id number] Patient Account Number: [id number] Date of Birth/Gender: Treating RN: 06/06/1941 (81 y.o. Dianna Limbo Primary Care Physician: Jerl Mina Other Clinician: Referring Physician: Treating Physician/Extender: Cinda Quest in Treatment: 11 Education Assessment Education Provided To: Patient Education Topics Provided Wound/Skin Impairment: Methods: Explain/Verbal Responses: Return demonstration correctly Electronic Signature(s) Signed: 08/30/2022 4:44:14 PM By: Karie Schwalbe RN Entered By: Karie Schwalbe on 08/30/2022 16:15:05 -------------------------------------------------------------------------------- Wound Assessment Details Patient Name: Date of Service: Zachary Clock E. 08/30/2022 12:30 PM Medical Record Number: [id number] Patient Account Number: [id number] Date of Birth/Sex: Treating RN: 28-Sep-1940 (81 y.o. Dianna Limbo Primary Care Jernie Schutt: Burnett Sheng,  Fayrene Fearing Other Clinician: Referring Kizzy Olafson: Treating Salihah Peckham/Extender: Cinda Quest in Treatment: 11 Wound Status Wound Number: 1 Primary Open Surgical Wound Etiology: Wound Location: Left, Lateral Lower Leg Wound Healed - Epithelialized Wounding Event: Surgical Injury Status: Date Acquired: 05/09/2022 Comorbid Cataracts, Arrhythmia, Deep Vein Thrombosis, Hypertension, Weeks Of Treatment: 11 History: Osteoarthritis,  Neuropathy, Seizure Disorder Clustered Wound: Yes Photos Wound Measurements Length: (cm) Width: (cm) Depth: (cm) Clustered Quantity: Area: (cm) Volume: (cm) 0 % Reduction in Area: 100% 0 % Reduction in Volume: 100% 0 Epithelialization: Large (67-100%) 3 Tunneling: No 0 Undermining: No 0 Wound Description [name], [name] ([id number]) Classification: Full Thickness With Exposed Support Structures Wound Margin: Distinct, outline attached Exudate Amount: None Present [id number] Page 6 of 6 Foul Odor After Cleansing: No Slough/Fibrino No Wound Bed Granulation Amount: None Present (0%) Exposed Structure Necrotic Amount: None Present (0%) Fascia Exposed: No Fat Layer (Subcutaneous Tissue) Exposed: No Tendon Exposed: No Muscle Exposed: No Joint Exposed: No Bone Exposed: No Periwound Skin Texture Texture Color No Abnormalities Noted: Yes No Abnormalities Noted: Yes Moisture Temperature / Pain No Abnormalities Noted: Yes Temperature: No Abnormality Tenderness on Palpation: Yes Electronic Signature(s) Signed: 08/30/2022 4:44:14 PM By: Karie Schwalbe RN Entered By: Karie Schwalbe on 08/30/2022 13:08:40 -------------------------------------------------------------------------------- Vitals Details Patient Name: Date of Service: Zachary Clock E. 08/30/2022 12:30 PM Medical Record Number: [id number] Patient Account Number: [id number] Date of Birth/Sex: Treating RN: [birth year] (81 y.o. Dianna Limbo Primary Care Remmi Armenteros: Jerl Mina Other Clinician: Referring Jakim Drapeau: Treating Oasis Goehring/Extender: Cinda Quest in Treatment: 11 Vital Signs Time Taken: 12:43 Temperature (F): 97.8 Height (in): 72 Pulse (bpm): 68 Weight (lbs): 205 Respiratory Rate (breaths/min): 16 Body Mass Index (BMI): 27.8 Blood Pressure (mmHg): 141/71 Reference Range: 80 - 120 mg / dl Electronic Signature(s) Signed: 08/30/2022  4:44:14 PM By: Karie Schwalbe RN Entered By: Karie Schwalbe on 08/30/2022 12:51:21

## 2022-08-31 NOTE — Progress Notes (Signed)
[name] ([id number]) [id number] Page 1 of 8 Visit Report for 08/30/2022 Chief Complaint Document Details Patient Name: Date of Service: Zachary, Neal 08/30/2022 12:30 PM Medical Record Number: [id number] Patient Account Number: [id number] Date of Birth/Sex: Treating RN: 1941/01/12 (81 y.o. M) Primary Care Provider: Jerl Mina Other Clinician: Referring Provider: Treating Provider/Extender: Cinda Quest in Treatment: 11 Information Obtained from: Patient Chief Complaint Patient presents to the wound care center with open non-healing surgical wound(s) Electronic Signature(s) Signed: 08/30/2022 1:19:52 PM By: Duanne Guess MD FACS Entered By: Duanne Guess on 08/30/2022 13:19:52 -------------------------------------------------------------------------------- Debridement Details Patient Name: Date of Service: Zachary Clock E. 08/30/2022 12:30 PM Medical Record Number: [id number] Patient Account Number: [id number] Date of Birth/Sex: Treating RN: 27-Aug-1941 (81 y.o. Dianna Limbo Primary Care Provider: Jerl Mina Other Clinician: Referring Provider: Treating Provider/Extender: Cinda Quest in Treatment: 11 Debridement Performed for Assessment: Wound #1 Left,Lateral Lower Leg Performed By: Physician Duanne Guess, MD Debridement Type: Debridement Level of Consciousness (Pre-procedure): Awake and Alert Pre-procedure Verification/Time Out Yes - 13:02 Taken: Start Time: 13:02 Pain Control: Lidocaine 5% topical ointment T Area Debrided (L x W): otal 0.3 (cm) x 0.3 (cm) = 0.09 (cm) Tissue and other material debrided: Non-Viable, Slough, Slough Level: Non-Viable Tissue Debridement Description: Selective/Open Wound Instrument: Curette Bleeding: Minimum Hemostasis Achieved: Pressure End Time: 13:03 Procedural Pain: 0 Post Procedural Pain: 0 Response to Treatment:  Procedure was tolerated well Level of Consciousness (Post- Awake and Alert procedure): Post Debridement Measurements of Total Wound Length: (cm) 0.3 Width: (cm) 0.3 Depth: (cm) 0.1 Volume: (cm) 0.007 Character of Wound/Ulcer Post Debridement: Improved Post Procedure Diagnosis Zachary, Neal ([id number]) [id number] Page 2 of 8 Same as Pre-procedure Notes Scribed by Dr. Lady Gary by J.Scotton Electronic Signature(s) Signed: 08/30/2022 4:44:14 PM By: Karie Schwalbe RN Signed: 08/30/2022 5:07:18 PM By: Duanne Guess MD FACS Entered By: Karie Schwalbe on 08/30/2022 13:14:48 -------------------------------------------------------------------------------- HPI Details Patient Name: Date of Service: Zachary Clock E. 08/30/2022 12:30 PM Medical Record Number: [id number] Patient Account Number: [id number] Date of Birth/Sex: Treating RN: 05/03/1941 (81 y.o. M) Primary Care Provider: Jerl Mina Other Clinician: Referring Provider: Treating Provider/Extender: Cinda Quest in Treatment: 11 History of Present Illness HPI Description: ADMISSION 06/14/2022 This is an 57 suffered a left bimalleolar ankle fracture in November 2022. He underwent ORIF of the fracture. His postoperative course was complicated by areas of wound dehiscence. On May 09, 2022, he was taken to the operating room by orthopedics again for hardware removal. Cultures taken from the operating room demonstrated methicillin sensitive Staph aureus. He is currently taking doxycycline. He is not diabetic, nor does he smoke. ABI in clinic today was 0.86; he is going to have formal ABIs performed this Friday at the vascular surgery clinic in [place]. There is a linear wound running down his lateral left ankle. There are interspersed bands of epithelium. The wound does probe to bone over the malleolus. There is some slough accumulation but also some good granulation  tissue filling in the wound bed. There is a single nylon suture remaining in place. 06/21/2022: The wound measured a little bit smaller today. There is good granulation tissue filling in from the depths of the wound. There is a small area where bone is still accessible with a probe. Minimal slough and biofilm accumulation. He has completed his course of oral antibiotics. He did have formal ABIs performed on Friday and they were normal  bilaterally. 06/29/2022: The wound continues to contract. The more proximal and distal extensions of the incision have nearly closed. The main open portion of the wound continues to fill with granulation tissue. I am still able to probe to bone in 1 small area, but overall there has been good improvement. 10/13; the wound continues to contract. Healthy granulation. The deepest parts of the distal wound are actually in the mid aspect. Smaller areas superiorly look superficial. 10/20; minimally smaller. Using silver alginate. 07/21/2022: The wound continues to contract. The bone is completely covered. Good granulation tissue. 07/28/2022: The wound has contracted further. There is a healthy bed of granulation tissue present. Just a little bit of light slough accumulation. 08/04/2022: The wound is smaller again today. Good granulation tissue present with just a little bit of slough and eschar. 08/11/2022: The wound is smaller again this week. The granulation tissue is a bit hypertrophic. No concern for infection. 08/23/2022: His wound is down to about half a centimeter. It is clean with just some eschar around the edges. 08/30/2022: After I removed a layer of eschar, I found that his wound has healed. Electronic Signature(s) Signed: 08/30/2022 1:20:28 PM By: Duanne Guess MD FACS Entered By: Duanne Guess on 08/30/2022 13:20:28 -------------------------------------------------------------------------------- Physical Exam Details Patient Name: Date of Service: Zachary Clock E. 08/30/2022 12:30 PM Medical Record Number: [id number] Patient Account Number: [id number] Zachary, Neal ([id number]) [id number] Page 3 of 8 Date of Birth/Sex: Treating RN: [birth year] (81 y.o. M) Primary Care Provider: Jerl Mina Other Clinician: Referring Provider: Treating Provider/Extender: Cinda Quest in Treatment: 11 Constitutional Slightly hypertensive. . . . No acute distress. Respiratory Normal work of breathing on room air. Notes 08/30/2022: The wound site was covered with a layer of eschar. Once I removed this, I discovered that the wound had healed completely. Electronic Signature(s) Signed: 08/30/2022 1:27:14 PM By: Duanne Guess MD FACS Entered By: Duanne Guess on 08/30/2022 13:27:14 -------------------------------------------------------------------------------- Physician Orders Details Patient Name: Date of Service: Zachary Clock E. 08/30/2022 12:30 PM Medical Record Number: [id number] Patient Account Number: [id number] Date of Birth/Sex: Treating RN: 28-Dec-1940 (81 y.o. 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 Your wound is healed! Edema Control - Lymphedema / SCD / Other Avoid standing for long periods of time. Exercise regularly Electronic Signature(s) Signed: 08/30/2022 1:27:24 PM By: Duanne Guess MD FACS Entered By: Duanne Guess on 08/30/2022  13:27:24 -------------------------------------------------------------------------------- Problem List Details Patient Name: Date of Service: Zachary Clock E. 08/30/2022 12:30 PM Medical Record Number: [id number] Patient Account Number: [id number] Date of Birth/Sex: Treating RN: 1941/01/16 (81 y.o. 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 08/30/2022 12:30 PM Medical Record Number: [id number] Patient Account Number: [id number] Date of Birth/Sex: Treating RN: 02-16-1941 (81 y.o. M) Primary Care Provider: Jerl MinaHedrick, James Other Clinician: Referring Provider: Treating Provider/Extender: Cinda Questannon, Emmajo Bennette Hedrick, James Weeks in Treatment: 11 Subjective Chief Complaint Information obtained from Patient Patient presents to the wound care center with open non-healing surgical wound(s) History of Present Illness (HPI) ADMISSION 06/14/2022 This is an 81 suffered a left bimalleolar ankle fracture in November 2022. He underwent ORIF of the fracture. His postoperative course was complicated by areas of wound  dehiscence. On May 09, 2022, he was taken to the operating room by orthopedics again for hardware removal. Cultures taken from the operating room demonstrated methicillin sensitive Staph aureus. He is currently taking doxycycline. He is not diabetic, nor does he smoke. ABI in clinic today was 0.86; he is going to have formal ABIs performed this Friday at the vascular surgery clinic in [place]. There is a linear wound running down his lateral left ankle. There are interspersed bands of epithelium. The wound does probe to bone over the malleolus. There is some slough accumulation but also some good granulation tissue filling in the wound bed. There is a single nylon suture remaining in place. 06/21/2022: The wound measured a little bit smaller today. There is good granulation tissue filling in from the depths of the wound. There is a small area where bone is still accessible with a probe. Minimal slough and biofilm accumulation. He has completed his course of oral antibiotics. He did have formal ABIs performed on Friday and they were normal bilaterally. 06/29/2022: The wound continues to contract. The more proximal and distal extensions of the incision have nearly closed. The main open portion of the wound continues to fill with granulation tissue. I am still able to probe to bone in 1 small area, but overall there has been good improvement. 10/13; the wound continues to contract. Healthy granulation. The deepest parts of the distal wound are actually in the mid aspect. Smaller areas superiorly look superficial. 10/20; minimally smaller. Using silver alginate. 07/21/2022: The wound continues to contract. The bone is completely covered. Good granulation tissue. 07/28/2022: The wound has contracted further. There is a healthy bed of granulation tissue present. Just a little bit of light slough accumulation. 08/04/2022: The wound is smaller again today. Good granulation tissue present with just a little  bit of slough and eschar. Zachary DeforestVUNCANNON, Pranav E ([id number]) [id number] Page 5 of 8 08/11/2022: The wound is smaller again this week. The granulation tissue is a bit hypertrophic. No concern for infection. 08/23/2022: His wound is down to about half a centimeter. It is clean with just some eschar around the edges. 08/30/2022: After I removed a layer of eschar, I found that his wound has healed. Patient History Information obtained from Patient, Chart. Family History Heart Disease - Mother, Hypertension - Father, No family history of Cancer, Diabetes, Hereditary Spherocytosis, Kidney Disease, Lung Disease, Seizures, Stroke, Thyroid Problems, Tuberculosis. Social History Never smoker, Marital Status - Married, Alcohol Use - Never, Drug Use - No History, Caffeine Use - Daily - coffee. Medical History Eyes Patient has history of Cataracts - right eye extraction Denies history of Glaucoma, Optic Neuritis Cardiovascular Patient has history of Arrhythmia - afib, Deep Vein Thrombosis - left leg, Hypertension Endocrine Denies history of Type I Diabetes,  Type II Diabetes Genitourinary Denies history of End Stage Renal Disease Integumentary (Skin) Denies history of History of Burn Musculoskeletal Patient has history of Osteoarthritis Denies history of Osteomyelitis Neurologic Patient has history of Neuropathy, Seizure Disorder - hx Oncologic Denies history of Received Chemotherapy, Received Radiation Psychiatric Denies history of Anorexia/bulimia, Confinement Anxiety Medical A Surgical History Notes nd Cardiovascular hyperlipidemia Gastrointestinal hx GI bleed Genitourinary hx pyelonephritis Neurologic hx cerebral hemorrhage Objective Constitutional Slightly hypertensive. No acute distress. Vitals Time Taken: 12:43 PM, Height: 72 in, Weight: 205 lbs, BMI: 27.8, Temperature: 97.8 F, Pulse: 68 bpm, Respiratory Rate: 16 breaths/min, Blood Pressure: 141/71  mmHg. Respiratory Normal work of breathing on room air. General Notes: 08/30/2022: The wound site was covered with a layer of eschar. Once I removed this, I discovered that the wound had healed completely. Integumentary (Hair, Skin) Wound #1 status is Healed - Epithelialized. Original cause of wound was Surgical Injury. The date acquired was: 05/09/2022. The wound has been in treatment 11 weeks. The wound is located on the Left,Lateral Lower Leg. The wound measures 0cm length x 0cm width x 0cm depth; 0cm^2 area and 0cm^3 volume. There is no tunneling or undermining noted. There is a none present amount of drainage noted. The wound margin is distinct with the outline attached to the wound base. There is no granulation within the wound bed. There is no necrotic tissue within the wound bed. The periwound skin appearance had no abnormalities noted for texture. The periwound skin appearance had no abnormalities noted for moisture. The periwound skin appearance had no abnormalities noted for color. Periwound temperature was noted as No Abnormality. The periwound has tenderness on palpation. 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 There was a Selective/Open Wound Non-Viable Tissue Debridement with a total area of 0.09 sq cm performed by Duanne Guess, MD. With the following instrument(s): Curette to remove Non-Viable tissue/material. Material removed includes [facility] after achieving pain control using Lidocaine 5% topical ointment. No specimens were taken. A time out was conducted at 13:02, prior to  the start of the procedure. A Minimum amount of bleeding was controlled with Pressure. The procedure was tolerated well with a pain level of 0 throughout and a pain level of 0 following the procedure. Post Debridement Measurements: 0.3cm length x 0.3cm width x 0.1cm depth; 0.007cm^3 volume. Character of Wound/Ulcer Post Debridement is improved. Post procedure Diagnosis Wound #1: Same as Pre-Procedure General Notes: Scribed by Dr. Lady Gary by J.Scotton. Plan Discharge From [facility] Services: Discharge from Wound Care Center - Congratulations! Your wound is healed! Edema Control - Lymphedema / SCD / Other: Avoid standing for long periods of time. Exercise regularly 08/30/2022: The wound was covered with a layer of eschar. I used a curette to debride this and underneath, the wound was found to be completely healed. I did recommend that he keep the area protected with a light dressing for the next 2 to 3 weeks to avoid traumatic reopening of the site, should he strike it on a hard surface, for example. We will discharge him from the wound care center. He may follow-up as needed. Electronic Signature(s) Signed: 08/30/2022 1:28:45 PM By: Duanne Guess MD FACS Entered By: Duanne Guess on 08/30/2022  13:28:45 -------------------------------------------------------------------------------- HxROS Details Patient Name: Date of Service: Zachary, Neal 08/30/2022 12:30 PM Medical Record Number: [id number] Patient Account Number: [id number] Date of Birth/Sex: Treating RN: [birth year] (81 y.o. M) Primary Care Provider: Jerl Mina Other Clinician: Referring Provider: Treating Provider/Extender: Cinda Quest in Treatment: 11 Information Obtained From Patient Chart Eyes Medical History: Positive for: Cataracts - right eye extraction Negative for: Glaucoma; Optic Neuritis Cardiovascular Medical History: Positive for: Arrhythmia - afib; Deep Vein Thrombosis - left leg;  Hypertension Past Medical History Notes: hyperlipidemia Gastrointestinal Zachary, Neal ([id number]) [id number] Page 7 of 8 Medical History: Past Medical History Notes: hx GI bleed Endocrine Medical History: Negative for: Type I Diabetes; Type II Diabetes Genitourinary Medical History: Negative for: End Stage Renal Disease Past Medical History Notes: hx pyelonephritis Integumentary (Skin) Medical History: Negative for: History of Burn Musculoskeletal Medical History: Positive for: Osteoarthritis Negative for: Osteomyelitis Neurologic Medical History: Positive for: Neuropathy; Seizure Disorder - hx Past Medical History Notes: hx cerebral hemorrhage Oncologic Medical History: Negative for: Received Chemotherapy; Received Radiation Psychiatric Medical History: Negative for: Anorexia/bulimia; Confinement Anxiety HBO Extended History Items Eyes: Cataracts Immunizations Pneumococcal Vaccine: Received Pneumococcal Vaccination: Yes Received Pneumococcal Vaccination On or After 60th Birthday: Yes Implantable Devices None Family and Social History Cancer: No; Diabetes: No; Heart Disease: Yes - Mother; Hereditary Spherocytosis: No; Hypertension: Yes - Father; Kidney Disease: No; Lung Disease: No; Seizures: No; Stroke: No; Thyroid Problems: No; Tuberculosis: No; Never smoker; Marital Status - Married; Alcohol Use: Never; Drug Use: No History; Caffeine Use: Daily - coffee; Financial Concerns: No; Food, Clothing or Shelter Needs: No; Support System Lacking: No; Transportation Concerns: Yes - caregiver provides rides in am Electronic Signature(s) Signed: 08/30/2022 5:07:18 PM By: Duanne Guess MD FACS Entered By: Duanne Guess on 08/30/2022 13:26:18 SuperBill Details -------------------------------------------------------------------------------- [name] ([id number]) [id number] Page 8 of 8 Patient Name:  Date of Service: Zachary, Neal 08/30/2022 Medical Record Number: [id number] Patient Account Number: [id number] Date of Birth/Sex: Treating RN: 28-Jun-1941 (81 y.o. 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 Physician Procedures Quantity CPT4 Code Description Modifier [id number] - WC PHYS LEVEL 3 - EST PT 1 ICD-10 Diagnosis Description 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

## 2022-09-06 ENCOUNTER — Encounter (HOSPITAL_BASED_OUTPATIENT_CLINIC_OR_DEPARTMENT_OTHER): Payer: Medicare Other | Admitting: Internal Medicine

## 2022-10-18 DIAGNOSIS — Z961 Presence of intraocular lens: Secondary | ICD-10-CM | POA: Diagnosis not present

## 2022-12-27 DIAGNOSIS — I2089 Other forms of angina pectoris: Secondary | ICD-10-CM | POA: Diagnosis not present

## 2022-12-27 DIAGNOSIS — I4821 Permanent atrial fibrillation: Secondary | ICD-10-CM | POA: Diagnosis not present

## 2022-12-27 DIAGNOSIS — I10 Essential (primary) hypertension: Secondary | ICD-10-CM | POA: Diagnosis not present

## 2022-12-27 DIAGNOSIS — E785 Hyperlipidemia, unspecified: Secondary | ICD-10-CM | POA: Diagnosis not present

## 2023-02-09 ENCOUNTER — Ambulatory Visit (INDEPENDENT_AMBULATORY_CARE_PROVIDER_SITE_OTHER): Payer: Medicare Other

## 2023-02-09 ENCOUNTER — Ambulatory Visit (INDEPENDENT_AMBULATORY_CARE_PROVIDER_SITE_OTHER): Payer: Medicare Other | Admitting: Vascular Surgery

## 2023-02-09 VITALS — BP 173/93 | HR 57 | Resp 16 | Ht 72.0 in

## 2023-02-09 DIAGNOSIS — I7025 Atherosclerosis of native arteries of other extremities with ulceration: Secondary | ICD-10-CM

## 2023-02-09 DIAGNOSIS — I10 Essential (primary) hypertension: Secondary | ICD-10-CM

## 2023-02-09 NOTE — Assessment & Plan Note (Signed)
blood pressure control important in reducing the progression of atherosclerotic disease. On appropriate oral medications.  

## 2023-02-09 NOTE — Assessment & Plan Note (Signed)
ABIs today are 1.05 on the right and 0.97 on the left with digit pressures of 153 on the right and 136 on the left.  Continue Eliquis and statin agent.  Follow-up in 6 months with noninvasive studies.

## 2023-02-09 NOTE — Progress Notes (Signed)
MRN : [id number]  [name] is a 82 y.o. (1941-02-10) male who presents with chief complaint of  Chief Complaint  Patient presents with   Follow-up  .  History of Present Illness: Patient returns today in follow up of his PAD.  He has previously undergone revascularization of the left lower extremity for limb salvage with ulceration.  His wounds are healed.  He has no further wounds or infections at this point.  He does not have rest pain.  He really does not have claudication symptoms.  ABIs today are 1.05 on the right and 0.97 on the left with digit pressures of 153 on the right and 136 on the left.  Current Outpatient Medications  Medication Sig Dispense Refill   acetaminophen (TYLENOL) 325 MG tablet Take 1-2 tablets (325-650 mg total) by mouth every 6 (six) hours as needed for mild pain (pain score 1-3 or temp > 100.5).     aliskiren (TEKTURNA) 300 MG tablet Take 300 mg by mouth daily.      apixaban (ELIQUIS) 2.5 MG TABS tablet Take 2.5 mg by mouth 2 (two) times daily.     carbamazepine (TEGRETOL) 200 MG tablet Take 200 mg by mouth 2 (two) times a day.      furosemide (LASIX) 20 MG tablet Take 20 mg by mouth 2 (two) times daily.     irbesartan (AVAPRO) 300 MG tablet Take 300 mg by mouth daily.     pantoprazole (PROTONIX) 40 MG tablet Take 40 mg by mouth at bedtime.      simvastatin (ZOCOR) 20 MG tablet Take 20 mg by mouth 2 (two) times daily.      solifenacin (VESICARE) 5 MG tablet Take 5 mg by mouth daily.     No current facility-administered medications for this visit.    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 Jerl Mina, [facility] in [place]   Intracranial bleed [facility])    december 2010   Left leg DVT (HCC) 09/2019   Migraines    "2 in my life" (09/17/2012)   Mitral regurgitation    a. 01/2019 Echo: EF 55-60%, mild conc LVH. RVSP 59.52mmHg. Mildly dil LA. Mild to mod MR. Mild AI.   PAH (pulmonary artery hypertension) (HCC)    Permanent atrial fibrillation (HCC)    a. CHA2DS2VASc = 5-->No OAC 2/2 h/o SAH; b. 03/2019 Zio: permanent Afib - 78 bpm (46-135). Occas PVCs (1.4% burden).   Retina disorder    hx of torn retina   Stroke (HCC) 2002   LLE "just a little bit weaker" (09/17/2012)   Subarachnoid hemorrhage (HCC) 08/2009    Past Surgical History:  Procedure Laterality Date   BLEPHAROPLASTY  1990's   "right eye" (09/16/2012)   CARDIOVASCULAR STRESS TEST     nl perfusion w/o ischemia, EF 56%, bradycardia without syncope 04/15/10 (Dr. Harold Hedge)   CATARACT EXTRACTION W/ INTRAOCULAR LENS IMPLANT  1980's   "  right" (09/17/2012)   COLONOSCOPY     06/26/09   EYE MUSCLE SURGERY  1980's   "right eye" (09/17/2012)   HARDWARE REMOVAL Left 05/09/2022   Procedure: HARDWARE REMOVAL LEFT ANKLE;  Surgeon: Juanell Fairly, MD;  Location: [facility] ORS;  Service: Orthopedics;  Laterality: Left;   INGUINAL HERNIA REPAIR  04/10/2011   "right" (09/17/2012)   JOINT REPLACEMENT     ORIF ANKLE FRACTURE Left 08/19/2021   Procedure: OPEN REDUCTION INTERNAL FIXATION (ORIF) ANKLE FRACTURE;  Surgeon: Juanell Fairly, MD;  Location: [facility] ORS;  Service: Orthopedics;  Laterality: Left;   PERIPHERAL VASCULAR THROMBECTOMY Left 10/27/2019   Procedure: PERIPHERAL VASCULAR THROMBECTOMY;  Surgeon: Annice Needy, MD;  Location: [facility] INVASIVE CV LAB;  Service: Cardiovascular;  Laterality: Left;   POSTERIOR LUMBAR FUSION  09/16/2012   "L4-5" (09/17/2012)   RETINAL DETACHMENT SURGERY  1980's    "right" (09/17/2012)   TOTAL KNEE ARTHROPLASTY  04/28/2010   "left" (09/17/2012)     Social History   Tobacco Use   Smoking status: Never   Smokeless tobacco: Never  Vaping Use   Vaping Use: Never used  Substance Use Topics   Alcohol use: No   Drug use: No       Family History  Problem Relation Age of Onset   Heart disease Mother      Allergies  Allergen Reactions   Codeine Nausea Only and Other (See Comments)   Fentanyl Other (See Comments)    Given before knee surgery, blood pressure became extremely low   Regadenoson Other (See Comments)    Created cardiac arrest   Sulfamethoxazole-Trimethoprim Hives   Verapamil Other (See Comments)    Per patient: Medication has caused low heart rate and caused patient to have afib that he currently has   Acetaminophen Other (See Comments)    Pt states he feels hot when he takes acetaminophen   Amlodipine Besylate Other (See Comments)    Low heart rate   Doxycycline Rash    REVIEW OF SYSTEMS (Negative unless checked)   Constitutional: [] Weight loss  [] Fever  [] Chills Cardiac: [] Chest pain   [] Chest pressure   [x] Palpitations   [] Shortness of breath when laying flat   [] Shortness of breath at rest   [x] Shortness of breath with exertion. Vascular:  [] Pain in legs with walking   [] Pain in legs at rest   [] Pain in legs when laying flat   [] Claudication   [] Pain in feet when walking  [] Pain in feet at rest  [] Pain in feet when laying flat   [] History of DVT   [] Phlebitis   [x] Swelling in legs   [] Varicose veins   [x] Non-healing ulcers Pulmonary:   [] Uses home oxygen   [] Productive cough   [] Hemoptysis   [] Wheeze  [] COPD   [] Asthma Neurologic:  [] Dizziness  [] Blackouts   [] Seizures   [] History of stroke   [] History of TIA  [] Aphasia   [] Temporary blindness   [] Dysphagia   [] Weakness or numbness in arms   [] Weakness or numbness in legs Musculoskeletal:  [] Arthritis   [] Joint swelling   [] Joint pain   [] Low back pain Hematologic:   [] Easy bruising  [] Easy bleeding   [] Hypercoagulable state   [] Anemic   Gastrointestinal:  [] Blood in stool   [] Vomiting blood  [] Gastroesophageal reflux/heartburn   [] Abdominal pain Genitourinary:  [] Chronic kidney disease   [] Difficult urination  [] Frequent urination  [] Burning with urination   [] Hematuria Skin:  [] Rashes   [x] Ulcers   [x] Wounds Psychological:  [] History of anxiety   []   History of major depression.  Physical Examination  BP (!) 173/93 (BP Location: Left Arm)   Pulse (!) 57   Resp 16   Ht 6' (1.829 m)   BMI 28.21 kg/m  Gen:  WD/WN, NAD Head: [place]/AT, No temporalis wasting. Ear/Nose/Throat: Hearing diminished, nares w/o erythema or drainage Eyes: Conjunctiva clear. Sclera non-icteric Neck: Supple.  Trachea midline Pulmonary:  Good air movement, no use of accessory muscles.  Cardiac: irregular Vascular:  Vessel Right Left  Radial Palpable Palpable                          PT 1+ Palpable 1+ Palpable  DP 1+ Palpable 1+ Palpable   Gastrointestinal: soft, non-tender/non-distended. No guarding/reflex.  Musculoskeletal: M/S 5/5 throughout.  No deformity or atrophy. Mild LE edema. Neurologic: Sensation grossly intact in extremities.  Symmetrical.  Speech is fluent.  Psychiatric: Judgment intact, Mood & affect appropriate for pt's clinical situation. Dermatologic: No rashes or ulcers noted.  No cellulitis or open wounds.      Labs No results found for this or any previous visit (from the past 2160 hour(s)).  Radiology No results found.  Assessment/Plan  Hypertension blood pressure control important in reducing the progression of atherosclerotic disease. On appropriate oral medications.   Atherosclerosis of native arteries of the extremities with ulceration (HCC) ABIs today are 1.05 on the right and 0.97 on the left with digit pressures of 153 on the right and 136 on the left.  Continue Eliquis and statin agent.  Follow-up in 6 months with noninvasive  studies.    Festus Barren, MD  02/09/2023 2:24 PM    This note was created with Dragon medical transcription system.  Any errors from dictation are purely unintentional

## 2023-02-12 LAB — VAS US ABI WITH/WO TBI
Left ABI: 0.97
Right ABI: 1.05

## 2023-08-08 ENCOUNTER — Ambulatory Visit (INDEPENDENT_AMBULATORY_CARE_PROVIDER_SITE_OTHER): Payer: Medicare Other | Admitting: Nurse Practitioner

## 2023-08-08 ENCOUNTER — Encounter (INDEPENDENT_AMBULATORY_CARE_PROVIDER_SITE_OTHER): Payer: Medicare Other

## 2024-01-30 ENCOUNTER — Ambulatory Visit: Admitting: Urology

## 2024-01-30 ENCOUNTER — Encounter: Payer: Self-pay | Admitting: Urology

## 2024-01-30 VITALS — BP 126/80 | HR 76 | Ht 72.0 in | Wt 208.0 lb

## 2024-01-30 DIAGNOSIS — R350 Frequency of micturition: Secondary | ICD-10-CM | POA: Diagnosis not present

## 2024-01-30 DIAGNOSIS — R351 Nocturia: Secondary | ICD-10-CM | POA: Diagnosis not present

## 2024-01-30 LAB — URINALYSIS, COMPLETE
Bilirubin, UA: NEGATIVE
Glucose, UA: NEGATIVE
Ketones, UA: NEGATIVE
Leukocytes,UA: NEGATIVE
Nitrite, UA: NEGATIVE
Protein,UA: NEGATIVE
Specific Gravity, UA: 1.015 (ref 1.005–1.030)
Urobilinogen, Ur: 0.2 mg/dL (ref 0.2–1.0)
pH, UA: 6 (ref 5.0–7.5)

## 2024-01-30 LAB — BLADDER SCAN AMB NON-IMAGING: Scan Result: 34

## 2024-01-30 LAB — MICROSCOPIC EXAMINATION: Bacteria, UA: NONE SEEN

## 2024-01-30 NOTE — Progress Notes (Signed)
 I, [name], acting as a scribe for Zachary Knapp, MD., have documented all relevant documentation on the behalf of Zachary Knapp, MD, as directed by Zachary Knapp, MD while in the presence of Zachary Knapp, MD.  01/30/2024 3:12 PM   [name] [birth year] [id number]  Referring provider: Lyle San, MD [address] [facility] [place],  [place] [address]  Chief Complaint  Patient presents with   Other    nocturia    HPI: [name] is a 83 y.o. male referred for evaluation of urinary frequency and nocturia.   States he has had a several year history of daytime urinary frequency, estimating he voids every 2.5 hours. Also has nocturia and gets up on average Q2.5 hours at night. He occasionally will have episodes where he gets up hourly. Denies urinary incontinence. He is able to fall back asleep.  He states his symptoms have not worsened over the last several years, though was recently interested in treatment. He was prescribed doxazosin  by Dr. Dean Every and saw no significant improvement in his symptoms. He was subsequently given a trial of solifenacin 10mg  daily, which he thought initially helped his symptoms, however they have currently backed a baseline.  He remains on solifenacin.  He does have limited mobility and amulates with a walker. Denies dysuria or gross hematuria No flank, abdominal or pelvic pain. His wife states he does snore but has done this for several years. No prior sleep apnea testing.   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 Lyle Neal, [place] [facility] in [place]   Intracranial bleed [facility])    december 2010   Left leg DVT (HCC) 09/2019   Migraines    "2 in my life" (09/17/2012)   Mitral regurgitation    a. 01/2019 Echo: EF 55-60%, mild conc LVH. RVSP 59.80mmHg. Mildly dil LA. Mild to mod MR. Mild AI.   PAH (pulmonary artery hypertension) (HCC)    Permanent atrial fibrillation (HCC)    a. CHA2DS2VASc = 5-->No OAC 2/2 h/o SAH; b. 03/2019 Zio: permanent Afib - 78 bpm (46-135). Occas PVCs (1.4% burden).   Retina disorder    hx of torn retina   Stroke (HCC) 2002   LLE "just a little bit weaker" (09/17/2012)   Subarachnoid hemorrhage (HCC) 08/2009    Surgical History: Past Surgical History:  Procedure Laterality Date   BLEPHAROPLASTY  1990's   "right eye" (09/16/2012)   CARDIOVASCULAR STRESS TEST     nl perfusion w/o ischemia, EF 56%, bradycardia without syncope 04/15/10 (Dr. Starlette Ebbs)   CATARACT EXTRACTION W/ INTRAOCULAR LENS IMPLANT  1980's   "right" (09/17/2012)   COLONOSCOPY     06/26/09   EYE MUSCLE SURGERY  1980's   "right eye" (09/17/2012)   HARDWARE REMOVAL Left 05/09/2022   Procedure: HARDWARE REMOVAL LEFT ANKLE;  Surgeon: Rande Bushy, MD;  Location: [facility] ORS;  Service: Orthopedics;  Laterality: Left;   INGUINAL HERNIA REPAIR  04/10/2011   "right" (09/17/2012)   JOINT REPLACEMENT     ORIF ANKLE FRACTURE Left 08/19/2021   Procedure: OPEN REDUCTION INTERNAL FIXATION (ORIF) ANKLE FRACTURE;  Surgeon: Rande Bushy, MD;  Location: [facility] ORS;  Service: Orthopedics;  Laterality: Left;   PERIPHERAL VASCULAR THROMBECTOMY Left 10/27/2019   Procedure: PERIPHERAL VASCULAR THROMBECTOMY;  Surgeon: Celso College, MD;  Location: [facility] INVASIVE CV LAB;  Service: Cardiovascular;  Laterality: Left;   POSTERIOR LUMBAR FUSION  09/16/2012   "L4-5" (09/17/2012)   RETINAL DETACHMENT SURGERY  1980's   "right"  (09/17/2012)   TOTAL KNEE ARTHROPLASTY  04/28/2010   "left" (09/17/2012)    Home Medications:  Allergies as of 01/30/2024       Reactions   Codeine Nausea Only, Other (See Comments)   Fentanyl  Other (See Comments)   Given before knee surgery, blood pressure became extremely low   Regadenoson Other (See Comments)   Created cardiac arrest   Sulfamethoxazole -trimethoprim  Hives   Verapamil  Other (See Comments)   Per patient: Medication has caused low heart rate and caused patient to have afib that he currently has   Acetaminophen  Other (See Comments)   Pt states he feels hot when he takes acetaminophen    Amlodipine  Besylate Other (See Comments)   Low heart rate   Doxycycline Rash        Medication List        Accurate as of Jan 30, 2024  3:12 PM. If you have any questions, ask your nurse or doctor.          acetaminophen  325 MG tablet Commonly known as: TYLENOL  Take 1-2 tablets (325-650 mg total) by mouth every 6 (six) hours as needed for mild pain (pain score 1-3 or temp > 100.5).   aliskiren  300 MG tablet Commonly known as: TEKTURNA  Take 300 mg by mouth daily.   apixaban  2.5 MG Tabs tablet Commonly known as: ELIQUIS  Take 2.5 mg by mouth 2 (two) times daily.   carbamazepine  200 MG tablet Commonly known as: TEGRETOL  Take 200 mg by mouth 2 (two) times a day.   cyanocobalamin  1000 MCG tablet Commonly known as: VITAMIN B12 Take 1,000 mcg by mouth.   Fish Oil 1200 MG Caps Take by mouth.   furosemide  20 MG tablet Commonly known as: LASIX  Take 20 mg by mouth 2 (two) times daily.   irbesartan  300 MG tablet Commonly known as: AVAPRO  Take 300 mg by mouth daily.   pantoprazole  40 MG tablet Commonly known as: PROTONIX  Take 40 mg by mouth at bedtime.   simvastatin  20 MG tablet Commonly known as: ZOCOR  Take 20 mg by mouth 2 (two) times daily.   solifenacin 5 MG tablet Commonly known as: VESICARE Take 5 mg by mouth daily.        Allergies:  Allergies   Allergen Reactions   Codeine Nausea Only and Other (See Comments)   Fentanyl  Other (See Comments)    Given before knee surgery, blood pressure became extremely low   Regadenoson Other (See Comments)    Created cardiac arrest   Sulfamethoxazole -Trimethoprim  Hives   Verapamil  Other (See Comments)    Per patient: Medication has caused low heart rate and caused patient to have afib that he currently has   Acetaminophen  Other (See Comments)    Pt states he feels hot when he takes acetaminophen    Amlodipine  Besylate Other (See Comments)    Low heart rate   Doxycycline Rash  Family History: Family History  Problem Relation Age of Onset   Heart disease Mother     Social History:  reports that he has never smoked. He has never used smokeless tobacco. He reports that he does not drink alcohol and does not use drugs.   Physical Exam: BP 126/80   Pulse 76   Ht 6' (1.829 m)   Wt 208 lb (94.3 kg)   BMI 28.21 kg/m   Constitutional:  Alert and oriented, No acute distress. HEENT: [place] AT Respiratory: Normal respiratory effort, no increased work of breathing. Psychiatric: Normal mood and affect.   Urinalysis Pending   Assessment & Plan:    1. Nocturia PVR today 34 mL He will continue solifenacin and will add Gemtesa 75 mg daily. He was given 4 weeks worth of samples.  If no significant improvement in his nocturia and he desires further treatment, would recommend a sleep study prescribed by or ordered by PCP.   2. Urinary frequency Gemtesa as above.  He will call back regarding efficacy.  I have reviewed the above documentation for accuracy and completeness, and I agree with the above.   Zachary Knapp, MD  [facility] [address] [place], [place] [address] [phone]

## 2024-01-30 NOTE — Patient Instructions (Signed)
 call back regarding efficacy

## 2024-02-12 ENCOUNTER — Encounter (INDEPENDENT_AMBULATORY_CARE_PROVIDER_SITE_OTHER): Payer: Self-pay

## 2024-02-14 ENCOUNTER — Telehealth: Payer: Self-pay

## 2024-02-14 DIAGNOSIS — R351 Nocturia: Secondary | ICD-10-CM

## 2024-02-14 DIAGNOSIS — R350 Frequency of micturition: Secondary | ICD-10-CM

## 2024-02-14 MED ORDER — GEMTESA 75 MG PO TABS: 75.0000 mg | ORAL_TABLET | Freq: Every day | ORAL | 11 refills | Status: AC

## 2024-02-14 NOTE — Telephone Encounter (Signed)
 Pt LM on triage line stating the samples of Gemtesa are working well for him and he would like to proceed with a prescription. RX sent.

## 2024-08-28 ENCOUNTER — Other Ambulatory Visit: Payer: Self-pay | Admitting: Internal Medicine

## 2024-08-28 DIAGNOSIS — I422 Other hypertrophic cardiomyopathy: Secondary | ICD-10-CM

## 2024-10-03 ENCOUNTER — Encounter (HOSPITAL_COMMUNITY): Payer: Self-pay

## 2024-10-06 ENCOUNTER — Other Ambulatory Visit: Payer: Self-pay | Admitting: Internal Medicine

## 2024-10-06 ENCOUNTER — Ambulatory Visit
Admission: RE | Admit: 2024-10-06 | Discharge: 2024-10-06 | Disposition: A | Discharge status: Home / Self Care | Source: Ambulatory Visit | Attending: Internal Medicine | Admitting: Internal Medicine

## 2024-10-06 DIAGNOSIS — I422 Other hypertrophic cardiomyopathy: Secondary | ICD-10-CM

## 2024-10-06 MED ORDER — GADOBUTROL 1 MMOL/ML IV SOLN
12.0000 mL | Freq: Once | INTRAVENOUS | Status: AC | PRN
  Administered 2024-10-06: 12 mL via INTRAVENOUS
